# Patient Record
Sex: Female | Born: 1940
Health system: Southern US, Community
[De-identification: ages and names within clinical notes are randomized; demographics above are authoritative.]

## PROBLEM LIST (undated history)

## (undated) DIAGNOSIS — E785 Hyperlipidemia, unspecified: Secondary | ICD-10-CM

## (undated) DIAGNOSIS — F32A Depression, unspecified: Secondary | ICD-10-CM

## (undated) DIAGNOSIS — Z87442 Personal history of urinary calculi: Secondary | ICD-10-CM

## (undated) DIAGNOSIS — N8110 Cystocele, unspecified: Secondary | ICD-10-CM

## (undated) DIAGNOSIS — M8580 Other specified disorders of bone density and structure, unspecified site: Secondary | ICD-10-CM

## (undated) DIAGNOSIS — F329 Major depressive disorder, single episode, unspecified: Secondary | ICD-10-CM

## (undated) DIAGNOSIS — I10 Essential (primary) hypertension: Secondary | ICD-10-CM

## (undated) DIAGNOSIS — F419 Anxiety disorder, unspecified: Secondary | ICD-10-CM

## (undated) DIAGNOSIS — M791 Myalgia, unspecified site: Secondary | ICD-10-CM

## (undated) DIAGNOSIS — K5792 Diverticulitis of intestine, part unspecified, without perforation or abscess without bleeding: Secondary | ICD-10-CM

## (undated) DIAGNOSIS — K449 Diaphragmatic hernia without obstruction or gangrene: Secondary | ICD-10-CM

## (undated) DIAGNOSIS — B070 Plantar wart: Secondary | ICD-10-CM

## (undated) DIAGNOSIS — K7689 Other specified diseases of liver: Secondary | ICD-10-CM

## (undated) DIAGNOSIS — N281 Cyst of kidney, acquired: Secondary | ICD-10-CM

## (undated) DIAGNOSIS — M25511 Pain in right shoulder: Secondary | ICD-10-CM

## (undated) DIAGNOSIS — D049 Carcinoma in situ of skin, unspecified: Secondary | ICD-10-CM

## (undated) DIAGNOSIS — Z87448 Personal history of other diseases of urinary system: Secondary | ICD-10-CM

## (undated) DIAGNOSIS — M810 Age-related osteoporosis without current pathological fracture: Secondary | ICD-10-CM

## (undated) DIAGNOSIS — I482 Chronic atrial fibrillation, unspecified: Secondary | ICD-10-CM

## (undated) DIAGNOSIS — K5790 Diverticulosis of intestine, part unspecified, without perforation or abscess without bleeding: Secondary | ICD-10-CM

## (undated) DIAGNOSIS — K219 Gastro-esophageal reflux disease without esophagitis: Secondary | ICD-10-CM

## (undated) DIAGNOSIS — K589 Irritable bowel syndrome without diarrhea: Secondary | ICD-10-CM

## (undated) DIAGNOSIS — G4762 Sleep related leg cramps: Secondary | ICD-10-CM

## (undated) DIAGNOSIS — C4492 Squamous cell carcinoma of skin, unspecified: Secondary | ICD-10-CM

## (undated) DIAGNOSIS — M1990 Unspecified osteoarthritis, unspecified site: Secondary | ICD-10-CM

## (undated) DIAGNOSIS — Z8619 Personal history of other infectious and parasitic diseases: Secondary | ICD-10-CM

## (undated) HISTORY — DX: Essential (primary) hypertension: I10

## (undated) HISTORY — DX: Hyperlipidemia, unspecified: E78.5

## (undated) HISTORY — DX: Diverticulitis of intestine, part unspecified, without perforation or abscess without bleeding: K57.92

## (undated) HISTORY — DX: Carcinoma in situ of skin, unspecified: D04.9

## (undated) HISTORY — PX: SKIN CANCER EXCISION: SHX779

## (undated) HISTORY — DX: Diverticulosis of intestine, part unspecified, without perforation or abscess without bleeding: K57.90

## (undated) HISTORY — DX: Irritable bowel syndrome, unspecified: K58.9

## (undated) HISTORY — DX: Unspecified osteoarthritis, unspecified site: M19.90

## (undated) HISTORY — DX: Personal history of other infectious and parasitic diseases: Z86.19

## (undated) HISTORY — DX: Myalgia, unspecified site: M79.10

## (undated) HISTORY — DX: Cystocele, unspecified: N81.10

## (undated) HISTORY — DX: Cyst of kidney, acquired: N28.1

## (undated) HISTORY — DX: Personal history of other diseases of urinary system: Z87.448

## (undated) HISTORY — DX: Depression, unspecified: F32.A

## (undated) HISTORY — DX: Pain in right shoulder: M25.511

## (undated) HISTORY — DX: Squamous cell carcinoma of skin, unspecified: C44.92

## (undated) HISTORY — DX: Gastro-esophageal reflux disease without esophagitis: K21.9

## (undated) HISTORY — DX: Personal history of urinary calculi: Z87.442

## (undated) HISTORY — DX: Diaphragmatic hernia without obstruction or gangrene: K44.9

## (undated) HISTORY — DX: Chronic atrial fibrillation, unspecified: I48.20

## (undated) HISTORY — DX: Anxiety disorder, unspecified: F41.9

## (undated) HISTORY — DX: Other specified disorders of bone density and structure, unspecified site: M85.80

## (undated) HISTORY — DX: Plantar wart: B07.0

## (undated) HISTORY — DX: Other specified diseases of liver: K76.89

## (undated) HISTORY — DX: Major depressive disorder, single episode, unspecified: F32.9

## (undated) HISTORY — PX: SKIN BIOPSY: SHX1

## (undated) HISTORY — DX: Sleep related leg cramps: G47.62

## (undated) HISTORY — DX: Age-related osteoporosis without current pathological fracture: M81.0

## (undated) HISTORY — PX: TONSILLECTOMY: SUR1361

---

## 1998-02-13 ENCOUNTER — Ambulatory Visit (HOSPITAL_COMMUNITY): Admission: RE | Admit: 1998-02-13 | Discharge: 1998-02-13 | Payer: Self-pay | Admitting: Family Medicine

## 1998-03-07 ENCOUNTER — Ambulatory Visit (HOSPITAL_COMMUNITY): Admission: RE | Admit: 1998-03-07 | Discharge: 1998-03-07 | Payer: Self-pay | Admitting: Family Medicine

## 1999-04-11 ENCOUNTER — Ambulatory Visit (HOSPITAL_COMMUNITY): Admission: RE | Admit: 1999-04-11 | Discharge: 1999-04-11 | Payer: Self-pay

## 2000-11-18 ENCOUNTER — Ambulatory Visit (HOSPITAL_COMMUNITY): Admission: RE | Admit: 2000-11-18 | Discharge: 2000-11-18 | Payer: Self-pay | Admitting: Family Medicine

## 2001-06-03 ENCOUNTER — Ambulatory Visit (HOSPITAL_COMMUNITY): Admission: RE | Admit: 2001-06-03 | Discharge: 2001-06-03 | Payer: Self-pay | Admitting: Family Medicine

## 2001-06-03 ENCOUNTER — Encounter: Payer: Self-pay | Admitting: Family Medicine

## 2001-06-10 ENCOUNTER — Ambulatory Visit (HOSPITAL_COMMUNITY): Admission: RE | Admit: 2001-06-10 | Discharge: 2001-06-10 | Payer: Self-pay | Admitting: Family Medicine

## 2001-06-10 ENCOUNTER — Encounter: Payer: Self-pay | Admitting: Family Medicine

## 2001-07-06 ENCOUNTER — Encounter: Payer: Self-pay | Admitting: General Surgery

## 2001-07-06 ENCOUNTER — Ambulatory Visit (HOSPITAL_COMMUNITY): Admission: RE | Admit: 2001-07-06 | Discharge: 2001-07-06 | Payer: Self-pay | Admitting: General Surgery

## 2002-11-14 ENCOUNTER — Ambulatory Visit (HOSPITAL_COMMUNITY): Admission: RE | Admit: 2002-11-14 | Discharge: 2002-11-14 | Payer: Self-pay | Admitting: Family Medicine

## 2002-11-14 ENCOUNTER — Encounter: Payer: Self-pay | Admitting: Family Medicine

## 2002-12-13 ENCOUNTER — Encounter: Payer: Self-pay | Admitting: Family Medicine

## 2002-12-13 ENCOUNTER — Ambulatory Visit (HOSPITAL_COMMUNITY): Admission: RE | Admit: 2002-12-13 | Discharge: 2002-12-13 | Payer: Self-pay | Admitting: Family Medicine

## 2003-02-20 ENCOUNTER — Encounter: Payer: Self-pay | Admitting: General Surgery

## 2003-02-22 ENCOUNTER — Ambulatory Visit (HOSPITAL_COMMUNITY): Admission: RE | Admit: 2003-02-22 | Discharge: 2003-02-23 | Payer: Self-pay | Admitting: General Surgery

## 2003-02-22 ENCOUNTER — Encounter (INDEPENDENT_AMBULATORY_CARE_PROVIDER_SITE_OTHER): Payer: Self-pay | Admitting: Specialist

## 2003-02-22 ENCOUNTER — Encounter: Payer: Self-pay | Admitting: General Surgery

## 2003-06-30 HISTORY — PX: CHOLECYSTECTOMY: SHX55

## 2003-08-31 ENCOUNTER — Encounter: Admission: RE | Admit: 2003-08-31 | Discharge: 2003-08-31 | Payer: Self-pay | Admitting: Family Medicine

## 2004-08-03 ENCOUNTER — Emergency Department (HOSPITAL_COMMUNITY): Admission: EM | Admit: 2004-08-03 | Discharge: 2004-08-03 | Payer: Self-pay | Admitting: Emergency Medicine

## 2007-09-06 ENCOUNTER — Encounter: Admission: RE | Admit: 2007-09-06 | Discharge: 2007-09-06 | Payer: Self-pay | Admitting: Family Medicine

## 2007-09-09 ENCOUNTER — Ambulatory Visit (HOSPITAL_COMMUNITY): Admission: RE | Admit: 2007-09-09 | Discharge: 2007-09-09 | Payer: Self-pay | Admitting: Family Medicine

## 2008-09-18 ENCOUNTER — Ambulatory Visit (HOSPITAL_COMMUNITY): Admission: RE | Admit: 2008-09-18 | Discharge: 2008-09-18 | Payer: Self-pay | Admitting: Family Medicine

## 2008-10-27 LAB — HM COLONOSCOPY: HM Colonoscopy: NORMAL

## 2010-11-14 NOTE — Op Note (Signed)
Sophia [name], Sophia [name]                        ACCOUNT NO.:  [id number]   MEDICAL RECORD NO.:  [id number]                   PATIENT TYPE:  OIB   LOCATION:  2894                                 FACILITY:  [facility]   PHYSICIAN:  Adolph Pollack, M.D.            DATE OF BIRTH:  [birth year]   DATE OF PROCEDURE:  02/22/2003  DATE OF DISCHARGE:                                 OPERATIVE REPORT   PREOPERATIVE DIAGNOSIS:  Biliary dyskinesia.   POSTOPERATIVE DIAGNOSIS:  Chronic cholecystitis and biliary dyskinesia.   PROCEDURE:  Laparoscopic cholecystectomy with intraoperative cholangiogram.   SURGEON:  Adolph Pollack, M.D.   ASSISTANT:  Ollen Gross. Carolynne Edouard, M.D.   ANESTHESIA:  General.   FINDINGS:  On the intraoperative cholangiogram there appeared to be an  irregular defect in the lumen of the common hepatic duct, and this was  confirmed by Dr. Kearney Hard.  It appeared to be suspicious for sludge.  It was  not obstructive.   INDICATIONS:  This is a 70 year old female who is having some epigastric  type pains following foods, was given some Zantac about a year and a half  ago, and this had improved the symptoms somewhat; however, they have  returned.  Zantac no longer helps with these pains, and they are biliary  colic in nature by history.  She has had an ultrasound in the past, which  demonstrated some gallbladder sludge, and a HIDA scan consistent with some  biliary dyskinesia.  Liver function tests are normal.  She now presents for  elective cholecystectomy.  The procedure, rationale, and risks were  explained to her.   TECHNIQUE:  She is seen in the holding area, then brought to the operating  room, placed supine on the operating table, and a general anesthetic was  administered.  Her abdomen was sterilely prepped and draped.  Dilute  Marcaine solution was infiltrated into the subumbilical tissues and then a  small incision was made through the skin and subcutaneous tissue until the  fascia was identified.  A small incision was made in the midline fascia.  Using blunt dissection the peritoneal cavity was entered.  A pursestring  suture of 0 Vicryl was placed around the fascial edges.  A Hasson trocar is  introduced into the peritoneal cavity and a pneumoperitoneum was created by  insufflation of CO2 gas.  Next the laparoscope was introduced.  No  underlying visceral injury was noted.   She is placed in reverse Trendelenburg position in slight right lateral  decubitus.  An 11 mm trocar is placed through an epigastric incision and two  5 mm trocars were placed in the right midlateral abdomen.  There were filmy  adhesions between the omentum and the fundus and body of the gallbladder,  which were taken down bluntly.  The fundus was then able to be retracted  toward the right shoulder.  I continued to take down these filmy adhesions  between the omentum and the gallbladder until we could grasp the  infundibulum.  Using blunt dissection and Select cautery, I then mobilized  the infundibulum.  I identified the cystic duct and what appeared to be an  anterior branch of the cystic artery going directly to the gallbladder.  Using blunt dissection, a window was recreated around both of these.  A clip  was then placed above the cystic duct-gallbladder junction and I made a  small incision at the cystic duct-gallbladder junction.  A cholangiocatheter  was passed into the cystic duct and a cholangiogram performed.   Under real-time fluoroscopy dilute contrast material was injected into the  cystic duct.  It appeared to be a fairly long cystic duct.  The common bile  duct filled promptly and drained promptly into the duodenum.  The common  hepatic duct filled as well but had an irregular defect that was somewhat  mobile in it.  I flushed the biliary system with saline and repeated the  cholangiogram, and again the defect was noted.  I had Dr. Charlett Nose look  at it, and he saw the  defect as well and thought it was consistent with  sludge.  She did not have any evidence of obstruction of the biliary system.   I subsequently removed the Cholangiocath.  I clipped the cystic duct three  times on the staying-in side and dividing it.  The anterior branch of the  cystic artery was clipped and divided, and the posterior branch was  identified and clipped and divided.  I then dissected the gallbladder free  from the liver bed using electrocautery.  A small puncture wound was placed  in the gallbladder, and we had some bile leakage but no evidence of sludge  or stones.  Once the gallbladder was dissected free from the liver bed, it  was placed in an Endopouch bag.   Then I copiously irrigated out the gallbladder fossa.  Bleeding points were  controlled with cautery.  I inspected this multiple other times and  irrigated until the fluid was clear.  There was no evidence of bleeding or  bile leakage.   I then removed the gallbladder in the Endopouch bag through the subumbilical  port.  I irrigated the perihepatic area once again and evacuated the fluid,  and the fluid was clear.  Under direct vision I closed the subumbilical  fascial defect by tightening up and tying down the pursestring suture.  I  then removed the remaining trocars and released the pneumoperitoneum.  The  skin incisions were closed with 4-0 Monocryl subcuticular stitches, followed  by Steri-Strips and sterile dressings.  I then went to the back table and  cut open the gallbladder and saw no evidence of sludge or stones grossly.   She tolerated the procedure well without any apparent complications and was  taken to the recovery room in satisfactory condition.  The plan now will be  just to observe her and if she has any other symptoms suggestive of biliary  colic, we may need MRCP or ERCP in the postoperative period.                                              Adolph Pollack, M.D.    Kari Baars  D:   02/22/2003  T:  02/22/2003  Job:  [id number]   cc:  Talmadge Coventry, M.D.  [address]  [place]  [place] [address]  Fax: [phone]

## 2014-06-26 ENCOUNTER — Ambulatory Visit (INDEPENDENT_AMBULATORY_CARE_PROVIDER_SITE_OTHER): Payer: Commercial Managed Care - HMO | Admitting: Family Medicine

## 2014-06-26 ENCOUNTER — Encounter: Payer: Self-pay | Admitting: Family Medicine

## 2014-06-26 VITALS — BP 136/80 | HR 66 | Temp 97.7°F | Ht 60.75 in | Wt 133.8 lb

## 2014-06-26 DIAGNOSIS — C4492 Squamous cell carcinoma of skin, unspecified: Secondary | ICD-10-CM | POA: Insufficient documentation

## 2014-06-26 DIAGNOSIS — Z8619 Personal history of other infectious and parasitic diseases: Secondary | ICD-10-CM | POA: Insufficient documentation

## 2014-06-26 DIAGNOSIS — E782 Mixed hyperlipidemia: Secondary | ICD-10-CM

## 2014-06-26 DIAGNOSIS — M8580 Other specified disorders of bone density and structure, unspecified site: Secondary | ICD-10-CM

## 2014-06-26 DIAGNOSIS — F329 Major depressive disorder, single episode, unspecified: Secondary | ICD-10-CM | POA: Insufficient documentation

## 2014-06-26 DIAGNOSIS — F32A Depression, unspecified: Secondary | ICD-10-CM | POA: Insufficient documentation

## 2014-06-26 DIAGNOSIS — K219 Gastro-esophageal reflux disease without esophagitis: Secondary | ICD-10-CM | POA: Insufficient documentation

## 2014-06-26 DIAGNOSIS — M810 Age-related osteoporosis without current pathological fracture: Secondary | ICD-10-CM

## 2014-06-26 DIAGNOSIS — Z87442 Personal history of urinary calculi: Secondary | ICD-10-CM | POA: Insufficient documentation

## 2014-06-26 DIAGNOSIS — F419 Anxiety disorder, unspecified: Secondary | ICD-10-CM

## 2014-06-26 DIAGNOSIS — K5790 Diverticulosis of intestine, part unspecified, without perforation or abscess without bleeding: Secondary | ICD-10-CM | POA: Insufficient documentation

## 2014-06-26 DIAGNOSIS — D049 Carcinoma in situ of skin, unspecified: Secondary | ICD-10-CM | POA: Insufficient documentation

## 2014-06-26 DIAGNOSIS — E785 Hyperlipidemia, unspecified: Secondary | ICD-10-CM

## 2014-06-26 DIAGNOSIS — Z87448 Personal history of other diseases of urinary system: Secondary | ICD-10-CM | POA: Insufficient documentation

## 2014-06-26 DIAGNOSIS — I10 Essential (primary) hypertension: Secondary | ICD-10-CM | POA: Insufficient documentation

## 2014-06-26 DIAGNOSIS — M1990 Unspecified osteoarthritis, unspecified site: Secondary | ICD-10-CM | POA: Insufficient documentation

## 2014-06-26 HISTORY — DX: Age-related osteoporosis without current pathological fracture: M81.0

## 2014-06-26 HISTORY — DX: Other specified disorders of bone density and structure, unspecified site: M85.80

## 2014-06-26 MED ORDER — ALPRAZOLAM 0.25 MG PO TABS: 0.2500 mg | ORAL_TABLET | Freq: Every day | ORAL | Status: DC | PRN

## 2014-06-26 NOTE — Patient Instructions (Signed)

## 2014-06-26 NOTE — Progress Notes (Signed)
Pre visit review using our clinic review tool, if applicable. No additional management support is needed unless otherwise documented below in the visit note. 

## 2014-07-04 NOTE — Assessment & Plan Note (Signed)
Well controlled, no changes to meds. Encouraged heart healthy diet such as the DASH diet and exercise as tolerated.  °

## 2014-07-04 NOTE — Progress Notes (Signed)
Sophia [name]  [id number] 1941/06/03 07/04/2014      Progress Note-Follow Up  Subjective  Chief Complaint  Chief Complaint  Patient presents with  . Establish Care    bladder issues-pressure and freq urination-sxs last 6 mos    HPI  Patient is a 74 y.o. female in today for routine medical care. She is today to establish care. She is in her usual state of health and offers no acute concerns at this time. She is in need of a primary care physician. Recent illness. Denies CP/palp/SOB/HA/congestion/fevers/GI or GU c/o. Taking meds as prescribed  Past Medical History  Diagnosis Date  . Arthritis     hands, knees, etc  . GERD (gastroesophageal reflux disease)   . Hypertension   . Hyperlipidemia   . History of kidney stones   . H/O hematuria     for several years, work only revealed kidney stone on right side  . Diverticulitis   . History of chicken pox   . Anxiety and depression   . SCC (squamous cell carcinoma)     arms  . Bowen's disease   . Osteopenia 06/26/2014    Past Surgical History  Procedure Laterality Date  . Cholecystectomy  2005  . Skin biopsy      multiple    Family History  Problem Relation Age of Onset  . Heart disease Mother     aortic stenosis  . Hyperlipidemia Mother   . Hypertension Mother   . Heart attack Mother   . Cancer Mother     BCC, SCC, skin  . Hypertension Father   . Cancer Father     SCC, BCC, skin  . Early death Daughter   . Birth defects Maternal Uncle   . Heart disease Maternal Grandfather     MI  . Tuberculosis Paternal Grandmother   . Cancer Paternal Grandfather     head and neck cancer, chewed tobacco  . Hypertension Daughter   . Hypertension Brother   . Hyperlipidemia Brother     History   Social History  . Marital Status: Single    Spouse Name: N/A    Number of Children: N/A  . Years of Education: N/A   Occupational History  . Not on file.   Social History Main Topics  . Smoking status: Never Smoker     . Smokeless tobacco: Not on file  . Alcohol Use: No  . Drug Use: No  . Sexual Activity: No     Comment: divorced, widowed, lives with elderly parents, is the caregiver, no dietary restrictions.    Other Topics Concern  . Not on file   Social History Narrative  . No narrative on file    No current outpatient prescriptions on file prior to visit.   No current facility-administered medications on file prior to visit.    Allergies  Allergen Reactions  . Statins Other (See Comments)    Myalgias, weakness    Review of Systems  Review of Systems  Constitutional: Negative for fever, chills and malaise/fatigue.  HENT: Negative for congestion, hearing loss and nosebleeds.   Eyes: Negative for discharge.  Respiratory: Negative for cough, sputum production, shortness of breath and wheezing.   Cardiovascular: Negative for chest pain, palpitations and leg swelling.  Gastrointestinal: Negative for heartburn, nausea, vomiting, abdominal pain, diarrhea, constipation and blood in stool.  Genitourinary: Negative for dysuria, urgency, frequency and hematuria.  Musculoskeletal: Negative for myalgias, back pain and falls.  Skin: Negative for rash.  Neurological: Negative for dizziness, tremors, sensory change, focal weakness, loss of consciousness, weakness and headaches.  Endo/Heme/Allergies: Negative for polydipsia. Does not bruise/bleed easily.  Psychiatric/Behavioral: Positive for depression. Negative for suicidal ideas. The patient is nervous/anxious. The patient does not have insomnia.     Objective  BP 136/80 mmHg  Pulse 66  Temp(Src) 97.7 F (36.5 C) (Oral)  Ht 5' 0.75" (1.543 m)  Wt 133 lb 12.8 oz (60.691 kg)  BMI 25.49 kg/m2  SpO2 99%  Physical Exam  Physical Exam  Constitutional: She is oriented to person, place, and time and well-developed, well-nourished, and in no distress. No distress.  HENT:  Head: Normocephalic and atraumatic.  Right Ear: External ear normal.   Left Ear: External ear normal.  Nose: Nose normal.  Mouth/Throat: Oropharynx is clear and moist. No oropharyngeal exudate.  Eyes: Conjunctivae are normal. Pupils are equal, round, and reactive to light. Right eye exhibits no discharge. Left eye exhibits no discharge. No scleral icterus.  Neck: Normal range of motion. Neck supple. No thyromegaly present.  Cardiovascular: Normal rate, regular rhythm, normal heart sounds and intact distal pulses.   No murmur heard. Pulmonary/Chest: Effort normal and breath sounds normal. No respiratory distress. She has no wheezes. She has no rales.  Abdominal: Soft. Bowel sounds are normal. She exhibits no distension and no mass. There is no tenderness.  Musculoskeletal: Normal range of motion. She exhibits no edema or tenderness.  Lymphadenopathy:    She has no cervical adenopathy.  Neurological: She is alert and oriented to person, place, and time. She has normal reflexes. No cranial nerve deficit. Coordination normal.  Skin: Skin is warm and dry. No rash noted. She is not diaphoretic.  Psychiatric: Mood, memory and affect normal.    No results found for: TSH No results found for: WBC, HGB, HCT, MCV, PLT No results found for: CREATININE, BUN, NA, K, CL, CO2 No results found for: ALT, AST, GGT, ALKPHOS, BILITOT No results found for: CHOL No results found for: HDL No results found for: LDLCALC No results found for: TRIG No results found for: CHOLHDL   Assessment & Plan  Hypertension Well controlled, no changes to meds. Encouraged heart healthy diet such as the DASH diet and exercise as tolerated.   Hyperlipidemia Encouraged heart healthy diet, increase exercise, avoid trans fats, consider a krill oil cap daily  Osteopenia Continue Citracal and Vitamin D supplements.

## 2014-07-04 NOTE — Assessment & Plan Note (Signed)
Encouraged heart healthy diet, increase exercise, avoid trans fats, consider a krill oil cap daily 

## 2014-07-04 NOTE — Assessment & Plan Note (Signed)
Continue Citracal and Vitamin D supplements.

## 2014-08-10 ENCOUNTER — Telehealth: Payer: Self-pay | Admitting: Family Medicine

## 2014-08-10 MED ORDER — CITALOPRAM HYDROBROMIDE 10 MG PO TABS: 10.0000 mg | ORAL_TABLET | Freq: Every day | ORAL | Status: DC

## 2014-08-10 NOTE — Telephone Encounter (Signed)
Prescription sent in as requested and called the patient to inform prescription sent in as requested.

## 2014-08-10 NOTE — Telephone Encounter (Signed)
Caller name: Penina Relation to pt: Call back number: [phone] Pharmacy: [facility] on [address]  Reason for call:   Requesting refill of citalopram

## 2014-08-30 ENCOUNTER — Other Ambulatory Visit (INDEPENDENT_AMBULATORY_CARE_PROVIDER_SITE_OTHER): Payer: Commercial Managed Care - HMO

## 2014-08-30 DIAGNOSIS — I10 Essential (primary) hypertension: Secondary | ICD-10-CM | POA: Diagnosis not present

## 2014-08-30 DIAGNOSIS — M8580 Other specified disorders of bone density and structure, unspecified site: Secondary | ICD-10-CM

## 2014-08-30 DIAGNOSIS — E782 Mixed hyperlipidemia: Secondary | ICD-10-CM | POA: Diagnosis not present

## 2014-08-30 DIAGNOSIS — C4492 Squamous cell carcinoma of skin, unspecified: Secondary | ICD-10-CM

## 2014-08-30 LAB — COMPLETE METABOLIC PANEL WITH GFR
ALT: 16 U/L (ref 0–35)
AST: 22 U/L (ref 0–37)
Albumin: 4.3 g/dL (ref 3.5–5.2)
Alkaline Phosphatase: 58 U/L (ref 39–117)
BUN: 9 mg/dL (ref 6–23)
CALCIUM: 9.6 mg/dL (ref 8.4–10.5)
CHLORIDE: 103 meq/L (ref 96–112)
CO2: 27 meq/L (ref 19–32)
CREATININE: 0.74 mg/dL (ref 0.50–1.10)
GFR, Est Non African American: 81 mL/min
Glucose, Bld: 87 mg/dL (ref 70–99)
Potassium: 4.5 mEq/L (ref 3.5–5.3)
Sodium: 139 mEq/L (ref 135–145)
TOTAL PROTEIN: 6.4 g/dL (ref 6.0–8.3)
Total Bilirubin: 0.7 mg/dL (ref 0.2–1.2)

## 2014-08-30 LAB — LIPID PANEL
CHOLESTEROL: 242 mg/dL — AB (ref 0–200)
HDL: 60.2 mg/dL (ref >39.00)
LDL Cholesterol: 149 mg/dL — ABNORMAL HIGH (ref 0–99)
NonHDL: 181.8
Total CHOL/HDL Ratio: 4
Triglycerides: 163 mg/dL — ABNORMAL HIGH (ref 0.0–149.0)
VLDL: 32.6 mg/dL (ref 0.0–40.0)

## 2014-08-30 LAB — CBC
HEMATOCRIT: 38.6 % (ref 36.0–46.0)
Hemoglobin: 13.1 g/dL (ref 12.0–15.0)
MCHC: 33.9 g/dL (ref 30.0–36.0)
MCV: 91.7 fl (ref 78.0–100.0)
Platelets: 169 10*3/uL (ref 150.0–400.0)
RBC: 4.21 Mil/uL (ref 3.87–5.11)
RDW: 14.3 % (ref 11.5–15.5)
WBC: 6.8 10*3/uL (ref 4.0–10.5)

## 2014-08-30 LAB — TSH: TSH: 2.68 u[IU]/mL (ref 0.35–4.50)

## 2014-08-30 LAB — VITAMIN D 25 HYDROXY (VIT D DEFICIENCY, FRACTURES): VITD: 34.74 ng/mL (ref 30.00–100.00)

## 2014-09-06 ENCOUNTER — Ambulatory Visit: Payer: Commercial Managed Care - HMO | Admitting: Family Medicine

## 2014-09-10 ENCOUNTER — Encounter: Payer: Self-pay | Admitting: Family Medicine

## 2014-09-10 ENCOUNTER — Ambulatory Visit (INDEPENDENT_AMBULATORY_CARE_PROVIDER_SITE_OTHER): Payer: Commercial Managed Care - HMO | Admitting: Family Medicine

## 2014-09-10 VITALS — BP 118/80 | HR 76 | Temp 98.0°F | Ht 61.0 in | Wt 138.0 lb

## 2014-09-10 DIAGNOSIS — Z87442 Personal history of urinary calculi: Secondary | ICD-10-CM

## 2014-09-10 DIAGNOSIS — K219 Gastro-esophageal reflux disease without esophagitis: Secondary | ICD-10-CM | POA: Diagnosis not present

## 2014-09-10 DIAGNOSIS — Z87448 Personal history of other diseases of urinary system: Secondary | ICD-10-CM | POA: Diagnosis not present

## 2014-09-10 DIAGNOSIS — N8110 Cystocele, unspecified: Secondary | ICD-10-CM | POA: Diagnosis not present

## 2014-09-10 DIAGNOSIS — F329 Major depressive disorder, single episode, unspecified: Secondary | ICD-10-CM

## 2014-09-10 DIAGNOSIS — F418 Other specified anxiety disorders: Secondary | ICD-10-CM

## 2014-09-10 DIAGNOSIS — E785 Hyperlipidemia, unspecified: Secondary | ICD-10-CM

## 2014-09-10 DIAGNOSIS — I10 Essential (primary) hypertension: Secondary | ICD-10-CM

## 2014-09-10 DIAGNOSIS — F419 Anxiety disorder, unspecified: Secondary | ICD-10-CM

## 2014-09-10 MED ORDER — CITALOPRAM HYDROBROMIDE 10 MG PO TABS: 10.0000 mg | ORAL_TABLET | Freq: Every day | ORAL | Status: DC

## 2014-09-10 MED ORDER — CARVEDILOL 3.125 MG PO TABS: 3.1250 mg | ORAL_TABLET | Freq: Two times a day (BID) | ORAL | Status: DC

## 2014-09-10 NOTE — Progress Notes (Signed)
Pre visit review using our clinic review tool, if applicable. No additional management support is needed unless otherwise documented below in the visit note. 

## 2014-09-10 NOTE — Patient Instructions (Addendum)
Add a fiber supplement such as Metamucil or Benefiber  Add a probiotic, switch to Digestive Advantage or [employer]'s Colon Health   Hypertension Hypertension, commonly called high blood pressure, is when the force of blood pumping through your arteries is too strong. Your arteries are the blood vessels that carry blood from your heart throughout your body. A blood pressure reading consists of a higher number over a lower number, such as 110/72. The higher number (systolic) is the pressure inside your arteries when your heart pumps. The lower number (diastolic) is the pressure inside your arteries when your heart relaxes. Ideally you want your blood pressure below 120/80. Hypertension forces your heart to work harder to pump blood. Your arteries may become narrow or stiff. Having hypertension puts you at risk for heart disease, stroke, and other problems.  RISK FACTORS Some risk factors for high blood pressure are controllable. Others are not.  Risk factors you cannot control include:   Race. You may be at higher risk if you are African American.  Age. Risk increases with age.  Gender. Men are at higher risk than women before age 82 years. After age 64, women are at higher risk than men. Risk factors you can control include:  Not getting enough exercise or physical activity.  Being overweight.  Getting too much fat, sugar, calories, or salt in your diet.  Drinking too much alcohol. SIGNS AND SYMPTOMS Hypertension does not usually cause signs or symptoms. Extremely high blood pressure (hypertensive crisis) may cause headache, anxiety, shortness of breath, and nosebleed. DIAGNOSIS  To check if you have hypertension, your health care provider will measure your blood pressure while you are seated, with your arm held at the level of your heart. It should be measured at least twice using the same arm. Certain conditions can cause a difference in blood pressure between your right and left arms. A  blood pressure reading that is higher than normal on one occasion does not mean that you need treatment. If one blood pressure reading is high, ask your health care provider about having it checked again. TREATMENT  Treating high blood pressure includes making lifestyle changes and possibly taking medicine. Living a healthy lifestyle can help lower high blood pressure. You may need to change some of your habits. Lifestyle changes may include:  Following the DASH diet. This diet is high in fruits, vegetables, and whole grains. It is low in salt, red meat, and added sugars.  Getting at least 2 hours of brisk physical activity every week.  Losing weight if necessary.  Not smoking.  Limiting alcoholic beverages.  Learning ways to reduce stress. If lifestyle changes are not enough to get your blood pressure under control, your health care provider may prescribe medicine. You may need to take more than one. Work closely with your health care provider to understand the risks and benefits. HOME CARE INSTRUCTIONS  Have your blood pressure rechecked as directed by your health care provider.   Take medicines only as directed by your health care provider. Follow the directions carefully. Blood pressure medicines must be taken as prescribed. The medicine does not work as well when you skip doses. Skipping doses also puts you at risk for problems.   Do not smoke.   Monitor your blood pressure at home as directed by your health care provider. SEEK MEDICAL CARE IF:   You think you are having a reaction to medicines taken.  You have recurrent headaches or feel dizzy.  You have swelling  in your ankles.  You have trouble with your vision. SEEK IMMEDIATE MEDICAL CARE IF:  You develop a severe headache or confusion.  You have unusual weakness, numbness, or feel faint.  You have severe chest or abdominal pain.  You vomit repeatedly.  You have trouble breathing. MAKE SURE YOU:    Understand these instructions.  Will watch your condition.  Will get help right away if you are not doing well or get worse. Document Released: 06/15/2005 Document Revised: 10/30/2013 Document Reviewed: 04/07/2013 [facility] Patient Information 2015 [place], [place]. This information is not intended to replace advice given to you by your health care provider. Make sure you discuss any questions you have with your health care provider.

## 2014-09-11 ENCOUNTER — Other Ambulatory Visit: Payer: Self-pay | Admitting: Family Medicine

## 2014-09-16 ENCOUNTER — Encounter: Payer: Self-pay | Admitting: Family Medicine

## 2014-09-16 DIAGNOSIS — N8110 Cystocele, unspecified: Secondary | ICD-10-CM

## 2014-09-16 HISTORY — DX: Cystocele, unspecified: N81.10

## 2014-09-16 NOTE — Assessment & Plan Note (Signed)
Patient reports 20 year plus history of hematuria.

## 2014-09-16 NOTE — Assessment & Plan Note (Signed)
Symptomatic with lifting and bending. Discussed possibility of referral to urology or gyn for pessary but declines for now.

## 2014-09-16 NOTE — Assessment & Plan Note (Signed)
Avoid offending foods, start probiotics. Do not eat large meals in late evening and consider raising head of bed.  

## 2014-09-16 NOTE — Assessment & Plan Note (Signed)
Well controlled, no changes to meds. Encouraged heart healthy diet such as the DASH diet and exercise as tolerated.  °

## 2014-09-16 NOTE — Progress Notes (Signed)
Sophia [name]  [id number] 08/04/1940 09/16/2014      Progress Note-Follow Up  Subjective  Chief Complaint  Chief Complaint  Patient presents with  . Follow-up    HPI  Patient is a 75 y.o. female in today for routine medical care. Patient is in today for follow-up. Feels well. No recent illness. Feels the Celexa has been helpful. She has not needed any of the alprazolam. She denies any suicidal ideation. Her major complaint is of recurrent trouble with bladder prolapse. She notes feeling something protruding when she lifts and bends. She denies any dysuria with certain position she has trouble initiating urine. This is not ongoing and corrects with position changes. No recent illness or fevers. Denies CP/palp/SOB/HA/congestion/fevers/GI or GU c/o. Taking meds as prescribed  Past Medical History  Diagnosis Date  . Arthritis     hands, knees, etc  . GERD (gastroesophageal reflux disease)   . Hypertension   . Hyperlipidemia   . History of kidney stones   . H/O hematuria     for several years, work only revealed kidney stone on right side  . Diverticulitis   . History of chicken pox   . Anxiety and depression   . SCC (squamous cell carcinoma)     arms  . Bowen's disease   . Osteopenia 06/26/2014  . Bladder prolapse, female, acquired 09/16/2014    Past Surgical History  Procedure Laterality Date  . Cholecystectomy  2005  . Skin biopsy      multiple    Family History  Problem Relation Age of Onset  . Heart disease Mother     aortic stenosis  . Hyperlipidemia Mother   . Hypertension Mother   . Heart attack Mother   . Cancer Mother     BCC, SCC, skin  . Hypertension Father   . Cancer Father     SCC, BCC, skin  . Early death Daughter   . Birth defects Maternal Uncle   . Heart disease Maternal Grandfather     MI  . Tuberculosis Paternal Grandmother   . Cancer Paternal Grandfather     head and neck cancer, chewed tobacco  . Hypertension Daughter   .  Hypertension Brother   . Hyperlipidemia Brother     History   Social History  . Marital Status: Single    Spouse Name: N/A  . Number of Children: N/A  . Years of Education: N/A   Occupational History  . Not on file.   Social History Main Topics  . Smoking status: Never Smoker   . Smokeless tobacco: Not on file  . Alcohol Use: No  . Drug Use: No  . Sexual Activity: No     Comment: divorced, widowed, lives with elderly parents, is the caregiver, no dietary restrictions.    Other Topics Concern  . Not on file   Social History Narrative    Current Outpatient Prescriptions on File Prior to Visit  Medication Sig Dispense Refill  . aspirin 81 MG tablet Take 81 mg by mouth 3 (three) times a week.    . B Complex-C (SUPER B COMPLEX) TABS Take 1 tablet by mouth daily.    . Coenzyme Q10 (COQ-10) 200 MG CAPS Take 1 capsule by mouth daily.    . Omega-3 Fatty Acids (FISH OIL) 1200 MG CAPS Take 1 capsule by mouth 2 (two) times daily.    [name] OVER THE COUNTER MEDICATION Citrical with Vit D3 630mg /500IU-Take 1 capsule by mouth daily.    [name]  Red Yeast Rice 600 MG CAPS Take 1 capsule by mouth daily.    [name] ALPRAZolam (XANAX) 0.25 MG tablet Take 1 tablet (0.25 mg total) by mouth daily as needed for anxiety or sleep. (Patient not taking: Reported on 09/10/2014) 30 tablet 1   No current facility-administered medications on file prior to visit.    Allergies  Allergen Reactions  . Morphine And Related Swelling  . Statins Other (See Comments)    Myalgias, weakness  . Sulfa Antibiotics Swelling and Rash    Review of Systems  Review of Systems  Constitutional: Negative for fever and malaise/fatigue.  HENT: Negative for congestion.   Eyes: Negative for discharge.  Respiratory: Negative for shortness of breath.   Cardiovascular: Negative for chest pain, palpitations and leg swelling.  Gastrointestinal: Negative for nausea, abdominal pain and diarrhea.  Genitourinary: Positive for urgency and  frequency. Negative for dysuria, hematuria and flank pain.  Musculoskeletal: Negative for falls.  Skin: Negative for rash.  Neurological: Negative for loss of consciousness and headaches.  Endo/Heme/Allergies: Negative for polydipsia.  Psychiatric/Behavioral: Negative for depression and suicidal ideas. The patient is not nervous/anxious and does not have insomnia.     Objective  BP 118/80 mmHg  Pulse 76  Temp(Src) 98 F (36.7 C) (Oral)  Ht 5\' 1"  (1.549 m)  Wt 138 lb (62.596 kg)  BMI 26.09 kg/m2  SpO2 95%  Physical Exam  Physical Exam  Constitutional: She is oriented to person, place, and time and well-developed, well-nourished, and in no distress. No distress.  HENT:  Head: Normocephalic and atraumatic.  Eyes: Conjunctivae are normal.  Neck: Neck supple. No thyromegaly present.  Cardiovascular: Normal rate, regular rhythm and normal heart sounds.   No murmur heard. Pulmonary/Chest: Effort normal and breath sounds normal. She has no wheezes.  Abdominal: She exhibits no distension and no mass.  Musculoskeletal: She exhibits no edema.  Lymphadenopathy:    She has no cervical adenopathy.  Neurological: She is alert and oriented to person, place, and time.  Skin: Skin is warm and dry. No rash noted. She is not diaphoretic.  Psychiatric: Memory, affect and judgment normal.    Lab Results  Component Value Date   TSH 2.68 08/30/2014   Lab Results  Component Value Date   WBC 6.8 08/30/2014   HGB 13.1 08/30/2014   HCT 38.6 08/30/2014   MCV 91.7 08/30/2014   PLT 169.0 08/30/2014   Lab Results  Component Value Date   CREATININE 0.74 08/30/2014   BUN 9 08/30/2014   NA 139 08/30/2014   K 4.5 08/30/2014   CL 103 08/30/2014   CO2 27 08/30/2014   Lab Results  Component Value Date   ALT 16 08/30/2014   AST 22 08/30/2014   ALKPHOS 58 08/30/2014   BILITOT 0.7 08/30/2014   Lab Results  Component Value Date   CHOL 242* 08/30/2014   Lab Results  Component Value Date     HDL 60.20 08/30/2014   Lab Results  Component Value Date   LDLCALC 149* 08/30/2014   Lab Results  Component Value Date   TRIG 163.0* 08/30/2014   Lab Results  Component Value Date   CHOLHDL 4 08/30/2014     Assessment & Plan  Hypertension Well controlled, no changes to meds. Encouraged heart healthy diet such as the DASH diet and exercise as tolerated.    Anxiety and depression Celexa 10 mg daily, doing well   Hyperlipidemia Encouraged heart healthy diet, increase exercise, avoid trans fats, consider a krill  oil cap daily. May continue Red Yeast Rice and may need to consider statins in the future   GERD (gastroesophageal reflux disease) Avoid offending foods, start probiotics. Do not eat large meals in late evening and consider raising head of bed.    History of kidney stones Staying well hydrated, no recent symptoms   H/O hematuria Patient reports 20 year plus history of hematuria.    Bladder prolapse, female, acquired Symptomatic with lifting and bending. Discussed possibility of referral to urology or gyn for pessary but declines for now.

## 2014-09-16 NOTE — Assessment & Plan Note (Signed)
Staying well hydrated, no recent symptoms

## 2014-09-16 NOTE — Assessment & Plan Note (Signed)
Celexa 10 mg daily, doing well

## 2014-09-16 NOTE — Assessment & Plan Note (Signed)
Encouraged heart healthy diet, increase exercise, avoid trans fats, consider a krill oil cap daily. May continue Red Yeast Rice and may need to consider statins in the future

## 2014-09-27 DIAGNOSIS — N814 Uterovaginal prolapse, unspecified: Secondary | ICD-10-CM | POA: Diagnosis not present

## 2014-09-27 DIAGNOSIS — N8111 Cystocele, midline: Secondary | ICD-10-CM | POA: Diagnosis not present

## 2014-10-12 DIAGNOSIS — N814 Uterovaginal prolapse, unspecified: Secondary | ICD-10-CM | POA: Diagnosis not present

## 2014-10-12 DIAGNOSIS — N8111 Cystocele, midline: Secondary | ICD-10-CM | POA: Diagnosis not present

## 2014-11-05 ENCOUNTER — Other Ambulatory Visit: Payer: Self-pay | Admitting: Family Medicine

## 2014-11-05 MED ORDER — CARVEDILOL 3.125 MG PO TABS: 3.1250 mg | ORAL_TABLET | Freq: Two times a day (BID) | ORAL | Status: DC

## 2014-11-12 DIAGNOSIS — L851 Acquired keratosis [keratoderma] palmaris et plantaris: Secondary | ICD-10-CM | POA: Diagnosis not present

## 2014-11-12 DIAGNOSIS — D234 Other benign neoplasm of skin of scalp and neck: Secondary | ICD-10-CM | POA: Diagnosis not present

## 2014-12-13 ENCOUNTER — Ambulatory Visit (INDEPENDENT_AMBULATORY_CARE_PROVIDER_SITE_OTHER): Payer: Commercial Managed Care - HMO | Admitting: Family Medicine

## 2014-12-13 ENCOUNTER — Encounter: Payer: Self-pay | Admitting: Family Medicine

## 2014-12-13 VITALS — BP 110/78 | HR 76 | Temp 98.1°F | Ht 61.0 in | Wt 139.5 lb

## 2014-12-13 DIAGNOSIS — I10 Essential (primary) hypertension: Secondary | ICD-10-CM | POA: Diagnosis not present

## 2014-12-13 DIAGNOSIS — E782 Mixed hyperlipidemia: Secondary | ICD-10-CM

## 2014-12-13 DIAGNOSIS — M8580 Other specified disorders of bone density and structure, unspecified site: Secondary | ICD-10-CM | POA: Diagnosis not present

## 2014-12-13 DIAGNOSIS — D049 Carcinoma in situ of skin, unspecified: Secondary | ICD-10-CM

## 2014-12-13 DIAGNOSIS — E785 Hyperlipidemia, unspecified: Secondary | ICD-10-CM

## 2014-12-13 DIAGNOSIS — K219 Gastro-esophageal reflux disease without esophagitis: Secondary | ICD-10-CM

## 2014-12-13 MED ORDER — CITALOPRAM HYDROBROMIDE 10 MG PO TABS: 10.0000 mg | ORAL_TABLET | Freq: Every day | ORAL | Status: DC

## 2014-12-13 MED ORDER — CARVEDILOL 6.25 MG PO TABS: 6.2500 mg | ORAL_TABLET | Freq: Two times a day (BID) | ORAL | Status: DC

## 2014-12-13 NOTE — Patient Instructions (Signed)

## 2014-12-13 NOTE — Progress Notes (Signed)
Pre visit review using our clinic review tool, if applicable. No additional management support is needed unless otherwise documented below in the visit note. 

## 2014-12-13 NOTE — Progress Notes (Signed)
Sophia [name]  [id number] 11/10/1940 12/13/2014      Progress Note-Follow Up  Subjective  Chief Complaint  Chief Complaint  Patient presents with  . Follow-up    3 month    HPI  Patient is a 74 y.o. female in today for routine medical care. Patient is in today for follow-up generally doing well. No recent illness. Has not had frequent episodes of palpitations but has had a few since her last visit. No associated symptoms such as chest pain or shortness of breath. No recent illness or acute concerns. Denies CP/SOB/HA/congestion/fevers/GI or GU c/o. Taking meds as prescribed  Past Medical History  Diagnosis Date  . Arthritis     hands, knees, etc  . GERD (gastroesophageal reflux disease)   . Hypertension   . Hyperlipidemia   . History of kidney stones   . H/O hematuria     for several years, work only revealed kidney stone on right side  . Diverticulitis   . History of chicken pox   . Anxiety and depression   . SCC (squamous cell carcinoma)     arms  . Bowen's disease   . Osteopenia 06/26/2014  . Bladder prolapse, female, acquired 09/16/2014    Past Surgical History  Procedure Laterality Date  . Cholecystectomy  2005  . Skin biopsy      multiple    Family History  Problem Relation Age of Onset  . Heart disease Mother     aortic stenosis  . Hyperlipidemia Mother   . Hypertension Mother   . Heart attack Mother   . Cancer Mother     BCC, SCC, skin  . Hypertension Father   . Cancer Father     SCC, BCC, skin  . Early death Daughter   . Birth defects Maternal Uncle   . Heart disease Maternal Grandfather     MI  . Tuberculosis Paternal Grandmother   . Cancer Paternal Grandfather     head and neck cancer, chewed tobacco  . Hypertension Daughter   . Hypertension Brother   . Hyperlipidemia Brother     History   Social History  . Marital Status: Single    Spouse Name: N/A  . Number of Children: N/A  . Years of Education: N/A   Occupational History   . Not on file.   Social History Main Topics  . Smoking status: Never Smoker   . Smokeless tobacco: Not on file  . Alcohol Use: No  . Drug Use: No  . Sexual Activity: No     Comment: divorced, widowed, lives with elderly parents, is the caregiver, no dietary restrictions.    Other Topics Concern  . Not on file   Social History Narrative    Current Outpatient Prescriptions on File Prior to Visit  Medication Sig Dispense Refill  . ALPRAZolam (XANAX) 0.25 MG tablet Take 1 tablet (0.25 mg total) by mouth daily as needed for anxiety or sleep. 30 tablet 1  . aspirin 81 MG tablet Take 81 mg by mouth 3 (three) times a week.    . B Complex-C (SUPER B COMPLEX) TABS Take 1 tablet by mouth daily.    . carvedilol (COREG) 3.125 MG tablet Take 1 tablet (3.125 mg total) by mouth 2 (two) times daily with a meal. 180 tablet 1  . Coenzyme Q10 (COQ-10) 200 MG CAPS Take 1 capsule by mouth daily.    . Omega-3 Fatty Acids (FISH OIL) 1200 MG CAPS Take 1 capsule by mouth 2 (  two) times daily.    [name] OVER THE COUNTER MEDICATION Citrical with Vit D3 630mg /500IU-Take 1 capsule by mouth daily.    . Red Yeast Rice 600 MG CAPS Take 1 capsule by mouth daily.     No current facility-administered medications on file prior to visit.    Allergies  Allergen Reactions  . Morphine And Related Swelling  . Statins Other (See Comments)    Myalgias, weakness  . Sulfa Antibiotics Swelling and Rash    Review of Systems  Review of Systems  Constitutional: Negative for fever and malaise/fatigue.  HENT: Negative for congestion.   Eyes: Negative for discharge.  Respiratory: Negative for shortness of breath.   Cardiovascular: Negative for chest pain, palpitations and leg swelling.  Gastrointestinal: Negative for nausea, abdominal pain and diarrhea.  Genitourinary: Negative for dysuria.  Musculoskeletal: Negative for falls.  Skin: Negative for rash.  Neurological: Negative for loss of consciousness and headaches.    Endo/Heme/Allergies: Negative for polydipsia.  Psychiatric/Behavioral: Negative for depression and suicidal ideas. The patient is not nervous/anxious and does not have insomnia.     Objective  BP 110/78 mmHg  Pulse 76  Temp(Src) 98.1 F (36.7 C) (Oral)  Ht 5\' 1"  (1.549 m)  Wt 139 lb 8 oz (63.277 kg)  BMI 26.37 kg/m2  SpO2 97%  Physical Exam  Physical Exam  Constitutional: She is oriented to person, place, and time and well-developed, well-nourished, and in no distress. No distress.  HENT:  Head: Normocephalic and atraumatic.  Eyes: Conjunctivae are normal.  Neck: Neck supple. No thyromegaly present.  Cardiovascular: Normal rate, regular rhythm and normal heart sounds.   No murmur heard. Pulmonary/Chest: Effort normal and breath sounds normal. She has no wheezes.  Abdominal: She exhibits no distension and no mass.  Musculoskeletal: She exhibits no edema.  Lymphadenopathy:    She has no cervical adenopathy.  Neurological: She is alert and oriented to person, place, and time.  Skin: Skin is warm and dry. No rash noted. She is not diaphoretic.  Psychiatric: Memory, affect and judgment normal.    Lab Results  Component Value Date   TSH 2.68 08/30/2014   Lab Results  Component Value Date   WBC 6.8 08/30/2014   HGB 13.1 08/30/2014   HCT 38.6 08/30/2014   MCV 91.7 08/30/2014   PLT 169.0 08/30/2014   Lab Results  Component Value Date   CREATININE 0.74 08/30/2014   BUN 9 08/30/2014   NA 139 08/30/2014   K 4.5 08/30/2014   CL 103 08/30/2014   CO2 27 08/30/2014   Lab Results  Component Value Date   ALT 16 08/30/2014   AST 22 08/30/2014   ALKPHOS 58 08/30/2014   BILITOT 0.7 08/30/2014   Lab Results  Component Value Date   CHOL 242* 08/30/2014   Lab Results  Component Value Date   HDL 60.20 08/30/2014   Lab Results  Component Value Date   LDLCALC 149* 08/30/2014   Lab Results  Component Value Date   TRIG 163.0* 08/30/2014   Lab Results  Component  Value Date   CHOLHDL 4 08/30/2014     Assessment & Plan  Hypertension Well controlled, no changes to meds. Encouraged heart healthy diet such as the DASH diet and exercise as tolerated.   GERD (gastroesophageal reflux disease) Avoid offending foods, start probiotics. Do not eat large meals in late evening and consider raising head of bed.   Osteopenia Encouraged vitamin d and calcium supplements and regular exercise  Hyperlipidemia Encouraged  heart healthy diet, increase exercise, avoid trans fats, consider a krill oil cap daily. Declines statin, uses red yeast rice  Bowen's disease Follows with dermatology, no new lesions

## 2014-12-16 NOTE — Assessment & Plan Note (Signed)
Avoid offending foods, start probiotics. Do not eat large meals in late evening and consider raising head of bed.  

## 2014-12-16 NOTE — Assessment & Plan Note (Signed)
Follows with dermatology, no new lesions

## 2014-12-16 NOTE — Assessment & Plan Note (Signed)
Encouraged heart healthy diet, increase exercise, avoid trans fats, consider a krill oil cap daily. Declines statin, uses red yeast rice

## 2014-12-16 NOTE — Assessment & Plan Note (Signed)
Well controlled, no changes to meds. Encouraged heart healthy diet such as the DASH diet and exercise as tolerated.  °

## 2014-12-16 NOTE — Assessment & Plan Note (Signed)
Encouraged vitamin d and calcium supplements and regular exercise

## 2014-12-26 DIAGNOSIS — N814 Uterovaginal prolapse, unspecified: Secondary | ICD-10-CM | POA: Diagnosis not present

## 2014-12-26 DIAGNOSIS — N8111 Cystocele, midline: Secondary | ICD-10-CM | POA: Diagnosis not present

## 2014-12-28 DIAGNOSIS — L821 Other seborrheic keratosis: Secondary | ICD-10-CM | POA: Diagnosis not present

## 2015-01-28 ENCOUNTER — Telehealth: Payer: Self-pay | Admitting: Family Medicine

## 2015-01-28 NOTE — Telephone Encounter (Signed)
Requesting: Alprazolam Contract None UDS  None Last OV  12/13/14 Last Refill  #30 with 0 refills on 06/26/14  Please Advise

## 2015-01-28 NOTE — Telephone Encounter (Signed)
OK to refill Alprazolam with same sig, same number, no rf

## 2015-01-29 MED ORDER — ALPRAZOLAM 0.25 MG PO TABS: 0.2500 mg | ORAL_TABLET | Freq: Every day | ORAL | Status: DC | PRN

## 2015-01-29 NOTE — Telephone Encounter (Signed)
Printed and on counter for signature from PCP

## 2015-01-29 NOTE — Telephone Encounter (Signed)
Once hardcopy is signed will fax hardcopy to [facility] [place].

## 2015-02-14 DIAGNOSIS — N8111 Cystocele, midline: Secondary | ICD-10-CM | POA: Diagnosis not present

## 2015-02-20 DIAGNOSIS — N814 Uterovaginal prolapse, unspecified: Secondary | ICD-10-CM | POA: Diagnosis not present

## 2015-03-12 ENCOUNTER — Other Ambulatory Visit (INDEPENDENT_AMBULATORY_CARE_PROVIDER_SITE_OTHER): Payer: Commercial Managed Care - HMO

## 2015-03-12 ENCOUNTER — Other Ambulatory Visit: Payer: Commercial Managed Care - HMO

## 2015-03-12 DIAGNOSIS — I10 Essential (primary) hypertension: Secondary | ICD-10-CM

## 2015-03-12 DIAGNOSIS — E782 Mixed hyperlipidemia: Secondary | ICD-10-CM | POA: Diagnosis not present

## 2015-03-12 LAB — CBC
HEMATOCRIT: 37 % (ref 36.0–46.0)
HEMOGLOBIN: 12.3 g/dL (ref 12.0–15.0)
MCHC: 33.3 g/dL (ref 30.0–36.0)
MCV: 94.1 fl (ref 78.0–100.0)
PLATELETS: 183 10*3/uL (ref 150.0–400.0)
RBC: 3.93 Mil/uL (ref 3.87–5.11)
RDW: 14.3 % (ref 11.5–15.5)
WBC: 7.5 10*3/uL (ref 4.0–10.5)

## 2015-03-12 LAB — LIPID PANEL
CHOL/HDL RATIO: 4
Cholesterol: 213 mg/dL — ABNORMAL HIGH (ref 0–200)
HDL: 51.7 mg/dL (ref >39.00)
LDL Cholesterol: 130 mg/dL — ABNORMAL HIGH (ref 0–99)
NONHDL: 161.55
Triglycerides: 158 mg/dL — ABNORMAL HIGH (ref 0.0–149.0)
VLDL: 31.6 mg/dL (ref 0.0–40.0)

## 2015-03-12 LAB — COMPREHENSIVE METABOLIC PANEL
ALT: 50 U/L — ABNORMAL HIGH (ref 0–35)
AST: 25 U/L (ref 0–37)
Albumin: 4.2 g/dL (ref 3.5–5.2)
Alkaline Phosphatase: 61 U/L (ref 39–117)
BUN: 10 mg/dL (ref 6–23)
CHLORIDE: 104 meq/L (ref 96–112)
CO2: 25 meq/L (ref 19–32)
Calcium: 9.3 mg/dL (ref 8.4–10.5)
Creatinine, Ser: 0.72 mg/dL (ref 0.40–1.20)
GFR: 84.44 mL/min (ref >60.00)
GLUCOSE: 86 mg/dL (ref 70–99)
POTASSIUM: 4.2 meq/L (ref 3.5–5.1)
SODIUM: 140 meq/L (ref 135–145)
Total Bilirubin: 0.6 mg/dL (ref 0.2–1.2)
Total Protein: 6.6 g/dL (ref 6.0–8.3)

## 2015-03-12 LAB — TSH: TSH: 1.22 u[IU]/mL (ref 0.35–4.50)

## 2015-03-19 ENCOUNTER — Encounter: Payer: Self-pay | Admitting: Family Medicine

## 2015-03-19 ENCOUNTER — Ambulatory Visit (INDEPENDENT_AMBULATORY_CARE_PROVIDER_SITE_OTHER): Payer: Commercial Managed Care - HMO | Admitting: Family Medicine

## 2015-03-19 VITALS — BP 140/75 | HR 73 | Temp 98.2°F | Ht 61.0 in | Wt 139.4 lb

## 2015-03-19 DIAGNOSIS — I10 Essential (primary) hypertension: Secondary | ICD-10-CM | POA: Diagnosis not present

## 2015-03-19 DIAGNOSIS — Z23 Encounter for immunization: Secondary | ICD-10-CM

## 2015-03-19 DIAGNOSIS — K219 Gastro-esophageal reflux disease without esophagitis: Secondary | ICD-10-CM | POA: Diagnosis not present

## 2015-03-19 DIAGNOSIS — E785 Hyperlipidemia, unspecified: Secondary | ICD-10-CM | POA: Diagnosis not present

## 2015-03-19 NOTE — Progress Notes (Signed)
Pre visit review using our clinic review tool, if applicable. No additional management support is needed unless otherwise documented below in the visit note. 

## 2015-03-19 NOTE — Patient Instructions (Addendum)
Call your insurance and ask if they are willing to pay for Tdap or Td Will they pay for Prevnar or Zostavax (shingles)  Cholesterol Cholesterol is a white, waxy, fat-like substance needed by your body in small amounts. The liver makes all the cholesterol you need. Cholesterol is carried from the liver by the blood through the blood vessels. Deposits of cholesterol (plaque) may build up on blood vessel walls. These make the arteries narrower and stiffer. Cholesterol plaques increase the risk for heart attack and stroke.  You cannot feel your cholesterol level even if it is very high. The only way to know it is high is with a blood test. Once you know your cholesterol levels, you should keep a record of the test results. Work with your health care provider to keep your levels in the desired range.  WHAT DO THE RESULTS MEAN?  Total cholesterol is a rough measure of all the cholesterol in your blood.   LDL is the so-called bad cholesterol. This is the type that deposits cholesterol in the walls of the arteries. You want this level to be low.   HDL is the good cholesterol because it cleans the arteries and carries the LDL away. You want this level to be high.  Triglycerides are fat that the body can either burn for energy or store. High levels are closely linked to heart disease.  WHAT ARE THE DESIRED LEVELS OF CHOLESTEROL?  Total cholesterol below 200.   LDL below 100 for people at risk, below 70 for those at very high risk.   HDL above 50 is good, above 60 is best.   Triglycerides below 150.  HOW CAN I LOWER MY CHOLESTEROL?  Diet. Follow your diet programs as directed by your health care provider.   Choose fish or white meat chicken and [place], roasted or baked. Limit fatty cuts of red meat, fried foods, and processed meats, such as sausage and lunch meats.   Eat lots of fresh fruits and vegetables.  Choose whole grains, beans, pasta, potatoes, and cereals.   Use only small  amounts of olive, corn, or canola oils.   Avoid butter, mayonnaise, shortening, or palm kernel oils.  Avoid foods with trans fats.   Drink skim or nonfat milk and eat low-fat or nonfat yogurt and cheeses. Avoid whole milk, cream, ice cream, egg yolks, and full-fat cheeses.   Healthy desserts include angel food cake, ginger snaps, animal crackers, hard candy, popsicles, and low-fat or nonfat frozen yogurt. Avoid pastries, cakes, pies, and cookies.   Exercise. Follow your exercise programs as directed by your health care provider.   A regular program helps decrease LDL and raise HDL.   A regular program helps with weight control.   Do things that increase your activity level like gardening, walking, or taking the stairs. Ask your health care provider about how you can be more active in your daily life.   Medicine. Take medicine only as directed by your health care provider.   Medicine may be prescribed by your health care provider to help lower cholesterol and decrease the risk for heart disease.   If you have several risk factors, you may need medicine even if your levels are normal. Document Released: 03/10/2001 Document Revised: 10/30/2013 Document Reviewed: 03/29/2013 [facility] Patient Information 2015 [place], [place]. This information is not intended to replace advice given to you by your health care provider. Make sure you discuss any questions you have with your health care provider.

## 2015-03-24 ENCOUNTER — Encounter: Payer: Self-pay | Admitting: Family Medicine

## 2015-03-24 DIAGNOSIS — Z0000 Encounter for general adult medical examination without abnormal findings: Secondary | ICD-10-CM | POA: Insufficient documentation

## 2015-03-24 NOTE — Assessment & Plan Note (Signed)
Given flu shot  today 

## 2015-03-24 NOTE — Assessment & Plan Note (Signed)
Encouraged heart healthy diet, increase exercise, avoid trans fats, consider a krill oil cap daily 

## 2015-03-24 NOTE — Progress Notes (Signed)
Subjective:    Patient ID: Sophia [name], female    DOB: 08-17-1940, 74 y.o.   MRN: [id number]  Chief Complaint  Patient presents with  . Follow-up    HPI Patient is in today for follow-up. Generally doing well. No recent illness. Denies any acute complaints. Reports last colonoscopy was back in 2010 and last tetanus shot was 2 years or more. Denies CP/palp/SOB/HA/congestion/fevers/GI or GU c/o. Taking meds as prescribed  Past Medical History  Diagnosis Date  . Arthritis     hands, knees, etc  . GERD (gastroesophageal reflux disease)   . Hypertension   . Hyperlipidemia   . History of kidney stones   . H/O hematuria     for several years, work only revealed kidney stone on right side  . Diverticulitis   . History of chicken pox   . Anxiety and depression   . SCC (squamous cell carcinoma)     arms  . Bowen's disease   . Osteopenia 06/26/2014  . Bladder prolapse, female, acquired 09/16/2014    Past Surgical History  Procedure Laterality Date  . Cholecystectomy  2005  . Skin biopsy      multiple    Family History  Problem Relation Age of Onset  . Heart disease Mother     aortic stenosis  . Hyperlipidemia Mother   . Hypertension Mother   . Heart attack Mother   . Cancer Mother     BCC, SCC, skin  . Hypertension Father   . Cancer Father     SCC, BCC, skin  . Early death Daughter   . Birth defects Maternal Uncle   . Heart disease Maternal Grandfather     MI  . Tuberculosis Paternal Grandmother   . Cancer Paternal Grandfather     head and neck cancer, chewed tobacco  . Hypertension Daughter   . Hypertension Brother   . Hyperlipidemia Brother     Social History   Social History  . Marital Status: Single    Spouse Name: N/A  . Number of Children: N/A  . Years of Education: N/A   Occupational History  . Not on file.   Social History Main Topics  . Smoking status: Never Smoker   . Smokeless tobacco: Not on file  . Alcohol Use: No  . Drug Use: No    . Sexual Activity: No     Comment: divorced, widowed, lives with elderly parents, is the caregiver, no dietary restrictions.    Other Topics Concern  . Not on file   Social History Narrative    Outpatient Prescriptions Prior to Visit  Medication Sig Dispense Refill  . ALPRAZolam (XANAX) 0.25 MG tablet Take 1 tablet (0.25 mg total) by mouth daily as needed for anxiety or sleep. 30 tablet 0  . aspirin 81 MG tablet Take 81 mg by mouth 3 (three) times a week.    . B Complex-C (SUPER B COMPLEX) TABS Take 1 tablet by mouth daily.    . carvedilol (COREG) 6.25 MG tablet Take 1 tablet (6.25 mg total) by mouth 2 (two) times daily with a meal. 60 tablet 3  . Coenzyme Q10 (COQ-10) 200 MG CAPS Take 1 capsule by mouth daily.    . Omega-3 Fatty Acids (FISH OIL) 1200 MG CAPS Take 1 capsule by mouth 2 (two) times daily.    [name] OVER THE COUNTER MEDICATION Citrical with Vit D3 630mg /500IU-Take 1 capsule by mouth daily.    . Probiotic Product (PROBIOTIC DAILY PO) Take  by mouth daily.    . Red Yeast Rice 600 MG CAPS Take 1 capsule by mouth daily.    . citalopram (CELEXA) 10 MG tablet Take 1 tablet (10 mg total) by mouth at bedtime. 90 tablet 2   No facility-administered medications prior to visit.    Allergies  Allergen Reactions  . Morphine And Related Swelling  . Statins Other (See Comments)    Myalgias, weakness  . Sulfa Antibiotics Swelling and Rash    Review of Systems  Constitutional: Negative for fever and malaise/fatigue.  HENT: Negative for congestion.   Eyes: Negative for discharge.  Respiratory: Negative for shortness of breath.   Cardiovascular: Negative for chest pain, palpitations and leg swelling.  Gastrointestinal: Negative for nausea and abdominal pain.  Genitourinary: Negative for dysuria.  Musculoskeletal: Negative for falls.  Skin: Negative for rash.  Neurological: Negative for loss of consciousness and headaches.  Endo/Heme/Allergies: Negative for environmental allergies.   Psychiatric/Behavioral: Negative for depression. The patient is not nervous/anxious.        Objective:    Physical Exam  Constitutional: She is oriented to person, place, and time. She appears well-developed and well-nourished. No distress.  HENT:  Head: Normocephalic and atraumatic.  Nose: Nose normal.  Eyes: Right eye exhibits no discharge. [name] eye exhibits no discharge.  Neck: Normal range of motion. Neck supple.  Cardiovascular: Normal rate and regular rhythm.   No murmur heard. Pulmonary/Chest: Effort normal and breath sounds normal.  Abdominal: Soft. Bowel sounds are normal. There is no tenderness.  Musculoskeletal: She exhibits no edema.  Neurological: She is alert and oriented to person, place, and time.  Skin: Skin is warm and dry.  Psychiatric: She has a normal mood and affect.  Nursing note and vitals reviewed.   BP 140/75 mmHg  Pulse 73  Temp(Src) 98.2 F (36.8 C) (Oral)  Ht 5\' 1"  (1.549 m)  Wt 139 lb 6 oz (63.22 kg)  BMI 26.35 kg/m2  SpO2 99% Wt Readings from Last 3 Encounters:  03/19/15 139 lb 6 oz (63.22 kg)  12/13/14 139 lb 8 oz (63.277 kg)  09/10/14 138 lb (62.596 kg)     Lab Results  Component Value Date   WBC 7.5 03/12/2015   HGB 12.3 03/12/2015   HCT 37.0 03/12/2015   PLT 183.0 03/12/2015   GLUCOSE 86 03/12/2015   CHOL 213* 03/12/2015   TRIG 158.0* 03/12/2015   HDL 51.70 03/12/2015   LDLCALC 130* 03/12/2015   ALT 50* 03/12/2015   AST 25 03/12/2015   NA 140 03/12/2015   K 4.2 03/12/2015   CL 104 03/12/2015   CREATININE 0.72 03/12/2015   BUN 10 03/12/2015   CO2 25 03/12/2015   TSH 1.22 03/12/2015    Lab Results  Component Value Date   TSH 1.22 03/12/2015   Lab Results  Component Value Date   WBC 7.5 03/12/2015   HGB 12.3 03/12/2015   HCT 37.0 03/12/2015   MCV 94.1 03/12/2015   PLT 183.0 03/12/2015   Lab Results  Component Value Date   NA 140 03/12/2015   K 4.2 03/12/2015   CO2 25 03/12/2015   GLUCOSE 86 03/12/2015    BUN 10 03/12/2015   CREATININE 0.72 03/12/2015   BILITOT 0.6 03/12/2015   ALKPHOS 61 03/12/2015   AST 25 03/12/2015   ALT 50* 03/12/2015   PROT 6.6 03/12/2015   ALBUMIN 4.2 03/12/2015   CALCIUM 9.3 03/12/2015   GFR 84.44 03/12/2015   Lab Results  Component Value Date  CHOL 213* 03/12/2015   Lab Results  Component Value Date   HDL 51.70 03/12/2015   Lab Results  Component Value Date   LDLCALC 130* 03/12/2015   Lab Results  Component Value Date   TRIG 158.0* 03/12/2015   Lab Results  Component Value Date   CHOLHDL 4 03/12/2015   No results found for: HGBA1C     Assessment & Plan:   Problem List Items Addressed This Visit    Hypertension    Well controlled, no changes to meds. Encouraged heart healthy diet such as the DASH diet and exercise as tolerated.       Hyperlipidemia    Encouraged heart healthy diet, increase exercise, avoid trans fats, consider a krill oil cap daily      GERD (gastroesophageal reflux disease)    Avoid offending foods, start probiotics. Do not eat large meals in late evening and consider raising head of bed.       Encounter for immunization - Primary    Given flu shot today         I have discontinued Ms. Butters's citalopram. I am also having her maintain her aspirin, Fish Oil, OVER THE COUNTER MEDICATION, CoQ-10, Red Yeast Rice, SUPER B COMPLEX, Probiotic Product (PROBIOTIC DAILY PO), carvedilol, and ALPRAZolam.  No orders of the defined types were placed in this encounter.     Penni Homans, MD

## 2015-03-24 NOTE — Assessment & Plan Note (Signed)
Well controlled, no changes to meds. Encouraged heart healthy diet such as the DASH diet and exercise as tolerated.  °

## 2015-03-24 NOTE — Assessment & Plan Note (Signed)
Avoid offending foods, start probiotics. Do not eat large meals in late evening and consider raising head of bed.  

## 2015-04-18 DIAGNOSIS — Z1231 Encounter for screening mammogram for malignant neoplasm of breast: Secondary | ICD-10-CM | POA: Diagnosis not present

## 2015-04-18 DIAGNOSIS — N8111 Cystocele, midline: Secondary | ICD-10-CM | POA: Diagnosis not present

## 2015-04-22 ENCOUNTER — Other Ambulatory Visit: Payer: Self-pay | Admitting: Family Medicine

## 2015-05-27 ENCOUNTER — Ambulatory Visit (HOSPITAL_BASED_OUTPATIENT_CLINIC_OR_DEPARTMENT_OTHER)
Admission: RE | Admit: 2015-05-27 | Discharge: 2015-05-27 | Disposition: A | Discharge status: Home / Self Care | Payer: Commercial Managed Care - HMO | Source: Ambulatory Visit | Attending: Family Medicine | Admitting: Family Medicine

## 2015-05-27 ENCOUNTER — Other Ambulatory Visit: Payer: Self-pay | Admitting: Family Medicine

## 2015-05-27 ENCOUNTER — Encounter: Payer: Self-pay | Admitting: Family Medicine

## 2015-05-27 ENCOUNTER — Ambulatory Visit (INDEPENDENT_AMBULATORY_CARE_PROVIDER_SITE_OTHER): Payer: Commercial Managed Care - HMO | Admitting: Family Medicine

## 2015-05-27 VITALS — BP 152/69 | HR 73 | Temp 97.9°F | Ht 61.0 in | Wt 141.0 lb

## 2015-05-27 DIAGNOSIS — M1612 Unilateral primary osteoarthritis, left hip: Secondary | ICD-10-CM | POA: Diagnosis not present

## 2015-05-27 DIAGNOSIS — M25552 Pain in left hip: Secondary | ICD-10-CM

## 2015-05-27 DIAGNOSIS — K219 Gastro-esophageal reflux disease without esophagitis: Secondary | ICD-10-CM

## 2015-05-27 DIAGNOSIS — Z09 Encounter for follow-up examination after completed treatment for conditions other than malignant neoplasm: Secondary | ICD-10-CM

## 2015-05-27 DIAGNOSIS — Z23 Encounter for immunization: Secondary | ICD-10-CM

## 2015-05-27 DIAGNOSIS — I10 Essential (primary) hypertension: Secondary | ICD-10-CM

## 2015-05-27 MED ORDER — ZOSTER VACCINE LIVE 19400 UNT/0.65ML ~~LOC~~ SOLR: 0.6500 mL | Freq: Once | SUBCUTANEOUS | Status: DC

## 2015-05-27 MED ORDER — TIZANIDINE HCL 2 MG PO TABS: 2.0000 mg | ORAL_TABLET | Freq: Every evening | ORAL | Status: DC | PRN

## 2015-05-27 MED ORDER — PNEUMOCOCCAL 13-VAL CONJ VACC IM SUSP: 0.5000 mL | INTRAMUSCULAR | Status: DC

## 2015-05-27 NOTE — Progress Notes (Signed)
Pre visit review using our clinic review tool, if applicable. No additional management support is needed unless otherwise documented below in the visit note. 

## 2015-05-27 NOTE — Patient Instructions (Signed)

## 2015-05-27 NOTE — Progress Notes (Signed)
Sophia [name] [id number] [birth year] 05/27/2015      Patient Progress Note   Subjective  Chief Complaint  Chief Complaint  Patient presents with  . Hip Pain    HPI  74 year old female presents with left hip pain for 6 days. Pain started when she got up from playing a 3 hour game of scrabble. She had difficulty standing on it and it has kept her from sleeping at night. The pain comes and goes while she walks or standing up from sitting still. Her daughter is a massage therapist and has done some manipulation this past weekend which has helped. The pain is burning in nature and sometimes radiates down to her calf. She has had pain in her left hip for more than 20 years (she worked as a Electrical engineer) but the pain was previously much less severe and never radiated. She began having varicose veins only in the left leg for the past 4 months. She has a slight limp (appears to be trendelenburg but hard to tell due to patient's short stature). Taking tylenol but has not helped. Ice and heat have helped a small amount.  Patient denies shortness of breath, chest pain,changes in urination, GI issues, recent fevers or illnesses    Past Medical History  Diagnosis Date  . Arthritis     hands, knees, etc  . GERD (gastroesophageal reflux disease)   . Hypertension   . Hyperlipidemia   . History of kidney stones   . H/O hematuria     for several years, work only revealed kidney stone on right side  . Diverticulitis   . History of chicken pox   . Anxiety and depression   . SCC (squamous cell carcinoma)     arms  . Bowen's disease   . Osteopenia 06/26/2014  . Bladder prolapse, female, acquired 09/16/2014    Past Surgical History  Procedure Laterality Date  . Cholecystectomy  2005  . Skin biopsy      multiple    Family History  Problem Relation Age of Onset  . Heart disease Mother     aortic stenosis  . Hyperlipidemia Mother   . Hypertension Mother   . Heart attack Mother   .  Cancer Mother     BCC, SCC, skin  . Hypertension Father   . Cancer Father     SCC, BCC, skin  . Early death Daughter   . Birth defects Maternal Uncle   . Heart disease Maternal Grandfather     MI  . Tuberculosis Paternal Grandmother   . Cancer Paternal Grandfather     head and neck cancer, chewed tobacco  . Hypertension Daughter   . Hypertension Brother   . Hyperlipidemia Brother     Social History   Social History  . Marital Status: Single    Spouse Name: N/A  . Number of Children: N/A  . Years of Education: N/A   Occupational History  . Not on file.   Social History Main Topics  . Smoking status: Never Smoker   . Smokeless tobacco: Not on file  . Alcohol Use: No  . Drug Use: No  . Sexual Activity: No     Comment: divorced, widowed, lives with elderly parents, is the caregiver, no dietary restrictions.    Other Topics Concern  . Not on file   Social History Narrative    Current Outpatient Prescriptions on File Prior to Visit  Medication Sig Dispense Refill  . ALPRAZolam (XANAX) 0.25 MG  tablet Take 1 tablet (0.25 mg total) by mouth daily as needed for anxiety or sleep. 30 tablet 0  . aspirin 81 MG tablet Take 81 mg by mouth 3 (three) times a week.    . B Complex-C (SUPER B COMPLEX) TABS Take 1 tablet by mouth daily.    . carvedilol (COREG) 6.25 MG tablet TAKE 1 TABLET(6.25 MG) BY MOUTH TWICE DAILY WITH A MEAL 60 tablet 6  . Coenzyme Q10 (COQ-10) 200 MG CAPS Take 1 capsule by mouth daily.    . Omega-3 Fatty Acids (FISH OIL) 1200 MG CAPS Take 1 capsule by mouth 2 (two) times daily.    [name] OVER THE COUNTER MEDICATION Citrical with Vit D3 630mg /500IU-Take 1 capsule by mouth daily.    . Probiotic Product (PROBIOTIC DAILY PO) Take by mouth daily.    . Red Yeast Rice 600 MG CAPS Take 1 capsule by mouth daily.     No current facility-administered medications on file prior to visit.    Allergies  Allergen Reactions  . Morphine And Related Swelling  . Statins Other  (See Comments)    Myalgias, weakness  . Sulfa Antibiotics Swelling and Rash    Review of Systems   Constitutional: Negative for fever and malaise/fatigue.  HENT: Negative for congestion.  Eyes: Negative for discharge.  Respiratory: Negative for shortness of breath.  Cardiovascular: Negative for chest pain, palpitations and leg swelling.  Gastrointestinal: Negative for nausea, abdominal pain and diarrhea.  Genitourinary: Negative for dysuria and urgency, hematuria and flank pain.  Musculoskeletal: Positive for left hip pain Skin: Positive for varicose vein in left anterior thigh and behind knee  Neurological: Negative for loss of consciousness and headaches.  Endo/Heme/Allergies: Negative for polydipsia.  Psychiatric/Behavioral: Negative for depression and suicidal ideas. The patient is not nervous/anxious and does not have insomnia.   Objective  BP 152/69 mmHg  Pulse 73  Temp(Src) 97.9 F (36.6 C) (Oral)  Ht 5\' 1"  (1.549 m)  Wt 141 lb (63.957 kg)  BMI 26.66 kg/m2  SpO2 100%  Physical Exam   Constitutional: Oriented to person, place, and time. Appears well-nourished. No distress.  Eyes: EOM are normal. Pupils are equal, round, and reactive to light.  Cardiovascular: Normal rate and regular rhythm.  Pulmonary/Chest: Breath sounds normal.  Abdominal: Soft. Bowel sounds are normal.  Lymphadenopathy:   No cervical adenopathy.  MSK: pain with hip flexion on left side. Positive modified trendelenburg test Neurological: Alert and oriented to person, place, and time. Normal reflexes. No cranial nerve deficit. Equal strength in peroneal, femoral, tibial nerves bilaterally    Assessment & Plan  Hip Pain -likely bursitis/tendonitis in nature due to symptoms and improvement with massage therapy     1.unlikely septic joint since afebrile and no swelling     2.unlikely hip fracture due to ability to walk     3.unlikely neurologic as strength and reflexes were normal  bilaterally -refer to sports medicine for further evaluation and possibly PT after -DEXA scan as screening for osteoporosis -X ray to rule out fracture  Patient seen and examined with student. Documentation reviewed and agree with note.  Mosie Lukes, MD

## 2015-05-29 ENCOUNTER — Encounter: Payer: Self-pay | Admitting: Family Medicine

## 2015-05-29 ENCOUNTER — Ambulatory Visit (INDEPENDENT_AMBULATORY_CARE_PROVIDER_SITE_OTHER): Payer: Commercial Managed Care - HMO | Admitting: Family Medicine

## 2015-05-29 VITALS — BP 145/88 | HR 73 | Ht 61.0 in | Wt 140.0 lb

## 2015-05-29 DIAGNOSIS — M25552 Pain in left hip: Secondary | ICD-10-CM | POA: Diagnosis not present

## 2015-05-29 DIAGNOSIS — M25559 Pain in unspecified hip: Secondary | ICD-10-CM | POA: Insufficient documentation

## 2015-05-29 NOTE — Assessment & Plan Note (Addendum)
Encouraged moist heat. Stay active. Referred to sports med, given Tizanidine to use rhs prn.

## 2015-05-29 NOTE — Assessment & Plan Note (Signed)
Immunizations given today.

## 2015-05-29 NOTE — Progress Notes (Signed)
Subjective:    Patient ID: Sophia [name], female    DOB: March 05, 1941, 74 y.o.   MRN: [id number]  Chief Complaint  Patient presents with  . Hip Pain    HPI Patient is in today for evaluation of [name] hip pain. It has been worsening over several days. She denies any falls or trauma. She has been having trouble with sleeping due to the pain. No incontinence. Denies CP/palp/SOB/HA/congestion/fevers/GI or GU c/o. Taking meds as prescribed  Past Medical History  Diagnosis Date  . Arthritis     hands, knees, etc  . GERD (gastroesophageal reflux disease)   . Hypertension   . Hyperlipidemia   . History of kidney stones   . H/O hematuria     for several years, work only revealed kidney stone on right side  . Diverticulitis   . History of chicken pox   . Anxiety and depression   . SCC (squamous cell carcinoma)     arms  . Bowen's disease   . Osteopenia 06/26/2014  . Bladder prolapse, female, acquired 09/16/2014    Past Surgical History  Procedure Laterality Date  . Cholecystectomy  2005  . Skin biopsy      multiple    Family History  Problem Relation Age of Onset  . Heart disease Mother     aortic stenosis  . Hyperlipidemia Mother   . Hypertension Mother   . Heart attack Mother   . Cancer Mother     BCC, SCC, skin  . Hypertension Father   . Cancer Father     SCC, BCC, skin  . Early death Daughter   . Birth defects Maternal Uncle   . Heart disease Maternal Grandfather     MI  . Tuberculosis Paternal Grandmother   . Cancer Paternal Grandfather     head and neck cancer, chewed tobacco  . Hypertension Daughter   . Hypertension Brother   . Hyperlipidemia Brother     Social History   Social History  . Marital Status: Single    Spouse Name: N/A  . Number of Children: N/A  . Years of Education: N/A   Occupational History  . Not on file.   Social History Main Topics  . Smoking status: Never Smoker   . Smokeless tobacco: Not on file  . Alcohol Use: No  .  Drug Use: No  . Sexual Activity: No     Comment: divorced, widowed, lives with elderly parents, is the caregiver, no dietary restrictions.    Other Topics Concern  . Not on file   Social History Narrative    Outpatient Prescriptions Prior to Visit  Medication Sig Dispense Refill  . ALPRAZolam (XANAX) 0.25 MG tablet Take 1 tablet (0.25 mg total) by mouth daily as needed for anxiety or sleep. 30 tablet 0  . aspirin 81 MG tablet Take 81 mg by mouth 3 (three) times a week.    . B Complex-C (SUPER B COMPLEX) TABS Take 1 tablet by mouth daily.    . carvedilol (COREG) 6.25 MG tablet TAKE 1 TABLET(6.25 MG) BY MOUTH TWICE DAILY WITH A MEAL 60 tablet 6  . Coenzyme Q10 (COQ-10) 200 MG CAPS Take 1 capsule by mouth daily.    . Omega-3 Fatty Acids (FISH OIL) 1200 MG CAPS Take 1 capsule by mouth 2 (two) times daily.    [name] OVER THE COUNTER MEDICATION Citrical with Vit D3 630mg /500IU-Take 1 capsule by mouth daily.    . Probiotic Product (PROBIOTIC DAILY PO) Take  by mouth daily.    . Red Yeast Rice 600 MG CAPS Take 1 capsule by mouth daily.     No facility-administered medications prior to visit.    Allergies  Allergen Reactions  . Morphine And Related Swelling  . Statins Other (See Comments)    Myalgias, weakness  . Sulfa Antibiotics Swelling and Rash    Review of Systems  Constitutional: Negative for fever and malaise/fatigue.  HENT: Negative for congestion.   Eyes: Negative for discharge.  Respiratory: Negative for shortness of breath.   Cardiovascular: Negative for chest pain, palpitations and leg swelling.  Gastrointestinal: Negative for nausea and abdominal pain.  Genitourinary: Negative for dysuria.  Musculoskeletal: Positive for joint pain. Negative for falls.  Skin: Negative for rash.  Neurological: Negative for loss of consciousness and headaches.  Endo/Heme/Allergies: Negative for environmental allergies.  Psychiatric/Behavioral: Negative for depression. The patient is not  nervous/anxious.        Objective:    Physical Exam  Constitutional: She is oriented to person, place, and time. She appears well-developed and well-nourished. No distress.  HENT:  Head: Normocephalic and atraumatic.  Nose: Nose normal.  Eyes: Right eye exhibits no discharge. [name] eye exhibits no discharge.  Neck: Normal range of motion. Neck supple.  Cardiovascular: Normal rate and regular rhythm.   No murmur heard. Pulmonary/Chest: Effort normal and breath sounds normal.  Abdominal: Soft. Bowel sounds are normal. There is no tenderness.  Musculoskeletal: She exhibits no edema.  Neurological: She is alert and oriented to person, place, and time.  Skin: Skin is warm and dry.  Psychiatric: She has a normal mood and affect.  Nursing note and vitals reviewed.   BP 152/69 mmHg  Pulse 73  Temp(Src) 97.9 F (36.6 C) (Oral)  Ht 5\' 1"  (1.549 m)  Wt 141 lb (63.957 kg)  BMI 26.66 kg/m2  SpO2 100% Wt Readings from Last 3 Encounters:  05/29/15 140 lb (63.504 kg)  05/27/15 141 lb (63.957 kg)  03/19/15 139 lb 6 oz (63.22 kg)     Lab Results  Component Value Date   WBC 7.5 03/12/2015   HGB 12.3 03/12/2015   HCT 37.0 03/12/2015   PLT 183.0 03/12/2015   GLUCOSE 86 03/12/2015   CHOL 213* 03/12/2015   TRIG 158.0* 03/12/2015   HDL 51.70 03/12/2015   LDLCALC 130* 03/12/2015   ALT 50* 03/12/2015   AST 25 03/12/2015   NA 140 03/12/2015   K 4.2 03/12/2015   CL 104 03/12/2015   CREATININE 0.72 03/12/2015   BUN 10 03/12/2015   CO2 25 03/12/2015   TSH 1.22 03/12/2015    Lab Results  Component Value Date   TSH 1.22 03/12/2015   Lab Results  Component Value Date   WBC 7.5 03/12/2015   HGB 12.3 03/12/2015   HCT 37.0 03/12/2015   MCV 94.1 03/12/2015   PLT 183.0 03/12/2015   Lab Results  Component Value Date   NA 140 03/12/2015   K 4.2 03/12/2015   CO2 25 03/12/2015   GLUCOSE 86 03/12/2015   BUN 10 03/12/2015   CREATININE 0.72 03/12/2015   BILITOT 0.6 03/12/2015    ALKPHOS 61 03/12/2015   AST 25 03/12/2015   ALT 50* 03/12/2015   PROT 6.6 03/12/2015   ALBUMIN 4.2 03/12/2015   CALCIUM 9.3 03/12/2015   GFR 84.44 03/12/2015   Lab Results  Component Value Date   CHOL 213* 03/12/2015   Lab Results  Component Value Date   HDL 51.70 03/12/2015  Lab Results  Component Value Date   LDLCALC 130* 03/12/2015   Lab Results  Component Value Date   TRIG 158.0* 03/12/2015   Lab Results  Component Value Date   CHOLHDL 4 03/12/2015   No results found for: HGBA1C     Assessment & Plan:   Problem List Items Addressed This Visit    GERD (gastroesophageal reflux disease)    Avoid offending foods, start probiotics. Do not eat large meals in late evening and consider raising head of bed.       Hip pain - Primary    Encouraged moist heat. Stay active. Referred to sports med, given Tizanidine to use rhs prn.       Relevant Orders   Ambulatory referral to Sports Medicine   DG HIP UNILAT WITH PELVIS 2-3 VIEWS [name] (Completed)   Hypertension    Improved some on recheck. Minimize sodium and caffeine       Other Visit Diagnoses    Need for prophylactic vaccination and inoculation against viral disease        Relevant Medications    pneumococcal 13-valent conjugate vaccine (PREVNAR 13) SUSP injection    Need for immunization follow-up        Relevant Medications    zoster vaccine live, PF, (ZOSTAVAX) 96295 UNT/0.65ML injection       I am having Ms. Sopko start on pneumococcal 13-valent conjugate vaccine and zoster vaccine live (PF). I am also having her maintain her aspirin, Fish Oil, OVER THE COUNTER MEDICATION, CoQ-10, Red Yeast Rice, SUPER B COMPLEX, Probiotic Product (PROBIOTIC DAILY PO), ALPRAZolam, and carvedilol.  Meds ordered this encounter  Medications  . DISCONTD: tiZANidine (ZANAFLEX) 2 MG tablet    Sig: Take 1 tablet (2 mg total) by mouth at bedtime as needed for muscle spasms.    Dispense:  15 tablet    Refill:  0  .  pneumococcal 13-valent conjugate vaccine (PREVNAR 13) SUSP injection    Sig: Inject 0.5 mLs into the muscle tomorrow at 10 am.    Dispense:  0.5 mL    Refill:  0  . zoster vaccine live, PF, (ZOSTAVAX) 28413 UNT/0.65ML injection    Sig: Inject 19,400 Units into the skin once.    Dispense:  1 each    Refill:  0     Penni Homans, MD

## 2015-05-29 NOTE — Assessment & Plan Note (Signed)
Avoid offending foods, start probiotics. Do not eat large meals in late evening and consider raising head of bed.  

## 2015-05-29 NOTE — Patient Instructions (Signed)
You have IT band syndrome and trochanteric bursitis. Avoid painful activities as much as possible. Ice over area of pain 3-4 times a day for 15 minutes at a time Hip side raise exercise and standing hip rotations 3 sets of 10-15 once a day - add weights if this becomes too easy. Stretches - pick 2 and hold for 20-30 seconds x 3 - do once or twice a day. Tylenol and/or aleve as needed for pain. If not improving, can consider physical therapy and/or steroid injection. Follow up with me as needed.

## 2015-05-29 NOTE — Assessment & Plan Note (Signed)
Improved some on recheck. Minimize sodium and caffeine

## 2015-05-30 NOTE — Progress Notes (Signed)
PCP and consultation requested by: Penni Homans, MD  Subjective:   HPI: Patient is a 74 y.o. female here for left hip pain.  Patient reports having about 1 week of lateral left hip pain. Started when she sat for about 3 hours and went to get up. Pain became very severe at 8/10 level, sharp but has improved and now only 2/10 level. Pain radiates down the leg. Worse lying on either side. Had massage on Saturday and Sunday to the area which helped. No skin changes, fever, other complaints.  Past Medical History  Diagnosis Date  . Arthritis     hands, knees, etc  . GERD (gastroesophageal reflux disease)   . Hypertension   . Hyperlipidemia   . History of kidney stones   . H/O hematuria     for several years, work only revealed kidney stone on right side  . Diverticulitis   . History of chicken pox   . Anxiety and depression   . SCC (squamous cell carcinoma)     arms  . Bowen's disease   . Osteopenia 06/26/2014  . Bladder prolapse, female, acquired 09/16/2014    Current Outpatient Prescriptions on File Prior to Visit  Medication Sig Dispense Refill  . ALPRAZolam (XANAX) 0.25 MG tablet Take 1 tablet (0.25 mg total) by mouth daily as needed for anxiety or sleep. 30 tablet 0  . aspirin 81 MG tablet Take 81 mg by mouth 3 (three) times a week.    . B Complex-C (SUPER B COMPLEX) TABS Take 1 tablet by mouth daily.    . carvedilol (COREG) 6.25 MG tablet TAKE 1 TABLET(6.25 MG) BY MOUTH TWICE DAILY WITH A MEAL 60 tablet 6  . Coenzyme Q10 (COQ-10) 200 MG CAPS Take 1 capsule by mouth daily.    . Omega-3 Fatty Acids (FISH OIL) 1200 MG CAPS Take 1 capsule by mouth 2 (two) times daily.    [name] OVER THE COUNTER MEDICATION Citrical with Vit D3 630mg /500IU-Take 1 capsule by mouth daily.    . pneumococcal 13-valent conjugate vaccine (PREVNAR 13) SUSP injection Inject 0.5 mLs into the muscle tomorrow at 10 am. 0.5 mL 0  . Probiotic Product (PROBIOTIC DAILY PO) Take by mouth daily.    . Red Yeast  Rice 600 MG CAPS Take 1 capsule by mouth daily.    [name] tiZANidine (ZANAFLEX) 2 MG tablet TAKE 1 TABLET(2 MG) BY MOUTH AT BEDTIME AS NEEDED FOR MUSCLE SPASMS 90 tablet 0  . zoster vaccine live, PF, (ZOSTAVAX) 16109 UNT/0.65ML injection Inject 19,400 Units into the skin once. 1 each 0   No current facility-administered medications on file prior to visit.    Past Surgical History  Procedure Laterality Date  . Cholecystectomy  2005  . Skin biopsy      multiple    Allergies  Allergen Reactions  . Morphine And Related Swelling  . Statins Other (See Comments)    Myalgias, weakness  . Sulfa Antibiotics Swelling and Rash    Social History   Social History  . Marital Status: Single    Spouse Name: N/A  . Number of Children: N/A  . Years of Education: N/A   Occupational History  . Not on file.   Social History Main Topics  . Smoking status: Never Smoker   . Smokeless tobacco: Not on file  . Alcohol Use: No  . Drug Use: No  . Sexual Activity: No     Comment: divorced, widowed, lives with elderly parents, is the caregiver, no dietary  restrictions.    Other Topics Concern  . Not on file   Social History Narrative    Family History  Problem Relation Age of Onset  . Heart disease Mother     aortic stenosis  . Hyperlipidemia Mother   . Hypertension Mother   . Heart attack Mother   . Cancer Mother     BCC, SCC, skin  . Hypertension Father   . Cancer Father     SCC, BCC, skin  . Early death Daughter   . Birth defects Maternal Uncle   . Heart disease Maternal Grandfather     MI  . Tuberculosis Paternal Grandmother   . Cancer Paternal Grandfather     head and neck cancer, chewed tobacco  . Hypertension Daughter   . Hypertension Brother   . Hyperlipidemia Brother     BP 145/88 mmHg  Pulse 73  Ht 5\' 1"  (1.549 m)  Wt 140 lb (63.504 kg)  BMI 26.47 kg/m2  Review of Systems: See HPI above.    Objective:  Physical Exam:  Gen: NAD  Back: No gross deformity,  scoliosis. No TTP.  No midline or bony TTP. FROM without pain. 2+ MSRs in patellar and achilles tendons, equal bilaterally. Negative SLRs. Sensation intact to light touch bilaterally.  Left hip: Negative logroll bilateral hips Negative fabers and piriformis stretches. Strength 4/5 with hip abduction, 5/5 other motions.    Assessment & Plan:  1. Left hip pain - 2/2 IT band syndrome and trochanteric bursitis.  Discussed options - she would like to start with home exercises and stretches which we reviewed.  Icing, tylenol/nsaids if needed.  Consider PT, injection if not improving.  F/u in 6 weeks or prn.

## 2015-05-30 NOTE — Assessment & Plan Note (Signed)
2/2 IT band syndrome and trochanteric bursitis.  Discussed options - she would like to start with home exercises and stretches which we reviewed.  Icing, tylenol/nsaids if needed.  Consider PT, injection if not improving.  F/u in 6 weeks or prn.

## 2015-06-12 ENCOUNTER — Telehealth: Payer: Self-pay | Admitting: Behavioral Health

## 2015-06-12 NOTE — Telephone Encounter (Signed)
Confirmed appointment for Medicare Wellness Visit, 06/13/15 at 11:00 AM.

## 2015-06-13 ENCOUNTER — Ambulatory Visit (INDEPENDENT_AMBULATORY_CARE_PROVIDER_SITE_OTHER): Payer: Commercial Managed Care - HMO

## 2015-06-13 ENCOUNTER — Telehealth: Payer: Self-pay

## 2015-06-13 VITALS — BP 138/78 | HR 66 | Ht 60.5 in | Wt 139.2 lb

## 2015-06-13 DIAGNOSIS — Z0000 Encounter for general adult medical examination without abnormal findings: Secondary | ICD-10-CM

## 2015-06-13 NOTE — Progress Notes (Signed)
Pre visit review using our clinic review tool, if applicable. No additional management support is needed unless otherwise documented below in the visit note. 

## 2015-06-13 NOTE — Progress Notes (Addendum)
Subjective:   Sophia [name] is a 74 y.o. female who presents for an Initial Medicare Annual Wellness Visit.  Review of Systems    Cardiac Risk Factors include: dyslipidemia, HTN, Advanced Age Sleep patterns:  Sleeps 7 hours per night/gets at least 1-2 times per night to void.   Home Safety/Smoke Alarms:  Feels safe at home.  Lives with mother.  Mother currently on Hospice.  Smoke alarms present.   Firearm Safety: No firearms.   Seat Belt Safety/Bike Helmet:  Always wears seat belt.    Counseling:   Eye Exam- Nov, 2015  Dental- Haven't been in a couple of years due to caring for parents, plans to schedule an appt Female:  Mammo-04/18/15-normal-  Dexa scan-09/09/07     CCS-2010-normal; repeat 10 years--Dr. Denman George Objective:    Today's Vitals   06/13/15 1109  BP: 138/78  Pulse: 66  Height: 5' 0.5" (1.537 m)  Weight: 139 lb 3.2 oz (63.141 kg)  SpO2: 99%  PainSc: 0-No pain    Current Medications (verified) Outpatient Encounter Prescriptions as of 06/13/2015  Medication Sig  . ALPRAZolam (XANAX) 0.25 MG tablet Take 1 tablet (0.25 mg total) by mouth daily as needed for anxiety or sleep.  [name] aspirin 81 MG tablet Take 81 mg by mouth 3 (three) times a week.  . B Complex-C (SUPER B COMPLEX) TABS Take 1 tablet by mouth daily.  . carvedilol (COREG) 6.25 MG tablet TAKE 1 TABLET(6.25 MG) BY MOUTH TWICE DAILY WITH A MEAL  . Coenzyme Q10 (COQ-10) 200 MG CAPS Take 1 capsule by mouth daily.  . Omega-3 Fatty Acids (FISH OIL) 1200 MG CAPS Take 1 capsule by mouth 2 (two) times daily.  [name] OVER THE COUNTER MEDICATION Citrical with Vit D3 630mg /500IU-Take 1 capsule by mouth daily.  . Probiotic Product (PROBIOTIC DAILY PO) Take by mouth daily.  . Red Yeast Rice 600 MG CAPS Take 1 capsule by mouth daily.  . pneumococcal 13-valent conjugate vaccine (PREVNAR 13) SUSP injection Inject 0.5 mLs into the muscle tomorrow at 10 am. (Patient not taking: Reported on 06/13/2015)  . tiZANidine (ZANAFLEX) 2 MG  tablet TAKE 1 TABLET(2 MG) BY MOUTH AT BEDTIME AS NEEDED FOR MUSCLE SPASMS (Patient not taking: Reported on 06/13/2015)  . zoster vaccine live, PF, (ZOSTAVAX) 13086 UNT/0.65ML injection Inject 19,400 Units into the skin once. (Patient not taking: Reported on 06/13/2015)   No facility-administered encounter medications on file as of 06/13/2015.    Allergies (verified) Morphine and related; Statins; and Sulfa antibiotics   History: Past Medical History  Diagnosis Date  . Arthritis     hands, knees, etc  . GERD (gastroesophageal reflux disease)   . Hypertension   . Hyperlipidemia   . History of kidney stones   . H/O hematuria     for several years, work only revealed kidney stone on right side  . Diverticulitis   . History of chicken pox   . Anxiety and depression   . SCC (squamous cell carcinoma)     arms  . Bowen's disease   . Osteopenia 06/26/2014  . Bladder prolapse, female, acquired 09/16/2014   Past Surgical History  Procedure Laterality Date  . Cholecystectomy  2005  . Skin biopsy      multiple   Family History  Problem Relation Age of Onset  . Heart disease Mother     aortic stenosis  . Hyperlipidemia Mother   . Hypertension Mother   . Heart attack Mother   . Cancer Mother  BCC, SCC, skin  . Hypertension Father   . Cancer Father     SCC, BCC, skin  . Early death Daughter   . Birth defects Maternal Uncle   . Heart disease Maternal Grandfather     MI  . Tuberculosis Paternal Grandmother   . Cancer Paternal Grandfather     head and neck cancer, chewed tobacco  . Hypertension Daughter   . Hypertension Brother   . Hyperlipidemia Brother    Social History   Occupational History  . Not on file.   Social History Main Topics  . Smoking status: Never Smoker   . Smokeless tobacco: Not on file  . Alcohol Use: No  . Drug Use: No  . Sexual Activity: No     Comment: divorced, widowed, lives with elderly parents, is the caregiver, no dietary restrictions.       Tobacco Counseling Counseling given: No   Activities of Daily Living In your present state of health, do you have any difficulty performing the following activities: 06/13/2015 06/26/2014  Hearing? N N  Vision? N N  Difficulty concentrating or making decisions? N N  Walking or climbing stairs? Y N  Dressing or bathing? N N  Doing errands, shopping? N N  Preparing Food and eating ? N -  Using the Toilet? N -  In the past six months, have you accidently leaked urine? N -  Do you have problems with loss of bowel control? N -  Managing your Medications? N -  Managing your Finances? N -  Housekeeping or managing your Housekeeping? N -    Immunizations and Health Maintenance Immunization History  Administered Date(s) Administered  . Influenza,inj,Quad PF,36+ Mos 03/19/2015  . Influenza-Unspecified 04/29/2014   Health Maintenance Due  Topic Date Due  . TETANUS/TDAP  05/25/1961  . COLONOSCOPY  05/25/1992  . ZOSTAVAX  05/25/2002  . PNA vac Low Risk Adult (1 of 2 - PCV13) 05/26/2007    Patient Care Team: Mosie Lukes, MD as PCP - General (Family Medicine) Azucena Fallen, MD as Consulting Physician (Obstetrics and Gynecology) Rolm Bookbinder, MD as Consulting Physician (Dermatology) Katy Apo, MD as Consulting Physician (Ophthalmology)  Indicate any recent Medical Services you may have received from other than [facility] providers in the past year (date may be approximate).     Assessment:   This is a routine wellness examination for Sophia [name].   Hearing/Vision screen  Hearing Screening   125Hz  250Hz  500Hz  1000Hz  2000Hz  4000Hz  8000Hz   Right ear:   Fail Pass Pass Fail   Left ear:   Fail Pass Pass Fail   Comments: Pt states may have slight hearing loss due to age.   Universal Hearing Screener completed.    Vision Screening Comments: No changes in vision. Last eye exam: 04/2014; Due; plans to schedule appt.  Dietary issues and exercise activities discussed: Current Exercise  Habits:: The patient does not participate in regular exercise at present (Very active though )   Diet: Eats relatively, but recently has increased carb intake due to stress.    Goals    . Schedule eye and dental exams.        Depression Screen PHQ 2/9 Scores 06/13/2015 06/26/2014  PHQ - 2 Score 1 0    Fall Risk Fall Risk  06/13/2015 06/26/2014  Falls in the past year? No No    Cognitive Function: MMSE - Mini Mental State Exam 06/13/2015  Orientation to time 5  Orientation to Place 5  Registration 3  Attention/  Calculation 5  Recall 3  Language- name 2 objects 2  Language- repeat 1  Language- follow 3 step command 3  Language- read & follow direction 1  Write a sentence 1  Copy design 1  Total score 30    Screening Tests Health Maintenance  Topic Date Due  . TETANUS/TDAP  05/25/1961  . COLONOSCOPY  05/25/1992  . ZOSTAVAX  05/25/2002  . PNA vac Low Risk Adult (1 of 2 - PCV13) 05/26/2007  . INFLUENZA VACCINE  01/28/2016  . DEXA SCAN  Completed      Plan:  Schedule dental and eye appointment.  Receive pneumonia and shingles vaccine.  Follow up with Dr. Charlett Blake in March, 2017 as scheduled.    Continue to eat heart healthy diet (full of fruits, vegetables, whole grains, lean protein, water--limit salt, fat, and sugar intake) and increase physical activity as tolerated.  Continue doing brain stimulating activities (puzzles, reading, adult coloring books, staying active) to keep memory sharp.   Try to get out more to do things just for you to manage stress.      During the course of the visit, Sophia [name] was educated and counseled about the following appropriate screening and preventive services:   Vaccines to include Pneumoccal, Influenza, Hepatitis B, Td, Zostavax, HCV  Electrocardiogram  Cardiovascular disease screening  Colorectal cancer screening  Bone density screening  Diabetes screening  Glaucoma screening  Mammography/PAP  Nutrition  counseling  Smoking cessation counseling  Patient Instructions (the written plan) were given to the patient.    Rudene Anda, RN   06/13/2015

## 2015-06-13 NOTE — Telephone Encounter (Signed)
During AWV, requested on refill on Xanax.  Last filled:  01/29/15 Amt: 30, 0 Last OV: 05/27/15 No contract or UDS on file.  Please advise.

## 2015-06-13 NOTE — Patient Instructions (Addendum)
Schedule dental and eye appointment.  Receive pneumonia and shingles vaccine.  Follow up with Dr. Charlett Blake in March, 2017 as scheduled.    Continue to eat heart healthy diet (full of fruits, vegetables, whole grains, lean protein, water--limit salt, fat, and sugar intake) and increase physical activity as tolerated.  Continue doing brain stimulating activities (puzzles, reading, adult coloring books, staying active) to keep memory sharp.   Try to get out more to do things just for you to manage stress.      Fat and Cholesterol Restricted Diet High levels of fat and cholesterol in your blood may lead to various health problems, such as diseases of the heart, blood vessels, gallbladder, liver, and pancreas. Fats are concentrated sources of energy that come in various forms. Certain types of fat, including saturated fat, may be harmful in excess. Cholesterol is a substance needed by your body in small amounts. Your body makes all the cholesterol it needs. Excess cholesterol comes from the food you eat. When you have high levels of cholesterol and saturated fat in your blood, health problems can develop because the excess fat and cholesterol will gather along the walls of your blood vessels, causing them to narrow. Choosing the right foods will help you control your intake of fat and cholesterol. This will help keep the levels of these substances in your blood within normal limits and reduce your risk of disease. WHAT IS MY PLAN? Your health care provider recommends that you:  Get no more than __________ % of the total calories in your daily diet from fat.  Limit your intake of saturated fat to less than ______% of your total calories each day.  Limit the amount of cholesterol in your diet to less than _________mg per day. WHAT TYPES OF FAT SHOULD I CHOOSE?  Choose healthy fats more often. Choose monounsaturated and polyunsaturated fats, such as olive and canola oil, flaxseeds, walnuts, almonds,  and seeds.  Eat more omega-3 fats. Good choices include salmon, mackerel, sardines, tuna, flaxseed oil, and ground flaxseeds. Aim to eat fish at least two times a week.  Limit saturated fats. Saturated fats are primarily found in animal products, such as meats, butter, and cream. Plant sources of saturated fats include palm oil, palm kernel oil, and coconut oil.  Avoid foods with partially hydrogenated oils in them. These contain trans fats. Examples of foods that contain trans fats are stick margarine, some tub margarines, cookies, crackers, and other baked goods. WHAT GENERAL GUIDELINES DO I NEED TO FOLLOW? These guidelines for healthy eating will help you control your intake of fat and cholesterol:  Check food labels carefully to identify foods with trans fats or high amounts of saturated fat.  Fill one half of your plate with vegetables and green salads.  Fill one fourth of your plate with whole grains. Look for the word "whole" as the first word in the ingredient list.  Fill one fourth of your plate with lean protein foods.  Limit fruit to two servings a day. Choose fruit instead of juice.  Eat more foods that contain soluble fiber. Examples of foods that contain this type of fiber are apples, broccoli, carrots, beans, peas, and barley. Aim to get 20-30 g of fiber per day.  Eat more home-cooked food and less restaurant, buffet, and fast food.  Limit or avoid alcohol.  Limit foods high in starch and sugar.  Limit fried foods.  Cook foods using methods other than frying. Baking, boiling, grilling, and broiling are all  great options.  Lose weight if you are overweight. Losing just 5-10% of your initial body weight can help your overall health and prevent diseases such as diabetes and heart disease. WHAT FOODS CAN I EAT? Grains Whole grains, such as whole wheat or whole grain breads, crackers, cereals, and pasta. Unsweetened oatmeal, bulgur, barley, quinoa, or brown rice. Corn or  whole wheat flour tortillas. Vegetables Fresh or frozen vegetables (raw, steamed, roasted, or grilled). Green salads. Fruits All fresh, canned (in natural juice), or frozen fruits. Meat and Other Protein Products Ground beef (85% or leaner), grass-fed beef, or beef trimmed of fat. Skinless chicken or [place]. Ground chicken or [place]. Pork trimmed of fat. All fish and seafood. Eggs. Dried beans, peas, or lentils. Unsalted nuts or seeds. Unsalted canned or dry beans. Dairy Low-fat dairy products, such as skim or 1% milk, 2% or reduced-fat cheeses, low-fat ricotta or cottage cheese, or plain low-fat yogurt. Fats and Oils Tub margarines without trans fats. Light or reduced-fat mayonnaise and salad dressings. Avocado. Olive, canola, sesame, or safflower oils. Natural peanut or almond butter (choose ones without added sugar and oil). The items listed above may not be a complete list of recommended foods or beverages. Contact your dietitian for more options. WHAT FOODS ARE NOT RECOMMENDED? Grains White bread. White pasta. White rice. Cornbread. Bagels, pastries, and croissants. Crackers that contain trans fat. Vegetables White potatoes. Corn. Creamed or fried vegetables. Vegetables in a cheese sauce. Fruits Dried fruits. Canned fruit in light or heavy syrup. Fruit juice. Meat and Other Protein Products Fatty cuts of meat. Ribs, chicken wings, bacon, sausage, bologna, salami, chitterlings, fatback, hot dogs, bratwurst, and packaged luncheon meats. Liver and organ meats. Dairy Whole or 2% milk, cream, half-and-half, and cream cheese. Whole milk cheeses. Whole-fat or sweetened yogurt. Full-fat cheeses. Nondairy creamers and whipped toppings. Processed cheese, cheese spreads, or cheese curds. Sweets and Desserts Corn syrup, sugars, honey, and molasses. Candy. Jam and jelly. Syrup. Sweetened cereals. Cookies, pies, cakes, donuts, muffins, and ice cream. Fats and Oils Butter, stick margarine, lard,  shortening, ghee, or bacon fat. Coconut, palm kernel, or palm oils. Beverages Alcohol. Sweetened drinks (such as sodas, lemonade, and fruit drinks or punches). The items listed above may not be a complete list of foods and beverages to avoid. Contact your dietitian for more information.   This information is not intended to replace advice given to you by your health care provider. Make sure you discuss any questions you have with your health care provider.   Document Released: 06/15/2005 Document Revised: 07/06/2014 Document Reviewed: 09/13/2013 Elsevier Interactive Patient Education 2016 [employer]  Fat and Cholesterol Restricted Diet Getting too much fat and cholesterol in your diet may cause health problems. Following this diet helps keep your fat and cholesterol at normal levels. This can keep you from getting sick. WHAT TYPES OF FAT SHOULD I CHOOSE?  Choose monosaturated and polyunsaturated fats. These are found in foods such as olive oil, canola oil, flaxseeds, walnuts, almonds, and seeds.  Eat more omega-3 fats. Good choices include salmon, mackerel, sardines, tuna, flaxseed oil, and ground flaxseeds.  Limit saturated fats. These are in animal products such as meats, butter, and cream. They can also be in plant products such as palm oil, palm kernel oil, and coconut oil.   Avoid foods with partially hydrogenated oils in them. These contain trans fats. Examples of foods that have trans fats are stick margarine, some tub margarines, cookies, crackers, and other baked goods. WHAT GENERAL GUIDELINES  DO I NEED TO FOLLOW?   Check food labels. Look for the words "trans fat" and "saturated fat."  When preparing a meal:  Fill half of your plate with vegetables and green salads.  Fill one fourth of your plate with whole grains. Look for the word "whole" as the first word in the ingredient list.  Fill one fourth of your plate with lean protein foods.  Limit fruit to two servings a  day. Choose fruit instead of juice.  Eat more foods with soluble fiber. Examples of foods with this type of fiber are apples, broccoli, carrots, beans, peas, and barley. Try to get 20-30 g (grams) of fiber per day.  Eat more home-cooked foods. Eat less at restaurants and buffets.  Limit or avoid alcohol.  Limit foods high in starch and sugar.  Limit fried foods.  Cook foods without frying them. Baking, boiling, grilling, and broiling are all great options.  Lose weight if you are overweight. Losing even a small amount of weight can help your overall health. It can also help prevent diseases such as diabetes and heart disease. WHAT FOODS CAN I EAT? Grains Whole grains, such as whole wheat or whole grain breads, crackers, cereals, and pasta. Unsweetened oatmeal, bulgur, barley, quinoa, or brown rice. Corn or whole wheat flour tortillas. Vegetables Fresh or frozen vegetables (raw, steamed, roasted, or grilled). Green salads. Fruits All fresh, canned (in natural juice), or frozen fruits. Meat and Other Protein Products Ground beef (85% or leaner), grass-fed beef, or beef trimmed of fat. Skinless chicken or [place]. Ground chicken or [place]. Pork trimmed of fat. All fish and seafood. Eggs. Dried beans, peas, or lentils. Unsalted nuts or seeds. Unsalted canned or dry beans. Dairy Low-fat dairy products, such as skim or 1% milk, 2% or reduced-fat cheeses, low-fat ricotta or cottage cheese, or plain low-fat yogurt. Fats and Oils Tub margarines without trans fats. Light or reduced-fat mayonnaise and salad dressings. Avocado. Olive, canola, sesame, or safflower oils. Natural peanut or almond butter (choose ones without added sugar and oil). The items listed above may not be a complete list of recommended foods or beverages. Contact your dietitian for more options. WHAT FOODS ARE NOT RECOMMENDED? Grains White bread. White pasta. White rice. Cornbread. Bagels, pastries, and croissants. Crackers  that contain trans fat. Vegetables White potatoes. Corn. Creamed or fried vegetables. Vegetables in a cheese sauce. Fruits Dried fruits. Canned fruit in light or heavy syrup. Fruit juice. Meat and Other Protein Products Fatty cuts of meat. Ribs, chicken wings, bacon, sausage, bologna, salami, chitterlings, fatback, hot dogs, bratwurst, and packaged luncheon meats. Liver and organ meats. Dairy Whole or 2% milk, cream, half-and-half, and cream cheese. Whole milk cheeses. Whole-fat or sweetened yogurt. Full-fat cheeses. Nondairy creamers and whipped toppings. Processed cheese, cheese spreads, or cheese curds. Sweets and Desserts Corn syrup, sugars, honey, and molasses. Candy. Jam and jelly. Syrup. Sweetened cereals. Cookies, pies, cakes, donuts, muffins, and ice cream. Fats and Oils Butter, stick margarine, lard, shortening, ghee, or bacon fat. Coconut, palm kernel, or palm oils. Beverages Alcohol. Sweetened drinks (such as sodas, lemonade, and fruit drinks or punches). The items listed above may not be a complete list of foods and beverages to avoid. Contact your dietitian for more information.   This information is not intended to replace advice given to you by your health care provider. Make sure you discuss any questions you have with your health care provider.   Document Released: 12/15/2011 Document Revised: 07/06/2014 Document Reviewed: 09/14/2013 Elsevier  Interactive Patient Education 2016 [employer] Inc.  

## 2015-06-14 ENCOUNTER — Encounter: Payer: Self-pay | Admitting: Family Medicine

## 2015-06-14 MED ORDER — ALPRAZOLAM 0.25 MG PO TABS: 0.2500 mg | ORAL_TABLET | Freq: Every day | ORAL | Status: DC | PRN

## 2015-06-14 NOTE — Telephone Encounter (Signed)
Chartered loss adjuster.  Called the patient informed refill done, but must pickup hardcopy of Alprazolam at the front desk after completing controlled medication contract.  The patient did agree to instructions.

## 2015-06-14 NOTE — Telephone Encounter (Signed)
Printed and on counter for [facility].

## 2015-06-14 NOTE — Telephone Encounter (Signed)
OK to fill have her sign contract

## 2015-07-15 DIAGNOSIS — H04123 Dry eye syndrome of bilateral lacrimal glands: Secondary | ICD-10-CM | POA: Diagnosis not present

## 2015-07-15 DIAGNOSIS — H25013 Cortical age-related cataract, bilateral: Secondary | ICD-10-CM | POA: Diagnosis not present

## 2015-07-15 DIAGNOSIS — H2513 Age-related nuclear cataract, bilateral: Secondary | ICD-10-CM | POA: Diagnosis not present

## 2015-07-19 DIAGNOSIS — N8111 Cystocele, midline: Secondary | ICD-10-CM | POA: Diagnosis not present

## 2015-08-22 ENCOUNTER — Ambulatory Visit: Payer: Commercial Managed Care - HMO

## 2015-09-24 ENCOUNTER — Encounter: Payer: Self-pay | Admitting: Family Medicine

## 2015-09-24 ENCOUNTER — Ambulatory Visit (HOSPITAL_BASED_OUTPATIENT_CLINIC_OR_DEPARTMENT_OTHER)
Admission: RE | Admit: 2015-09-24 | Discharge: 2015-09-24 | Disposition: A | Discharge status: Home / Self Care | Payer: Commercial Managed Care - HMO | Source: Ambulatory Visit | Attending: Family Medicine | Admitting: Family Medicine

## 2015-09-24 ENCOUNTER — Ambulatory Visit (INDEPENDENT_AMBULATORY_CARE_PROVIDER_SITE_OTHER): Payer: Commercial Managed Care - HMO | Admitting: Family Medicine

## 2015-09-24 VITALS — BP 120/80 | HR 80 | Temp 98.3°F | Ht 61.0 in | Wt 141.5 lb

## 2015-09-24 DIAGNOSIS — F418 Other specified anxiety disorders: Secondary | ICD-10-CM

## 2015-09-24 DIAGNOSIS — E785 Hyperlipidemia, unspecified: Secondary | ICD-10-CM

## 2015-09-24 DIAGNOSIS — M8580 Other specified disorders of bone density and structure, unspecified site: Secondary | ICD-10-CM

## 2015-09-24 DIAGNOSIS — K219 Gastro-esophageal reflux disease without esophagitis: Secondary | ICD-10-CM

## 2015-09-24 DIAGNOSIS — Z780 Asymptomatic menopausal state: Secondary | ICD-10-CM

## 2015-09-24 DIAGNOSIS — M810 Age-related osteoporosis without current pathological fracture: Secondary | ICD-10-CM | POA: Insufficient documentation

## 2015-09-24 DIAGNOSIS — M85851 Other specified disorders of bone density and structure, right thigh: Secondary | ICD-10-CM | POA: Diagnosis not present

## 2015-09-24 DIAGNOSIS — N8110 Cystocele, unspecified: Secondary | ICD-10-CM

## 2015-09-24 DIAGNOSIS — I10 Essential (primary) hypertension: Secondary | ICD-10-CM | POA: Insufficient documentation

## 2015-09-24 DIAGNOSIS — M25552 Pain in left hip: Secondary | ICD-10-CM

## 2015-09-24 DIAGNOSIS — F419 Anxiety disorder, unspecified: Secondary | ICD-10-CM

## 2015-09-24 DIAGNOSIS — F329 Major depressive disorder, single episode, unspecified: Secondary | ICD-10-CM

## 2015-09-24 DIAGNOSIS — F32A Depression, unspecified: Secondary | ICD-10-CM

## 2015-09-24 MED ORDER — ALPRAZOLAM 0.25 MG PO TABS: 0.2500 mg | ORAL_TABLET | Freq: Every day | ORAL | Status: DC | PRN

## 2015-09-24 NOTE — Assessment & Plan Note (Signed)
Well controlled, no changes to meds. Encouraged heart healthy diet such as the DASH diet and exercise as tolerated.  °

## 2015-09-24 NOTE — Patient Instructions (Addendum)
Try Salonpas with lidocaine and aspercreme. Hypertension Hypertension, commonly called high blood pressure, is when the force of blood pumping through your arteries is too strong. Your arteries are the blood vessels that carry blood from your heart throughout your body. A blood pressure reading consists of a higher number over a lower number, such as 110/72. The higher number (systolic) is the pressure inside your arteries when your heart pumps. The lower number (diastolic) is the pressure inside your arteries when your heart relaxes. Ideally you want your blood pressure below 120/80. Hypertension forces your heart to work harder to pump blood. Your arteries may become narrow or stiff. Having untreated or uncontrolled hypertension can cause heart attack, stroke, kidney disease, and other problems. RISK FACTORS Some risk factors for high blood pressure are controllable. Others are not.  Risk factors you cannot control include:   Race. You may be at higher risk if you are African American.  Age. Risk increases with age.  Gender. Men are at higher risk than women before age 73 years. After age 58, women are at higher risk than men. Risk factors you can control include:  Not getting enough exercise or physical activity.  Being overweight.  Getting too much fat, sugar, calories, or salt in your diet.  Drinking too much alcohol. SIGNS AND SYMPTOMS Hypertension does not usually cause signs or symptoms. Extremely high blood pressure (hypertensive crisis) may cause headache, anxiety, shortness of breath, and nosebleed. DIAGNOSIS To check if you have hypertension, your health care provider will measure your blood pressure while you are seated, with your arm held at the level of your heart. It should be measured at least twice using the same arm. Certain conditions can cause a difference in blood pressure between your right and left arms. A blood pressure reading that is higher than normal on one  occasion does not mean that you need treatment. If it is not clear whether you have high blood pressure, you may be asked to return on a different day to have your blood pressure checked again. Or, you may be asked to monitor your blood pressure at home for 1 or more weeks. TREATMENT Treating high blood pressure includes making lifestyle changes and possibly taking medicine. Living a healthy lifestyle can help lower high blood pressure. You may need to change some of your habits. Lifestyle changes may include:  Following the DASH diet. This diet is high in fruits, vegetables, and whole grains. It is low in salt, red meat, and added sugars.  Keep your sodium intake below 2,300 mg per day.  Getting at least 30-45 minutes of aerobic exercise at least 4 times per week.  Losing weight if necessary.  Not smoking.  Limiting alcoholic beverages.  Learning ways to reduce stress. Your health care provider may prescribe medicine if lifestyle changes are not enough to get your blood pressure under control, and if one of the following is true:  You are 36-16 years of age and your systolic blood pressure is above 140.  You are 12 years of age or older, and your systolic blood pressure is above 150.  Your diastolic blood pressure is above 90.  You have diabetes, and your systolic blood pressure is over [id number] or your diastolic blood pressure is over 90.  You have kidney disease and your blood pressure is above 140/90.  You have heart disease and your blood pressure is above 140/90. Your personal target blood pressure may vary depending on your medical conditions, your age,  and other factors. HOME CARE INSTRUCTIONS  Have your blood pressure rechecked as directed by your health care provider.   Take medicines only as directed by your health care provider. Follow the directions carefully. Blood pressure medicines must be taken as prescribed. The medicine does not work as well when you skip doses.  Skipping doses also puts you at risk for problems.  Do not smoke.   Monitor your blood pressure at home as directed by your health care provider. SEEK MEDICAL CARE IF:   You think you are having a reaction to medicines taken.  You have recurrent headaches or feel dizzy.  You have swelling in your ankles.  You have trouble with your vision. SEEK IMMEDIATE MEDICAL CARE IF:  You develop a severe headache or confusion.  You have unusual weakness, numbness, or feel faint.  You have severe chest or abdominal pain.  You vomit repeatedly.  You have trouble breathing. MAKE SURE YOU:   Understand these instructions.  Will watch your condition.  Will get help right away if you are not doing well or get worse.   This information is not intended to replace advice given to you by your health care provider. Make sure you discuss any questions you have with your health care provider.   Document Released: 06/15/2005 Document Revised: 10/30/2014 Document Reviewed: 04/07/2013 Elsevier Interactive Patient Education [employer].

## 2015-09-24 NOTE — Progress Notes (Signed)
Subjective:    Patient ID: Sophia [name], female    DOB: 1941/06/01, 75 y.o.   MRN: [id number]  Chief Complaint  Patient presents with  . Follow-up    HPI Patient is in today for follow up. Patient has some concerns with right thumb, very painful at the joint area more painful with bending.  Patient has been taking care of mom and has some anxiety or stress from taking care of loved one.  Patient still has been having issues with hip pain but has learned to manage hip pain well. Denies CP/palp/SOB/HA/congestion/fevers/GI or GU c/o. Taking meds as prescribed   Past Medical History  Diagnosis Date  . Arthritis     hands, knees, etc  . GERD (gastroesophageal reflux disease)   . Hypertension   . Hyperlipidemia   . History of kidney stones   . H/O hematuria     for several years, work only revealed kidney stone on right side  . Diverticulitis   . History of chicken pox   . Anxiety and depression   . SCC (squamous cell carcinoma)     arms  . Bowen's disease   . Osteopenia 06/26/2014  . Bladder prolapse, female, acquired 09/16/2014    Past Surgical History  Procedure Laterality Date  . Cholecystectomy  2005  . Skin biopsy      multiple    Family History  Problem Relation Age of Onset  . Heart disease Mother     aortic stenosis  . Hyperlipidemia Mother   . Hypertension Mother   . Heart attack Mother   . Cancer Mother     BCC, SCC, skin  . Hypertension Father   . Cancer Father     SCC, BCC, skin  . Early death Daughter   . Birth defects Maternal Uncle   . Heart disease Maternal Grandfather     MI  . Tuberculosis Paternal Grandmother   . Cancer Paternal Grandfather     head and neck cancer, chewed tobacco  . Hypertension Daughter   . Hypertension Brother   . Hyperlipidemia Brother     Social History   Social History  . Marital Status: Single    Spouse Name: N/A  . Number of Children: N/A  . Years of Education: N/A   Occupational History  . Not on  file.   Social History Main Topics  . Smoking status: Never Smoker   . Smokeless tobacco: Not on file  . Alcohol Use: No  . Drug Use: No  . Sexual Activity: No     Comment: divorced, widowed, lives with elderly parents, is the caregiver, no dietary restrictions.    Other Topics Concern  . Not on file   Social History Narrative    Outpatient Prescriptions Prior to Visit  Medication Sig Dispense Refill  . aspirin 81 MG tablet Take 81 mg by mouth 3 (three) times a week.    . B Complex-C (SUPER B COMPLEX) TABS Take 1 tablet by mouth daily.    . carvedilol (COREG) 6.25 MG tablet TAKE 1 TABLET(6.25 MG) BY MOUTH TWICE DAILY WITH A MEAL 60 tablet 6  . Coenzyme Q10 (COQ-10) 200 MG CAPS Take 1 capsule by mouth daily.    . Omega-3 Fatty Acids (FISH OIL) 1200 MG CAPS Take 1 capsule by mouth 2 (two) times daily.    [name] OVER THE COUNTER MEDICATION Citrical with Vit D3 682m/500IU-Take 1 capsule by mouth daily.    . pneumococcal 13-valent conjugate vaccine (PREVNAR  13) SUSP injection Inject 0.5 mLs into the muscle tomorrow at 10 am. 0.5 mL 0  . Probiotic Product (PROBIOTIC DAILY PO) Take by mouth daily.    . Red Yeast Rice 600 MG CAPS Take 1 capsule by mouth daily.    [name] tiZANidine (ZANAFLEX) 2 MG tablet TAKE 1 TABLET(2 MG) BY MOUTH AT BEDTIME AS NEEDED FOR MUSCLE SPASMS 90 tablet 0  . zoster vaccine live, PF, (ZOSTAVAX) 76734 UNT/0.65ML injection Inject 19,400 Units into the skin once. 1 each 0  . ALPRAZolam (XANAX) 0.25 MG tablet Take 1 tablet (0.25 mg total) by mouth daily as needed for anxiety or sleep. 30 tablet 0   No facility-administered medications prior to visit.    Allergies  Allergen Reactions  . Morphine And Related Swelling  . Statins Other (See Comments)    Myalgias, weakness  . Sulfa Antibiotics Swelling and Rash    Review of Systems  Constitutional: Negative for fever and malaise/fatigue.  HENT: Negative for congestion.   Eyes: Negative for blurred vision.  Respiratory:  Negative for shortness of breath.   Cardiovascular: Negative for chest pain, palpitations and leg swelling.  Gastrointestinal: Negative for nausea, abdominal pain and blood in stool.  Genitourinary: Negative for dysuria and frequency.  Musculoskeletal: Negative for falls.  Skin: Negative for rash.  Neurological: Negative for dizziness, loss of consciousness and headaches.  Endo/Heme/Allergies: Negative for environmental allergies.  Psychiatric/Behavioral: Negative for depression. The patient is nervous/anxious.        Objective:    Physical Exam  Constitutional: She is oriented to person, place, and time. She appears well-developed and well-nourished. No distress.  HENT:  Head: Normocephalic and atraumatic.  Eyes: Conjunctivae are normal.  Neck: Neck supple. No thyromegaly present.  Cardiovascular: Normal rate, regular rhythm and normal heart sounds.   No murmur heard. Pulmonary/Chest: Effort normal and breath sounds normal. No respiratory distress.  Abdominal: Soft. Bowel sounds are normal. She exhibits no distension and no mass. There is no tenderness.  Musculoskeletal: She exhibits no edema.  Lymphadenopathy:    She has no cervical adenopathy.  Neurological: She is alert and oriented to person, place, and time.  Skin: Skin is warm and dry.  Psychiatric: She has a normal mood and affect. Her behavior is normal.    BP 120/80 mmHg  Pulse 80  Temp(Src) 98.3 F (36.8 C) (Oral)  Ht _0  (1.549 m)  Wt 141 lb 8 oz (64.184 kg)  BMI 26.75 kg/m2  SpO2 97% Wt Readings from Last 3 Encounters:  09/24/15 141 lb 8 oz (64.184 kg)  06/13/15 139 lb 3.2 oz (63.141 kg)  05/29/15 140 lb (63.504 kg)     Lab Results  Component Value Date   WBC 7.5 03/12/2015   HGB 12.3 03/12/2015   HCT 37.0 03/12/2015   PLT 183.0 03/12/2015   GLUCOSE 86 03/12/2015   CHOL 213* 03/12/2015   TRIG 158.0* 03/12/2015   HDL 51.70 03/12/2015   LDLCALC 130* 03/12/2015   ALT 50* 03/12/2015   AST 25  03/12/2015   NA 140 03/12/2015   K 4.2 03/12/2015   CL 104 03/12/2015   CREATININE 0.72 03/12/2015   BUN 10 03/12/2015   CO2 25 03/12/2015   TSH 1.22 03/12/2015    Lab Results  Component Value Date   TSH 1.22 03/12/2015   Lab Results  Component Value Date   WBC 7.5 03/12/2015   HGB 12.3 03/12/2015   HCT 37.0 03/12/2015   MCV 94.1 03/12/2015   PLT  183.0 03/12/2015   Lab Results  Component Value Date   NA 140 03/12/2015   K 4.2 03/12/2015   CO2 25 03/12/2015   GLUCOSE 86 03/12/2015   BUN 10 03/12/2015   CREATININE 0.72 03/12/2015   BILITOT 0.6 03/12/2015   ALKPHOS 61 03/12/2015   AST 25 03/12/2015   ALT 50* 03/12/2015   PROT 6.6 03/12/2015   ALBUMIN 4.2 03/12/2015   CALCIUM 9.3 03/12/2015   GFR 84.44 03/12/2015   Lab Results  Component Value Date   CHOL 213* 03/12/2015   Lab Results  Component Value Date   HDL 51.70 03/12/2015   Lab Results  Component Value Date   LDLCALC 130* 03/12/2015   Lab Results  Component Value Date   TRIG 158.0* 03/12/2015   Lab Results  Component Value Date   CHOLHDL 4 03/12/2015   No results found for: HGBA1C     Assessment & Plan:   Problem List Items Addressed This Visit    Anxiety and depression    Doing well, given refill on Alprazolam to use sparingly      Bladder prolapse, female, acquired    Dr Benjie Karvonen sees her every 3 months for management doing well      GERD (gastroesophageal reflux disease)    Avoid offending foods, start probiotics. Do not eat large meals in late evening and consider raising head of bed.       Relevant Orders   Lipid panel   VITAMIN D 25 Hydroxy (Vit-D Deficiency, Fractures)   DG Bone Density   Hip pain    Manages to care for her 74 year old mother with minimal pain, encouraged to stay active      Hyperlipidemia   Relevant Medications   ALPRAZolam (XANAX) 0.25 MG tablet   Other Relevant Orders   CBC   TSH   Comp Met (CMET)   Lipid panel   VITAMIN D 25 Hydroxy (Vit-D  Deficiency, Fractures)   DG Bone Density   Hypertension - Primary    Well controlled, no changes to meds. Encouraged heart healthy diet such as the DASH diet and exercise as tolerated.       Relevant Medications   ALPRAZolam (XANAX) 0.25 MG tablet   Other Relevant Orders   CBC   TSH   Comp Met (CMET)   Lipid panel   VITAMIN D 25 Hydroxy (Vit-D Deficiency, Fractures)   DG Bone Density   Osteopenia    Encouraged to get adequate exercise, calcium and vitamin d intake      Relevant Medications   ALPRAZolam (XANAX) 0.25 MG tablet   Other Relevant Orders   CBC   TSH   Comp Met (CMET)   Lipid panel   VITAMIN D 25 Hydroxy (Vit-D Deficiency, Fractures)   DG Bone Density    Other Visit Diagnoses    Postmenopausal estrogen deficiency        Relevant Orders    DG Bone Density       I am having Ms. Slavick maintain her aspirin, Fish Oil, OVER THE COUNTER MEDICATION, CoQ-10, Red Yeast Rice, SUPER B COMPLEX, Probiotic Product (PROBIOTIC DAILY PO), carvedilol, pneumococcal 13-valent conjugate vaccine, zoster vaccine live (PF), tiZANidine, and ALPRAZolam.  Meds ordered this encounter  Medications  . ALPRAZolam (XANAX) 0.25 MG tablet    Sig: Take 1 tablet (0.25 mg total) by mouth daily as needed for anxiety or sleep.    Dispense:  30 tablet    Refill:  0  Penni Homans, MD

## 2015-09-24 NOTE — Assessment & Plan Note (Signed)
Avoid offending foods, start probiotics. Do not eat large meals in late evening and consider raising head of bed.  

## 2015-09-24 NOTE — Progress Notes (Signed)
Pre visit review using our clinic review tool, if applicable. No additional management support is needed unless otherwise documented below in the visit note. 

## 2015-09-24 NOTE — Assessment & Plan Note (Signed)
Doing well, given refill on Alprazolam to use sparingly

## 2015-09-24 NOTE — Assessment & Plan Note (Signed)
Dr Benjie Karvonen sees her every 3 months for management doing well

## 2015-09-24 NOTE — Assessment & Plan Note (Signed)
Right thumb flared, consider tendonitis, try topical Salon Pas bid and if no improvement consider referral to orthopaedics.

## 2015-09-24 NOTE — Assessment & Plan Note (Signed)
Manages to care for her 74 year old mother with minimal pain, encouraged to stay active

## 2015-09-24 NOTE — Assessment & Plan Note (Signed)
Encouraged to get adequate exercise, calcium and vitamin d intake 

## 2015-09-25 LAB — COMPREHENSIVE METABOLIC PANEL WITH GFR
ALT: 15 U/L (ref 0–35)
AST: 20 U/L (ref 0–37)
Albumin: 4.2 g/dL (ref 3.5–5.2)
Alkaline Phosphatase: 59 U/L (ref 39–117)
BUN: 14 mg/dL (ref 6–23)
CO2: 30 meq/L (ref 19–32)
Calcium: 9.6 mg/dL (ref 8.4–10.5)
Chloride: 105 meq/L (ref 96–112)
Creatinine, Ser: 0.75 mg/dL (ref 0.40–1.20)
GFR: 80.43 mL/min
Glucose, Bld: 92 mg/dL (ref 70–99)
Potassium: 4 meq/L (ref 3.5–5.1)
Sodium: 143 meq/L (ref 135–145)
Total Bilirubin: 0.4 mg/dL (ref 0.2–1.2)
Total Protein: 6.6 g/dL (ref 6.0–8.3)

## 2015-09-25 LAB — LIPID PANEL
Cholesterol: 234 mg/dL — ABNORMAL HIGH (ref 0–200)
HDL: 50.7 mg/dL
NonHDL: 183.6
Total CHOL/HDL Ratio: 5
Triglycerides: 299 mg/dL — ABNORMAL HIGH (ref 0.0–149.0)
VLDL: 59.8 mg/dL — ABNORMAL HIGH (ref 0.0–40.0)

## 2015-09-25 LAB — CBC
HCT: 36.6 % (ref 36.0–46.0)
Hemoglobin: 12.2 g/dL (ref 12.0–15.0)
MCHC: 33.4 g/dL (ref 30.0–36.0)
MCV: 92.5 fl (ref 78.0–100.0)
Platelets: 188 10*3/uL (ref 150.0–400.0)
RBC: 3.95 Mil/uL (ref 3.87–5.11)
RDW: 14 % (ref 11.5–15.5)
WBC: 7.1 10*3/uL (ref 4.0–10.5)

## 2015-09-25 LAB — VITAMIN D 25 HYDROXY (VIT D DEFICIENCY, FRACTURES): VITD: 32.07 ng/mL (ref 30.00–100.00)

## 2015-09-25 LAB — TSH: TSH: 1.13 u[IU]/mL (ref 0.35–4.50)

## 2015-09-25 LAB — LDL CHOLESTEROL, DIRECT: LDL DIRECT: 130 mg/dL

## 2015-09-26 ENCOUNTER — Telehealth: Payer: Self-pay | Admitting: Family Medicine

## 2015-09-26 NOTE — Telephone Encounter (Signed)
This patients granddaughter just turned 38 and pediatrician no longer sees her.  This patients daughter Elisha Ponder the granddaughters mom/is your patient.  Would you be willing to take on the 74 year old granddaughter??

## 2015-09-26 NOTE — Telephone Encounter (Signed)
Yes I would see her. Please let me know if she needs an urgent visit and we will squeeze her in if not set up a new patient appt for future

## 2015-09-26 NOTE — Telephone Encounter (Signed)
Called informed the grandmother ok per PCP.  She will let her daughter know and they will schedule her a new patient appt. Granddaughter's name is Sandi Raveling.

## 2015-10-17 DIAGNOSIS — N8111 Cystocele, midline: Secondary | ICD-10-CM | POA: Diagnosis not present

## 2015-12-13 ENCOUNTER — Telehealth: Payer: Self-pay | Admitting: Family Medicine

## 2015-12-13 NOTE — Telephone Encounter (Signed)
Pt never picked up rx written on 06/14/2015, rx was shredded.controlled substance contract shredded as well

## 2016-01-13 ENCOUNTER — Other Ambulatory Visit: Payer: Self-pay | Admitting: Physician Assistant

## 2016-01-13 ENCOUNTER — Other Ambulatory Visit: Payer: Self-pay | Admitting: Family Medicine

## 2016-01-13 NOTE — Telephone Encounter (Signed)
Rx denied.  Already filled.//AB/[facility]

## 2016-01-23 DIAGNOSIS — N8111 Cystocele, midline: Secondary | ICD-10-CM | POA: Diagnosis not present

## 2016-03-17 ENCOUNTER — Other Ambulatory Visit: Payer: Self-pay | Admitting: Physician Assistant

## 2016-03-17 NOTE — Telephone Encounter (Signed)
Rx request to pharmacy/[facility]  

## 2016-04-02 ENCOUNTER — Encounter: Payer: Self-pay | Admitting: Family Medicine

## 2016-04-02 ENCOUNTER — Ambulatory Visit (INDEPENDENT_AMBULATORY_CARE_PROVIDER_SITE_OTHER): Payer: Commercial Managed Care - HMO | Admitting: Family Medicine

## 2016-04-02 VITALS — BP 126/80 | HR 67 | Temp 97.8°F | Wt 140.6 lb

## 2016-04-02 DIAGNOSIS — I10 Essential (primary) hypertension: Secondary | ICD-10-CM | POA: Diagnosis not present

## 2016-04-02 DIAGNOSIS — Z87448 Personal history of other diseases of urinary system: Secondary | ICD-10-CM | POA: Diagnosis not present

## 2016-04-02 DIAGNOSIS — N8110 Cystocele, unspecified: Secondary | ICD-10-CM

## 2016-04-02 DIAGNOSIS — Z0000 Encounter for general adult medical examination without abnormal findings: Secondary | ICD-10-CM | POA: Diagnosis not present

## 2016-04-02 DIAGNOSIS — G4762 Sleep related leg cramps: Secondary | ICD-10-CM | POA: Diagnosis not present

## 2016-04-02 DIAGNOSIS — E782 Mixed hyperlipidemia: Secondary | ICD-10-CM | POA: Diagnosis not present

## 2016-04-02 DIAGNOSIS — M810 Age-related osteoporosis without current pathological fracture: Secondary | ICD-10-CM | POA: Diagnosis not present

## 2016-04-02 DIAGNOSIS — K219 Gastro-esophageal reflux disease without esophagitis: Secondary | ICD-10-CM

## 2016-04-02 HISTORY — DX: Sleep related leg cramps: G47.62

## 2016-04-02 LAB — CBC
HCT: 38.3 % (ref 36.0–46.0)
HEMOGLOBIN: 12.9 g/dL (ref 12.0–15.0)
MCHC: 33.5 g/dL (ref 30.0–36.0)
MCV: 91.6 fl (ref 78.0–100.0)
Platelets: 181 10*3/uL (ref 150.0–400.0)
RBC: 4.19 Mil/uL (ref 3.87–5.11)
RDW: 14.3 % (ref 11.5–15.5)
WBC: 8.4 10*3/uL (ref 4.0–10.5)

## 2016-04-02 LAB — LDL CHOLESTEROL, DIRECT: Direct LDL: 173 mg/dL

## 2016-04-02 LAB — COMPREHENSIVE METABOLIC PANEL
ALBUMIN: 4.3 g/dL (ref 3.5–5.2)
ALT: 15 U/L (ref 0–35)
AST: 21 U/L (ref 0–37)
Alkaline Phosphatase: 61 U/L (ref 39–117)
BILIRUBIN TOTAL: 0.6 mg/dL (ref 0.2–1.2)
BUN: 12 mg/dL (ref 6–23)
CALCIUM: 9.4 mg/dL (ref 8.4–10.5)
CHLORIDE: 104 meq/L (ref 96–112)
CO2: 28 meq/L (ref 19–32)
CREATININE: 0.78 mg/dL (ref 0.40–1.20)
GFR: 76.76 mL/min (ref >60.00)
Glucose, Bld: 92 mg/dL (ref 70–99)
Potassium: 4.2 mEq/L (ref 3.5–5.1)
SODIUM: 140 meq/L (ref 135–145)
Total Protein: 7 g/dL (ref 6.0–8.3)

## 2016-04-02 LAB — LIPID PANEL
CHOLESTEROL: 267 mg/dL — AB (ref 0–200)
HDL: 60.1 mg/dL (ref >39.00)
NonHDL: 206.74
Total CHOL/HDL Ratio: 4
Triglycerides: 206 mg/dL — ABNORMAL HIGH (ref 0.0–149.0)
VLDL: 41.2 mg/dL — ABNORMAL HIGH (ref 0.0–40.0)

## 2016-04-02 LAB — VITAMIN D 25 HYDROXY (VIT D DEFICIENCY, FRACTURES): VITD: 47.01 ng/mL (ref 30.00–100.00)

## 2016-04-02 LAB — TSH: TSH: 1.12 u[IU]/mL (ref 0.35–4.50)

## 2016-04-02 LAB — MAGNESIUM: Magnesium: 1.8 mg/dL (ref 1.5–2.5)

## 2016-04-02 NOTE — Assessment & Plan Note (Signed)
Well controlled, no changes to meds. Encouraged heart healthy diet such as the DASH diet and exercise as tolerated.  °

## 2016-04-02 NOTE — Assessment & Plan Note (Addendum)
Encouraged to get adequate exercise, calcium and vitamin d intake, patient declines Fosamax for now. Offered referral offered for endocrinology for today

## 2016-04-02 NOTE — Assessment & Plan Note (Signed)
Avoid offending foods, take probiotics. Do not eat large meals in late evening and consider raising head of bed.  

## 2016-04-02 NOTE — Assessment & Plan Note (Signed)
Encouraged heart healthy diet, increase exercise, avoid trans fats, consider a krill oil cap daily 

## 2016-04-02 NOTE — Progress Notes (Signed)
Pre visit review using our clinic review tool, if applicable. No additional management support is needed unless otherwise documented below in the visit note. 

## 2016-04-02 NOTE — Patient Instructions (Signed)
Watch sodium, elevate feet and compression hose, light weight, knee hi 10- 20 mmhg, Jobst stockings   Preventive Care for Adults, Female A healthy lifestyle and preventive care can promote health and wellness. Preventive health guidelines for women include the following key practices.  A routine yearly physical is a good way to check with your health care provider about your health and preventive screening. It is a chance to share any concerns and updates on your health and to receive a thorough exam.  Visit your dentist for a routine exam and preventive care every 6 months. Brush your teeth twice a day and floss once a day. Good oral hygiene prevents tooth decay and gum disease.  The frequency of eye exams is based on your age, health, family medical history, use of contact lenses, and other factors. Follow your health care provider's recommendations for frequency of eye exams.  Eat a healthy diet. Foods like vegetables, fruits, whole grains, low-fat dairy products, and lean protein foods contain the nutrients you need without too many calories. Decrease your intake of foods high in solid fats, added sugars, and salt. Eat the right amount of calories for you.Get information about a proper diet from your health care provider, if necessary.  Regular physical exercise is one of the most important things you can do for your health. Most adults should get at least 150 minutes of moderate-intensity exercise (any activity that increases your heart rate and causes you to sweat) each week. In addition, most adults need muscle-strengthening exercises on 2 or more days a week.  Maintain a healthy weight. The body mass index (BMI) is a screening tool to identify possible weight problems. It provides an estimate of body fat based on height and weight. Your health care provider can find your BMI and can help you achieve or maintain a healthy weight.For adults 20 years and older:  A BMI below 18.5 is considered  underweight.  A BMI of 18.5 to 24.9 is normal.  A BMI of 25 to 29.9 is considered overweight.  A BMI of 30 and above is considered obese.  Maintain normal blood lipids and cholesterol levels by exercising and minimizing your intake of saturated fat. Eat a balanced diet with plenty of fruit and vegetables. Blood tests for lipids and cholesterol should begin at age 60 and be repeated every 5 years. If your lipid or cholesterol levels are high, you are over 50, or you are at high risk for heart disease, you may need your cholesterol levels checked more frequently.Ongoing high lipid and cholesterol levels should be treated with medicines if diet and exercise are not working.  If you smoke, find out from your health care provider how to quit. If you do not use tobacco, do not start.  Lung cancer screening is recommended for adults aged 61-80 years who are at high risk for developing lung cancer because of a history of smoking. A yearly low-dose CT scan of the lungs is recommended for people who have at least a 30-pack-year history of smoking and are a current smoker or have quit within the past 15 years. A pack year of smoking is smoking an average of 1 pack of cigarettes a day for 1 year (for example: 1 pack a day for 30 years or 2 packs a day for 15 years). Yearly screening should continue until the smoker has stopped smoking for at least 15 years. Yearly screening should be stopped for people who develop a health problem that would prevent  them from having lung cancer treatment.  If you are pregnant, do not drink alcohol. If you are breastfeeding, be very cautious about drinking alcohol. If you are not pregnant and choose to drink alcohol, do not have more than 1 drink per day. One drink is considered to be 12 ounces (355 mL) of beer, 5 ounces (148 mL) of wine, or 1.5 ounces (44 mL) of liquor.  Avoid use of street drugs. Do not share needles with anyone. Ask for help if you need support or  instructions about stopping the use of drugs.  High blood pressure causes heart disease and increases the risk of stroke. Your blood pressure should be checked at least every 1 to 2 years. Ongoing high blood pressure should be treated with medicines if weight loss and exercise do not work.  If you are 21-58 years old, ask your health care provider if you should take aspirin to prevent strokes.  Diabetes screening is done by taking a blood sample to check your blood glucose level after you have not eaten for a certain period of time (fasting). If you are not overweight and you do not have risk factors for diabetes, you should be screened once every 3 years starting at age 84. If you are overweight or obese and you are 93-20 years of age, you should be screened for diabetes every year as part of your cardiovascular risk assessment.  Breast cancer screening is essential preventive care for women. You should practice "breast self-awareness." This means understanding the normal appearance and feel of your breasts and may include breast self-examination. Any changes detected, no matter how small, should be reported to a health care provider. Women in their 40s and 30s should have a clinical breast exam (CBE) by a health care provider as part of a regular health exam every 1 to 3 years. After age 62, women should have a CBE every year. Starting at age 23, women should consider having a mammogram (breast X-ray test) every year. Women who have a family history of breast cancer should talk to their health care provider about genetic screening. Women at a high risk of breast cancer should talk to their health care providers about having an MRI and a mammogram every year.  Breast cancer gene (BRCA)-related cancer risk assessment is recommended for women who have family members with BRCA-related cancers. BRCA-related cancers include breast, ovarian, tubal, and peritoneal cancers. Having family members with these  cancers may be associated with an increased risk for harmful changes (mutations) in the breast cancer genes BRCA1 and BRCA2. Results of the assessment will determine the need for genetic counseling and BRCA1 and BRCA2 testing.  Your health care provider may recommend that you be screened regularly for cancer of the pelvic organs (ovaries, uterus, and vagina). This screening involves a pelvic examination, including checking for microscopic changes to the surface of your cervix (Pap test). You may be encouraged to have this screening done every 3 years, beginning at age 69.  For women ages 23-65, health care providers may recommend pelvic exams and Pap testing every 3 years, or they may recommend the Pap and pelvic exam, combined with testing for human papilloma virus (HPV), every 5 years. Some types of HPV increase your risk of cervical cancer. Testing for HPV may also be done on women of any age with unclear Pap test results.  Other health care providers may not recommend any screening for nonpregnant women who are considered low risk for pelvic cancer and  who do not have symptoms. Ask your health care provider if a screening pelvic exam is right for you.  If you have had past treatment for cervical cancer or a condition that could lead to cancer, you need Pap tests and screening for cancer for at least 20 years after your treatment. If Pap tests have been discontinued, your risk factors (such as having a new sexual partner) need to be reassessed to determine if screening should resume. Some women have medical problems that increase the chance of getting cervical cancer. In these cases, your health care provider may recommend more frequent screening and Pap tests.  Colorectal cancer can be detected and often prevented. Most routine colorectal cancer screening begins at the age of 69 years and continues through age 49 years. However, your health care provider may recommend screening at an earlier age if you  have risk factors for colon cancer. On a yearly basis, your health care provider may provide home test kits to check for hidden blood in the stool. Use of a small camera at the end of a tube, to directly examine the colon (sigmoidoscopy or colonoscopy), can detect the earliest forms of colorectal cancer. Talk to your health care provider about this at age 6, when routine screening begins. Direct exam of the colon should be repeated every 5-10 years through age 41 years, unless early forms of precancerous polyps or small growths are found.  People who are at an increased risk for hepatitis B should be screened for this virus. You are considered at high risk for hepatitis B if:  You were born in a country where hepatitis B occurs often. Talk with your health care provider about which countries are considered high risk.  Your parents were born in a high-risk country and you have not received a shot to protect against hepatitis B (hepatitis B vaccine).  You have HIV or AIDS.  You use needles to inject street drugs.  You live with, or have sex with, someone who has hepatitis B.  You get hemodialysis treatment.  You take certain medicines for conditions like cancer, organ transplantation, and autoimmune conditions.  Hepatitis C blood testing is recommended for all people born from [birth year] through 1965 and any individual with known risks for hepatitis C.  Practice safe sex. Use condoms and avoid high-risk sexual practices to reduce the spread of sexually transmitted infections (STIs). STIs include gonorrhea, chlamydia, syphilis, trichomonas, herpes, HPV, and human immunodeficiency virus (HIV). Herpes, HIV, and HPV are viral illnesses that have no cure. They can result in disability, cancer, and death.  You should be screened for sexually transmitted illnesses (STIs) including gonorrhea and chlamydia if:  You are sexually active and are younger than 24 years.  You are older than 24 years and your  health care provider tells you that you are at risk for this type of infection.  Your sexual activity has changed since you were last screened and you are at an increased risk for chlamydia or gonorrhea. Ask your health care provider if you are at risk.  If you are at risk of being infected with HIV, it is recommended that you take a prescription medicine daily to prevent HIV infection. This is called preexposure prophylaxis (PrEP). You are considered at risk if:  You are sexually active and do not regularly use condoms or know the HIV status of your partner(s).  You take drugs by injection.  You are sexually active with a partner who has HIV.  Talk  with your health care provider about whether you are at high risk of being infected with HIV. If you choose to begin PrEP, you should first be tested for HIV. You should then be tested every 3 months for as long as you are taking PrEP.  Osteoporosis is a disease in which the bones lose minerals and strength with aging. This can result in serious bone fractures or breaks. The risk of osteoporosis can be identified using a bone density scan. Women ages 37 years and over and women at risk for fractures or osteoporosis should discuss screening with their health care providers. Ask your health care provider whether you should take a calcium supplement or vitamin D to reduce the rate of osteoporosis.  Menopause can be associated with physical symptoms and risks. Hormone replacement therapy is available to decrease symptoms and risks. You should talk to your health care provider about whether hormone replacement therapy is right for you.  Use sunscreen. Apply sunscreen liberally and repeatedly throughout the day. You should seek shade when your shadow is shorter than you. Protect yourself by wearing long sleeves, pants, a wide-brimmed hat, and sunglasses year round, whenever you are outdoors.  Once a month, do a whole body skin exam, using a mirror to look  at the skin on your back. Tell your health care provider of new moles, moles that have irregular borders, moles that are larger than a pencil eraser, or moles that have changed in shape or color.  Stay current with required vaccines (immunizations).  Influenza vaccine. All adults should be immunized every year.  Tetanus, diphtheria, and acellular pertussis (Td, Tdap) vaccine. Pregnant women should receive 1 dose of Tdap vaccine during each pregnancy. The dose should be obtained regardless of the length of time since the last dose. Immunization is preferred during the 27th-36th week of gestation. An adult who has not previously received Tdap or who does not know her vaccine status should receive 1 dose of Tdap. This initial dose should be followed by tetanus and diphtheria toxoids (Td) booster doses every 10 years. Adults with an unknown or incomplete history of completing a 3-dose immunization series with Td-containing vaccines should begin or complete a primary immunization series including a Tdap dose. Adults should receive a Td booster every 10 years.  Varicella vaccine. An adult without evidence of immunity to varicella should receive 2 doses or a second dose if she has previously received 1 dose. Pregnant females who do not have evidence of immunity should receive the first dose after pregnancy. This first dose should be obtained before leaving the health care facility. The second dose should be obtained 4-8 weeks after the first dose.  Human papillomavirus (HPV) vaccine. Females aged 13-26 years who have not received the vaccine previously should obtain the 3-dose series. The vaccine is not recommended for use in pregnant females. However, pregnancy testing is not needed before receiving a dose. If a female is found to be pregnant after receiving a dose, no treatment is needed. In that case, the remaining doses should be delayed until after the pregnancy. Immunization is recommended for any person  with an immunocompromised condition through the age of 49 years if she did not get any or all doses earlier. During the 3-dose series, the second dose should be obtained 4-8 weeks after the first dose. The third dose should be obtained 24 weeks after the first dose and 16 weeks after the second dose.  Zoster vaccine. One dose is recommended for adults aged  60 years or older unless certain conditions are present.  Measles, mumps, and rubella (MMR) vaccine. Adults born before [birth year] generally are considered immune to measles and mumps. Adults born in [birth year] or later should have 1 or more doses of MMR vaccine unless there is a contraindication to the vaccine or there is laboratory evidence of immunity to each of the three diseases. A routine second dose of MMR vaccine should be obtained at least 28 days after the first dose for students attending postsecondary schools, health care workers, or international travelers. People who received inactivated measles vaccine or an unknown type of measles vaccine during 1963-1967 should receive 2 doses of MMR vaccine. People who received inactivated mumps vaccine or an unknown type of mumps vaccine before 1979 and are at high risk for mumps infection should consider immunization with 2 doses of MMR vaccine. For females of childbearing age, rubella immunity should be determined. If there is no evidence of immunity, females who are not pregnant should be vaccinated. If there is no evidence of immunity, females who are pregnant should delay immunization until after pregnancy. Unvaccinated health care workers born before [birth year] who lack laboratory evidence of measles, mumps, or rubella immunity or laboratory confirmation of disease should consider measles and mumps immunization with 2 doses of MMR vaccine or rubella immunization with 1 dose of MMR vaccine.  Pneumococcal 13-valent conjugate (PCV13) vaccine. When indicated, a person who is uncertain of his immunization history and has  no record of immunization should receive the PCV13 vaccine. All adults 36 years of age and older should receive this vaccine. An adult aged 63 years or older who has certain medical conditions and has not been previously immunized should receive 1 dose of PCV13 vaccine. This PCV13 should be followed with a dose of pneumococcal polysaccharide (PPSV23) vaccine. Adults who are at high risk for pneumococcal disease should obtain the PPSV23 vaccine at least 8 weeks after the dose of PCV13 vaccine. Adults older than 74 years of age who have normal immune system function should obtain the PPSV23 vaccine dose at least 1 year after the dose of PCV13 vaccine.  Pneumococcal polysaccharide (PPSV23) vaccine. When PCV13 is also indicated, PCV13 should be obtained first. All adults aged 67 years and older should be immunized. An adult younger than age 28 years who has certain medical conditions should be immunized. Any person who resides in a nursing home or long-term care facility should be immunized. An adult smoker should be immunized. People with an immunocompromised condition and certain other conditions should receive both PCV13 and PPSV23 vaccines. People with human immunodeficiency virus (HIV) infection should be immunized as soon as possible after diagnosis. Immunization during chemotherapy or radiation therapy should be avoided. Routine use of PPSV23 vaccine is not recommended for American Indians, [place] Natives, or people younger than 65 years unless there are medical conditions that require PPSV23 vaccine. When indicated, people who have unknown immunization and have no record of immunization should receive PPSV23 vaccine. One-time revaccination 5 years after the first dose of PPSV23 is recommended for people aged 19-64 years who have chronic kidney failure, nephrotic syndrome, asplenia, or immunocompromised conditions. People who received 1-2 doses of PPSV23 before age 70 years should receive another dose of PPSV23  vaccine at age 19 years or later if at least 5 years have passed since the previous dose. Doses of PPSV23 are not needed for people immunized with PPSV23 at or after age 57 years.  Meningococcal vaccine. Adults with asplenia or persistent  complement component deficiencies should receive 2 doses of quadrivalent meningococcal conjugate (MenACWY-D) vaccine. The doses should be obtained at least 2 months apart. Microbiologists working with certain meningococcal bacteria, [employer] recruits, people at risk during an outbreak, and people who travel to or live in countries with a high rate of meningitis should be immunized. A first-year college student up through age 1 years who is living in a residence hall should receive a dose if she did not receive a dose on or after her 16th birthday. Adults who have certain high-risk conditions should receive one or more doses of vaccine.  Hepatitis A vaccine. Adults who wish to be protected from this disease, have certain high-risk conditions, work with hepatitis A-infected animals, work in hepatitis A research labs, or travel to or work in countries with a high rate of hepatitis A should be immunized. Adults who were previously unvaccinated and who anticipate close contact with an international adoptee during the first 60 days after arrival in the [place] from a country with a high rate of hepatitis A should be immunized.  Hepatitis B vaccine. Adults who wish to be protected from this disease, have certain high-risk conditions, may be exposed to blood or other infectious body fluids, are household contacts or sex partners of hepatitis B positive people, are clients or workers in certain care facilities, or travel to or work in countries with a high rate of hepatitis B should be immunized.  Haemophilus influenzae type b (Hib) vaccine. A previously unvaccinated person with asplenia or sickle cell disease or having a scheduled splenectomy should receive 1 dose of Hib  vaccine. Regardless of previous immunization, a recipient of a hematopoietic stem cell transplant should receive a 3-dose series 6-12 months after her successful transplant. Hib vaccine is not recommended for adults with HIV infection. Preventive Services / Frequency Ages 19 to 68 years  Blood pressure check.** / Every 3-5 years.  Lipid and cholesterol check.** / Every 5 years beginning at age 16.  Clinical breast exam.** / Every 3 years for women in their 32s and 18s.  BRCA-related cancer risk assessment.** / For women who have family members with a BRCA-related cancer (breast, ovarian, tubal, or peritoneal cancers).  Pap test.** / Every 2 years from ages 59 through 41. Every 3 years starting at age 27 through age 62 or 67 with a history of 3 consecutive normal Pap tests.  HPV screening.** / Every 3 years from ages 39 through ages 42 to 47 with a history of 3 consecutive normal Pap tests.  Hepatitis C blood test.** / For any individual with known risks for hepatitis C.  Skin self-exam. / Monthly.  Influenza vaccine. / Every year.  Tetanus, diphtheria, and acellular pertussis (Tdap, Td) vaccine.** / Consult your health care provider. Pregnant women should receive 1 dose of Tdap vaccine during each pregnancy. 1 dose of Td every 10 years.  Varicella vaccine.** / Consult your health care provider. Pregnant females who do not have evidence of immunity should receive the first dose after pregnancy.  HPV vaccine. / 3 doses over 6 months, if 25 and younger. The vaccine is not recommended for use in pregnant females. However, pregnancy testing is not needed before receiving a dose.  Measles, mumps, rubella (MMR) vaccine.** / You need at least 1 dose of MMR if you were born in 1957 or later. You may also need a 2nd dose. For females of childbearing age, rubella immunity should be determined. If there is no evidence of immunity,  females who are not pregnant should be vaccinated. If there is no  evidence of immunity, females who are pregnant should delay immunization until after pregnancy.  Pneumococcal 13-valent conjugate (PCV13) vaccine.** / Consult your health care provider.  Pneumococcal polysaccharide (PPSV23) vaccine.** / 1 to 2 doses if you smoke cigarettes or if you have certain conditions.  Meningococcal vaccine.** / 1 dose if you are age 35 to 11 years and a Market researcher living in a residence hall, or have one of several medical conditions, you need to get vaccinated against meningococcal disease. You may also need additional booster doses.  Hepatitis A vaccine.** / Consult your health care provider.  Hepatitis B vaccine.** / Consult your health care provider.  Haemophilus influenzae type b (Hib) vaccine.** / Consult your health care provider. Ages 86 to 46 years  Blood pressure check.** / Every year.  Lipid and cholesterol check.** / Every 5 years beginning at age 40 years.  Lung cancer screening. / Every year if you are aged 83-80 years and have a 30-pack-year history of smoking and currently smoke or have quit within the past 15 years. Yearly screening is stopped once you have quit smoking for at least 15 years or develop a health problem that would prevent you from having lung cancer treatment.  Clinical breast exam.** / Every year after age 2 years.  BRCA-related cancer risk assessment.** / For women who have family members with a BRCA-related cancer (breast, ovarian, tubal, or peritoneal cancers).  Mammogram.** / Every year beginning at age 29 years and continuing for as long as you are in good health. Consult with your health care provider.  Pap test.** / Every 3 years starting at age 56 years through age 18 or 43 years with a history of 3 consecutive normal Pap tests.  HPV screening.** / Every 3 years from ages 53 years through ages 51 to 71 years with a history of 3 consecutive normal Pap tests.  Fecal occult blood test (FOBT) of stool. /  Every year beginning at age 36 years and continuing until age 51 years. You may not need to do this test if you get a colonoscopy every 10 years.  Flexible sigmoidoscopy or colonoscopy.** / Every 5 years for a flexible sigmoidoscopy or every 10 years for a colonoscopy beginning at age 58 years and continuing until age 34 years.  Hepatitis C blood test.** / For all people born from [birth year] through 1964 and any individual with known risks for hepatitis C.  Skin self-exam. / Monthly.  Influenza vaccine. / Every year.  Tetanus, diphtheria, and acellular pertussis (Tdap/Td) vaccine.** / Consult your health care provider. Pregnant women should receive 1 dose of Tdap vaccine during each pregnancy. 1 dose of Td every 10 years.  Varicella vaccine.** / Consult your health care provider. Pregnant females who do not have evidence of immunity should receive the first dose after pregnancy.  Zoster vaccine.** / 1 dose for adults aged 23 years or older.  Measles, mumps, rubella (MMR) vaccine.** / You need at least 1 dose of MMR if you were born in 1957 or later. You may also need a second dose. For females of childbearing age, rubella immunity should be determined. If there is no evidence of immunity, females who are not pregnant should be vaccinated. If there is no evidence of immunity, females who are pregnant should delay immunization until after pregnancy.  Pneumococcal 13-valent conjugate (PCV13) vaccine.** / Consult your health care provider.  Pneumococcal polysaccharide (PPSV23) vaccine.** /  1 to 2 doses if you smoke cigarettes or if you have certain conditions.  Meningococcal vaccine.** / Consult your health care provider.  Hepatitis A vaccine.** / Consult your health care provider.  Hepatitis B vaccine.** / Consult your health care provider.  Haemophilus influenzae type b (Hib) vaccine.** / Consult your health care provider. Ages 41 years and over  Blood pressure check.** / Every year.  Lipid  and cholesterol check.** / Every 5 years beginning at age 16 years.  Lung cancer screening. / Every year if you are aged 53-80 years and have a 30-pack-year history of smoking and currently smoke or have quit within the past 15 years. Yearly screening is stopped once you have quit smoking for at least 15 years or develop a health problem that would prevent you from having lung cancer treatment.  Clinical breast exam.** / Every year after age 100 years.  BRCA-related cancer risk assessment.** / For women who have family members with a BRCA-related cancer (breast, ovarian, tubal, or peritoneal cancers).  Mammogram.** / Every year beginning at age 65 years and continuing for as long as you are in good health. Consult with your health care provider.  Pap test.** / Every 3 years starting at age 75 years through age 81 or 65 years with 3 consecutive normal Pap tests. Testing can be stopped between 65 and 70 years with 3 consecutive normal Pap tests and no abnormal Pap or HPV tests in the past 10 years.  HPV screening.** / Every 3 years from ages 53 years through ages 41 or 56 years with a history of 3 consecutive normal Pap tests. Testing can be stopped between 65 and 70 years with 3 consecutive normal Pap tests and no abnormal Pap or HPV tests in the past 10 years.  Fecal occult blood test (FOBT) of stool. / Every year beginning at age 66 years and continuing until age 93 years. You may not need to do this test if you get a colonoscopy every 10 years.  Flexible sigmoidoscopy or colonoscopy.** / Every 5 years for a flexible sigmoidoscopy or every 10 years for a colonoscopy beginning at age 79 years and continuing until age 56 years.  Hepatitis C blood test.** / For all people born from [birth year] through 1964 and any individual with known risks for hepatitis C.  Osteoporosis screening.** / A one-time screening for women ages 46 years and over and women at risk for fractures or osteoporosis.  Skin  self-exam. / Monthly.  Influenza vaccine. / Every year.  Tetanus, diphtheria, and acellular pertussis (Tdap/Td) vaccine.** / 1 dose of Td every 10 years.  Varicella vaccine.** / Consult your health care provider.  Zoster vaccine.** / 1 dose for adults aged 52 years or older.  Pneumococcal 13-valent conjugate (PCV13) vaccine.** / Consult your health care provider.  Pneumococcal polysaccharide (PPSV23) vaccine.** / 1 dose for all adults aged 4 years and older.  Meningococcal vaccine.** / Consult your health care provider.  Hepatitis A vaccine.** / Consult your health care provider.  Hepatitis B vaccine.** / Consult your health care provider.  Haemophilus influenzae type b (Hib) vaccine.** / Consult your health care provider. ** Family history and personal history of risk and conditions may change your health care provider's recommendations.   This information is not intended to replace advice given to you by your health care provider. Make sure you discuss any questions you have with your health care provider.   Document Released: 08/11/2001 Document Revised: 07/06/2014 Document Reviewed: 11/10/2010 Elsevier  Interactive Patient Education [employer].

## 2016-04-02 NOTE — Assessment & Plan Note (Signed)
Has had a pessary placed by Dr Benjie Karvonen and that is working well, gets checked every 4 months

## 2016-04-02 NOTE — Assessment & Plan Note (Signed)
Patient encouraged to maintain heart healthy diet, regular exercise, adequate sleep. Consider daily probiotics. Take medications as prescribed. Given and reviewed copy of ACP documents from [place] Secretary of State and encouraged to complete and return 

## 2016-04-02 NOTE — Assessment & Plan Note (Signed)
Work up in past was unremarkable no new concerns

## 2016-04-02 NOTE — Assessment & Plan Note (Signed)
Worse in evening. Walking 3 x a week. Tried taking magnesium powder in past, check magnesium level

## 2016-04-04 NOTE — Progress Notes (Signed)
Patient ID: Sophia [name], female   DOB: 30-Dec-1940, 75 y.o.   MRN: [id number]   Subjective:    Patient ID: Sophia [name], female    DOB: March 27, 1941, 75 y.o.   MRN: [id number]  Chief Complaint  Patient presents with  . Annual Exam    HPI Patient is in today for annual preventative exam and follow up on numerous medical conditions. No recent illness or hospitalizations. Is struggling with fatigue and swelling in her feet on long days, improves with lying down overnight. 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 Denies CP/palp/SOB/HA/congestion/fevers/GI or GU c/o. Taking meds as prescribed  Past Medical History:  Diagnosis Date  . Anxiety and depression   . Arthritis    hands, knees, etc  . Bladder prolapse, female, acquired 09/16/2014  . Bowen's disease   . Diverticulitis   . GERD (gastroesophageal reflux disease)   . H/O hematuria    for several years, work only revealed kidney stone on right side  . History of chicken pox   . History of kidney stones   . Hyperlipidemia   . Hypertension   . Leg cramps, sleep related 04/02/2016  . Osteopenia 06/26/2014  . Osteoporosis 06/26/2014  . SCC (squamous cell carcinoma)    arms    Past Surgical History:  Procedure Laterality Date  . CHOLECYSTECTOMY  2005  . SKIN BIOPSY     multiple    Family History  Problem Relation Age of Onset  . Heart disease Mother     aortic stenosis  . Hyperlipidemia Mother   . Hypertension Mother   . Heart attack Mother   . Cancer Mother     BCC, SCC, skin  . Hypertension Father   . Cancer Father     SCC, BCC, skin  . Early death Daughter   . Birth defects Maternal Uncle   . Heart disease Maternal Grandfather     MI  . Tuberculosis Paternal Grandmother   . Cancer Paternal Grandfather     head and neck cancer, chewed tobacco  . Hypertension Daughter   . Hypertension Brother   . Hyperlipidemia Brother     Social History    Social History  . Marital status: Single    Spouse name: N/A  . Number of children: N/A  . Years of education: N/A   Occupational History  . Not on file.   Social History Main Topics  . Smoking status: Never Smoker  . Smokeless tobacco: Not on file  . Alcohol use No  . Drug use: No  . Sexual activity: No     Comment: divorced, widowed, lives with elderly parents, is the caregiver, no dietary restrictions.    Other Topics Concern  . Not on file   Social History Narrative  . No narrative on file    Outpatient Medications Prior to Visit  Medication Sig Dispense Refill  . ALPRAZolam (XANAX) 0.25 MG tablet Take 1 tablet (0.25 mg total) by mouth daily as needed for anxiety or sleep. (Patient not taking: Reported on 04/02/2016) 30 tablet 0  . aspirin 81 MG tablet Take 81 mg by mouth 3 (three) times a week.    . B Complex-C (SUPER B COMPLEX) TABS Take 1 tablet by mouth daily.    . carvedilol (COREG) 6.25 MG tablet TAKE 1 TABLET(6.25 MG) BY MOUTH TWICE DAILY WITH A MEAL 60 tablet 0  . Coenzyme Q10 (COQ-10) 200 MG CAPS Take  1 capsule by mouth daily.    . Omega-3 Fatty Acids (FISH OIL) 1200 MG CAPS Take 1 capsule by mouth 2 (two) times daily.    [name] OVER THE COUNTER MEDICATION Citrical with Vit D3 630mg /500IU-Take 1 capsule by mouth daily.    . Probiotic Product (PROBIOTIC DAILY PO) Take by mouth daily.    . Red Yeast Rice 600 MG CAPS Take 1 capsule by mouth daily.    [name] tiZANidine (ZANAFLEX) 2 MG tablet TAKE 1 TABLET(2 MG) BY MOUTH AT BEDTIME AS NEEDED FOR MUSCLE SPASMS (Patient not taking: Reported on 04/02/2016) 90 tablet 0  . pneumococcal 13-valent conjugate vaccine (PREVNAR 13) SUSP injection Inject 0.5 mLs into the muscle tomorrow at 10 am. 0.5 mL 0  . zoster vaccine live, PF, (ZOSTAVAX) 60454 UNT/0.65ML injection Inject 19,400 Units into the skin once. 1 each 0   No facility-administered medications prior to visit.     Allergies  Allergen Reactions  . Morphine And Related  Swelling  . Statins Other (See Comments)    Myalgias, weakness  . Sulfa Antibiotics Swelling and Rash    Review of Systems  Constitutional: Positive for malaise/fatigue. Negative for chills and fever.  HENT: Negative for congestion and hearing loss.   Eyes: Negative for discharge.  Respiratory: Negative for cough, sputum production and shortness of breath.   Cardiovascular: Positive for leg swelling. Negative for chest pain and palpitations.  Gastrointestinal: Negative for abdominal pain, blood in stool, constipation, diarrhea, heartburn, nausea and vomiting.  Genitourinary: Negative for dysuria, frequency, hematuria and urgency.  Musculoskeletal: Negative for back pain, falls and myalgias.  Skin: Negative for rash.  Neurological: Positive for weakness. Negative for dizziness, sensory change, loss of consciousness and headaches.  Endo/Heme/Allergies: Negative for environmental allergies. Does not bruise/bleed easily.  Psychiatric/Behavioral: Negative for depression and suicidal ideas. The patient is not nervous/anxious and does not have insomnia.        Objective:    Physical Exam  Constitutional: She is oriented to person, place, and time. She appears well-developed and well-nourished. No distress.  HENT:  Head: Normocephalic and atraumatic.  Eyes: Conjunctivae are normal.  Neck: Neck supple. No thyromegaly present.  Cardiovascular: Normal rate, regular rhythm and normal heart sounds.   No murmur heard. Pulmonary/Chest: Effort normal and breath sounds normal. No respiratory distress.  Abdominal: Soft. Bowel sounds are normal. She exhibits no distension and no mass. There is no tenderness.  Musculoskeletal: She exhibits no edema.  Lymphadenopathy:    She has no cervical adenopathy.  Neurological: She is alert and oriented to person, place, and time.  Skin: Skin is warm and dry.  Psychiatric: She has a normal mood and affect. Her behavior is normal.    BP 126/80 (BP Location:  [name] Arm, Patient Position: Sitting, Cuff Size: Normal)   Pulse 67   Temp 97.8 F (36.6 C) (Oral)   Wt 140 lb 9.6 oz (63.8 kg)   SpO2 98%   BMI 26.57 kg/m  Wt Readings from Last 3 Encounters:  04/02/16 140 lb 9.6 oz (63.8 kg)  09/24/15 141 lb 8 oz (64.2 kg)  06/13/15 139 lb 3.2 oz (63.1 kg)     Lab Results  Component Value Date   WBC 8.4 04/02/2016   HGB 12.9 04/02/2016   HCT 38.3 04/02/2016   PLT 181.0 04/02/2016   GLUCOSE 92 04/02/2016   CHOL 267 (H) 04/02/2016   TRIG 206.0 (H) 04/02/2016   HDL 60.10 04/02/2016   LDLDIRECT 173.0 04/02/2016   LDLCALC  130 (H) 03/12/2015   ALT 15 04/02/2016   AST 21 04/02/2016   NA 140 04/02/2016   K 4.2 04/02/2016   CL 104 04/02/2016   CREATININE 0.78 04/02/2016   BUN 12 04/02/2016   CO2 28 04/02/2016   TSH 1.12 04/02/2016    Lab Results  Component Value Date   TSH 1.12 04/02/2016   Lab Results  Component Value Date   WBC 8.4 04/02/2016   HGB 12.9 04/02/2016   HCT 38.3 04/02/2016   MCV 91.6 04/02/2016   PLT 181.0 04/02/2016   Lab Results  Component Value Date   NA 140 04/02/2016   K 4.2 04/02/2016   CO2 28 04/02/2016   GLUCOSE 92 04/02/2016   BUN 12 04/02/2016   CREATININE 0.78 04/02/2016   BILITOT 0.6 04/02/2016   ALKPHOS 61 04/02/2016   AST 21 04/02/2016   ALT 15 04/02/2016   PROT 7.0 04/02/2016   ALBUMIN 4.3 04/02/2016   CALCIUM 9.4 04/02/2016   GFR 76.76 04/02/2016   Lab Results  Component Value Date   CHOL 267 (H) 04/02/2016   Lab Results  Component Value Date   HDL 60.10 04/02/2016   Lab Results  Component Value Date   LDLCALC 130 (H) 03/12/2015   Lab Results  Component Value Date   TRIG 206.0 (H) 04/02/2016   Lab Results  Component Value Date   CHOLHDL 4 04/02/2016   No results found for: HGBA1C     Assessment & Plan:   Problem List Items Addressed This Visit    GERD (gastroesophageal reflux disease)    Avoid offending foods, take probiotics. Do not eat large meals in late evening  and consider raising head of bed.       Hypertension    Well controlled, no changes to meds. Encouraged heart healthy diet such as the DASH diet and exercise as tolerated.       Relevant Orders   TSH (Completed)   CBC (Completed)   Comprehensive metabolic panel (Completed)   Hyperlipidemia    Encouraged heart healthy diet, increase exercise, avoid trans fats, consider a krill oil cap daily      Relevant Orders   Lipid panel (Completed)   H/O hematuria    Work up in past was unremarkable no new concerns      Osteoporosis    Encouraged to get adequate exercise, calcium and vitamin d intake, patient declines Fosamax for now. Offered referral offered for endocrinology for today      Bladder prolapse, female, acquired    Has had a pessary placed by Dr Benjie Karvonen and that is working well, gets checked every 4 months      Relevant Orders   Vitamin D (25 hydroxy) (Completed)   Preventative health care    Patient encouraged to maintain heart healthy diet, regular exercise, adequate sleep. Consider daily probiotics. Take medications as prescribed. Given and reviewed copy of ACP documents from [place] and encouraged to complete and return      Leg cramps, sleep related    Worse in evening. Walking 3 x a week. Tried taking magnesium powder in past, check magnesium level      Relevant Orders   Magnesium (Completed)    Other Visit Diagnoses   None.     I have discontinued Ms. Dekker's pneumococcal 13-valent conjugate vaccine and zoster vaccine live (PF). I am also having her maintain her aspirin, Fish Oil, OVER THE COUNTER MEDICATION, CoQ-10, Red Yeast Rice, SUPER B COMPLEX,  Probiotic Product (PROBIOTIC DAILY PO), tiZANidine, ALPRAZolam, and carvedilol.  No orders of the defined types were placed in this encounter.    Penni Homans, MD

## 2016-04-17 ENCOUNTER — Other Ambulatory Visit: Payer: Self-pay | Admitting: Family Medicine

## 2016-05-19 DIAGNOSIS — Z1231 Encounter for screening mammogram for malignant neoplasm of breast: Secondary | ICD-10-CM | POA: Diagnosis not present

## 2016-05-19 DIAGNOSIS — N8111 Cystocele, midline: Secondary | ICD-10-CM | POA: Diagnosis not present

## 2016-05-19 LAB — HM MAMMOGRAPHY

## 2016-05-28 ENCOUNTER — Encounter: Payer: Self-pay | Admitting: Family Medicine

## 2016-10-01 ENCOUNTER — Ambulatory Visit: Payer: Commercial Managed Care - HMO | Admitting: Family Medicine

## 2016-10-06 ENCOUNTER — Ambulatory Visit: Payer: Commercial Managed Care - HMO | Admitting: Family Medicine

## 2016-10-15 ENCOUNTER — Encounter: Payer: Self-pay | Admitting: Family Medicine

## 2016-10-15 ENCOUNTER — Ambulatory Visit (INDEPENDENT_AMBULATORY_CARE_PROVIDER_SITE_OTHER): Payer: Medicare HMO | Admitting: Family Medicine

## 2016-10-15 VITALS — BP 128/70 | HR 76 | Temp 98.6°F | Wt 138.2 lb

## 2016-10-15 DIAGNOSIS — B070 Plantar wart: Secondary | ICD-10-CM

## 2016-10-15 DIAGNOSIS — E782 Mixed hyperlipidemia: Secondary | ICD-10-CM

## 2016-10-15 DIAGNOSIS — E559 Vitamin D deficiency, unspecified: Secondary | ICD-10-CM

## 2016-10-15 DIAGNOSIS — D049 Carcinoma in situ of skin, unspecified: Secondary | ICD-10-CM | POA: Diagnosis not present

## 2016-10-15 DIAGNOSIS — Z23 Encounter for immunization: Secondary | ICD-10-CM | POA: Diagnosis not present

## 2016-10-15 DIAGNOSIS — I10 Essential (primary) hypertension: Secondary | ICD-10-CM

## 2016-10-15 HISTORY — DX: Plantar wart: B07.0

## 2016-10-15 LAB — COMPREHENSIVE METABOLIC PANEL
ALT: 15 U/L (ref 0–35)
AST: 20 U/L (ref 0–37)
Albumin: 4.4 g/dL (ref 3.5–5.2)
Alkaline Phosphatase: 70 U/L (ref 39–117)
BUN: 12 mg/dL (ref 6–23)
CHLORIDE: 107 meq/L (ref 96–112)
CO2: 23 meq/L (ref 19–32)
Calcium: 9.6 mg/dL (ref 8.4–10.5)
Creatinine, Ser: 0.79 mg/dL (ref 0.40–1.20)
GFR: 75.53 mL/min (ref >60.00)
Glucose, Bld: 111 mg/dL — ABNORMAL HIGH (ref 70–99)
Potassium: 3.6 mEq/L (ref 3.5–5.1)
Sodium: 141 mEq/L (ref 135–145)
Total Bilirubin: 0.4 mg/dL (ref 0.2–1.2)
Total Protein: 7 g/dL (ref 6.0–8.3)

## 2016-10-15 LAB — CBC
HEMATOCRIT: 40.6 % (ref 36.0–46.0)
Hemoglobin: 13.2 g/dL (ref 12.0–15.0)
MCHC: 32.6 g/dL (ref 30.0–36.0)
MCV: 95.1 fl (ref 78.0–100.0)
Platelets: 177 10*3/uL (ref 150.0–400.0)
RBC: 4.27 Mil/uL (ref 3.87–5.11)
RDW: 14 % (ref 11.5–15.5)
WBC: 8.6 10*3/uL (ref 4.0–10.5)

## 2016-10-15 LAB — VITAMIN D 25 HYDROXY (VIT D DEFICIENCY, FRACTURES): VITD: 40.66 ng/mL (ref 30.00–100.00)

## 2016-10-15 LAB — LIPID PANEL
CHOL/HDL RATIO: 5
Cholesterol: 254 mg/dL — ABNORMAL HIGH (ref 0–200)
HDL: 55 mg/dL (ref >39.00)
NonHDL: 198.73
TRIGLYCERIDES: 346 mg/dL — AB (ref 0.0–149.0)
VLDL: 69.2 mg/dL — AB (ref 0.0–40.0)

## 2016-10-15 LAB — TSH: TSH: 1 u[IU]/mL (ref 0.35–4.50)

## 2016-10-15 LAB — LDL CHOLESTEROL, DIRECT: Direct LDL: 143 mg/dL

## 2016-10-15 MED ORDER — TETANUS-DIPHTHERIA TOXOIDS TD 5-2 LFU IM INJ: 0.5000 mL | INJECTION | Freq: Once | INTRAMUSCULAR | 0 refills | Status: AC

## 2016-10-15 MED ORDER — ZOSTER VAC RECOMB ADJUVANTED 50 MCG/0.5ML IM SUSR: 0.5000 mL | Freq: Once | INTRAMUSCULAR | 1 refills | Status: AC

## 2016-10-15 MED ORDER — CARVEDILOL 6.25 MG PO TABS: ORAL_TABLET | ORAL | 5 refills | Status: DC

## 2016-10-15 NOTE — Progress Notes (Signed)
Pre visit review using our clinic review tool, if applicable. No additional management support is needed unless otherwise documented below in the visit note. 

## 2016-10-15 NOTE — Patient Instructions (Signed)
Hypertension °Hypertension is another name for high blood pressure. High blood pressure forces your heart to work harder to pump blood. This can cause problems over time. °There are two numbers in a blood pressure reading. There is a top number (systolic) over a bottom number (diastolic). It is best to have a blood pressure below 120/80. Healthy choices can help lower your blood pressure. You may need medicine to help lower your blood pressure if: °· Your blood pressure cannot be lowered with healthy choices. °· Your blood pressure is higher than 130/80. °Follow these instructions at home: °Eating and drinking  °· If directed, follow the DASH eating plan. This diet includes: °¨ Filling half of your plate at each meal with fruits and vegetables. °¨ Filling one quarter of your plate at each meal with whole grains. Whole grains include whole wheat pasta, brown rice, and whole grain bread. °¨ Eating or drinking low-fat dairy products, such as skim milk or low-fat yogurt. °¨ Filling one quarter of your plate at each meal with low-fat (lean) proteins. Low-fat proteins include fish, skinless chicken, eggs, beans, and tofu. °¨ Avoiding fatty meat, cured and processed meat, or chicken with skin. °¨ Avoiding premade or processed food. °· Eat less than 1,500 mg of salt (sodium) a day. °· Limit alcohol use to no more than 1 drink a day for nonpregnant women and 2 drinks a day for men. One drink equals 12 oz of beer, 5 oz of wine, or 1½ oz of hard liquor. °Lifestyle  °· Work with your doctor to stay at a healthy weight or to lose weight. Ask your doctor what the best weight is for you. °· Get at least 30 minutes of exercise that causes your heart to beat faster (aerobic exercise) most days of the week. This may include walking, swimming, or biking. °· Get at least 30 minutes of exercise that strengthens your muscles (resistance exercise) at least 3 days a week. This may include lifting weights or pilates. °· Do not use any  products that contain nicotine or tobacco. This includes cigarettes and e-cigarettes. If you need help quitting, ask your doctor. °· Check your blood pressure at home as told by your doctor. °· Keep all follow-up visits as told by your doctor. This is important. °Medicines  °· Take over-the-counter and prescription medicines only as told by your doctor. Follow directions carefully. °· Do not skip doses of blood pressure medicine. The medicine does not work as well if you skip doses. Skipping doses also puts you at risk for problems. °· Ask your doctor about side effects or reactions to medicines that you should watch for. °Contact a doctor if: °· You think you are having a reaction to the medicine you are taking. °· You have headaches that keep coming back (recurring). °· You feel dizzy. °· You have swelling in your ankles. °· You have trouble with your vision. °Get help right away if: °· You get a very bad headache. °· You start to feel confused. °· You feel weak or numb. °· You feel faint. °· You get very bad pain in your: °¨ Chest. °¨ Belly (abdomen). °· You throw up (vomit) more than once. °· You have trouble breathing. °Summary °· Hypertension is another name for high blood pressure. °· Making healthy choices can help lower blood pressure. If your blood pressure cannot be controlled with healthy choices, you may need to take medicine. °This information is not intended to replace advice given to you by your   health care provider. Make sure you discuss any questions you have with your health care provider. °Document Released: 12/02/2007 Document Revised: 05/13/2016 Document Reviewed: 05/13/2016 °Elsevier Interactive Patient Education © 2017 [employer] ° °

## 2016-10-15 NOTE — Assessment & Plan Note (Signed)
Base of 4th toe. Referred to podiatry for care.

## 2016-10-15 NOTE — Progress Notes (Signed)
Patient ID: Sophia [name], female   DOB: 07-Dec-1940, 77 y.o.   MRN: [id number]   Subjective:  I acted as a Education administrator for Penni Homans, [place], [place]   Patient ID: Sophia [name], female    DOB: May 16, 1941, 76 y.o.   MRN: [id number]  Chief Complaint  Patient presents with  . Follow-up    Hyperlipidemia  Pertinent negatives include no chest pain or shortness of breath.  Hypertension  Associated symptoms include malaise/fatigue. Pertinent negatives include no blurred vision, chest pain, headaches, palpitations or shortness of breath.    Patient is in today for a 70-month follow up visit from her annual annual examination. Patient has a Hx of hyperlipidemia, HTN, GERD. Patient has no acute concerns noted at this time. She has some new skin changes and has not seen her dermatologist lately. Denies CP/palp/SOB/HA/congestion/fevers/GI or GU c/o. Taking meds as prescribed  Patient Care Team: Mosie Lukes, MD as PCP - General (Family Medicine) Azucena Fallen, MD as Consulting Physician (Obstetrics and Gynecology) Rolm Bookbinder, MD as Consulting Physician (Dermatology) Katy Apo, MD as Consulting Physician (Ophthalmology)   Past Medical History:  Diagnosis Date  . Anxiety and depression   . Arthritis    hands, knees, etc  . Bladder prolapse, female, acquired 09/16/2014  . Bowen's disease   . Diverticulitis   . GERD (gastroesophageal reflux disease)   . H/O hematuria    for several years, work only revealed kidney stone on right side  . History of chicken pox   . History of kidney stones   . Hyperlipidemia   . Hypertension   . Leg cramps, sleep related 04/02/2016  . Osteopenia 06/26/2014  . Osteoporosis 06/26/2014  . Plantar wart of [name] foot 10/15/2016  . SCC (squamous cell carcinoma)    arms    Past Surgical History:  Procedure Laterality Date  . CHOLECYSTECTOMY  2005  . SKIN BIOPSY     multiple    Family History  Problem Relation Age of Onset  . Heart disease  Mother     aortic stenosis  . Hyperlipidemia Mother   . Hypertension Mother   . Heart attack Mother   . Cancer Mother     BCC, SCC, skin  . Hypertension Father   . Cancer Father     SCC, BCC, skin  . Early death Daughter   . Heart disease Maternal Grandfather     MI  . Tuberculosis Paternal Grandmother   . Cancer Paternal Grandfather     head and neck cancer, chewed tobacco  . Hypertension Daughter   . Hypertension Brother   . Hyperlipidemia Brother   . Birth defects Maternal Uncle     Social History   Social History  . Marital status: Single    Spouse name: N/A  . Number of children: N/A  . Years of education: N/A   Occupational History  . Not on file.   Social History Main Topics  . Smoking status: Never Smoker  . Smokeless tobacco: Never Used  . Alcohol use No  . Drug use: No  . Sexual activity: No     Comment: divorced, widowed, lives with elderly parents, is the caregiver, no dietary restrictions.    Other Topics Concern  . Not on file   Social History Narrative  . No narrative on file    Outpatient Medications Prior to Visit  Medication Sig Dispense Refill  . ALPRAZolam (XANAX) 0.25 MG tablet Take 1 tablet (0.25 mg total) by mouth  daily as needed for anxiety or sleep. 30 tablet 0  . aspirin 81 MG tablet Take 81 mg by mouth 3 (three) times a week.    . B Complex-C (SUPER B COMPLEX) TABS Take 1 tablet by mouth daily.    . Coenzyme Q10 (COQ-10) 200 MG CAPS Take 1 capsule by mouth daily.    . Omega-3 Fatty Acids (FISH OIL) 1200 MG CAPS Take 1 capsule by mouth 2 (two) times daily.    [name] OVER THE COUNTER MEDICATION Citrical with Vit D3 630mg /500IU-Take 1 capsule by mouth daily.    . Probiotic Product (PROBIOTIC DAILY PO) Take by mouth daily.    . Red Yeast Rice 600 MG CAPS Take 1 capsule by mouth daily.    . carvedilol (COREG) 6.25 MG tablet TAKE 1 TABLET(6.25 MG) BY MOUTH TWICE DAILY WITH A MEAL 60 tablet 5  . tiZANidine (ZANAFLEX) 2 MG tablet TAKE 1  TABLET(2 MG) BY MOUTH AT BEDTIME AS NEEDED FOR MUSCLE SPASMS (Patient not taking: Reported on 10/15/2016) 90 tablet 0   No facility-administered medications prior to visit.     Allergies  Allergen Reactions  . Morphine And Related Swelling  . Statins Other (See Comments)    Myalgias, weakness  . Sulfa Antibiotics Swelling and Rash    Review of Systems  Constitutional: Positive for malaise/fatigue. Negative for fever.  HENT: Negative for congestion.   Eyes: Negative for blurred vision.  Respiratory: Negative for cough and shortness of breath.   Cardiovascular: Negative for chest pain, palpitations and leg swelling.  Gastrointestinal: Negative for vomiting.  Musculoskeletal: Negative for back pain.  Skin: Positive for rash.  Neurological: Negative for loss of consciousness and headaches.  Psychiatric/Behavioral: Negative for hallucinations, substance abuse and suicidal ideas. The patient is not nervous/anxious and does not have insomnia.        Objective:    Physical Exam  Constitutional: She is oriented to person, place, and time. She appears well-developed and well-nourished. No distress.  HENT:  Head: Normocephalic and atraumatic.  Eyes: Conjunctivae are normal.  Neck: Normal range of motion. No thyromegaly present.  Cardiovascular: Normal rate and regular rhythm.   Murmur heard. Pulmonary/Chest: Effort normal and breath sounds normal. She has no wheezes.  Abdominal: Soft. Bowel sounds are normal. There is no tenderness.  Musculoskeletal: She exhibits no edema or deformity.  Neurological: She is alert and oriented to person, place, and time.  Skin: Skin is warm and dry. She is not diaphoretic.  Psychiatric: She has a normal mood and affect.    BP 128/70 (BP Location: [name] Arm, Patient Position: Sitting, Cuff Size: Normal)   Pulse 76   Temp 98.6 F (37 C) (Oral)   Wt 138 lb 3.2 oz (62.7 kg)   SpO2 100% Comment: RA  BMI 26.11 kg/m  Wt Readings from Last 3  Encounters:  10/15/16 138 lb 3.2 oz (62.7 kg)  04/02/16 140 lb 9.6 oz (63.8 kg)  09/24/15 141 lb 8 oz (64.2 kg)   BP Readings from Last 3 Encounters:  10/15/16 128/70  04/02/16 126/80  09/24/15 120/80     Immunization History  Administered Date(s) Administered  . Influenza,inj,Quad PF,36+ Mos 03/19/2015  . Influenza-Unspecified 04/29/2014    Health Maintenance  Topic Date Due  . TETANUS/TDAP  04/16/2017 (Originally 05/25/1961)  . PNA vac Low Risk Adult (1 of 2 - PCV13) 04/16/2017 (Originally 05/26/2007)  . INFLUENZA VACCINE  01/27/2017  . COLONOSCOPY  10/28/2018  . DEXA SCAN  Completed  Lab Results  Component Value Date   WBC 8.4 04/02/2016   HGB 12.9 04/02/2016   HCT 38.3 04/02/2016   PLT 181.0 04/02/2016   GLUCOSE 92 04/02/2016   CHOL 267 (H) 04/02/2016   TRIG 206.0 (H) 04/02/2016   HDL 60.10 04/02/2016   LDLDIRECT 173.0 04/02/2016   LDLCALC 130 (H) 03/12/2015   ALT 15 04/02/2016   AST 21 04/02/2016   NA 140 04/02/2016   K 4.2 04/02/2016   CL 104 04/02/2016   CREATININE 0.78 04/02/2016   BUN 12 04/02/2016   CO2 28 04/02/2016   TSH 1.12 04/02/2016    Lab Results  Component Value Date   TSH 1.12 04/02/2016   Lab Results  Component Value Date   WBC 8.4 04/02/2016   HGB 12.9 04/02/2016   HCT 38.3 04/02/2016   MCV 91.6 04/02/2016   PLT 181.0 04/02/2016   Lab Results  Component Value Date   NA 140 04/02/2016   K 4.2 04/02/2016   CO2 28 04/02/2016   GLUCOSE 92 04/02/2016   BUN 12 04/02/2016   CREATININE 0.78 04/02/2016   BILITOT 0.6 04/02/2016   ALKPHOS 61 04/02/2016   AST 21 04/02/2016   ALT 15 04/02/2016   PROT 7.0 04/02/2016   ALBUMIN 4.3 04/02/2016   CALCIUM 9.4 04/02/2016   GFR 76.76 04/02/2016   Lab Results  Component Value Date   CHOL 267 (H) 04/02/2016   Lab Results  Component Value Date   HDL 60.10 04/02/2016   Lab Results  Component Value Date   LDLCALC 130 (H) 03/12/2015   Lab Results  Component Value Date   TRIG 206.0  (H) 04/02/2016   Lab Results  Component Value Date   CHOLHDL 4 04/02/2016   No results found for: HGBA1C       Assessment & Plan:   Problem List Items Addressed This Visit    Hypertension    Well controlled, no changes to meds. Encouraged heart healthy diet such as the DASH diet and exercise as tolerated.       Relevant Medications   carvedilol (COREG) 6.25 MG tablet   Other Relevant Orders   CBC   Comprehensive metabolic panel   Hyperlipidemia    Encouraged heart healthy diet, increase exercise, avoid trans fats, consider a krill oil cap daily      Relevant Medications   carvedilol (COREG) 6.25 MG tablet   Other Relevant Orders   Lipid panel   TSH   Bowen's disease    Patient with new lesions on [name] anterior chest wall and right arm, has not seen her dermatologist, Dr Ubaldo Glassing in 2-3 years.      Relevant Orders   Ambulatory referral to Dermatology   Plantar wart of [name] foot    Base of 4th toe. Referred to podiatry for care.      Relevant Medications   Zoster Vac Recomb Adjuvanted [facility]) injection   Other Relevant Orders   Ambulatory referral to Podiatry    Other Visit Diagnoses    Need for diphtheria-tetanus-pertussis (Tdap) vaccine    -  Primary   Vitamin D deficiency       Relevant Orders   VITAMIN D 25 Hydroxy (Vit-D Deficiency, Fractures)   Need for zoster vaccine       Relevant Medications   Zoster Vac Recomb Adjuvanted [facility]) injection   Need for vaccine for Td (tetanus-diphtheria)       Relevant Medications   tetanus & diphtheria toxoids, adult, (TENIVAC) 5-2 LFU  injection      I have discontinued Ms. Bouwens's tiZANidine. I am also having her start on Zoster Vac Recomb Adjuvanted and tetanus & diphtheria toxoids (adult). Additionally, I am having her maintain her aspirin, Fish Oil, OVER THE COUNTER MEDICATION, CoQ-10, Red Yeast Rice, SUPER B COMPLEX, Probiotic Product (PROBIOTIC DAILY PO), ALPRAZolam, and carvedilol.  Meds ordered this  encounter  Medications  . carvedilol (COREG) 6.25 MG tablet    Sig: TAKE 1 TABLET(6.25 MG) BY MOUTH TWICE DAILY WITH A MEAL    Dispense:  60 tablet    Refill:  5  . Zoster Vac Recomb Adjuvanted [facility]) injection    Sig: Inject 0.5 mLs into the muscle once.    Dispense:  0.5 mL    Refill:  1  . tetanus & diphtheria toxoids, adult, (TENIVAC) 5-2 LFU injection    Sig: Inject 0.5 mLs into the muscle once.    Dispense:  0.5 mL    Refill:  0    CMA served as scribe during this visit. History, Physical and Plan performed by medical provider. Documentation and orders reviewed and attested to.  Penni Homans, MD

## 2016-10-15 NOTE — Assessment & Plan Note (Signed)
Well controlled, no changes to meds. Encouraged heart healthy diet such as the DASH diet and exercise as tolerated.  °

## 2016-10-15 NOTE — Assessment & Plan Note (Signed)
Patient with new lesions on left anterior chest wall and right arm, has not seen her dermatologist, Dr Ubaldo Glassing in 2-3 years.

## 2016-10-15 NOTE — Assessment & Plan Note (Signed)
Encouraged heart healthy diet, increase exercise, avoid trans fats, consider a krill oil cap daily 

## 2016-10-15 NOTE — Assessment & Plan Note (Addendum)
Encouraged to get adequate exercise, calcium and vitamin d intake  Declines medications at this time

## 2016-10-28 DIAGNOSIS — N8111 Cystocele, midline: Secondary | ICD-10-CM | POA: Diagnosis not present

## 2016-12-14 NOTE — Progress Notes (Signed)
Subjective:   Sophia [name] is a 76 y.o. female who presents for Medicare Annual (Subsequent) preventive examination.  Review of Systems:  No ROS.  Medicare Wellness Visit. Additional risk factors are reflected in the social history. Cardiac Risk Factors include: advanced age (>23men, >56 women);dyslipidemia;hypertension Sleep patterns: Goes to bed at 10. Wakes at 7. Often has restless sleep though. Wakes 2-4 times to urinate. Feels rested usually. No naps.  Home Safety/Smoke Alarms: Feels safe in home. Smoke alarms in place.  Living environment; residence and Adult nurse: lives with daughter and her family. Does okay with stairs. Guns not discussed. Seat Belt Safety/Bike Helmet: Wears seat belt.   Counseling:   Eye Exam- Wears glasses. Yearly. Dental- yearly.   Female:   Pap- Follows with Dr.Mody.      Mammo- last 05/19/16:  BI-RADS Category 2: Benign   Dexa scan-  Last 09/24/15: osteoporosis CCS- Last 10/27/08: normal (ABSTRACTED)    Objective:     Vitals: BP 130/68 (BP Location: Left Arm, Patient Position: Sitting, Cuff Size: Normal)   Pulse 70   Ht 5\' 1"  (1.549 m)   Wt 140 lb (63.5 kg)   SpO2 99%   BMI 26.45 kg/m   Body mass index is 26.45 kg/m.   Tobacco History  Smoking Status  . Never Smoker  Smokeless Tobacco  . Never Used     Counseling given: No   Past Medical History:  Diagnosis Date  . Anxiety and depression   . Arthritis    hands, knees, etc  . Bladder prolapse, female, acquired 09/16/2014  . Bowen's disease   . Diverticulitis   . GERD (gastroesophageal reflux disease)   . H/O hematuria    for several years, work only revealed kidney stone on right side  . History of chicken pox   . History of kidney stones   . Hyperlipidemia   . Hypertension   . Leg cramps, sleep related 04/02/2016  . Osteopenia 06/26/2014  . Osteoporosis 06/26/2014  . Plantar wart of left foot 10/15/2016  . SCC (squamous cell carcinoma)    arms   Past Surgical  History:  Procedure Laterality Date  . CHOLECYSTECTOMY  2005  . SKIN BIOPSY     multiple   Family History  Problem Relation Age of Onset  . Heart disease Mother        aortic stenosis  . Hyperlipidemia Mother   . Hypertension Mother   . Heart attack Mother   . Cancer Mother        BCC, SCC, skin  . Hypertension Father   . Cancer Father        SCC, BCC, skin  . Early death Daughter   . Heart disease Maternal Grandfather        MI  . Tuberculosis Paternal Grandmother   . Cancer Paternal Grandfather        head and neck cancer, chewed tobacco  . Hypertension Daughter   . Hypertension Brother   . Hyperlipidemia Brother   . Birth defects Maternal Uncle    History  Sexual Activity  . Sexual activity: No    Comment: divorced, widowed, lives with elderly parents, is the caregiver, no dietary restrictions.     Outpatient Encounter Prescriptions as of 12/15/2016  Medication Sig  . aspirin 81 MG tablet Take 81 mg by mouth 3 (three) times a week.  . B Complex-C (SUPER B COMPLEX) TABS Take 1 tablet by mouth daily.  . carvedilol (COREG) 6.25 MG tablet  TAKE 1 TABLET(6.25 MG) BY MOUTH TWICE DAILY WITH A MEAL  . Coenzyme Q10 (COQ-10) 200 MG CAPS Take 1 capsule by mouth daily.  . Omega-3 Fatty Acids (FISH OIL) 1200 MG CAPS Take 1 capsule by mouth 2 (two) times daily.  [name] OVER THE COUNTER MEDICATION Citrical with Vit D3 630mg /500IU-Take 1 capsule by mouth daily.  . Probiotic Product (PROBIOTIC DAILY PO) Take by mouth daily.  . Red Yeast Rice 600 MG CAPS Take 1 capsule by mouth daily.  [name] ALPRAZolam (XANAX) 0.25 MG tablet Take 1 tablet (0.25 mg total) by mouth daily as needed for anxiety or sleep. (Patient not taking: Reported on 12/15/2016)   No facility-administered encounter medications on file as of 12/15/2016.     Activities of Daily Living In your present state of health, do you have any difficulty performing the following activities: 12/15/2016  Hearing? N  Vision? N  Difficulty  concentrating or making decisions? N  Walking or climbing stairs? N  Dressing or bathing? N  Doing errands, shopping? N  Preparing Food and eating ? N  Using the Toilet? N  In the past six months, have you accidently leaked urine? N  Do you have problems with loss of bowel control? N  Managing your Medications? N  Managing your Finances? N  Housekeeping or managing your Housekeeping? N  Some recent data might be hidden    Patient Care Team: Mosie Lukes, MD as PCP - General (Family Medicine) Azucena Fallen, MD as Consulting Physician (Obstetrics and Gynecology) Rolm Bookbinder, MD as Consulting Physician (Dermatology) Katy Apo, MD as Consulting Physician (Ophthalmology)    Assessment:    Physical assessment deferred to PCP.  Exercise Activities and Dietary recommendations Current Exercise Habits: Home exercise routine, Type of exercise: walking, Time (Minutes): 20, Frequency (Times/Week): 4, Weekly Exercise (Minutes/Week): 80, Intensity: Mild   Diet (meal preparation, eat out, water intake, caffeinated beverages, dairy products, fruits and vegetables): in general, a "healthy" diet       Goals      Patient Stated   . Schedule eye and dental exams.   (pt-stated)      Fall Risk Fall Risk  12/15/2016 04/02/2016 06/13/2015 06/26/2014  Falls in the past year? No No No No   Depression Screen PHQ 2/9 Scores 12/15/2016 04/02/2016 06/13/2015 06/26/2014  PHQ - 2 Score 0 0 1 0     Cognitive Function MMSE - Mini Mental State Exam 06/13/2015  Orientation to time 5  Orientation to Place 5  Registration 3  Attention/ Calculation 5  Recall 3  Language- name 2 objects 2  Language- repeat 1  Language- follow 3 step command 3  Language- read & follow direction 1  Write a sentence 1  Copy design 1  Total score 30        Immunization History  Administered Date(s) Administered  . Influenza,inj,Quad PF,36+ Mos 03/19/2015  . Influenza-Unspecified 04/29/2014   Screening  Tests Health Maintenance  Topic Date Due  . TETANUS/TDAP  04/16/2017 (Originally 05/25/1961)  . PNA vac Low Risk Adult (1 of 2 - PCV13) 04/16/2017 (Originally 05/26/2007)  . INFLUENZA VACCINE  01/27/2017  . COLONOSCOPY  10/28/2018  . DEXA SCAN  Completed      Plan:   Return 04/23/17  For appointment with Dr.Blyth.  Continue to eat heart healthy diet (full of fruits, vegetables, whole grains, lean protein, water--limit salt, fat, and sugar intake) and increase physical activity as tolerated.  Continue doing brain stimulating activities (puzzles, reading, adult  coloring books, staying active) to keep memory sharp.   Bring a copy of your advance directives to your next office visit.   I have personally reviewed and noted the following in the patient's chart:   . Medical and social history . Use of alcohol, tobacco or illicit drugs  . Current medications and supplements . Functional ability and status . Nutritional status . Physical activity . Advanced directives . List of other physicians . Hospitalizations, surgeries, and ER visits in previous 12 months . Vitals . Screenings to include cognitive, depression, and falls . Referrals and appointments  In addition, I have reviewed and discussed with patient certain preventive protocols, quality metrics, and best practice recommendations. A written personalized care plan for preventive services as well as general preventive health recommendations were provided to patient.     Shela Nevin, [place]  12/15/2016

## 2016-12-15 ENCOUNTER — Encounter: Payer: Self-pay | Admitting: *Deleted

## 2016-12-15 ENCOUNTER — Ambulatory Visit (INDEPENDENT_AMBULATORY_CARE_PROVIDER_SITE_OTHER): Payer: Medicare HMO | Admitting: *Deleted

## 2016-12-15 VITALS — BP 130/68 | HR 70 | Ht 61.0 in | Wt 140.0 lb

## 2016-12-15 DIAGNOSIS — Z0000 Encounter for general adult medical examination without abnormal findings: Secondary | ICD-10-CM | POA: Diagnosis not present

## 2016-12-15 NOTE — Patient Instructions (Signed)
Sophia [name] , Thank you for taking time to come for your Medicare Wellness Visit. I appreciate your ongoing commitment to your health goals. Please review the following plan we discussed and let me know if I can assist you in the future.   These are the goals we discussed: Goals      Patient Stated   . Lose 5lbs--current wt is 140lb (pt-stated)    . Schedule dermatologist and foot doctor. (pt-stated)    . Schedule eye and dental exams.   (pt-stated)       This is a list of the screening recommended for you and due dates:  Health Maintenance  Topic Date Due  . Tetanus Vaccine  04/16/2017*  . Pneumonia vaccines (1 of 2 - PCV13) 04/16/2017*  . Flu Shot  01/27/2017  . Colon Cancer Screening  10/28/2018  . DEXA scan (bone density measurement)  Completed  *Topic was postponed. The date shown is not the original due date.   Return 04/23/17  For appointment with Dr.Blyth.  Continue to eat heart healthy diet (full of fruits, vegetables, whole grains, lean protein, water--limit salt, fat, and sugar intake) and increase physical activity as tolerated.  Continue doing brain stimulating activities (puzzles, reading, adult coloring books, staying active) to keep memory sharp.   Bring a copy of your advance directives to your next office visit.   Health Maintenance for Postmenopausal Women Menopause is a normal process in which your reproductive ability comes to an end. This process happens gradually over a span of months to years, usually between the ages of 69 and 43. Menopause is complete when you have missed 12 consecutive menstrual periods. It is important to talk with your health care provider about some of the most common conditions that affect postmenopausal women, such as heart disease, cancer, and bone loss (osteoporosis). Adopting a healthy lifestyle and getting preventive care can help to promote your health and wellness. Those actions can also lower your chances of developing some of  these common conditions. What should I know about menopause? During menopause, you may experience a number of symptoms, such as:  Moderate-to-severe hot flashes.  Night sweats.  Decrease in sex drive.  Mood swings.  Headaches.  Tiredness.  Irritability.  Memory problems.  Insomnia.  Choosing to treat or not to treat menopausal changes is an individual decision that you make with your health care provider. What should I know about hormone replacement therapy and supplements? Hormone therapy products are effective for treating symptoms that are associated with menopause, such as hot flashes and night sweats. Hormone replacement carries certain risks, especially as you become older. If you are thinking about using estrogen or estrogen with progestin treatments, discuss the benefits and risks with your health care provider. What should I know about heart disease and stroke? Heart disease, heart attack, and stroke become more likely as you age. This may be due, in part, to the hormonal changes that your body experiences during menopause. These can affect how your body processes dietary fats, triglycerides, and cholesterol. Heart attack and stroke are both medical emergencies. There are many things that you can do to help prevent heart disease and stroke:  Have your blood pressure checked at least every 1-2 years. High blood pressure causes heart disease and increases the risk of stroke.  If you are 22-12 years old, ask your health care provider if you should take aspirin to prevent a heart attack or a stroke.  Do not use any tobacco products,  including cigarettes, chewing tobacco, or electronic cigarettes. If you need help quitting, ask your health care provider.  It is important to eat a healthy diet and maintain a healthy weight. ? Be sure to include plenty of vegetables, fruits, low-fat dairy products, and lean protein. ? Avoid eating foods that are high in solid fats, added  sugars, or salt (sodium).  Get regular exercise. This is one of the most important things that you can do for your health. ? Try to exercise for at least 150 minutes each week. The type of exercise that you do should increase your heart rate and make you sweat. This is known as moderate-intensity exercise. ? Try to do strengthening exercises at least twice each week. Do these in addition to the moderate-intensity exercise.  Know your numbers.Ask your health care provider to check your cholesterol and your blood glucose. Continue to have your blood tested as directed by your health care provider.  What should I know about cancer screening? There are several types of cancer. Take the following steps to reduce your risk and to catch any cancer development as early as possible. Breast Cancer  Practice breast self-awareness. ? This means understanding how your breasts normally appear and feel. ? It also means doing regular breast self-exams. Let your health care provider know about any changes, no matter how small.  If you are 30 or older, have a clinician do a breast exam (clinical breast exam or CBE) every year. Depending on your age, family history, and medical history, it may be recommended that you also have a yearly breast X-ray (mammogram).  If you have a family history of breast cancer, talk with your health care provider about genetic screening.  If you are at high risk for breast cancer, talk with your health care provider about having an MRI and a mammogram every year.  Breast cancer (BRCA) gene test is recommended for women who have family members with BRCA-related cancers. Results of the assessment will determine the need for genetic counseling and BRCA1 and for BRCA2 testing. BRCA-related cancers include these types: ? Breast. This occurs in males or females. ? Ovarian. ? Tubal. This may also be called fallopian tube cancer. ? Cancer of the abdominal or pelvic lining (peritoneal  cancer). ? Prostate. ? Pancreatic.  Cervical, Uterine, and Ovarian Cancer Your health care provider may recommend that you be screened regularly for cancer of the pelvic organs. These include your ovaries, uterus, and vagina. This screening involves a pelvic exam, which includes checking for microscopic changes to the surface of your cervix (Pap test).  For women ages 21-65, health care providers may recommend a pelvic exam and a Pap test every three years. For women ages 24-65, they may recommend the Pap test and pelvic exam, combined with testing for human papilloma virus (HPV), every five years. Some types of HPV increase your risk of cervical cancer. Testing for HPV may also be done on women of any age who have unclear Pap test results.  Other health care providers may not recommend any screening for nonpregnant women who are considered low risk for pelvic cancer and have no symptoms. Ask your health care provider if a screening pelvic exam is right for you.  If you have had past treatment for cervical cancer or a condition that could lead to cancer, you need Pap tests and screening for cancer for at least 20 years after your treatment. If Pap tests have been discontinued for you, your risk factors (such  as having a new sexual partner) need to be reassessed to determine if you should start having screenings again. Some women have medical problems that increase the chance of getting cervical cancer. In these cases, your health care provider may recommend that you have screening and Pap tests more often.  If you have a family history of uterine cancer or ovarian cancer, talk with your health care provider about genetic screening.  If you have vaginal bleeding after reaching menopause, tell your health care provider.  There are currently no reliable tests available to screen for ovarian cancer.  Lung Cancer Lung cancer screening is recommended for adults 76-68 years old who are at high risk for  lung cancer because of a history of smoking. A yearly low-dose CT scan of the lungs is recommended if you:  Currently smoke.  Have a history of at least 30 pack-years of smoking and you currently smoke or have quit within the past 15 years. A pack-year is smoking an average of one pack of cigarettes per day for one year.  Yearly screening should:  Continue until it has been 15 years since you quit.  Stop if you develop a health problem that would prevent you from having lung cancer treatment.  Colorectal Cancer  This type of cancer can be detected and can often be prevented.  Routine colorectal cancer screening usually begins at age 56 and continues through age 8.  If you have risk factors for colon cancer, your health care provider may recommend that you be screened at an earlier age.  If you have a family history of colorectal cancer, talk with your health care provider about genetic screening.  Your health care provider may also recommend using home test kits to check for hidden blood in your stool.  A small camera at the end of a tube can be used to examine your colon directly (sigmoidoscopy or colonoscopy). This is done to check for the earliest forms of colorectal cancer.  Direct examination of the colon should be repeated every 5-10 years until age 34. However, if early forms of precancerous polyps or small growths are found or if you have a family history or genetic risk for colorectal cancer, you may need to be screened more often.  Skin Cancer  Check your skin from head to toe regularly.  Monitor any moles. Be sure to tell your health care provider: ? About any new moles or changes in moles, especially if there is a change in a mole's shape or color. ? If you have a mole that is larger than the size of a pencil eraser.  If any of your family members has a history of skin cancer, especially at a young age, talk with your health care provider about genetic  screening.  Always use sunscreen. Apply sunscreen liberally and repeatedly throughout the day.  Whenever you are outside, protect yourself by wearing long sleeves, pants, a wide-brimmed hat, and sunglasses.  What should I know about osteoporosis? Osteoporosis is a condition in which bone destruction happens more quickly than new bone creation. After menopause, you may be at an increased risk for osteoporosis. To help prevent osteoporosis or the bone fractures that can happen because of osteoporosis, the following is recommended:  If you are 75-33 years old, get at least 1,000 mg of calcium and at least 600 mg of vitamin D per day.  If you are older than age 39 but younger than age 46, get at least 1,200 mg of calcium  and at least 600 mg of vitamin D per day.  If you are older than age 88, get at least 1,200 mg of calcium and at least 800 mg of vitamin D per day.  Smoking and excessive alcohol intake increase the risk of osteoporosis. Eat foods that are rich in calcium and vitamin D, and do weight-bearing exercises several times each week as directed by your health care provider. What should I know about how menopause affects my mental health? Depression may occur at any age, but it is more common as you become older. Common symptoms of depression include:  Low or sad mood.  Changes in sleep patterns.  Changes in appetite or eating patterns.  Feeling an overall lack of motivation or enjoyment of activities that you previously enjoyed.  Frequent crying spells.  Talk with your health care provider if you think that you are experiencing depression. What should I know about immunizations? It is important that you get and maintain your immunizations. These include:  Tetanus, diphtheria, and pertussis (Tdap) booster vaccine.  Influenza every year before the flu season begins.  Pneumonia vaccine.  Shingles vaccine.  Your health care provider may also recommend other  immunizations. This information is not intended to replace advice given to you by your health care provider. Make sure you discuss any questions you have with your health care provider. Document Released: 08/07/2005 Document Revised: 01/03/2016 Document Reviewed: 03/19/2015 Elsevier Interactive Patient Education  2018 [employer].

## 2017-01-06 ENCOUNTER — Ambulatory Visit (HOSPITAL_BASED_OUTPATIENT_CLINIC_OR_DEPARTMENT_OTHER)
Admission: RE | Admit: 2017-01-06 | Discharge: 2017-01-06 | Disposition: A | Discharge status: Home / Self Care | Payer: Medicare HMO | Source: Ambulatory Visit | Attending: Family | Admitting: Family

## 2017-01-06 ENCOUNTER — Telehealth: Payer: Self-pay | Admitting: Family

## 2017-01-06 ENCOUNTER — Encounter: Payer: Self-pay | Admitting: Family

## 2017-01-06 ENCOUNTER — Ambulatory Visit (INDEPENDENT_AMBULATORY_CARE_PROVIDER_SITE_OTHER): Payer: Medicare HMO | Admitting: Family

## 2017-01-06 VITALS — BP 140/78 | HR 73 | Temp 98.3°F | Resp 16 | Ht 61.0 in | Wt 137.8 lb

## 2017-01-06 DIAGNOSIS — R1011 Right upper quadrant pain: Secondary | ICD-10-CM | POA: Diagnosis not present

## 2017-01-06 DIAGNOSIS — N281 Cyst of kidney, acquired: Secondary | ICD-10-CM | POA: Diagnosis not present

## 2017-01-06 DIAGNOSIS — D649 Anemia, unspecified: Secondary | ICD-10-CM

## 2017-01-06 DIAGNOSIS — K5730 Diverticulosis of large intestine without perforation or abscess without bleeding: Secondary | ICD-10-CM | POA: Diagnosis not present

## 2017-01-06 LAB — CBC WITH DIFFERENTIAL/PLATELET
BASOS PCT: 0.2 % (ref 0.0–3.0)
Basophils Absolute: 0 10*3/uL (ref 0.0–0.1)
EOS PCT: 0.2 % (ref 0.0–5.0)
Eosinophils Absolute: 0 10*3/uL (ref 0.0–0.7)
HEMATOCRIT: 35.3 % — AB (ref 36.0–46.0)
HEMOGLOBIN: 11.7 g/dL — AB (ref 12.0–15.0)
LYMPHS PCT: 21.6 % (ref 12.0–46.0)
Lymphs Abs: 2.8 10*3/uL (ref 0.7–4.0)
MCHC: 33.1 g/dL (ref 30.0–36.0)
MCV: 93.9 fl (ref 78.0–100.0)
Monocytes Absolute: 1 10*3/uL (ref 0.1–1.0)
Monocytes Relative: 7.3 % (ref 3.0–12.0)
Neutro Abs: 9.3 10*3/uL — ABNORMAL HIGH (ref 1.4–7.7)
Neutrophils Relative %: 70.7 % (ref 43.0–77.0)
Platelets: 173 10*3/uL (ref 150.0–400.0)
RBC: 3.76 Mil/uL — AB (ref 3.87–5.11)
RDW: 13.9 % (ref 11.5–15.5)
WBC: 13.1 10*3/uL — ABNORMAL HIGH (ref 4.0–10.5)

## 2017-01-06 LAB — COMPREHENSIVE METABOLIC PANEL
ALK PHOS: 87 U/L (ref 39–117)
ALT: 80 U/L — ABNORMAL HIGH (ref 0–35)
AST: 75 U/L — ABNORMAL HIGH (ref 0–37)
Albumin: 4 g/dL (ref 3.5–5.2)
BUN: 7 mg/dL (ref 6–23)
CALCIUM: 9.5 mg/dL (ref 8.4–10.5)
CHLORIDE: 102 meq/L (ref 96–112)
CO2: 26 mEq/L (ref 19–32)
Creatinine, Ser: 0.79 mg/dL (ref 0.40–1.20)
GFR: 75.49 mL/min (ref >60.00)
Glucose, Bld: 101 mg/dL — ABNORMAL HIGH (ref 70–99)
POTASSIUM: 4 meq/L (ref 3.5–5.1)
SODIUM: 136 meq/L (ref 135–145)
TOTAL PROTEIN: 7 g/dL (ref 6.0–8.3)
Total Bilirubin: 0.7 mg/dL (ref 0.2–1.2)

## 2017-01-06 LAB — LIPASE: Lipase: 19 U/L (ref 11.0–59.0)

## 2017-01-06 MED ORDER — CIPROFLOXACIN HCL 500 MG PO TABS
500.0000 mg | ORAL_TABLET | Freq: Two times a day (BID) | ORAL | 0 refills | Status: DC
Start: 2017-01-06 — End: 2017-10-12

## 2017-01-06 MED ORDER — IOPAMIDOL (ISOVUE-300) INJECTION 61%
100.0000 mL | Freq: Once | INTRAVENOUS | Status: AC | PRN
  Administered 2017-01-06: 100 mL via INTRAVENOUS

## 2017-01-06 MED ORDER — METRONIDAZOLE 500 MG PO TABS: 500.0000 mg | ORAL_TABLET | Freq: Three times a day (TID) | ORAL | 0 refills | Status: DC

## 2017-01-06 NOTE — Progress Notes (Signed)
Subjective:    Patient ID: Sophia [name], female    DOB: [birth year], 76 y.o.   MRN: [id number]  HPI   Sophia [name] is a 76 yr old female Who presents today with chief complaint of abdominal pain. She reports that abdominal pain began approximately 3 days ago. She has had associated chills and myalgias. She has previous history of diverticulitis. She is tolerating by mouth's without difficulty.  Review of Systems    see HPI  Past Medical History:  Diagnosis Date  . Anxiety and depression   . Arthritis    hands, knees, etc  . Bladder prolapse, female, acquired 09/16/2014  . Bowen's disease   . Diverticulitis   . GERD (gastroesophageal reflux disease)   . H/O hematuria    for several years, work only revealed kidney stone on right side  . History of chicken pox   . History of kidney stones   . Hyperlipidemia   . Hypertension   . Leg cramps, sleep related 04/02/2016  . Osteopenia 06/26/2014  . Osteoporosis 06/26/2014  . Plantar wart of [name] foot 10/15/2016  . SCC (squamous cell carcinoma)    arms     Social History   Social History  . Marital status: Single    Spouse name: N/A  . Number of children: N/A  . Years of education: N/A   Occupational History  . Not on file.   Social History Main Topics  . Smoking status: Never Smoker  . Smokeless tobacco: Never Used  . Alcohol use No  . Drug use: No  . Sexual activity: No     Comment: divorced, widowed, lives with elderly parents, is the caregiver, no dietary restrictions.    Other Topics Concern  . Not on file   Social History Narrative  . No narrative on file    Past Surgical History:  Procedure Laterality Date  . CHOLECYSTECTOMY  2005  . SKIN BIOPSY     multiple    Family History  Problem Relation Age of Onset  . Heart disease Mother        aortic stenosis  . Hyperlipidemia Mother   . Hypertension Mother   . Heart attack Mother   . Cancer Mother        BCC, SCC, skin  . Hypertension Father   .  Cancer Father        SCC, BCC, skin  . Early death Daughter   . Heart disease Maternal Grandfather        MI  . Tuberculosis Paternal Grandmother   . Cancer Paternal Grandfather        head and neck cancer, chewed tobacco  . Hypertension Daughter   . Hypertension Brother   . Hyperlipidemia Brother   . Birth defects Maternal Uncle     Allergies  Allergen Reactions  . Morphine And Related Swelling  . Statins Other (See Comments)    Myalgias, weakness  . Sulfa Antibiotics Swelling and Rash    Current Outpatient Prescriptions on File Prior to Visit  Medication Sig Dispense Refill  . ALPRAZolam (XANAX) 0.25 MG tablet Take 1 tablet (0.25 mg total) by mouth daily as needed for anxiety or sleep. 30 tablet 0  . aspirin 81 MG tablet Take 81 mg by mouth 3 (three) times a week.    . B Complex-C (SUPER B COMPLEX) TABS Take 1 tablet by mouth daily.    . carvedilol (COREG) 6.25 MG tablet TAKE 1 TABLET(6.25 MG) BY MOUTH TWICE DAILY  WITH A MEAL 60 tablet 5  . Coenzyme Q10 (COQ-10) 200 MG CAPS Take 1 capsule by mouth daily.    . Omega-3 Fatty Acids (FISH OIL) 1200 MG CAPS Take 1 capsule by mouth 2 (two) times daily.    [name] OVER THE COUNTER MEDICATION Citrical with Vit D3 630mg /500IU-Take 1 capsule by mouth daily.    . Probiotic Product (PROBIOTIC DAILY PO) Take by mouth daily.    . Red Yeast Rice 600 MG CAPS Take 1 capsule by mouth daily.     No current facility-administered medications on file prior to visit.     BP 140/78 (BP Location: Right Arm, Cuff Size: Normal)   Pulse 73   Temp 98.3 F (36.8 C) (Oral)   Resp 16   Ht 5\' 1"  (1.549 m)   Wt 137 lb 12.8 oz (62.5 kg)   SpO2 100%   BMI 26.04 kg/m    Objective:   Physical Exam  Constitutional: She appears well-developed and well-nourished.  Cardiovascular: Normal rate, regular rhythm and normal heart sounds.   No murmur heard. Pulmonary/Chest: Effort normal and breath sounds normal. No respiratory distress. She has no wheezes.    Abdominal: Soft. Bowel sounds are normal. She exhibits no distension.    Patient has mild guarding just right of midline in the epigastric area.   Psychiatric: She has a normal mood and affect. Her behavior is normal. Judgment and thought content normal.          Assessment & Plan:  Abdominal pain- Will obtain stat CT abd/pelvis.  Obtain CMET/CBC and lipase. Will obtain CT abdomen and pelvis.  I am concerned about the possibility of diverticulitis or pancreatitis.She understands that should her symptoms worsen she should proceed to the emergency department.

## 2017-01-06 NOTE — Telephone Encounter (Signed)
Reviewed results (diverticultis) with pt via phone.  Rx sent for cipro/flagyl for pt to begin tonight.  Advised pt also re: increased LFT and mild anemia.  She is advised to go to the ER if she develops fever >101, increased abdominal pain or rectal bleeding. Advised pt to maintain clear liquid diet for the next few days, then advance diet as tolerated.  Advised her that we should see her back in 1 week.    Please contact pt in AM and arrange a 1 week follow up with Dr. Charlett Blake. Also, I would like for her to complete an IFOB, dx anemia.  I forgot to tell her that they did note some small cysts in her liver and a cyst on her kidney. They appear benign.

## 2017-01-06 NOTE — Patient Instructions (Signed)
Please complete lab work prior to leaving. Return this afternoon for CT scan. Go to the ER if new/worsening abdominal pain.  We will contact you with results of your CT scan.

## 2017-01-07 NOTE — Telephone Encounter (Signed)
Notified pt and she voices understanding. Cannot access mychart at this time and requests that we mail CT / lab results to her. I will also send IFOB kit in the mail. PCP is out of the office in 1 week so f/u was scheduled with NP, Sophia [name] on 01/13/17 at 12:15pm.

## 2017-01-13 ENCOUNTER — Encounter: Payer: Self-pay | Admitting: Family

## 2017-01-13 ENCOUNTER — Ambulatory Visit (INDEPENDENT_AMBULATORY_CARE_PROVIDER_SITE_OTHER): Payer: Medicare HMO | Admitting: Family

## 2017-01-13 VITALS — BP 141/72 | HR 66 | Temp 98.1°F | Resp 18 | Ht 61.0 in | Wt 137.0 lb

## 2017-01-13 DIAGNOSIS — R945 Abnormal results of liver function studies: Secondary | ICD-10-CM

## 2017-01-13 DIAGNOSIS — K5792 Diverticulitis of intestine, part unspecified, without perforation or abscess without bleeding: Secondary | ICD-10-CM | POA: Diagnosis not present

## 2017-01-13 DIAGNOSIS — D649 Anemia, unspecified: Secondary | ICD-10-CM

## 2017-01-13 DIAGNOSIS — R7989 Other specified abnormal findings of blood chemistry: Secondary | ICD-10-CM

## 2017-01-13 LAB — CBC WITH DIFFERENTIAL/PLATELET
BASOS PCT: 0.2 % (ref 0.0–3.0)
Basophils Absolute: 0 10*3/uL (ref 0.0–0.1)
EOS ABS: 0 10*3/uL (ref 0.0–0.7)
Eosinophils Relative: 0.6 % (ref 0.0–5.0)
HCT: 38.9 % (ref 36.0–46.0)
HEMOGLOBIN: 13 g/dL (ref 12.0–15.0)
LYMPHS ABS: 2.1 10*3/uL (ref 0.7–4.0)
Lymphocytes Relative: 28.7 % (ref 12.0–46.0)
MCHC: 33.4 g/dL (ref 30.0–36.0)
MCV: 94.9 fl (ref 78.0–100.0)
MONO ABS: 0.5 10*3/uL (ref 0.1–1.0)
Monocytes Relative: 7.4 % (ref 3.0–12.0)
NEUTROS PCT: 63.1 % (ref 43.0–77.0)
Neutro Abs: 4.6 10*3/uL (ref 1.4–7.7)
PLATELETS: 241 10*3/uL (ref 150.0–400.0)
RBC: 4.1 Mil/uL (ref 3.87–5.11)
RDW: 13.8 % (ref 11.5–15.5)
WBC: 7.3 10*3/uL (ref 4.0–10.5)

## 2017-01-13 LAB — VITAMIN B12: VITAMIN B 12: 365 pg/mL (ref 211–911)

## 2017-01-13 LAB — IRON: Iron: 69 ug/dL (ref 42–145)

## 2017-01-13 LAB — COMPREHENSIVE METABOLIC PANEL
ALT: 25 U/L (ref 0–35)
AST: 35 U/L (ref 0–37)
Albumin: 4.2 g/dL (ref 3.5–5.2)
Alkaline Phosphatase: 73 U/L (ref 39–117)
BUN: 8 mg/dL (ref 6–23)
CALCIUM: 9.7 mg/dL (ref 8.4–10.5)
CHLORIDE: 104 meq/L (ref 96–112)
CO2: 23 meq/L (ref 19–32)
Creatinine, Ser: 0.77 mg/dL (ref 0.40–1.20)
GFR: 77.75 mL/min (ref >60.00)
GLUCOSE: 90 mg/dL (ref 70–99)
POTASSIUM: 4 meq/L (ref 3.5–5.1)
Sodium: 139 mEq/L (ref 135–145)
Total Bilirubin: 0.3 mg/dL (ref 0.2–1.2)
Total Protein: 6.9 g/dL (ref 6.0–8.3)

## 2017-01-13 LAB — FOLATE

## 2017-01-13 NOTE — Progress Notes (Signed)
Subjective:    Patient ID: Sophia [name], female    DOB: [birth year], 76 y.o.   MRN: [id number]  HPI  Sophia [name] is a 76 yr old female who presents today for follow up of her diverticulitis. She was seen on 01/06/17 with abdominal pain and CT revealed diverticulitis. She was started on cipro/flagyl.  Denies fevers.  Pain is improved greatly. Tolerating PO's, eating crackers.  Having formed stools. No blood.   Elevated lft- noted incidentally on last blood draw  Anemia- mild anemia noted last visit. She is working on [employer] and will return via mail.   Renal cyst (noted on CT).  R kidney, felt benign per radiologist. This was present back as far as 2002 on [place] per chart review.   Review of Systems     Past Medical History:  Diagnosis Date  . Anxiety and depression   . Arthritis    hands, knees, etc  . Bladder prolapse, female, acquired 09/16/2014  . Bowen's disease   . Diverticulitis   . GERD (gastroesophageal reflux disease)   . H/O hematuria    for several years, work only revealed kidney stone on right side  . History of chicken pox   . History of kidney stones   . Hyperlipidemia   . Hypertension   . Leg cramps, sleep related 04/02/2016  . Osteopenia 06/26/2014  . Osteoporosis 06/26/2014  . Plantar wart of [name] foot 10/15/2016  . SCC (squamous cell carcinoma)    arms     Social History   Social History  . Marital status: Single    Spouse name: N/A  . Number of children: N/A  . Years of education: N/A   Occupational History  . Not on file.   Social History Main Topics  . Smoking status: Never Smoker  . Smokeless tobacco: Never Used  . Alcohol use No  . Drug use: No  . Sexual activity: No     Comment: divorced, widowed, lives with elderly parents, is the caregiver, no dietary restrictions.    Other Topics Concern  . Not on file   Social History Narrative  . No narrative on file    Past Surgical History:  Procedure Laterality Date  .  CHOLECYSTECTOMY  2005  . SKIN BIOPSY     multiple    Family History  Problem Relation Age of Onset  . Heart disease Mother        aortic stenosis  . Hyperlipidemia Mother   . Hypertension Mother   . Heart attack Mother   . Cancer Mother        BCC, SCC, skin  . Hypertension Father   . Cancer Father        SCC, BCC, skin  . Early death Daughter   . Heart disease Maternal Grandfather        MI  . Tuberculosis Paternal Grandmother   . Cancer Paternal Grandfather        head and neck cancer, chewed tobacco  . Hypertension Daughter   . Hypertension Brother   . Hyperlipidemia Brother   . Birth defects Maternal Uncle     Allergies  Allergen Reactions  . Morphine And Related Swelling  . Statins Other (See Comments)    Myalgias, weakness  . Sulfa Antibiotics Swelling and Rash    Current Outpatient Prescriptions on File Prior to Visit  Medication Sig Dispense Refill  . ALPRAZolam (XANAX) 0.25 MG tablet Take 1 tablet (0.25 mg total) by mouth daily  as needed for anxiety or sleep. 30 tablet 0  . aspirin 81 MG tablet Take 81 mg by mouth 3 (three) times a week.    . B Complex-C (SUPER B COMPLEX) TABS Take 1 tablet by mouth daily.    . carvedilol (COREG) 6.25 MG tablet TAKE 1 TABLET(6.25 MG) BY MOUTH TWICE DAILY WITH A MEAL 60 tablet 5  . ciprofloxacin (CIPRO) 500 MG tablet Take 1 tablet (500 mg total) by mouth 2 (two) times daily. 20 tablet 0  . metroNIDAZOLE (FLAGYL) 500 MG tablet Take 1 tablet (500 mg total) by mouth 3 (three) times daily. 30 tablet 0  . Coenzyme Q10 (COQ-10) 200 MG CAPS Take 1 capsule by mouth daily.    . Omega-3 Fatty Acids (FISH OIL) 1200 MG CAPS Take 1 capsule by mouth 2 (two) times daily.    [name] OVER THE COUNTER MEDICATION Citrical with Vit D3 630mg /500IU-Take 1 capsule by mouth daily.    . Probiotic Product (PROBIOTIC DAILY PO) Take by mouth daily.    . Red Yeast Rice 600 MG CAPS Take 1 capsule by mouth daily.     No current facility-administered medications  on file prior to visit.     BP (!) 141/72 (BP Location: Right Arm, Cuff Size: Normal)   Pulse 66   Temp 98.1 F (36.7 C) (Oral)   Resp 18   Ht 5\' 1"  (1.549 m)   Wt 137 lb (62.1 kg)   SpO2 100%   BMI 25.89 kg/m    Objective:   Physical Exam  Constitutional: She appears well-developed and well-nourished.  Cardiovascular: Normal rate, regular rhythm and normal heart sounds.   No murmur heard. Pulmonary/Chest: Effort normal and breath sounds normal. No respiratory distress. She has no wheezes.  Abdominal: Soft. She exhibits no distension and no mass. There is no rebound and no guarding.  Mild RUQ tenderness to palpation without guarding.   Psychiatric: She has a normal mood and affect. Her behavior is normal. Judgment and thought content normal.          Assessment & Plan:  Diverticulitis- improving. Advised pt to advance diet as tolerated. She wants to know how to prevent future flares and we discussed importance of a high fiber diet and avoiding constipation.  Advised pt to complete antibiotics and to let us know if her symptoms fail to continue to improve.  Anemia- (normocytic)- await ifob. If + refer back to GI. Will also check follow up cbc today, iron (reports she has been intolerant to PO iron in the past due to GI side effects) b12 and folate.  Abnormal LFT- I suspect that this was reactive to her diverticulitis.  Check follow up lft today. If not improved will need further evaluation.

## 2017-01-13 NOTE — Patient Instructions (Addendum)
Please complete lab work prior to leaving. Advance your diet as tolerated. Complete antibiotics.  Please call if new/worsening symptoms or if your abdominal pain does not continue to improve. Work on maintaining a high fiber diet and adequate fluid intake. We will check your liver function tests again today. I suspect they were elevated due to your diverticulitis. If they are not improved we will need to do some additional evaluation. In regards to your anemia- I am going to check your iron level today, a follow up blood count and also a b12 and folate level. We will also await the results of your stool test for hidden blood. If + for blood you will need to to follow back up with Dr. Collene Mares- your GI doctor.  Liver and kidney cysts appear benign (non-cancerous).

## 2017-01-15 ENCOUNTER — Encounter: Payer: Self-pay | Admitting: Family

## 2017-01-19 ENCOUNTER — Other Ambulatory Visit (INDEPENDENT_AMBULATORY_CARE_PROVIDER_SITE_OTHER): Payer: Medicare HMO

## 2017-01-19 DIAGNOSIS — D649 Anemia, unspecified: Secondary | ICD-10-CM

## 2017-01-19 LAB — FECAL OCCULT BLOOD, IMMUNOCHEMICAL: Fecal Occult Bld: NEGATIVE

## 2017-01-21 DIAGNOSIS — L821 Other seborrheic keratosis: Secondary | ICD-10-CM | POA: Diagnosis not present

## 2017-01-21 DIAGNOSIS — D485 Neoplasm of uncertain behavior of skin: Secondary | ICD-10-CM | POA: Diagnosis not present

## 2017-01-21 DIAGNOSIS — L989 Disorder of the skin and subcutaneous tissue, unspecified: Secondary | ICD-10-CM | POA: Diagnosis not present

## 2017-01-21 DIAGNOSIS — L738 Other specified follicular disorders: Secondary | ICD-10-CM | POA: Diagnosis not present

## 2017-01-21 DIAGNOSIS — L570 Actinic keratosis: Secondary | ICD-10-CM | POA: Diagnosis not present

## 2017-01-21 DIAGNOSIS — D1801 Hemangioma of skin and subcutaneous tissue: Secondary | ICD-10-CM | POA: Diagnosis not present

## 2017-01-21 DIAGNOSIS — L723 Sebaceous cyst: Secondary | ICD-10-CM | POA: Diagnosis not present

## 2017-02-02 ENCOUNTER — Telehealth: Payer: Self-pay | Admitting: Family Medicine

## 2017-02-02 NOTE — Telephone Encounter (Signed)
Relation to IT:VIFX Call back [phone]   Reason for call:  [facility]: Rolm Bookbinder MD  will be faxing over biopsy report of right biceps to [phone] and would like PCP to review and patient would like to discuss,please advise

## 2017-02-03 NOTE — Telephone Encounter (Signed)
Received requested fax from Dermatology; forwarded to provider, placed on desk, as Dr. Charlett Blake is out of office today/SLS 08/08

## 2017-02-04 NOTE — Telephone Encounter (Signed)
Patient contacted and informed of PCP response to her concern.

## 2017-02-04 NOTE — Telephone Encounter (Signed)
Did review the pathology sent over by dermatology and I do agree the whole lesion should be excised when we are not sure about the pathology we lean towards removing the whole thing and doing more pathology to assure no malignancy. Please let patient know

## 2017-02-18 ENCOUNTER — Ambulatory Visit: Payer: Medicare HMO | Admitting: *Deleted

## 2017-02-24 DIAGNOSIS — L988 Other specified disorders of the skin and subcutaneous tissue: Secondary | ICD-10-CM | POA: Diagnosis not present

## 2017-02-24 DIAGNOSIS — D485 Neoplasm of uncertain behavior of skin: Secondary | ICD-10-CM | POA: Diagnosis not present

## 2017-02-25 DIAGNOSIS — H2513 Age-related nuclear cataract, bilateral: Secondary | ICD-10-CM | POA: Diagnosis not present

## 2017-02-25 DIAGNOSIS — H52203 Unspecified astigmatism, bilateral: Secondary | ICD-10-CM | POA: Diagnosis not present

## 2017-02-25 DIAGNOSIS — H04123 Dry eye syndrome of bilateral lacrimal glands: Secondary | ICD-10-CM | POA: Diagnosis not present

## 2017-03-04 DIAGNOSIS — Z4802 Encounter for removal of sutures: Secondary | ICD-10-CM | POA: Diagnosis not present

## 2017-03-04 DIAGNOSIS — L570 Actinic keratosis: Secondary | ICD-10-CM | POA: Diagnosis not present

## 2017-03-26 DIAGNOSIS — N8111 Cystocele, midline: Secondary | ICD-10-CM | POA: Diagnosis not present

## 2017-04-23 ENCOUNTER — Encounter: Payer: Self-pay | Admitting: Family Medicine

## 2017-04-23 ENCOUNTER — Ambulatory Visit (INDEPENDENT_AMBULATORY_CARE_PROVIDER_SITE_OTHER): Payer: Medicare HMO | Admitting: Family Medicine

## 2017-04-23 VITALS — BP 132/84 | HR 64 | Temp 97.4°F | Resp 18 | Ht 61.0 in | Wt 135.2 lb

## 2017-04-23 DIAGNOSIS — I10 Essential (primary) hypertension: Secondary | ICD-10-CM | POA: Diagnosis not present

## 2017-04-23 DIAGNOSIS — M25511 Pain in right shoulder: Secondary | ICD-10-CM | POA: Diagnosis not present

## 2017-04-23 DIAGNOSIS — E785 Hyperlipidemia, unspecified: Secondary | ICD-10-CM | POA: Diagnosis not present

## 2017-04-23 DIAGNOSIS — Z0000 Encounter for general adult medical examination without abnormal findings: Secondary | ICD-10-CM

## 2017-04-23 DIAGNOSIS — Z79899 Other long term (current) drug therapy: Secondary | ICD-10-CM

## 2017-04-23 DIAGNOSIS — M25512 Pain in left shoulder: Secondary | ICD-10-CM | POA: Insufficient documentation

## 2017-04-23 DIAGNOSIS — K5790 Diverticulosis of intestine, part unspecified, without perforation or abscess without bleeding: Secondary | ICD-10-CM

## 2017-04-23 DIAGNOSIS — M85829 Other specified disorders of bone density and structure, unspecified upper arm: Secondary | ICD-10-CM

## 2017-04-23 DIAGNOSIS — R109 Unspecified abdominal pain: Secondary | ICD-10-CM | POA: Diagnosis not present

## 2017-04-23 DIAGNOSIS — M791 Myalgia, unspecified site: Secondary | ICD-10-CM | POA: Diagnosis not present

## 2017-04-23 DIAGNOSIS — M810 Age-related osteoporosis without current pathological fracture: Secondary | ICD-10-CM

## 2017-04-23 HISTORY — DX: Pain in right shoulder: M25.511

## 2017-04-23 LAB — CBC WITH DIFFERENTIAL/PLATELET
BASOS ABS: 0 10*3/uL (ref 0.0–0.1)
Basophils Relative: 0.1 % (ref 0.0–3.0)
Eosinophils Absolute: 0.1 10*3/uL (ref 0.0–0.7)
Eosinophils Relative: 0.8 % (ref 0.0–5.0)
HEMATOCRIT: 41.2 % (ref 36.0–46.0)
Hemoglobin: 13.5 g/dL (ref 12.0–15.0)
Lymphocytes Relative: 36.7 % (ref 12.0–46.0)
Lymphs Abs: 2.5 10*3/uL (ref 0.7–4.0)
MCHC: 32.8 g/dL (ref 30.0–36.0)
MCV: 95.4 fl (ref 78.0–100.0)
MONOS PCT: 5 % (ref 3.0–12.0)
Monocytes Absolute: 0.3 10*3/uL (ref 0.1–1.0)
Neutro Abs: 3.9 10*3/uL (ref 1.4–7.7)
Neutrophils Relative %: 57.4 % (ref 43.0–77.0)
Platelets: 155 10*3/uL (ref 150.0–400.0)
RBC: 4.32 Mil/uL (ref 3.87–5.11)
RDW: 14.4 % (ref 11.5–15.5)
WBC: 6.8 10*3/uL (ref 4.0–10.5)

## 2017-04-23 LAB — COMPREHENSIVE METABOLIC PANEL
ALBUMIN: 4.5 g/dL (ref 3.5–5.2)
ALT: 14 U/L (ref 0–35)
AST: 21 U/L (ref 0–37)
Alkaline Phosphatase: 62 U/L (ref 39–117)
BILIRUBIN TOTAL: 0.7 mg/dL (ref 0.2–1.2)
BUN: 10 mg/dL (ref 6–23)
CALCIUM: 9.7 mg/dL (ref 8.4–10.5)
CO2: 25 mEq/L (ref 19–32)
Chloride: 103 mEq/L (ref 96–112)
Creatinine, Ser: 0.72 mg/dL (ref 0.40–1.20)
GFR: 83.95 mL/min (ref >60.00)
GLUCOSE: 85 mg/dL (ref 70–99)
Potassium: 4.1 mEq/L (ref 3.5–5.1)
Sodium: 139 mEq/L (ref 135–145)
TOTAL PROTEIN: 7.1 g/dL (ref 6.0–8.3)

## 2017-04-23 LAB — LIPID PANEL
Cholesterol: 241 mg/dL — ABNORMAL HIGH (ref 0–200)
HDL: 53.8 mg/dL (ref >39.00)
NonHDL: 187.42
TRIGLYCERIDES: 215 mg/dL — AB (ref 0.0–149.0)
Total CHOL/HDL Ratio: 4
VLDL: 43 mg/dL — ABNORMAL HIGH (ref 0.0–40.0)

## 2017-04-23 LAB — SEDIMENTATION RATE: Sed Rate: 19 mm/hr (ref 0–30)

## 2017-04-23 LAB — LDL CHOLESTEROL, DIRECT: Direct LDL: 150 mg/dL

## 2017-04-23 LAB — C-REACTIVE PROTEIN: CRP: 0.1 mg/dL — AB (ref 0.5–20.0)

## 2017-04-23 LAB — TSH: TSH: 1.22 u[IU]/mL (ref 0.35–4.50)

## 2017-04-23 MED ORDER — ALPRAZOLAM 0.25 MG PO TABS: 0.2500 mg | ORAL_TABLET | Freq: Every day | ORAL | 1 refills | Status: DC | PRN

## 2017-04-23 MED ORDER — CARVEDILOL 6.25 MG PO TABS: ORAL_TABLET | ORAL | 5 refills | Status: DC

## 2017-04-23 NOTE — Assessment & Plan Note (Signed)
Well controlled, no changes to meds. Encouraged heart healthy diet such as the DASH diet and exercise as tolerated.  °

## 2017-04-23 NOTE — Patient Instructions (Addendum)
Start Miralax and Benefiber mixed together mix in fluids and drink daily  Tylenol/Acetaminophen ES 500 mg tabs, 1-2 tabs twice daily, max of 3000 mg in 24 hours  Can try Melatonin for sleep, 2-10 mg daily 64 oz of clear fluids   Preventive Care 75 Years and Older, Female Preventive care refers to lifestyle choices and visits with your health care provider that can promote health and wellness. What does preventive care include?  A yearly physical exam. This is also called an annual well check.  Dental exams once or twice a year.  Routine eye exams. Ask your health care provider how often you should have your eyes checked.  Personal lifestyle choices, including: ? Daily care of your teeth and gums. ? Regular physical activity. ? Eating a healthy diet. ? Avoiding tobacco and drug use. ? Limiting alcohol use. ? Practicing safe sex. ? Taking low-dose aspirin every day. ? Taking vitamin and mineral supplements as recommended by your health care provider. What happens during an annual well check? The services and screenings done by your health care provider during your annual well check will depend on your age, overall health, lifestyle risk factors, and family history of disease. Counseling Your health care provider may ask you questions about your:  Alcohol use.  Tobacco use.  Drug use.  Emotional well-being.  Home and relationship well-being.  Sexual activity.  Eating habits.  History of falls.  Memory and ability to understand (cognition).  Work and work Statistician.  Reproductive health.  Screening You may have the following tests or measurements:  Height, weight, and BMI.  Blood pressure.  Lipid and cholesterol levels. These may be checked every 5 years, or more frequently if you are over 67 years old.  Skin check.  Lung cancer screening. You may have this screening every year starting at age 75 if you have a 30-pack-year history of smoking and  currently smoke or have quit within the past 15 years.  Fecal occult blood test (FOBT) of the stool. You may have this test every year starting at age 75.  Flexible sigmoidoscopy or colonoscopy. You may have a sigmoidoscopy every 5 years or a colonoscopy every 10 years starting at age 75.  Hepatitis C blood test.  Hepatitis B blood test.  Sexually transmitted disease (STD) testing.  Diabetes screening. This is done by checking your blood sugar (glucose) after you have not eaten for a while (fasting). You may have this done every 1-3 years.  Bone density scan. This is done to screen for osteoporosis. You may have this done starting at age 75.  Mammogram. This may be done every 1-2 years. Talk to your health care provider about how often you should have regular mammograms.  Talk with your health care provider about your test results, treatment options, and if necessary, the need for more tests. Vaccines Your health care provider may recommend certain vaccines, such as:  Influenza vaccine. This is recommended every year.  Tetanus, diphtheria, and acellular pertussis (Tdap, Td) vaccine. You may need a Td booster every 10 years.  Varicella vaccine. You may need this if you have not been vaccinated.  Zoster vaccine. You may need this after age 75.  Measles, mumps, and rubella (MMR) vaccine. You may need at least one dose of MMR if you were born in 1957 or later. You may also need a second dose.  Pneumococcal 13-valent conjugate (PCV13) vaccine. One dose is recommended after age 75.  Pneumococcal polysaccharide (PPSV23) vaccine. One dose  is recommended after age 75.  Meningococcal vaccine. You may need this if you have certain conditions.  Hepatitis A vaccine. You may need this if you have certain conditions or if you travel or work in places where you may be exposed to hepatitis A.  Hepatitis B vaccine. You may need this if you have certain conditions or if you travel or work in  places where you may be exposed to hepatitis B.  Haemophilus influenzae type b (Hib) vaccine. You may need this if you have certain conditions.  Talk to your health care provider about which screenings and vaccines you need and how often you need them. This information is not intended to replace advice given to you by your health care provider. Make sure you discuss any questions you have with your health care provider. Document Released: 07/12/2015 Document Revised: 03/04/2016 Document Reviewed: 04/16/2015 Elsevier Interactive Patient Education  2017 [employer].

## 2017-04-23 NOTE — Assessment & Plan Note (Signed)
Encouraged to get adequate exercise, calcium and vitamin d intake 

## 2017-04-23 NOTE — Progress Notes (Signed)
Subjective:  I acted as a Education administrator for Dr. Charlett Blake. [place], [place]  Patient ID: AAREN KROG, female    DOB: 1940-10-21, 76 y.o.   MRN: [id number]  No chief complaint on file.   HPI  Patient is in today for an annual exam and follow up on chronic medical concerns including hypertension, hyperlipidemia, reflux and arthritis. She feels well today but has been noting worsening trouble with myalgias at night for the past month. She is stiff in am for roughly 4 hours. Tylenol helps some. No recent febrile illness or hospitalization. Is trying to maintain a heart healthy diet and stay as active as able. Denies CP/palp/SOB/HA/congestion/fevers/GU c/o. Taking meds as prescribed  Patient Care Team: Mosie Lukes, MD as PCP - General (Family Medicine) Azucena Fallen, MD as Consulting Physician (Obstetrics and Gynecology) Rolm Bookbinder, MD as Consulting Physician (Dermatology) Katy Apo, MD as Consulting Physician (Ophthalmology)   Past Medical History:  Diagnosis Date  . Anxiety and depression   . Arthritis    hands, knees, etc  . Bladder prolapse, female, acquired 09/16/2014  . Bowen's disease   . Diverticulitis   . Diverticulosis   . GERD (gastroesophageal reflux disease)   . H/O hematuria    for several years, work only revealed kidney stone on right side  . History of chicken pox   . History of kidney stones   . Hyperlipidemia   . Hypertension   . Leg cramps, sleep related 04/02/2016  . Myalgia 04/25/2017  . Osteopenia 06/26/2014  . Osteoporosis 06/26/2014  . Plantar wart of left foot 10/15/2016  . Right shoulder pain 04/23/2017  . SCC (squamous cell carcinoma)    arms    Past Surgical History:  Procedure Laterality Date  . CHOLECYSTECTOMY  2005  . SKIN BIOPSY     multiple    Family History  Problem Relation Age of Onset  . Heart disease Mother        aortic stenosis  . Hyperlipidemia Mother   . Hypertension Mother   . Heart attack Mother   . Cancer Mother      BCC, SCC, skin  . Hypertension Father   . Cancer Father        SCC, BCC, skin  . Early death Daughter   . Heart disease Maternal Grandfather        MI  . Tuberculosis Paternal Grandmother   . Cancer Paternal Grandfather        head and neck cancer, chewed tobacco  . Hypertension Daughter   . Hypertension Brother   . Hyperlipidemia Brother   . Birth defects Maternal Uncle     Social History   Social History  . Marital status: Single    Spouse name: N/A  . Number of children: N/A  . Years of education: N/A   Occupational History  . Not on file.   Social History Main Topics  . Smoking status: Never Smoker  . Smokeless tobacco: Never Used  . Alcohol use No  . Drug use: No  . Sexual activity: No     Comment: divorced, widowed, lives with elderly parents, is the caregiver, no dietary restrictions.    Other Topics Concern  . Not on file   Social History Narrative  . No narrative on file    Outpatient Medications Prior to Visit  Medication Sig Dispense Refill  . aspirin 81 MG tablet Take 81 mg by mouth 3 (three) times a week.    . B Complex-C (SUPER  B COMPLEX) TABS Take 1 tablet by mouth daily.    . ciprofloxacin (CIPRO) 500 MG tablet Take 1 tablet (500 mg total) by mouth 2 (two) times daily. 20 tablet 0  . Coenzyme Q10 (COQ-10) 200 MG CAPS Take 1 capsule by mouth daily.    . metroNIDAZOLE (FLAGYL) 500 MG tablet Take 1 tablet (500 mg total) by mouth 3 (three) times daily. 30 tablet 0  . Omega-3 Fatty Acids (FISH OIL) 1200 MG CAPS Take 1 capsule by mouth 2 (two) times daily.    [name] OVER THE COUNTER MEDICATION Citrical with Vit D3 630mg /500IU-Take 1 capsule by mouth daily.    . Probiotic Product (PROBIOTIC DAILY PO) Take by mouth daily.    . Red Yeast Rice 600 MG CAPS Take 1 capsule by mouth daily.    [name] ALPRAZolam (XANAX) 0.25 MG tablet Take 1 tablet (0.25 mg total) by mouth daily as needed for anxiety or sleep. 30 tablet 0  . carvedilol (COREG) 6.25 MG tablet TAKE 1  TABLET(6.25 MG) BY MOUTH TWICE DAILY WITH A MEAL 60 tablet 5   No facility-administered medications prior to visit.     Allergies  Allergen Reactions  . Morphine And Related Swelling  . Statins Other (See Comments)    Myalgias, weakness  . Sulfa Antibiotics Swelling and Rash    Review of Systems  Constitutional: Negative for fever and malaise/fatigue.  HENT: Negative for congestion.   Eyes: Negative for blurred vision.  Respiratory: Negative for shortness of breath.   Cardiovascular: Negative for chest pain, palpitations and leg swelling.  Gastrointestinal: Positive for heartburn. Negative for abdominal pain, blood in stool and nausea.  Genitourinary: Negative for dysuria and frequency.  Musculoskeletal: Positive for joint pain and myalgias. Negative for falls.  Skin: Negative for rash.  Neurological: Negative for dizziness, loss of consciousness and headaches.  Endo/Heme/Allergies: Negative for environmental allergies.  Psychiatric/Behavioral: Negative for depression. The patient is not nervous/anxious.        Objective:    Physical Exam  Constitutional: She is oriented to person, place, and time. She appears well-developed and well-nourished. No distress.  HENT:  Head: Normocephalic and atraumatic.  Eyes: Conjunctivae are normal.  Neck: Neck supple. No thyromegaly present.  Cardiovascular: Normal rate, regular rhythm and normal heart sounds.   No murmur heard. Pulmonary/Chest: Effort normal and breath sounds normal. No respiratory distress.  Abdominal: Soft. Bowel sounds are normal. She exhibits no distension and no mass. There is no tenderness.  Musculoskeletal: She exhibits no edema.  Lymphadenopathy:    She has no cervical adenopathy.  Neurological: She is alert and oriented to person, place, and time.  Skin: Skin is warm and dry.  Psychiatric: She has a normal mood and affect. Her behavior is normal.    BP 132/84   Pulse 64   Temp (!) 97.4 F (36.3 C) (Oral)    Resp 18   Ht 5\' 1"  (1.549 m)   Wt 135 lb 3.2 oz (61.3 kg)   SpO2 96%   BMI 25.55 kg/m  Wt Readings from Last 3 Encounters:  04/23/17 135 lb 3.2 oz (61.3 kg)  01/13/17 137 lb (62.1 kg)  01/06/17 137 lb 12.8 oz (62.5 kg)   BP Readings from Last 3 Encounters:  04/25/17 132/84  01/13/17 (!) 141/72  01/06/17 140/78     Immunization History  Administered Date(s) Administered  . Influenza, High Dose Seasonal PF 03/24/2017  . Influenza,inj,Quad PF,6+ Mos 03/19/2015  . Influenza-Unspecified 04/29/2014  . Tdap 12/10/2016  Health Maintenance  Topic Date Due  . PNA vac Low Risk Adult (1 of 2 - PCV13) 05/26/2007  . COLONOSCOPY  10/28/2018  . TETANUS/TDAP  12/11/2026  . INFLUENZA VACCINE  Addressed  . DEXA SCAN  Completed    Lab Results  Component Value Date   WBC 6.8 04/23/2017   HGB 13.5 04/23/2017   HCT 41.2 04/23/2017   PLT 155.0 04/23/2017   GLUCOSE 85 04/23/2017   CHOL 241 (H) 04/23/2017   TRIG 215.0 (H) 04/23/2017   HDL 53.80 04/23/2017   LDLDIRECT 150.0 04/23/2017   LDLCALC 130 (H) 03/12/2015   ALT 14 04/23/2017   AST 21 04/23/2017   NA 139 04/23/2017   K 4.1 04/23/2017   CL 103 04/23/2017   CREATININE 0.72 04/23/2017   BUN 10 04/23/2017   CO2 25 04/23/2017   TSH 1.22 04/23/2017    Lab Results  Component Value Date   TSH 1.22 04/23/2017   Lab Results  Component Value Date   WBC 6.8 04/23/2017   HGB 13.5 04/23/2017   HCT 41.2 04/23/2017   MCV 95.4 04/23/2017   PLT 155.0 04/23/2017   Lab Results  Component Value Date   NA 139 04/23/2017   K 4.1 04/23/2017   CO2 25 04/23/2017   GLUCOSE 85 04/23/2017   BUN 10 04/23/2017   CREATININE 0.72 04/23/2017   BILITOT 0.7 04/23/2017   ALKPHOS 62 04/23/2017   AST 21 04/23/2017   ALT 14 04/23/2017   PROT 7.1 04/23/2017   ALBUMIN 4.5 04/23/2017   CALCIUM 9.7 04/23/2017   GFR 83.95 04/23/2017   Lab Results  Component Value Date   CHOL 241 (H) 04/23/2017   Lab Results  Component Value Date   HDL  53.80 04/23/2017   Lab Results  Component Value Date   LDLCALC 130 (H) 03/12/2015   Lab Results  Component Value Date   TRIG 215.0 (H) 04/23/2017   Lab Results  Component Value Date   CHOLHDL 4 04/23/2017   No results found for: HGBA1C       Assessment & Plan:   Problem List Items Addressed This Visit    Hypertension    Well controlled, no changes to meds. Encouraged heart healthy diet such as the DASH diet and exercise as tolerated.       Relevant Medications   carvedilol (COREG) 6.25 MG tablet   Other Relevant Orders   CBC with Differential/Platelet (Completed)   Comprehensive metabolic panel (Completed)   TSH (Completed)   Hyperlipidemia    Encouraged heart healthy diet, increase exercise, avoid trans fats, consider a krill oil cap daily. Tried stopping Red Yeast Rice to see if her myalgias improved but they did not so she restarted.      Relevant Medications   carvedilol (COREG) 6.25 MG tablet   Other Relevant Orders   Lipid panel (Completed)   Diverticulosis    Greatly improved from her diverticulitis flare. Is moving bowels encouraged to start Miralax/Benefiber combo daily      Osteoporosis    Encouraged to get adequate exercise, calcium and vitamin d intake      Preventative health care    Patient encouraged to maintain heart healthy diet, regular exercise, adequate sleep. Consider daily probiotics. Take medications as prescribed      Right shoulder pain   RESOLVED: Abdominal pain    No recent       Relevant Orders   C-reactive protein (Completed)   Sedimentation rate (Completed)   Myalgia  Labs unremarkable for obvious cause. Encouraged adequate hydration adequate rest and clean diet. May continue pain meds as needed, uds and contract up to date       Other Visit Diagnoses    High risk medication use    -  Primary   Relevant Orders   Pain Mgmt, Profile 8 w/Conf, U (Completed)   Osteopenia of upper arm, unspecified laterality           I am having Ms. Yi maintain her aspirin, Fish Oil, OVER THE COUNTER MEDICATION, CoQ-10, Red Yeast Rice, SUPER B COMPLEX, Probiotic Product (PROBIOTIC DAILY PO), metroNIDAZOLE, ciprofloxacin, carvedilol, and ALPRAZolam.  Meds ordered this encounter  Medications  . carvedilol (COREG) 6.25 MG tablet    Sig: TAKE 1 TABLET(6.25 MG) BY MOUTH TWICE DAILY WITH A MEAL    Dispense:  60 tablet    Refill:  5  . ALPRAZolam (XANAX) 0.25 MG tablet    Sig: Take 1 tablet (0.25 mg total) by mouth daily as needed for anxiety or sleep.    Dispense:  30 tablet    Refill:  1    CMA served as scribe during this visit. History, Physical and Plan performed by medical provider. Documentation and orders reviewed and attested to.  Penni Homans, MD

## 2017-04-23 NOTE — Assessment & Plan Note (Signed)
Patient encouraged to maintain heart healthy diet, regular exercise, adequate sleep. Consider daily probiotics. Take medications as prescribed 

## 2017-04-23 NOTE — Assessment & Plan Note (Signed)
Encouraged heart healthy diet, increase exercise, avoid trans fats, consider a krill oil cap daily. Tried stopping Red Yeast Rice to see if her myalgias improved but they did not so she restarted.

## 2017-04-23 NOTE — Assessment & Plan Note (Signed)
Greatly improved from her diverticulitis flare. Is moving bowels encouraged to start Miralax/Benefiber combo daily

## 2017-04-24 LAB — PAIN MGMT, PROFILE 8 W/CONF, U
6 ACETYLMORPHINE: NEGATIVE ng/mL (ref <10)
ALCOHOL METABOLITES: NEGATIVE ng/mL (ref <500)
Amphetamines: NEGATIVE ng/mL (ref <500)
BUPRENORPHINE, URINE: NEGATIVE ng/mL (ref <5)
Benzodiazepines: NEGATIVE ng/mL (ref <100)
Cocaine Metabolite: NEGATIVE ng/mL (ref <150)
Creatinine: 17 mg/dL — ABNORMAL LOW
MDMA: NEGATIVE ng/mL (ref <500)
Marijuana Metabolite: NEGATIVE ng/mL (ref <20)
OPIATES: NEGATIVE ng/mL (ref <100)
OXYCODONE: NEGATIVE ng/mL (ref <100)
Oxidant: NEGATIVE ug/mL (ref <200)
PH: 6.8 (ref 4.5–9.0)
Specific Gravity: 1.003 (ref >=1.0)

## 2017-04-25 ENCOUNTER — Encounter: Payer: Self-pay | Admitting: Family Medicine

## 2017-04-25 DIAGNOSIS — G8929 Other chronic pain: Secondary | ICD-10-CM | POA: Insufficient documentation

## 2017-04-25 DIAGNOSIS — R109 Unspecified abdominal pain: Secondary | ICD-10-CM | POA: Insufficient documentation

## 2017-04-25 DIAGNOSIS — M791 Myalgia, unspecified site: Secondary | ICD-10-CM

## 2017-04-25 HISTORY — DX: Myalgia, unspecified site: M79.10

## 2017-04-25 NOTE — Assessment & Plan Note (Signed)
Labs unremarkable for obvious cause. Encouraged adequate hydration adequate rest and clean diet. May continue pain meds as needed, uds and contract up to date

## 2017-04-25 NOTE — Assessment & Plan Note (Signed)
No recent

## 2017-05-24 DIAGNOSIS — Z1231 Encounter for screening mammogram for malignant neoplasm of breast: Secondary | ICD-10-CM | POA: Diagnosis not present

## 2017-07-17 DIAGNOSIS — Z0100 Encounter for examination of eyes and vision without abnormal findings: Secondary | ICD-10-CM | POA: Diagnosis not present

## 2017-10-05 DIAGNOSIS — N8111 Cystocele, midline: Secondary | ICD-10-CM | POA: Diagnosis not present

## 2017-10-08 ENCOUNTER — Other Ambulatory Visit: Payer: Self-pay | Admitting: Family Medicine

## 2017-10-12 ENCOUNTER — Encounter: Payer: Self-pay | Admitting: Family Medicine

## 2017-10-12 ENCOUNTER — Ambulatory Visit (INDEPENDENT_AMBULATORY_CARE_PROVIDER_SITE_OTHER): Payer: Medicare HMO | Admitting: Family Medicine

## 2017-10-12 VITALS — BP 138/90 | HR 62 | Temp 97.4°F | Resp 18 | Wt 137.2 lb

## 2017-10-12 DIAGNOSIS — E782 Mixed hyperlipidemia: Secondary | ICD-10-CM | POA: Diagnosis not present

## 2017-10-12 DIAGNOSIS — F32A Depression, unspecified: Secondary | ICD-10-CM

## 2017-10-12 DIAGNOSIS — M810 Age-related osteoporosis without current pathological fracture: Secondary | ICD-10-CM

## 2017-10-12 DIAGNOSIS — K219 Gastro-esophageal reflux disease without esophagitis: Secondary | ICD-10-CM | POA: Diagnosis not present

## 2017-10-12 DIAGNOSIS — F419 Anxiety disorder, unspecified: Secondary | ICD-10-CM

## 2017-10-12 DIAGNOSIS — R0989 Other specified symptoms and signs involving the circulatory and respiratory systems: Secondary | ICD-10-CM

## 2017-10-12 DIAGNOSIS — I10 Essential (primary) hypertension: Secondary | ICD-10-CM

## 2017-10-12 DIAGNOSIS — F329 Major depressive disorder, single episode, unspecified: Secondary | ICD-10-CM | POA: Diagnosis not present

## 2017-10-12 DIAGNOSIS — Z79899 Other long term (current) drug therapy: Secondary | ICD-10-CM

## 2017-10-12 MED ORDER — CARVEDILOL 6.25 MG PO TABS: 6.2500 mg | ORAL_TABLET | Freq: Two times a day (BID) | ORAL | 1 refills | Status: DC

## 2017-10-12 NOTE — Progress Notes (Signed)
Subjective:  I acted as a Education administrator for Dr. Charlett Blake. Sophia [name], [place]  Patient ID: Sophia [name], female    DOB: [birth year], 77 y.o.   MRN: [id number]  No chief complaint on file.   HPI  Patient is in today for a follow up and she notes overall she is doing well. She has changed to a plant based diet and she reports feeling better as well. She is moving her bowels better and comfortably. No recent febrile illness or hospitalizations. Notes some whooshing in her ear at times. No pain or discharge. No tinnitus or headache. Denies CP/palp/SOB/HA/congestion/fevers/GI or GU c/o. Taking meds as prescribed  Patient Care Team: Mosie Lukes, MD as PCP - General (Family Medicine) Azucena Fallen, MD as Consulting Physician (Obstetrics and Gynecology) Rolm Bookbinder, MD as Consulting Physician (Dermatology) Katy Apo, MD as Consulting Physician (Ophthalmology)   Past Medical History:  Diagnosis Date  . Anxiety and depression   . Arthritis    hands, knees, etc  . Bladder prolapse, female, acquired 09/16/2014  . Bowen's disease   . Diverticulitis   . Diverticulosis   . GERD (gastroesophageal reflux disease)   . H/O hematuria    for several years, work only revealed kidney stone on right side  . History of chicken pox   . History of kidney stones   . Hyperlipidemia   . Hypertension   . Leg cramps, sleep related 04/02/2016  . Myalgia 04/25/2017  . Osteopenia 06/26/2014  . Osteoporosis 06/26/2014  . Plantar wart of left foot 10/15/2016  . Right shoulder pain 04/23/2017  . SCC (squamous cell carcinoma)    arms    Past Surgical History:  Procedure Laterality Date  . CHOLECYSTECTOMY  2005  . SKIN BIOPSY     multiple    Family History  Problem Relation Age of Onset  . Heart disease Mother        aortic stenosis  . Hyperlipidemia Mother   . Hypertension Mother   . Heart attack Mother   . Cancer Mother        BCC, SCC, skin  . Hypertension Father   . Cancer Father        SCC,  BCC, skin  . Early death Daughter   . Heart disease Maternal Grandfather        MI  . Tuberculosis Paternal Grandmother   . Cancer Paternal Grandfather        head and neck cancer, chewed tobacco  . Hypertension Daughter   . Hypertension Brother   . Hyperlipidemia Brother   . Birth defects Maternal Uncle     Social History   Socioeconomic History  . Marital status: Single    Spouse name: Not on file  . Number of children: Not on file  . Years of education: Not on file  . Highest education level: Not on file  Occupational History  . Not on file  Social Needs  . Financial resource strain: Not on file  . Food insecurity:    Worry: Not on file    Inability: Not on file  . Transportation needs:    Medical: Not on file    Non-medical: Not on file  Tobacco Use  . Smoking status: Never Smoker  . Smokeless tobacco: Never Used  Substance and Sexual Activity  . Alcohol use: No    Alcohol/week: 0.0 oz  . Drug use: No  . Sexual activity: Never    Comment: divorced, widowed, lives with elderly parents, is the  caregiver, no dietary restrictions.   Lifestyle  . Physical activity:    Days per week: Not on file    Minutes per session: Not on file  . Stress: Not on file  Relationships  . Social connections:    Talks on phone: Not on file    Gets together: Not on file    Attends religious service: Not on file    Active member of club or organization: Not on file    Attends meetings of clubs or organizations: Not on file    Relationship status: Not on file  . Intimate partner violence:    Fear of current or ex partner: Not on file    Emotionally abused: Not on file    Physically abused: Not on file    Forced sexual activity: Not on file  Other Topics Concern  . Not on file  Social History Narrative  . Not on file    Outpatient Medications Prior to Visit  Medication Sig Dispense Refill  . ALPRAZolam (XANAX) 0.25 MG tablet Take 1 tablet (0.25 mg total) by mouth daily as  needed for anxiety or sleep. 30 tablet 1  . aspirin 81 MG tablet Take 81 mg by mouth 3 (three) times a week.    . B Complex-C (SUPER B COMPLEX) TABS Take 1 tablet by mouth daily.    . Coenzyme Q10 (COQ-10) 200 MG CAPS Take 1 capsule by mouth daily.    . Omega-3 Fatty Acids (FISH OIL) 1200 MG CAPS Take 1 capsule by mouth 2 (two) times daily.    [name] OVER THE COUNTER MEDICATION Citrical with Vit D3 630mg /500IU-Take 1 capsule by mouth daily.    . Probiotic Product (PROBIOTIC DAILY PO) Take by mouth daily.    . carvedilol (COREG) 6.25 MG tablet TAKE 1 TABLET(6.25 MG) BY MOUTH TWICE DAILY WITH A MEAL 60 tablet 0  . ciprofloxacin (CIPRO) 500 MG tablet Take 1 tablet (500 mg total) by mouth 2 (two) times daily. 20 tablet 0  . metroNIDAZOLE (FLAGYL) 500 MG tablet Take 1 tablet (500 mg total) by mouth 3 (three) times daily. 30 tablet 0  . Red Yeast Rice 600 MG CAPS Take 1 capsule by mouth daily.     No facility-administered medications prior to visit.     Allergies  Allergen Reactions  . Morphine And Related Swelling  . Statins Other (See Comments)    Myalgias, weakness  . Sulfa Antibiotics Swelling and Rash    Review of Systems  Constitutional: Negative for fever and malaise/fatigue.  HENT: Negative for congestion.   Eyes: Negative for blurred vision.  Respiratory: Negative for shortness of breath.   Cardiovascular: Negative for chest pain, palpitations and leg swelling.  Gastrointestinal: Negative for abdominal pain, blood in stool and nausea.  Genitourinary: Negative for dysuria and frequency.  Musculoskeletal: Negative for falls.  Skin: Negative for rash.  Neurological: Negative for dizziness, loss of consciousness and headaches.  Endo/Heme/Allergies: Negative for environmental allergies.  Psychiatric/Behavioral: Negative for depression. The patient is not nervous/anxious.        Objective:    Physical Exam  Constitutional: She is oriented to person, place, and time. She appears  well-developed and well-nourished. No distress.  HENT:  Head: Normocephalic and atraumatic.  Nose: Nose normal.  Eyes: Right eye exhibits no discharge. Left eye exhibits no discharge.  Neck: Normal range of motion. Neck supple.  Cardiovascular: Normal rate and regular rhythm.  No murmur heard. Pulmonary/Chest: Effort normal and breath sounds normal.  Abdominal:  Soft. Bowel sounds are normal. There is no tenderness.  Musculoskeletal: She exhibits no edema.  Neurological: She is alert and oriented to person, place, and time.  Skin: Skin is warm and dry.  Psychiatric: She has a normal mood and affect.  Nursing note and vitals reviewed.   BP 138/90 (BP Location: Left Arm, Patient Position: Sitting, Cuff Size: Normal)   Pulse 62   Temp (!) 97.4 F (36.3 C) (Oral)   Resp 18   Wt 137 lb 3.2 oz (62.2 kg)   SpO2 99%   BMI 25.92 kg/m  Wt Readings from Last 3 Encounters:  10/12/17 137 lb 3.2 oz (62.2 kg)  04/23/17 135 lb 3.2 oz (61.3 kg)  01/13/17 137 lb (62.1 kg)   BP Readings from Last 3 Encounters:  10/12/17 138/90  04/25/17 132/84  01/13/17 (!) 141/72     Immunization History  Administered Date(s) Administered  . Influenza, High Dose Seasonal PF 03/24/2017  . Influenza,inj,Quad PF,6+ Mos 03/19/2015  . Influenza-Unspecified 04/29/2014  . Tdap 12/10/2016    Health Maintenance  Topic Date Due  . PNA vac Low Risk Adult (1 of 2 - PCV13) 05/26/2007  . INFLUENZA VACCINE  01/27/2018  . COLONOSCOPY  10/28/2018  . TETANUS/TDAP  12/11/2026  . DEXA SCAN  Completed    Lab Results  Component Value Date   WBC 6.8 04/23/2017   HGB 13.5 04/23/2017   HCT 41.2 04/23/2017   PLT 155.0 04/23/2017   GLUCOSE 85 04/23/2017   CHOL 241 (H) 04/23/2017   TRIG 215.0 (H) 04/23/2017   HDL 53.80 04/23/2017   LDLDIRECT 150.0 04/23/2017   LDLCALC 130 (H) 03/12/2015   ALT 14 04/23/2017   AST 21 04/23/2017   NA 139 04/23/2017   K 4.1 04/23/2017   CL 103 04/23/2017   CREATININE 0.72  04/23/2017   BUN 10 04/23/2017   CO2 25 04/23/2017   TSH 1.22 04/23/2017    Lab Results  Component Value Date   TSH 1.22 04/23/2017   Lab Results  Component Value Date   WBC 6.8 04/23/2017   HGB 13.5 04/23/2017   HCT 41.2 04/23/2017   MCV 95.4 04/23/2017   PLT 155.0 04/23/2017   Lab Results  Component Value Date   NA 139 04/23/2017   K 4.1 04/23/2017   CO2 25 04/23/2017   GLUCOSE 85 04/23/2017   BUN 10 04/23/2017   CREATININE 0.72 04/23/2017   BILITOT 0.7 04/23/2017   ALKPHOS 62 04/23/2017   AST 21 04/23/2017   ALT 14 04/23/2017   PROT 7.1 04/23/2017   ALBUMIN 4.5 04/23/2017   CALCIUM 9.7 04/23/2017   GFR 83.95 04/23/2017   Lab Results  Component Value Date   CHOL 241 (H) 04/23/2017   Lab Results  Component Value Date   HDL 53.80 04/23/2017   Lab Results  Component Value Date   LDLCALC 130 (H) 03/12/2015   Lab Results  Component Value Date   TRIG 215.0 (H) 04/23/2017   Lab Results  Component Value Date   CHOLHDL 4 04/23/2017   No results found for: HGBA1C       Assessment & Plan:   Problem List Items Addressed This Visit    GERD (gastroesophageal reflux disease)    Avoid offending foods, start probiotics. Do not eat large meals in late evening and consider raising head of bed.       Hypertension    Well controlled, no changes to meds. Encouraged heart healthy diet such as the DASH diet  and exercise as tolerated.       Relevant Medications   carvedilol (COREG) 6.25 MG tablet   Hyperlipidemia    Encouraged heart healthy diet, increase exercise, avoid trans fats, consider a krill oil cap daily      Relevant Medications   carvedilol (COREG) 6.25 MG tablet   Anxiety and depression    Doing well recently. May use Alprazolam prn infrequently. UDS and contract updated.       Osteoporosis    Encouraged to get adequate exercise, calcium and vitamin d intake       Other Visit Diagnoses    High risk medication use    -  Primary   Relevant  Orders   Pain Mgmt, Profile 8 w/Conf, U (Completed)   Bruit of left carotid artery       Relevant Orders   US Carotid Bilateral (Completed)      I have discontinued Sophia [name]'s Red Yeast Rice, metroNIDAZOLE, and ciprofloxacin. I have also changed her carvedilol. Additionally, I am having her maintain her aspirin, Fish Oil, OVER THE COUNTER MEDICATION, CoQ-10, SUPER B COMPLEX, Probiotic Product (PROBIOTIC DAILY PO), and ALPRAZolam.  Meds ordered this encounter  Medications  . carvedilol (COREG) 6.25 MG tablet    Sig: Take 1 tablet (6.25 mg total) by mouth 2 (two) times daily.    Dispense:  180 tablet    Refill:  1    CMA served as scribe during this visit. History, Physical and Plan performed by medical provider. Documentation and orders reviewed and attested to.  Penni Homans, MD

## 2017-10-12 NOTE — Patient Instructions (Signed)
Nasal spray twice daily  Lidocaine gel, aspercreme, Icy Hot or Salon Pas  Hypertension Hypertension, commonly called high blood pressure, is when the force of blood pumping through the arteries is too strong. The arteries are the blood vessels that carry blood from the heart throughout the body. Hypertension forces the heart to work harder to pump blood and may cause arteries to become narrow or stiff. Having untreated or uncontrolled hypertension can cause heart attacks, strokes, kidney disease, and other problems. A blood pressure reading consists of a higher number over a lower number. Ideally, your blood pressure should be below 120/80. The first ("top") number is called the systolic pressure. It is a measure of the pressure in your arteries as your heart beats. The second ("bottom") number is called the diastolic pressure. It is a measure of the pressure in your arteries as the heart relaxes. What are the causes? The cause of this condition is not known. What increases the risk? Some risk factors for high blood pressure are under your control. Others are not. Factors you can change  Smoking.  Having type 2 diabetes mellitus, high cholesterol, or both.  Not getting enough exercise or physical activity.  Being overweight.  Having too much fat, sugar, calories, or salt (sodium) in your diet.  Drinking too much alcohol. Factors that are difficult or impossible to change  Having chronic kidney disease.  Having a family history of high blood pressure.  Age. Risk increases with age.  Race. You may be at higher risk if you are African-American.  Gender. Men are at higher risk than women before age 59. After age 8, women are at higher risk than men.  Having obstructive sleep apnea.  Stress. What are the signs or symptoms? Extremely high blood pressure (hypertensive crisis) may cause:  Headache.  Anxiety.  Shortness of breath.  Nosebleed.  Nausea and vomiting.  Severe  chest pain.  Jerky movements you cannot control (seizures).  How is this diagnosed? This condition is diagnosed by measuring your blood pressure while you are seated, with your arm resting on a surface. The cuff of the blood pressure monitor will be placed directly against the skin of your upper arm at the level of your heart. It should be measured at least twice using the same arm. Certain conditions can cause a difference in blood pressure between your right and left arms. Certain factors can cause blood pressure readings to be lower or higher than normal (elevated) for a short period of time:  When your blood pressure is higher when you are in a health care provider's office than when you are at home, this is called white coat hypertension. Most people with this condition do not need medicines.  When your blood pressure is higher at home than when you are in a health care provider's office, this is called masked hypertension. Most people with this condition may need medicines to control blood pressure.  If you have a high blood pressure reading during one visit or you have normal blood pressure with other risk factors:  You may be asked to return on a different day to have your blood pressure checked again.  You may be asked to monitor your blood pressure at home for 1 week or longer.  If you are diagnosed with hypertension, you may have other blood or imaging tests to help your health care provider understand your overall risk for other conditions. How is this treated? This condition is treated by making healthy lifestyle  changes, such as eating healthy foods, exercising more, and reducing your alcohol intake. Your health care provider may prescribe medicine if lifestyle changes are not enough to get your blood pressure under control, and if:  Your systolic blood pressure is above 130.  Your diastolic blood pressure is above 80.  Your personal target blood pressure may vary depending on  your medical conditions, your age, and other factors. Follow these instructions at home: Eating and drinking  Eat a diet that is high in fiber and potassium, and low in sodium, added sugar, and fat. An example eating plan is called the DASH (Dietary Approaches to Stop Hypertension) diet. To eat this way: ? Eat plenty of fresh fruits and vegetables. Try to fill half of your plate at each meal with fruits and vegetables. ? Eat whole grains, such as whole wheat pasta, brown rice, or whole grain bread. Fill about one quarter of your plate with whole grains. ? Eat or drink low-fat dairy products, such as skim milk or low-fat yogurt. ? Avoid fatty cuts of meat, processed or cured meats, and poultry with skin. Fill about one quarter of your plate with lean proteins, such as fish, chicken without skin, beans, eggs, and tofu. ? Avoid premade and processed foods. These tend to be higher in sodium, added sugar, and fat.  Reduce your daily sodium intake. Most people with hypertension should eat less than 1,500 mg of sodium a day.  Limit alcohol intake to no more than 1 drink a day for nonpregnant women and 2 drinks a day for men. One drink equals 12 oz of beer, 5 oz of wine, or 1 oz of hard liquor. Lifestyle  Work with your health care provider to maintain a healthy body weight or to lose weight. Ask what an ideal weight is for you.  Get at least 30 minutes of exercise that causes your heart to beat faster (aerobic exercise) most days of the week. Activities may include walking, swimming, or biking.  Include exercise to strengthen your muscles (resistance exercise), such as pilates or lifting weights, as part of your weekly exercise routine. Try to do these types of exercises for 30 minutes at least 3 days a week.  Do not use any products that contain nicotine or tobacco, such as cigarettes and e-cigarettes. If you need help quitting, ask your health care provider.  Monitor your blood pressure at home  as told by your health care provider.  Keep all follow-up visits as told by your health care provider. This is important. Medicines  Take over-the-counter and prescription medicines only as told by your health care provider. Follow directions carefully. Blood pressure medicines must be taken as prescribed.  Do not skip doses of blood pressure medicine. Doing this puts you at risk for problems and can make the medicine less effective.  Ask your health care provider about side effects or reactions to medicines that you should watch for. Contact a health care provider if:  You think you are having a reaction to a medicine you are taking.  You have headaches that keep coming back (recurring).  You feel dizzy.  You have swelling in your ankles.  You have trouble with your vision. Get help right away if:  You develop a severe headache or confusion.  You have unusual weakness or numbness.  You feel faint.  You have severe pain in your chest or abdomen.  You vomit repeatedly.  You have trouble breathing. Summary  Hypertension is when the force  of blood pumping through your arteries is too strong. If this condition is not controlled, it may put you at risk for serious complications.  Your personal target blood pressure may vary depending on your medical conditions, your age, and other factors. For most people, a normal blood pressure is less than 120/80.  Hypertension is treated with lifestyle changes, medicines, or a combination of both. Lifestyle changes include weight loss, eating a healthy, low-sodium diet, exercising more, and limiting alcohol. This information is not intended to replace advice given to you by your health care provider. Make sure you discuss any questions you have with your health care provider. Document Released: 06/15/2005 Document Revised: 05/13/2016 Document Reviewed: 05/13/2016 Elsevier Interactive Patient Education  [employer].

## 2017-10-12 NOTE — Assessment & Plan Note (Signed)
Encouraged heart healthy diet, increase exercise, avoid trans fats, consider a krill oil cap daily 

## 2017-10-12 NOTE — Assessment & Plan Note (Signed)
Well controlled, no changes to meds. Encouraged heart healthy diet such as the DASH diet and exercise as tolerated.  °

## 2017-10-13 LAB — PAIN MGMT, PROFILE 8 W/CONF, U
6 ACETYLMORPHINE: NEGATIVE ng/mL (ref <10)
ALCOHOL METABOLITES: NEGATIVE ng/mL (ref <500)
Amphetamines: NEGATIVE ng/mL (ref <500)
Benzodiazepines: NEGATIVE ng/mL (ref <100)
Buprenorphine, Urine: NEGATIVE ng/mL (ref <5)
Cocaine Metabolite: NEGATIVE ng/mL (ref <150)
Creatinine: 17.9 mg/dL — ABNORMAL LOW
MDMA: NEGATIVE ng/mL (ref <500)
Marijuana Metabolite: NEGATIVE ng/mL (ref <20)
OPIATES: NEGATIVE ng/mL (ref <100)
OXIDANT: NEGATIVE ug/mL (ref <200)
OXYCODONE: NEGATIVE ng/mL (ref <100)
PH: 6.85 (ref 4.5–9.0)
Specific Gravity: 1.003 (ref >=1.0)

## 2017-10-14 ENCOUNTER — Ambulatory Visit (HOSPITAL_BASED_OUTPATIENT_CLINIC_OR_DEPARTMENT_OTHER)
Admission: RE | Admit: 2017-10-14 | Discharge: 2017-10-14 | Disposition: A | Discharge status: Home / Self Care | Payer: Medicare HMO | Source: Ambulatory Visit | Attending: Family Medicine | Admitting: Family Medicine

## 2017-10-14 ENCOUNTER — Ambulatory Visit: Payer: Medicare HMO | Admitting: Family Medicine

## 2017-10-14 DIAGNOSIS — I6523 Occlusion and stenosis of bilateral carotid arteries: Secondary | ICD-10-CM | POA: Diagnosis not present

## 2017-10-14 DIAGNOSIS — I771 Stricture of artery: Secondary | ICD-10-CM | POA: Insufficient documentation

## 2017-10-14 DIAGNOSIS — R0989 Other specified symptoms and signs involving the circulatory and respiratory systems: Secondary | ICD-10-CM | POA: Diagnosis not present

## 2017-10-18 NOTE — Assessment & Plan Note (Signed)
Avoid offending foods, start probiotics. Do not eat large meals in late evening and consider raising head of bed.  

## 2017-10-18 NOTE — Assessment & Plan Note (Signed)
Doing well recently. May use Alprazolam prn infrequently. UDS and contract updated.

## 2017-10-18 NOTE — Assessment & Plan Note (Signed)
Encouraged to get adequate exercise, calcium and vitamin d intake 

## 2017-11-24 ENCOUNTER — Other Ambulatory Visit: Payer: Self-pay | Admitting: Family Medicine

## 2017-12-16 ENCOUNTER — Ambulatory Visit: Payer: Medicare HMO | Admitting: *Deleted

## 2017-12-16 NOTE — Progress Notes (Addendum)
Subjective:   Sophia [name] is a 77 y.o. female who presents for Medicare Annual (Subsequent) preventive examination.  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 Jehovah's witness.  Review of Systems: No ROS.  Medicare Wellness Visit. Additional risk factors are reflected in the social history. Cardiac Risk Factors include: advanced age (>33men, >69 women);dyslipidemia;hypertension Sleep patterns:  Sleep varies. Takes Xanax as needed. Home Safety/Smoke Alarms: Feels safe in home. Smoke alarms in place.  Living environment; residence and Firearm Safety:Lives with daughter and her family.   Female:        Mammo- per pt last Nov 2018-normal      Dexa scan- ordered       CCS- Last reported 10/27/08. No longer doing routine screening due to age.  Dentist- yearly. Eye-Dr.Lyles yearly.    Objective:     Vitals: BP 134/82 (BP Location: Left Arm, Patient Position: Sitting, Cuff Size: Normal)   Pulse 71   Ht 5\' 1"  (1.549 m)   Wt 140 lb 6.4 oz (63.7 kg)   SpO2 96%   BMI 26.53 kg/m   Body mass index is 26.53 kg/m.  Advanced Directives 12/23/2017 12/15/2016 06/13/2015  Does Patient Have a Medical Advance Directive? Yes Yes Yes  Type of Paramedic of [place];Living will [place];Living will [place];Living will  Does patient want to make changes to medical advance directive? No - Patient declined - -  Copy of [place] in Chart? No - copy requested No - copy requested Yes    Tobacco Social History   Tobacco Use  Smoking Status Never Smoker  Smokeless Tobacco Never Used     Counseling given: Not Answered   Clinical Intake: Pain : No/denies pain     Past Medical History:  Diagnosis Date  . Anxiety and depression   . Arthritis    hands, knees, etc  . Bladder prolapse, female, acquired 09/16/2014  . Bowen's disease   . Diverticulitis   . Diverticulosis   . GERD (gastroesophageal reflux  disease)   . H/O hematuria    for several years, work only revealed kidney stone on right side  . History of chicken pox   . History of kidney stones   . Hyperlipidemia   . Hypertension   . Leg cramps, sleep related 04/02/2016  . Myalgia 04/25/2017  . Osteopenia 06/26/2014  . Osteoporosis 06/26/2014  . Plantar wart of left foot 10/15/2016  . Right shoulder pain 04/23/2017  . SCC (squamous cell carcinoma)    arms   Past Surgical History:  Procedure Laterality Date  . CHOLECYSTECTOMY  2005  . SKIN BIOPSY     multiple   Family History  Problem Relation Age of Onset  . Heart disease Mother        aortic stenosis  . Hyperlipidemia Mother   . Hypertension Mother   . Heart attack Mother   . Cancer Mother        BCC, SCC, skin  . Hypertension Father   . Cancer Father        SCC, BCC, skin  . Early death Daughter   . Heart disease Maternal Grandfather        MI  . Tuberculosis Paternal Grandmother   . Cancer Paternal Grandfather        head and neck cancer, chewed tobacco  . Hypertension Daughter   . Hypertension Brother   . Hyperlipidemia Brother   . Birth defects Maternal Uncle  Social History   Socioeconomic History  . Marital status: Single    Spouse name: Not on file  . Number of children: Not on file  . Years of education: Not on file  . Highest education level: Not on file  Occupational History  . Not on file  Social Needs  . Financial resource strain: Not on file  . Food insecurity:    Worry: Not on file    Inability: Not on file  . Transportation needs:    Medical: Not on file    Non-medical: Not on file  Tobacco Use  . Smoking status: Never Smoker  . Smokeless tobacco: Never Used  Substance and Sexual Activity  . Alcohol use: No    Alcohol/week: 0.0 oz  . Drug use: No  . Sexual activity: Never    Comment: divorced, widowed, lives with elderly parents, is the caregiver, no dietary restrictions.   Lifestyle  . Physical activity:    Days per  week: Not on file    Minutes per session: Not on file  . Stress: Not on file  Relationships  . Social connections:    Talks on phone: Not on file    Gets together: Not on file    Attends religious service: Not on file    Active member of club or organization: Not on file    Attends meetings of clubs or organizations: Not on file    Relationship status: Not on file  Other Topics Concern  . Not on file  Social History Narrative  . Not on file    Outpatient Encounter Medications as of 12/23/2017  Medication Sig  . ALPRAZolam (XANAX) 0.25 MG tablet Take 1 tablet (0.25 mg total) by mouth daily as needed for anxiety or sleep.  [name] aspirin 81 MG tablet Take 81 mg by mouth 3 (three) times a week.  . B Complex-C (SUPER B COMPLEX) TABS Take 1 tablet by mouth daily.  . carvedilol (COREG) 6.25 MG tablet Take 1 tablet (6.25 mg total) by mouth 2 (two) times daily.  . Coenzyme Q10 (COQ-10) 200 MG CAPS Take 1 capsule by mouth daily.  . Omega-3 Fatty Acids (FISH OIL) 1200 MG CAPS Take 1 capsule by mouth 2 (two) times daily.  [name] OVER THE COUNTER MEDICATION Citrical with Vit D3 630mg /500IU-Take 1 capsule by mouth daily.  . Probiotic Product (PROBIOTIC DAILY PO) Take by mouth daily.  . [DISCONTINUED] carvedilol (COREG) 6.25 MG tablet TAKE 1 TABLET(6.25 MG) BY MOUTH TWICE DAILY WITH A MEAL   No facility-administered encounter medications on file as of 12/23/2017.     Activities of Daily Living In your present state of health, do you have any difficulty performing the following activities: 12/23/2017  Hearing? N  Vision? N  Difficulty concentrating or making decisions? N  Walking or climbing stairs? N  Dressing or bathing? N  Doing errands, shopping? N  Preparing Food and eating ? N  Using the Toilet? N  In the past six months, have you accidently leaked urine? N  Do you have problems with loss of bowel control? N  Managing your Medications? N  Managing your Finances? N  Housekeeping or managing  your Housekeeping? N  Some recent data might be hidden    Patient Care Team: Mosie Lukes, MD as PCP - General (Family Medicine) Azucena Fallen, MD as Consulting Physician (Obstetrics and Gynecology) Rolm Bookbinder, MD as Consulting Physician (Dermatology) Katy Apo, MD as Consulting Physician (Ophthalmology)    Assessment:  This is a routine wellness examination for Yailin. Physical assessment deferred to PCP.  Exercise Activities and Dietary recommendations Current Exercise Habits: The patient does not participate in regular exercise at present, Exercise limited by: None identified   Diet (meal preparation, eat out, water intake, caffeinated beverages, dairy products, fruits and vegetables): in general, a "healthy" diet  , well balanced   Goals    . Schedule eye and dental exams.   (pt-stated)       Fall Risk Fall Risk  12/23/2017 12/15/2016 04/02/2016 06/13/2015 06/26/2014  Falls in the past year? No No No No No     Depression Screen PHQ 2/9 Scores 12/23/2017 12/15/2016 04/02/2016 06/13/2015  PHQ - 2 Score 0 0 0 1     Cognitive Function Ad8 score reviewed for issues:  Issues making decisions:no  Less interest in hobbies / activities:no  Repeats questions, stories (family complaining):no  Trouble using ordinary gadgets (microwave, computer, phone):no  Forgets the month or year: no  Mismanaging finances: no  Remembering appts:no  Daily problems with thinking and/or memory:no Ad8 score is=0     MMSE - Mini Mental State Exam 12/15/2016 06/13/2015  Orientation to time 5 5  Orientation to Place 5 5  Registration 3 3  Attention/ Calculation 5 5  Recall 3 3  Language- name 2 objects 2 2  Language- repeat 1 1  Language- follow 3 step command 3 3  Language- read & follow direction 1 1  Write a sentence 1 1  Copy design 1 1  Total score 30 30        Immunization History  Administered Date(s) Administered  . Influenza, High Dose Seasonal PF  03/24/2017  . Influenza,inj,Quad PF,6+ Mos 03/19/2015  . Influenza-Unspecified 04/29/2014  . Tdap 12/10/2016    Screening Tests Health Maintenance  Topic Date Due  . INFLUENZA VACCINE  01/27/2018  . COLONOSCOPY  10/28/2018  . TETANUS/TDAP  12/11/2026  . DEXA SCAN  Completed  . PNA vac Low Risk Adult  Completed       Plan:   Follow up with Dr.Blyth as scheduled  Please schedule your next medicare wellness visit with me in 1 yr.  Continue to eat heart healthy diet (full of fruits, vegetables, whole grains, lean protein, water--limit salt, fat, and sugar intake) and increase physical activity as tolerated.  Continue doing brain stimulating activities (puzzles, reading, adult coloring books, staying active) to keep memory sharp.    I have personally reviewed and noted the following in the patient's chart:   . Medical and social history . Use of alcohol, tobacco or illicit drugs  . Current medications and supplements . Functional ability and status . Nutritional status . Physical activity . Advanced directives . List of other physicians . Hospitalizations, surgeries, and ER visits in previous 12 months . Vitals . Screenings to include cognitive, depression, and falls . Referrals and appointments  In addition, I have reviewed and discussed with patient certain preventive protocols, quality metrics, and best practice recommendations. A written personalized care plan for preventive services as well as general preventive health recommendations were provided to patient.     Shela Nevin, [place]  12/23/2017  Medical screening examination/treatment was performed by qualified clinical staff member and as supervising physician I was immediately available for consultation/collaboration. I have reviewed documentation and agree with assessment and plan.  Penni Homans, MD

## 2017-12-23 ENCOUNTER — Ambulatory Visit (INDEPENDENT_AMBULATORY_CARE_PROVIDER_SITE_OTHER): Payer: Medicare HMO | Admitting: *Deleted

## 2017-12-23 ENCOUNTER — Ambulatory Visit (HOSPITAL_BASED_OUTPATIENT_CLINIC_OR_DEPARTMENT_OTHER)
Admission: RE | Admit: 2017-12-23 | Discharge: 2017-12-23 | Disposition: A | Discharge status: Home / Self Care | Payer: Medicare HMO | Source: Ambulatory Visit | Attending: Family Medicine | Admitting: Family Medicine

## 2017-12-23 ENCOUNTER — Encounter: Payer: Self-pay | Admitting: *Deleted

## 2017-12-23 VITALS — BP 134/82 | HR 71 | Ht 61.0 in | Wt 140.4 lb

## 2017-12-23 DIAGNOSIS — M810 Age-related osteoporosis without current pathological fracture: Secondary | ICD-10-CM | POA: Diagnosis not present

## 2017-12-23 DIAGNOSIS — Z780 Asymptomatic menopausal state: Secondary | ICD-10-CM

## 2017-12-23 DIAGNOSIS — M8588 Other specified disorders of bone density and structure, other site: Secondary | ICD-10-CM | POA: Diagnosis not present

## 2017-12-23 DIAGNOSIS — Z0000 Encounter for general adult medical examination without abnormal findings: Secondary | ICD-10-CM | POA: Diagnosis not present

## 2017-12-23 NOTE — Addendum Note (Signed)
Addended by: Naaman Plummer A on: 12/23/2017 01:33 PM   Modules accepted: Orders

## 2017-12-23 NOTE — Patient Instructions (Signed)
Follow up with Dr.Blyth as scheduled  Please schedule your next medicare wellness visit with me in 1 yr.  Continue to eat heart healthy diet (full of fruits, vegetables, whole grains, lean protein, water--limit salt, fat, and sugar intake) and increase physical activity as tolerated.  Continue doing brain stimulating activities (puzzles, reading, adult coloring books, staying active) to keep memory sharp.    Sophia [name] , Thank you for taking time to come for your Medicare Wellness Visit. I appreciate your ongoing commitment to your health goals. Please review the following plan we discussed and let me know if I can assist you in the future.   These are the goals we discussed: Goals    . Maintain current healthy active life.    . Schedule eye and dental exams.   (pt-stated)       This is a list of the screening recommended for you and due dates:  Health Maintenance  Topic Date Due  . Flu Shot  01/27/2018  . Colon Cancer Screening  10/28/2018  . Tetanus Vaccine  12/11/2026  . DEXA scan (bone density measurement)  Completed  . Pneumonia vaccines  Completed    Health Maintenance for Postmenopausal Women Menopause is a normal process in which your reproductive ability comes to an end. This process happens gradually over a span of months to years, usually between the ages of 57 and 66. Menopause is complete when you have missed 12 consecutive menstrual periods. It is important to talk with your health care provider about some of the most common conditions that affect postmenopausal women, such as heart disease, cancer, and bone loss (osteoporosis). Adopting a healthy lifestyle and getting preventive care can help to promote your health and wellness. Those actions can also lower your chances of developing some of these common conditions. What should I know about menopause? During menopause, you may experience a number of symptoms, such as:  Moderate-to-severe hot flashes.  Night  sweats.  Decrease in sex drive.  Mood swings.  Headaches.  Tiredness.  Irritability.  Memory problems.  Insomnia.  Choosing to treat or not to treat menopausal changes is an individual decision that you make with your health care provider. What should I know about hormone replacement therapy and supplements? Hormone therapy products are effective for treating symptoms that are associated with menopause, such as hot flashes and night sweats. Hormone replacement carries certain risks, especially as you become older. If you are thinking about using estrogen or estrogen with progestin treatments, discuss the benefits and risks with your health care provider. What should I know about heart disease and stroke? Heart disease, heart attack, and stroke become more likely as you age. This may be due, in part, to the hormonal changes that your body experiences during menopause. These can affect how your body processes dietary fats, triglycerides, and cholesterol. Heart attack and stroke are both medical emergencies. There are many things that you can do to help prevent heart disease and stroke:  Have your blood pressure checked at least every 1-2 years. High blood pressure causes heart disease and increases the risk of stroke.  If you are 56-40 years old, ask your health care provider if you should take aspirin to prevent a heart attack or a stroke.  Do not use any tobacco products, including cigarettes, chewing tobacco, or electronic cigarettes. If you need help quitting, ask your health care provider.  It is important to eat a healthy diet and maintain a healthy weight. ? Be sure  to include plenty of vegetables, fruits, low-fat dairy products, and lean protein. ? Avoid eating foods that are high in solid fats, added sugars, or salt (sodium).  Get regular exercise. This is one of the most important things that you can do for your health. ? Try to exercise for at least 150 minutes each week.  The type of exercise that you do should increase your heart rate and make you sweat. This is known as moderate-intensity exercise. ? Try to do strengthening exercises at least twice each week. Do these in addition to the moderate-intensity exercise.  Know your numbers.Ask your health care provider to check your cholesterol and your blood glucose. Continue to have your blood tested as directed by your health care provider.  What should I know about cancer screening? There are several types of cancer. Take the following steps to reduce your risk and to catch any cancer development as early as possible. Breast Cancer  Practice breast self-awareness. ? This means understanding how your breasts normally appear and feel. ? It also means doing regular breast self-exams. Let your health care provider know about any changes, no matter how small.  If you are 31 or older, have a clinician do a breast exam (clinical breast exam or CBE) every year. Depending on your age, family history, and medical history, it may be recommended that you also have a yearly breast X-ray (mammogram).  If you have a family history of breast cancer, talk with your health care provider about genetic screening.  If you are at high risk for breast cancer, talk with your health care provider about having an MRI and a mammogram every year.  Breast cancer (BRCA) gene test is recommended for women who have family members with BRCA-related cancers. Results of the assessment will determine the need for genetic counseling and BRCA1 and for BRCA2 testing. BRCA-related cancers include these types: ? Breast. This occurs in males or females. ? Ovarian. ? Tubal. This may also be called fallopian tube cancer. ? Cancer of the abdominal or pelvic lining (peritoneal cancer). ? Prostate. ? Pancreatic.  Cervical, Uterine, and Ovarian Cancer Your health care provider may recommend that you be screened regularly for cancer of the pelvic  organs. These include your ovaries, uterus, and vagina. This screening involves a pelvic exam, which includes checking for microscopic changes to the surface of your cervix (Pap test).  For women ages 21-65, health care providers may recommend a pelvic exam and a Pap test every three years. For women ages 67-65, they may recommend the Pap test and pelvic exam, combined with testing for human papilloma virus (HPV), every five years. Some types of HPV increase your risk of cervical cancer. Testing for HPV may also be done on women of any age who have unclear Pap test results.  Other health care providers may not recommend any screening for nonpregnant women who are considered low risk for pelvic cancer and have no symptoms. Ask your health care provider if a screening pelvic exam is right for you.  If you have had past treatment for cervical cancer or a condition that could lead to cancer, you need Pap tests and screening for cancer for at least 20 years after your treatment. If Pap tests have been discontinued for you, your risk factors (such as having a new sexual partner) need to be reassessed to determine if you should start having screenings again. Some women have medical problems that increase the chance of getting cervical cancer. In these  cases, your health care provider may recommend that you have screening and Pap tests more often.  If you have a family history of uterine cancer or ovarian cancer, talk with your health care provider about genetic screening.  If you have vaginal bleeding after reaching menopause, tell your health care provider.  There are currently no reliable tests available to screen for ovarian cancer.  Lung Cancer Lung cancer screening is recommended for adults 46-30 years old who are at high risk for lung cancer because of a history of smoking. A yearly low-dose CT scan of the lungs is recommended if you:  Currently smoke.  Have a history of at least 30 pack-years of  smoking and you currently smoke or have quit within the past 15 years. A pack-year is smoking an average of one pack of cigarettes per day for one year.  Yearly screening should:  Continue until it has been 15 years since you quit.  Stop if you develop a health problem that would prevent you from having lung cancer treatment.  Colorectal Cancer  This type of cancer can be detected and can often be prevented.  Routine colorectal cancer screening usually begins at age 46 and continues through age 24.  If you have risk factors for colon cancer, your health care provider may recommend that you be screened at an earlier age.  If you have a family history of colorectal cancer, talk with your health care provider about genetic screening.  Your health care provider may also recommend using home test kits to check for hidden blood in your stool.  A small camera at the end of a tube can be used to examine your colon directly (sigmoidoscopy or colonoscopy). This is done to check for the earliest forms of colorectal cancer.  Direct examination of the colon should be repeated every 5-10 years until age 68. However, if early forms of precancerous polyps or small growths are found or if you have a family history or genetic risk for colorectal cancer, you may need to be screened more often.  Skin Cancer  Check your skin from head to toe regularly.  Monitor any moles. Be sure to tell your health care provider: ? About any new moles or changes in moles, especially if there is a change in a mole's shape or color. ? If you have a mole that is larger than the size of a pencil eraser.  If any of your family members has a history of skin cancer, especially at a young age, talk with your health care provider about genetic screening.  Always use sunscreen. Apply sunscreen liberally and repeatedly throughout the day.  Whenever you are outside, protect yourself by wearing long sleeves, pants, a wide-brimmed  hat, and sunglasses.  What should I know about osteoporosis? Osteoporosis is a condition in which bone destruction happens more quickly than new bone creation. After menopause, you may be at an increased risk for osteoporosis. To help prevent osteoporosis or the bone fractures that can happen because of osteoporosis, the following is recommended:  If you are 58-12 years old, get at least 1,000 mg of calcium and at least 600 mg of vitamin D per day.  If you are older than age 18 but younger than age 57, get at least 1,200 mg of calcium and at least 600 mg of vitamin D per day.  If you are older than age 22, get at least 1,200 mg of calcium and at least 800 mg of vitamin D per  day.  Smoking and excessive alcohol intake increase the risk of osteoporosis. Eat foods that are rich in calcium and vitamin D, and do weight-bearing exercises several times each week as directed by your health care provider. What should I know about how menopause affects my mental health? Depression may occur at any age, but it is more common as you become older. Common symptoms of depression include:  Low or sad mood.  Changes in sleep patterns.  Changes in appetite or eating patterns.  Feeling an overall lack of motivation or enjoyment of activities that you previously enjoyed.  Frequent crying spells.  Talk with your health care provider if you think that you are experiencing depression. What should I know about immunizations? It is important that you get and maintain your immunizations. These include:  Tetanus, diphtheria, and pertussis (Tdap) booster vaccine.  Influenza every year before the flu season begins.  Pneumonia vaccine.  Shingles vaccine.  Your health care provider may also recommend other immunizations. This information is not intended to replace advice given to you by your health care provider. Make sure you discuss any questions you have with your health care provider. Document Released:  08/07/2005 Document Revised: 01/03/2016 Document Reviewed: 03/19/2015 Elsevier Interactive Patient Education  2018 [employer]  

## 2017-12-24 ENCOUNTER — Encounter: Payer: Self-pay | Admitting: Family Medicine

## 2018-02-22 ENCOUNTER — Emergency Department (HOSPITAL_BASED_OUTPATIENT_CLINIC_OR_DEPARTMENT_OTHER): Payer: Medicare HMO

## 2018-02-22 ENCOUNTER — Other Ambulatory Visit: Payer: Self-pay

## 2018-02-22 ENCOUNTER — Encounter (HOSPITAL_BASED_OUTPATIENT_CLINIC_OR_DEPARTMENT_OTHER): Payer: Self-pay | Admitting: *Deleted

## 2018-02-22 ENCOUNTER — Emergency Department (HOSPITAL_BASED_OUTPATIENT_CLINIC_OR_DEPARTMENT_OTHER)
Admission: EM | Admit: 2018-02-22 | Discharge: 2018-02-22 | Disposition: A | Discharge status: Home / Self Care | Payer: Medicare HMO | Attending: Emergency Medicine | Admitting: Emergency Medicine

## 2018-02-22 ENCOUNTER — Encounter: Payer: Self-pay | Admitting: Medical

## 2018-02-22 ENCOUNTER — Telehealth: Payer: Self-pay | Admitting: Family Medicine

## 2018-02-22 ENCOUNTER — Ambulatory Visit (INDEPENDENT_AMBULATORY_CARE_PROVIDER_SITE_OTHER): Payer: Medicare HMO | Admitting: Medical

## 2018-02-22 VITALS — BP 167/88 | HR 73 | Temp 97.8°F | Resp 16 | Ht 61.0 in | Wt 138.0 lb

## 2018-02-22 DIAGNOSIS — I6523 Occlusion and stenosis of bilateral carotid arteries: Secondary | ICD-10-CM

## 2018-02-22 DIAGNOSIS — R072 Precordial pain: Secondary | ICD-10-CM | POA: Diagnosis not present

## 2018-02-22 DIAGNOSIS — R0789 Other chest pain: Secondary | ICD-10-CM

## 2018-02-22 DIAGNOSIS — R079 Chest pain, unspecified: Secondary | ICD-10-CM

## 2018-02-22 DIAGNOSIS — Z79899 Other long term (current) drug therapy: Secondary | ICD-10-CM | POA: Diagnosis not present

## 2018-02-22 DIAGNOSIS — R002 Palpitations: Secondary | ICD-10-CM | POA: Diagnosis not present

## 2018-02-22 DIAGNOSIS — Z7982 Long term (current) use of aspirin: Secondary | ICD-10-CM | POA: Insufficient documentation

## 2018-02-22 DIAGNOSIS — I10 Essential (primary) hypertension: Secondary | ICD-10-CM

## 2018-02-22 LAB — CBC
HEMATOCRIT: 37.6 % (ref 36.0–46.0)
Hemoglobin: 12.9 g/dL (ref 12.0–15.0)
MCH: 31.6 pg (ref 26.0–34.0)
MCHC: 34.3 g/dL (ref 30.0–36.0)
MCV: 92.2 fL (ref 78.0–100.0)
Platelets: 167 10*3/uL (ref 150–400)
RBC: 4.08 MIL/uL (ref 3.87–5.11)
RDW: 13.4 % (ref 11.5–15.5)
WBC: 8.3 10*3/uL (ref 4.0–10.5)

## 2018-02-22 LAB — BASIC METABOLIC PANEL
Anion gap: 10 (ref 5–15)
BUN: 11 mg/dL (ref 8–23)
CO2: 25 mmol/L (ref 22–32)
Calcium: 9.1 mg/dL (ref 8.9–10.3)
Chloride: 102 mmol/L (ref 98–111)
Creatinine, Ser: 0.78 mg/dL (ref 0.44–1.00)
GFR calc Af Amer: >60 mL/min (ref >60)
Glucose, Bld: 109 mg/dL — ABNORMAL HIGH (ref 70–99)
POTASSIUM: 4.2 mmol/L (ref 3.5–5.1)
SODIUM: 137 mmol/L (ref 135–145)

## 2018-02-22 LAB — TROPONIN I: Troponin I: <0.03 ng/mL (ref <0.03)

## 2018-02-22 MED ORDER — NITROGLYCERIN 0.4 MG SL SUBL: 0.4000 mg | SUBLINGUAL_TABLET | SUBLINGUAL | Status: DC | PRN

## 2018-02-22 NOTE — Discharge Instructions (Addendum)
Please return to ED if chest pain worsens.

## 2018-02-22 NOTE — ED Triage Notes (Signed)
Irregular heartbeat for 2 months. Last night she had indigestion and this am her BP and heart rate were high. She has a heaviness in her chest.

## 2018-02-22 NOTE — Progress Notes (Signed)
Subjective:    Patient ID: Sophia Sophia [name], female    DOB: May 19, 1941, 77 y.o.   MRN: [id number]  HPI   Pt in with heavy chest sensation. Last night she had some palpitation. This morning her bp was elevated and her pulse was mild elevated at 105.  Pt currently still has pressure in chest and still have subjective palpitations.   Pt does have history of palpitations in the past.   She states felt some mild chest pain at night and mild pressure. Worse sensation was palpitation sensation.   Pt does have history of carotid stenosis.  Also pulsating sensation to Sophia [name] ear in past with palpitating sensation.  Pt had holter monitor in [place] about 7 years ago.   Pt mom did have MI in 60's. No history of smoking. But her husband did smoke. Hyperlipidemia.    Review of Systems  Constitutional: Positive for fatigue. Negative for chills and fever.  HENT: Negative for congestion, sinus pressure and sinus pain.        Faint ear region discomfort.  Respiratory: Negative for chest tightness and shortness of breath.   Cardiovascular: Positive for chest pain and palpitations.  Gastrointestinal: Negative for abdominal distention, abdominal pain and anal bleeding.  Musculoskeletal: Negative for back pain, myalgias and neck pain.  Neurological: Positive for dizziness. Negative for speech difficulty, weakness, numbness and headaches.  Hematological: Negative for adenopathy. Does not bruise/bleed easily.  Psychiatric/Behavioral: Negative for behavioral problems, confusion, hallucinations and sleep disturbance. The patient is not nervous/anxious and is not hyperactive.    Past Medical History:  Diagnosis Date  . Anxiety and depression   . Arthritis    hands, knees, etc  . Bladder prolapse, female, acquired 09/16/2014  . Bowen's disease   . Diverticulitis   . Diverticulosis   . GERD (gastroesophageal reflux disease)   . H/O hematuria    for several years, work only revealed kidney stone  on right side  . History of chicken pox   . History of kidney stones   . Hyperlipidemia   . Hypertension   . Leg cramps, sleep related 04/02/2016  . Myalgia 04/25/2017  . Osteopenia 06/26/2014  . Osteoporosis 06/26/2014  . Plantar wart of Sophia [name] foot 10/15/2016  . Right shoulder pain 04/23/2017  . SCC (squamous cell carcinoma)    arms     Social History   Socioeconomic History  . Marital status: Single    Spouse name: Not on file  . Number of children: Not on file  . Years of education: Not on file  . Highest education level: Not on file  Occupational History  . Not on file  Social Needs  . Financial resource strain: Not on file  . Food insecurity:    Worry: Not on file    Inability: Not on file  . Transportation needs:    Medical: Not on file    Non-medical: Not on file  Tobacco Use  . Smoking status: Never Smoker  . Smokeless tobacco: Never Used  Substance and Sexual Activity  . Alcohol use: No    Alcohol/week: 0.0 standard drinks  . Drug use: No  . Sexual activity: Never    Comment: divorced, widowed, lives with elderly parents, is the caregiver, no dietary restrictions.   Lifestyle  . Physical activity:    Days per week: Not on file    Minutes per session: Not on file  . Stress: Not on file  Relationships  . Social connections:  Talks on phone: Not on file    Gets together: Not on file    Attends religious service: Not on file    Active member of club or organization: Not on file    Attends meetings of clubs or organizations: Not on file    Relationship status: Not on file  . Intimate partner violence:    Fear of current or ex partner: Not on file    Emotionally abused: Not on file    Physically abused: Not on file    Forced sexual activity: Not on file  Other Topics Concern  . Not on file  Social History Narrative  . Not on file    Past Surgical History:  Procedure Laterality Date  . CHOLECYSTECTOMY  2005  . SKIN BIOPSY     multiple     Family History  Problem Relation Age of Onset  . Heart disease Mother        aortic stenosis  . Hyperlipidemia Mother   . Hypertension Mother   . Heart attack Mother   . Cancer Mother        BCC, SCC, skin  . Hypertension Father   . Cancer Father        SCC, BCC, skin  . Early death Daughter   . Heart disease Maternal Grandfather        MI  . Tuberculosis Paternal Grandmother   . Cancer Paternal Grandfather        head and neck cancer, chewed tobacco  . Hypertension Daughter   . Hypertension Brother   . Hyperlipidemia Brother   . Birth defects Maternal Uncle     Allergies  Allergen Reactions  . Morphine And Related Swelling  . Statins Other (See Comments)    Myalgias, weakness  . Sulfa Antibiotics Swelling and Rash    No current facility-administered medications on file prior to visit.    Current Outpatient Medications on File Prior to Visit  Medication Sig Dispense Refill  . ALPRAZolam (XANAX) 0.25 MG tablet Take 1 tablet (0.25 mg total) by mouth daily as needed for anxiety or sleep. 30 tablet 1  . aspirin 81 MG tablet Take 81 mg by mouth 3 (three) times a week.    . B Complex-C (SUPER B COMPLEX) TABS Take 1 tablet by mouth daily.    . carvedilol (COREG) 6.25 MG tablet Take 1 tablet (6.25 mg total) by mouth 2 (two) times daily. 180 tablet 1  . Coenzyme Q10 (COQ-10) 200 MG CAPS Take 1 capsule by mouth daily.    . Omega-3 Fatty Acids (FISH OIL) 1200 MG CAPS Take 1 capsule by mouth 2 (two) times daily.    [name] OVER THE COUNTER MEDICATION Citrical with Vit D3 630mg /500IU-Take 1 capsule by mouth daily.    . Probiotic Product (PROBIOTIC DAILY PO) Take by mouth daily.      BP (!) 167/88   Pulse 73   Temp 97.8 F (36.6 C) (Oral)   Resp 16   Ht 5\' 1"  (1.549 m)   Wt 138 lb (62.6 kg)   SpO2 100%   BMI 26.07 kg/m       Objective:   Physical Exam  General Mental Status- Alert. General Appearance- Not in acute distress.   Skin General: Color- Normal Color.  Moisture- Normal Moisture.  Neck Carotid Arteries- Normal color. Moisture- Normal Moisture. No carotid bruits. No JVD.  Chest and Lung Exam Auscultation: Breath Sounds:-Normal.  Cardiovascular Auscultation:Rythm- Regular. Murmurs & Other Heart Sounds:Auscultation of the heart  reveals- No Murmurs.  Abdomen Inspection:-Inspeection Normal. Palpation/Percussion:Note:No mass. Palpation and Percussion of the abdomen reveal- Non Tender, Non Distended + BS, no rebound or guarding.    Neurologic Cranial Nerve exam:- CN III-XII intact(No nystagmus), symmetric smile. Strength:- 5/5 equal and symmetric strength both upper and lower extremities.      Assessment & Plan:  For your recent chest pressure, palpitations high blood pressure and known carotid stenosis, I do think it is best to be evaluated in the emergency department now.  I did talk with the emergency department physician and notified him of your recent presentation.  Please go downstairs and notify receptionist/triage staff that ED and be aware of your recent chest pressure.  Follow-up with [place] after emergency department work-up.  Mackie Pai, PA-C

## 2018-02-22 NOTE — ED Notes (Signed)
Pt states that everyone has a hard time getting blood.  Family member at bedside questioning the need for IV access, questioning orders.  Offered to have MD return to the room but both conceded for IV attempt.  Upon looking, no obvious possibilities for IV.  Asked another RN to look for IV access.  She was successful for IV but unable to pull off blood.  Md notified that we would attempt blood draw again.

## 2018-02-22 NOTE — ED Notes (Signed)
Patient transported to X-ray 

## 2018-02-22 NOTE — Patient Instructions (Signed)
For your recent chest pressure, palpitations high blood pressure and known carotid stenosis, I do think it is best to be evaluated in the emergency department now.  I did talk with the emergency department physician and notified him of your recent presentation.  Please go downstairs and notify receptionist/triage staff that ED and be aware of your recent chest pressure.  Follow-up with [place] after emergency department work-up.

## 2018-02-22 NOTE — ED Notes (Signed)
Dr. Ronnald Nian stuck patient with ultrasound guided IV start.  Blood was obtained and sent to lab.

## 2018-02-22 NOTE — Telephone Encounter (Signed)
I did talk with patient today around 6 PM.  She acknowledged that she signed out [place] earlier today.  She did express understanding on why the emergency department physician wanted her to get a stress test today.  Currently she states that she feels stable and did admit minimal faint chest pressure.  She states that she is fully aware that if her symptoms worsen that she needs to return to the emergency department.  Her daughter did go ahead and schedule appointment with Dr. Alvester Chou cardiologist this Friday.  I informed her that I did go ahead and also make referral to vascular surgeon for her carotid stenosis.  Earlier did place cardiology referral as well but was unaware that she already had appointment set for this Friday.

## 2018-02-22 NOTE — Progress Notes (Signed)
ekg 

## 2018-02-22 NOTE — ED Provider Notes (Signed)
[facility] EMERGENCY DEPARTMENT Provider Note   CSN: [id number] Arrival date & time: 02/22/18  1202     History   Chief Complaint Chief Complaint  Patient presents with  . Chest Pain    HPI Sophia [name] is a 77 y.o. female.  The history is provided by the patient.  Chest Pain   This is a new problem. The current episode started 6 to 12 hours ago. The problem occurs constantly. The problem has not changed since onset.The pain is associated with exertion. The pain is present in the substernal region. The pain is at a severity of 5/10. The pain is moderate. The quality of the pain is described as pressure-like. The symptoms are aggravated by exertion. Associated symptoms include palpitations. Pertinent negatives include no abdominal pain, no back pain, no cough, no diaphoresis, no fever, no irregular heartbeat, no lower extremity edema, no nausea, no numbness, no orthopnea, no shortness of breath, no sputum production, no syncope and no vomiting. She has tried antacids for the symptoms. The treatment provided no relief. Risk factors include being elderly.  Her past medical history is significant for hyperlipidemia and hypertension.  Pertinent negatives for past medical history include no seizures.  Her family medical history is significant for CAD.    Past Medical History:  Diagnosis Date  . Anxiety and depression   . Arthritis    hands, knees, etc  . Bladder prolapse, female, acquired 09/16/2014  . Bowen's disease   . Diverticulitis   . Diverticulosis   . GERD (gastroesophageal reflux disease)   . H/O hematuria    for several years, work only revealed kidney stone on right side  . History of chicken pox   . History of kidney stones   . Hyperlipidemia   . Hypertension   . Leg cramps, sleep related 04/02/2016  . Myalgia 04/25/2017  . Osteopenia 06/26/2014  . Osteoporosis 06/26/2014  . Plantar wart of left foot 10/15/2016  . Right shoulder pain 04/23/2017  .  SCC (squamous cell carcinoma)    arms    Patient Active Problem List   Diagnosis Date Noted  . Myalgia 04/25/2017  . Right shoulder pain 04/23/2017  . Plantar wart of left foot 10/15/2016  . Leg cramps, sleep related 04/02/2016  . Hip pain 05/29/2015  . Preventative health care 03/24/2015  . Bladder prolapse, female, acquired 09/16/2014  . Osteoporosis 06/26/2014  . Arthritis   . GERD (gastroesophageal reflux disease)   . Hypertension   . Hyperlipidemia   . History of kidney stones   . H/O hematuria   . Diverticulosis   . History of chicken pox   . Anxiety and depression   . SCC (squamous cell carcinoma)   . Bowen's disease     Past Surgical History:  Procedure Laterality Date  . CHOLECYSTECTOMY  2005  . SKIN BIOPSY     multiple     OB History   None      Home Medications    Prior to Admission medications   Medication Sig Start Date End Date Taking? Authorizing Provider  ALPRAZolam (XANAX) 0.25 MG tablet Take 1 tablet (0.25 mg total) by mouth daily as needed for anxiety or sleep. 04/23/17   Mosie Lukes, MD  aspirin 81 MG tablet Take 81 mg by mouth 3 (three) times a week.    [provider]  B Complex-C (SUPER B COMPLEX) TABS Take 1 tablet by mouth daily.    [provider]  carvedilol (  COREG) 6.25 MG tablet Take 1 tablet (6.25 mg total) by mouth 2 (two) times daily. 10/12/17   Mosie Lukes, MD  Coenzyme Q10 (COQ-10) 200 MG CAPS Take 1 capsule by mouth daily.    [provider]  Omega-3 Fatty Acids (FISH OIL) 1200 MG CAPS Take 1 capsule by mouth 2 (two) times daily.    [provider]  OVER THE COUNTER MEDICATION Citrical with Vit D3 630mg /500IU-Take 1 capsule by mouth daily.    [provider]  Probiotic Product (PROBIOTIC DAILY PO) Take by mouth daily.    [provider]    Family History Family History  Problem Relation Age of Onset  . Heart disease Mother        aortic stenosis  . Hyperlipidemia  Mother   . Hypertension Mother   . Heart attack Mother   . Cancer Mother        BCC, SCC, skin  . Hypertension Father   . Cancer Father        SCC, BCC, skin  . Early death Daughter   . Heart disease Maternal Grandfather        MI  . Tuberculosis Paternal Grandmother   . Cancer Paternal Grandfather        head and neck cancer, chewed tobacco  . Hypertension Daughter   . Hypertension Brother   . Hyperlipidemia Brother   . Birth defects Maternal Uncle     Social History Social History   Tobacco Use  . Smoking status: Never Smoker  . Smokeless tobacco: Never Used  Substance Use Topics  . Alcohol use: No    Alcohol/week: 0.0 standard drinks  . Drug use: No     Allergies   Morphine and related; Statins; and Sulfa antibiotics   Review of Systems Review of Systems  Constitutional: Negative for chills, diaphoresis and fever.  HENT: Negative for ear pain and sore throat.   Eyes: Negative for pain and visual disturbance.  Respiratory: Negative for cough, sputum production and shortness of breath.   Cardiovascular: Positive for chest pain and palpitations. Negative for orthopnea and syncope.  Gastrointestinal: Negative for abdominal pain, nausea and vomiting.  Genitourinary: Negative for dysuria and hematuria.  Musculoskeletal: Negative for arthralgias and back pain.  Skin: Negative for color change and rash.  Neurological: Negative for seizures, syncope and numbness.  All other systems reviewed and are negative.    Physical Exam Updated Vital Signs  ED Triage Vitals  Enc Vitals Group     BP --      Pulse --      Resp --      Temp --      Temp src --      SpO2 --      Weight 02/22/18 1206 137 lb 12.6 oz (62.5 kg)     Height 02/22/18 1206 5\' 1"  (1.549 m)     Head Circumference --      Peak Flow --      Pain Score 02/22/18 1205 3     Pain Loc --      Pain Edu? --      Excl. in [place]? --     Physical Exam  Constitutional: She appears well-developed and  well-nourished. No distress.  HENT:  Head: Normocephalic and atraumatic.  Eyes: Pupils are equal, round, and reactive to light. Conjunctivae and EOM are normal.  Neck: Normal range of motion. Neck supple.  Cardiovascular: Normal rate, regular rhythm, intact distal pulses and normal  pulses.  No murmur heard. Pulmonary/Chest: Effort normal and breath sounds normal. No respiratory distress. She has no decreased breath sounds. She has no wheezes.  Abdominal: Soft. There is no tenderness.  Musculoskeletal: She exhibits no edema.       Right lower leg: She exhibits no edema.       Left lower leg: She exhibits no edema.  Neurological: She is alert.  Skin: Skin is warm and dry. Capillary refill takes less than 2 seconds.  Psychiatric: She has a normal mood and affect.  Nursing note and vitals reviewed.    ED Treatments / Results  Labs (all labs ordered are listed, but only abnormal results are displayed) Labs Reviewed  BASIC METABOLIC PANEL - Abnormal; Notable for the following components:      Result Value   Glucose, Bld 109 (*)    All other components within normal limits  CBC  TROPONIN I    EKG EKG Interpretation  Date/Time:  Tuesday February 22 2018 12:15:22 EDT Ventricular Rate:  73 PR Interval:    QRS Duration: 85 QT Interval:  388 QTC Calculation: 428 R Axis:   8 Text Interpretation:  Sinus rhythm Consider left atrial enlargement Borderline T abnormalities, inferior leads Confirmed by Lennice Sites [id number]) on 02/22/2018 12:29:49 PM   Radiology Dg Chest 2 View  Result Date: 02/22/2018 CLINICAL DATA:  Chest heaviness beginning today EXAM: CHEST - 2 VIEW COMPARISON:  None. FINDINGS: Artifact overlies the chest. Heart size is normal. Mediastinal shadows are normal. Lungs are clear. No effusions. The vascularity is normal. No significant bone finding. IMPRESSION: No active cardiopulmonary disease. Electronically Signed   By: Nelson Chimes M.D.   On: 02/22/2018 13:31     Procedures Procedures (including critical care time)  Medications Ordered in ED Medications  nitroGLYCERIN (NITROSTAT) SL tablet 0.4 mg (has no administration in time range)     Initial Impression / Assessment and Plan / ED Course  I have reviewed the triage vital signs and the nursing notes.  Pertinent labs & imaging results that were available during my care of the patient were reviewed by me and considered in my medical decision making (see chart for details).     Sophia [name] is a 77 year old female with history of high cholesterol, hypertension who presents to the ED with chest pain, palpitations.  Patient with hypertension upon arrival but otherwise normal vitals.  Patient had EKG upon arrival that showed sinus rhythm with no signs of ischemic changes.  Patient with chest pain, palpitations, indigestion that started in the middle the night about 12 hours ago.  Patient took antacid without much relief.  Patient went to her primary care doctor today and he sent her for further chest pain evaluation.  Patient states that she took 324 mg aspirin prior to her primary care doctor's office today.  Patient has no history of heart disease and had a negative stress test 7 years ago in [place].  She has history of arrhythmia and is on Coreg.  Sometimes that she sits around she can feel palpitations and was feeling them last night but she thought she was having some chest pressure along with them.  It appeared to get worse with some mild exertion.  However patient without any chest pain at rest currently.  Patient denies any fever, chills, cough, sputum production.  Patient has here score of 4 given her risk factors and story.  I  Initial troponin was within normal limits.  Chest x-ray showed no  signs of pneumonia, pneumothorax, pleural effusion.  Patient had no significant anemia, electrolyte abnormality, kidney injury.  Upon reevaluation the patient she is currently chest pain-free.  I had  long discussion with the patient and her daughter about admission for further chest pain rule out given her risk factors and her story but patient decided to leave [place].  Patient would not like to stay for delta troponin as well after further discussion.  Patient has capacity to make this decision.  She understands the risks of having a heart attack, death.  Shared decision was made and patient given strict return precautions.  Patient given information to establish care with cardiology.  Patient had no DVT or PE risk factors and his Wells criteria 0 and no concern for PE.  Believe patient would benefit from stress test and repeat troponins and told patient to return to the ED if change her mind.  Patient discharged in good condition after refusing further care.   This chart was dictated using voice recognition software.  Despite best efforts to proofread,  errors can occur which can change the documentation meaning.   Final Clinical Impressions(s) / ED Diagnoses   Final diagnoses:  Chest pain, unspecified type    ED Discharge Orders    None       Lennice Sites, DO 02/22/18 1530

## 2018-02-22 NOTE — ED Notes (Signed)
Unsuccessful stick x 1. Primary RN made aware. Heat packs applied to pt hands.

## 2018-02-22 NOTE — Telephone Encounter (Signed)
Patient was seen this morning with Mackie Pai PA. Advised to go downstairs to ED for palpitations. Patient's daughter Concha Se states that she would like further advise on if meds need to be adjusted. Patient is still taking the Cavedilol 1 tab 2x daily that helps with the elevated palp. Daughter states that pt is stable & calm at this moment but is not pleased with the service received at the ED. Patient is waiting on lab results to come back. & will go from there... Daughter states that ED is asking to transport her to [facility] & preform stress test. Patient is feeling fine at this time & would like to have a referral placed for cardiology. Patient requesting Dr. Jonathan Martinique [facility] - cardiovascular disease [address] [place], [place], [place] [address] [phone]. Advised patient to call if anything else happens & get triaged. [place] in [place] as well. Can someone please follow up with patient. Please let us know if we should schedule any follow up visits.

## 2018-02-23 ENCOUNTER — Other Ambulatory Visit: Payer: Self-pay

## 2018-02-23 DIAGNOSIS — I6523 Occlusion and stenosis of bilateral carotid arteries: Secondary | ICD-10-CM

## 2018-02-25 ENCOUNTER — Encounter: Payer: Self-pay | Admitting: Cardiovascular Disease

## 2018-02-25 ENCOUNTER — Telehealth: Payer: Self-pay

## 2018-02-25 ENCOUNTER — Ambulatory Visit (INDEPENDENT_AMBULATORY_CARE_PROVIDER_SITE_OTHER): Payer: Medicare HMO

## 2018-02-25 ENCOUNTER — Ambulatory Visit: Payer: Medicare HMO | Admitting: Cardiovascular Disease

## 2018-02-25 VITALS — BP 130/80 | HR 81 | Ht 61.0 in | Wt 139.0 lb

## 2018-02-25 DIAGNOSIS — R002 Palpitations: Secondary | ICD-10-CM

## 2018-02-25 DIAGNOSIS — R0989 Other specified symptoms and signs involving the circulatory and respiratory systems: Secondary | ICD-10-CM

## 2018-02-25 DIAGNOSIS — E7800 Pure hypercholesterolemia, unspecified: Secondary | ICD-10-CM

## 2018-02-25 DIAGNOSIS — R079 Chest pain, unspecified: Secondary | ICD-10-CM | POA: Insufficient documentation

## 2018-02-25 DIAGNOSIS — I200 Unstable angina: Secondary | ICD-10-CM

## 2018-02-25 NOTE — Assessment & Plan Note (Signed)
Bilateral carotid bruits on exam.  We will recheck carotid Doppler studies.

## 2018-02-25 NOTE — Progress Notes (Signed)
02/25/2018 Sophia [name]   03-04-1941  [id number]  Primary Physician Mosie Lukes, MD Primary Cardiologist: Lorretta Harp MD Garret Reddish, [place], [place]  HPI:  Sophia [name] is a 77 y.o. mild to moderately overweight divorced Caucasian female mother of 3 children, grandmother of 5 grandchildren who is accompanied by 2 of her daughters today, Angus Palms and [name] ([name] is a patient of mine).  She was referred by the ER for evaluation of chest pain.  Her primary care provider is Dr. [name] .  Risk factors include hyperlipidemia intolerant to statin therapy and family history with mother who had a myocardial infarction at age 6 as well as extensive vascular disease.  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  She is never had a heart attack or stroke.  She has had palpitations evaluated by cardiologist in [place] 7 years ago which time a work-up included stress testing and event monitoring.  She was placed on carvedilol at that time.  She was seen in the emergency room at [facility] on 02/22/2018 with chest pain and palpitations.  Pain began in the early morning hours and got worse.  She thought it was indigestion.  She did feel increasing palpitations as well as shortness of breath.  She says she can also hear her "heartbeat" in her left ear.  Her troponins were negative and her EKG showed no acute changes at that time.   Current Meds  Medication Sig  . ALPRAZolam (XANAX) 0.25 MG tablet Take 1 tablet (0.25 mg total) by mouth daily as needed for anxiety or sleep.  [name] aspirin 81 MG tablet Take 81 mg by mouth 3 (three) times a week.  . B Complex-C (SUPER B COMPLEX) TABS Take 1 tablet by mouth daily.  . carvedilol (COREG) 6.25 MG tablet Take 1 tablet (6.25 mg total) by mouth 2 (two) times daily.  . Coenzyme Q10 (COQ-10) 200 MG CAPS Take 1 capsule by mouth daily.  [name] estradiol (ESTRACE) 0.1 MG/GM vaginal cream Place 1 Applicatorful  vaginally 2 (two) times a week.  . Omega-3 Fatty Acids (FISH OIL) 1200 MG CAPS Take 1 capsule by mouth 2 (two) times daily.  [name] OVER THE COUNTER MEDICATION Citrical with Vit D3 630mg /500IU-Take 1 capsule by mouth daily.  . Probiotic Product (PROBIOTIC DAILY PO) Take by mouth daily.  . vitamin C (ASCORBIC ACID) 500 MG tablet Take 500 mg by mouth daily.     Allergies  Allergen Reactions  . Morphine And Related Swelling  . Statins Other (See Comments)    Myalgias, weakness  . Sulfa Antibiotics Swelling and Rash    Social History   Socioeconomic History  . Marital status: Single    Spouse name: Not on file  . Number of children: Not on file  . Years of education: Not on file  . Highest education level: Not on file  Occupational History  . Not on file  Social Needs  . Financial resource strain: Not on file  . Food insecurity:    Worry: Not on file    Inability: Not on file  . Transportation needs:    Medical: Not on file    Non-medical: Not on file  Tobacco Use  . Smoking status: Never Smoker  . Smokeless tobacco: Never Used  Substance and Sexual Activity  . Alcohol use: No    Alcohol/week: 0.0 standard drinks  . Drug use: No  . Sexual  activity: Never    Comment: divorced, widowed, lives with elderly parents, is the caregiver, no dietary restrictions.   Lifestyle  . Physical activity:    Days per week: Not on file    Minutes per session: Not on file  . Stress: Not on file  Relationships  . Social connections:    Talks on phone: Not on file    Gets together: Not on file    Attends religious service: Not on file    Active member of club or organization: Not on file    Attends meetings of clubs or organizations: Not on file    Relationship status: Not on file  . Intimate partner violence:    Fear of current or ex partner: Not on file    Emotionally abused: Not on file    Physically abused: Not on file    Forced sexual activity: Not on file  Other Topics Concern  .  Not on file  Social History Narrative  . Not on file     Review of Systems: General: negative for chills, fever, night sweats or weight changes.  Cardiovascular: negative for chest pain, dyspnea on exertion, edema, orthopnea, palpitations, paroxysmal nocturnal dyspnea or shortness of breath Dermatological: negative for rash Respiratory: negative for cough or wheezing Urologic: negative for hematuria Abdominal: negative for nausea, vomiting, diarrhea, bright red blood per rectum, melena, or hematemesis Neurologic: negative for visual changes, syncope, or dizziness All other systems reviewed and are otherwise negative except as noted above.    Blood pressure 130/80, pulse 81, height 5\' 1"  (1.549 m), weight 139 lb (63 kg).  General appearance: alert and no distress Neck: no adenopathy, no JVD, supple, symmetrical, trachea midline, thyroid not enlarged, symmetric, no tenderness/mass/nodules and Bilateral carotid bruits Lungs: clear to auscultation bilaterally Heart: regular rate and rhythm, S1, S2 normal, no murmur, click, rub or gallop Extremities: extremities normal, atraumatic, no cyanosis or edema Pulses: 2+ and symmetric Skin: Skin color, texture, turgor normal. No rashes or lesions Neurologic: Alert and oriented X 3, normal strength and tone. Normal symmetric reflexes. Normal coordination and gait  EKG normal sinus rhythm at 71 with low limb voltage nonspecific ST and T wave changes.  I personally reviewed this EKG.  ASSESSMENT AND PLAN:   Hyperlipidemia History of hyperlipidemia intolerant to statin drugs  Palpitations History of palpitations evaluated by cardiologist in [place] 7 years ago.  She had an event monitor back then as well as stress test.  She was placed on carvedilol some improvement.  Her symptoms have gotten worse recently.  Going to obtain a 2-week event monitor to further evaluate.  Chest pain Ms. Cregg was seen in the ER admitted at [facility] on 02/22/2018  for chest pain.  The pain began in the early morning hours.  She thought was indigestion.  She had increasing palpitations as well as fatigue.  Her troponins were negative and her EKG showed no acute changes.  She actually left prior to being fully evaluated.  Over the last several days she has had some mild chest pressure.  I am getting a 2D echo and exercise Myoview to further evaluate.  Bilateral carotid bruits Bilateral carotid bruits on exam.  We will recheck carotid Doppler studies.      Lorretta Harp MD FACP,FACC,FAHA, [facility] 02/25/2018 10:18 AM

## 2018-02-25 NOTE — Assessment & Plan Note (Signed)
Sophia [name] was seen in the ER admitted at [facility] on 02/22/2018 for chest pain.  The pain began in the early morning hours.  She thought was indigestion.  She had increasing palpitations as well as fatigue.  Her troponins were negative and her EKG showed no acute changes.  She actually left prior to being fully evaluated.  Over the last several days she has had some mild chest pressure.  I am getting a 2D echo and exercise Myoview to further evaluate.

## 2018-02-25 NOTE — Patient Instructions (Signed)
Medication Instructions:  Your physician recommends that you continue on your current medications as directed. Please refer to the Current Medication list given to you today.   Labwork: NONE  Testing/Procedures: Your physician has recommended that you wear an 2 week event monitor. Event monitors are medical devices that record the heart's electrical activity. Doctors most often [place] these monitors to diagnose arrhythmias. Arrhythmias are problems with the speed or rhythm of the heartbeat. The monitor is a small, portable device. You can wear one while you do your normal daily activities. This is usually used to diagnose what is causing palpitations/syncope (passing out).  Your physician has requested that you have an echocardiogram. Echocardiography is a painless test that uses sound waves to create images of your heart. It provides your doctor with information about the size and shape of your heart and how well your heart's chambers and valves are working. This procedure takes approximately one hour. There are no restrictions for this procedure.  Your physician has requested that you have en exercise stress myoview. For further information please visit [email or web address]. Please follow instruction sheet, as given.  Your physician has requested that you have a carotid duplex. This test is an ultrasound of the carotid arteries in your neck. It looks at blood flow through these arteries that supply the brain with blood. Allow one hour for this exam. There are no restrictions or special instructions.   Follow-Up: Your physician recommends that you schedule a follow-up appointment in: [place].   Any Other Special Instructions Will Be Listed Below (If Applicable).     If you need a refill on your cardiac medications before your next appointment, please call your pharmacy.

## 2018-02-25 NOTE — Telephone Encounter (Signed)
Patient to follow up with Cardiology.

## 2018-02-25 NOTE — Assessment & Plan Note (Signed)
History of hyperlipidemia intolerant to statin drugs 

## 2018-02-25 NOTE — Assessment & Plan Note (Signed)
History of palpitations evaluated by cardiologist in [place] 7 years ago.  She had an event monitor back then as well as stress test.  She was placed on carvedilol some improvement.  Her symptoms have gotten worse recently.  Going to obtain a 2-week event monitor to further evaluate.

## 2018-03-02 ENCOUNTER — Ambulatory Visit (HOSPITAL_COMMUNITY): Payer: Medicare HMO | Attending: Cardiovascular Disease

## 2018-03-02 ENCOUNTER — Other Ambulatory Visit: Payer: Self-pay

## 2018-03-02 DIAGNOSIS — I200 Unstable angina: Secondary | ICD-10-CM | POA: Diagnosis not present

## 2018-03-02 DIAGNOSIS — E7800 Pure hypercholesterolemia, unspecified: Secondary | ICD-10-CM | POA: Diagnosis not present

## 2018-03-02 DIAGNOSIS — R002 Palpitations: Secondary | ICD-10-CM | POA: Diagnosis not present

## 2018-03-02 DIAGNOSIS — I083 Combined rheumatic disorders of mitral, aortic and tricuspid valves: Secondary | ICD-10-CM | POA: Insufficient documentation

## 2018-03-02 DIAGNOSIS — R0989 Other specified symptoms and signs involving the circulatory and respiratory systems: Secondary | ICD-10-CM | POA: Insufficient documentation

## 2018-03-03 DIAGNOSIS — B070 Plantar wart: Secondary | ICD-10-CM | POA: Diagnosis not present

## 2018-03-03 DIAGNOSIS — L570 Actinic keratosis: Secondary | ICD-10-CM | POA: Diagnosis not present

## 2018-03-03 DIAGNOSIS — D485 Neoplasm of uncertain behavior of skin: Secondary | ICD-10-CM | POA: Diagnosis not present

## 2018-03-03 DIAGNOSIS — D0422 Carcinoma in situ of skin of left ear and external auricular canal: Secondary | ICD-10-CM | POA: Diagnosis not present

## 2018-03-03 DIAGNOSIS — D692 Other nonthrombocytopenic purpura: Secondary | ICD-10-CM | POA: Diagnosis not present

## 2018-03-03 DIAGNOSIS — L821 Other seborrheic keratosis: Secondary | ICD-10-CM | POA: Diagnosis not present

## 2018-03-03 DIAGNOSIS — L814 Other melanin hyperpigmentation: Secondary | ICD-10-CM | POA: Diagnosis not present

## 2018-03-03 DIAGNOSIS — D225 Melanocytic nevi of trunk: Secondary | ICD-10-CM | POA: Diagnosis not present

## 2018-03-03 DIAGNOSIS — D1801 Hemangioma of skin and subcutaneous tissue: Secondary | ICD-10-CM | POA: Diagnosis not present

## 2018-03-03 DIAGNOSIS — D2272 Melanocytic nevi of left lower limb, including hip: Secondary | ICD-10-CM | POA: Diagnosis not present

## 2018-03-04 ENCOUNTER — Telehealth (HOSPITAL_COMMUNITY): Payer: Self-pay

## 2018-03-04 NOTE — Telephone Encounter (Signed)
Encounter complete. 

## 2018-03-09 ENCOUNTER — Ambulatory Visit (HOSPITAL_BASED_OUTPATIENT_CLINIC_OR_DEPARTMENT_OTHER)
Admission: RE | Admit: 2018-03-09 | Discharge: 2018-03-09 | Disposition: A | Discharge status: Home / Self Care | Payer: Medicare HMO | Source: Ambulatory Visit | Attending: Cardiology | Admitting: Cardiology

## 2018-03-09 ENCOUNTER — Ambulatory Visit (HOSPITAL_COMMUNITY)
Admission: RE | Admit: 2018-03-09 | Discharge: 2018-03-09 | Disposition: A | Discharge status: Home / Self Care | Payer: Medicare HMO | Source: Ambulatory Visit | Attending: Cardiology | Admitting: Cardiology

## 2018-03-09 DIAGNOSIS — R002 Palpitations: Secondary | ICD-10-CM | POA: Insufficient documentation

## 2018-03-09 DIAGNOSIS — E7800 Pure hypercholesterolemia, unspecified: Secondary | ICD-10-CM | POA: Insufficient documentation

## 2018-03-09 DIAGNOSIS — I200 Unstable angina: Secondary | ICD-10-CM

## 2018-03-09 DIAGNOSIS — R0989 Other specified symptoms and signs involving the circulatory and respiratory systems: Secondary | ICD-10-CM | POA: Diagnosis not present

## 2018-03-09 LAB — MYOCARDIAL PERFUSION IMAGING
CHL CUP MPHR: 145 {beats}/min
CHL CUP RESTING HR STRESS: 76 {beats}/min
CHL RATE OF PERCEIVED EXERTION: 19
CSEPED: 4 min
CSEPEDS: 36 s
CSEPEW: 5.4 METS
CSEPHR: 103 %
LV dias vol: 63 mL (ref 46–106)
LV sys vol: 17 mL
Peak HR: 150 {beats}/min
SDS: 4
SRS: 0
SSS: 4
TID: 1.04

## 2018-03-09 MED ORDER — TECHNETIUM TC 99M TETROFOSMIN IV KIT
7.9000 | PACK | Freq: Once | INTRAVENOUS | Status: AC | PRN
  Administered 2018-03-09: 7.9 via INTRAVENOUS
  Filled 2018-03-09: qty 8

## 2018-03-09 MED ORDER — TECHNETIUM TC 99M TETROFOSMIN IV KIT
26.0000 | PACK | Freq: Once | INTRAVENOUS | Status: AC | PRN
  Administered 2018-03-09: 26 via INTRAVENOUS
  Filled 2018-03-09: qty 26

## 2018-03-09 MED ORDER — REGADENOSON 0.4 MG/5ML IV SOLN: 0.4000 mg | Freq: Once | INTRAVENOUS | Status: DC

## 2018-03-10 ENCOUNTER — Other Ambulatory Visit (HOSPITAL_COMMUNITY): Payer: Medicare HMO

## 2018-03-16 ENCOUNTER — Encounter: Payer: Self-pay | Admitting: Cardiovascular Disease

## 2018-03-16 ENCOUNTER — Ambulatory Visit: Payer: Medicare HMO | Admitting: Cardiovascular Disease

## 2018-03-16 DIAGNOSIS — R0989 Other specified symptoms and signs involving the circulatory and respiratory systems: Secondary | ICD-10-CM

## 2018-03-16 DIAGNOSIS — I208 Other forms of angina pectoris: Secondary | ICD-10-CM

## 2018-03-16 DIAGNOSIS — R002 Palpitations: Secondary | ICD-10-CM

## 2018-03-16 MED ORDER — CARVEDILOL 12.5 MG PO TABS: 12.5000 mg | ORAL_TABLET | Freq: Two times a day (BID) | ORAL | 3 refills | Status: DC

## 2018-03-16 NOTE — Assessment & Plan Note (Signed)
History of atypical chest pain when I saw her 3 weeks ago which has since resolved within the last week.  She had a negative Myoview stress test and a normal 2D echocardiogram.

## 2018-03-16 NOTE — Patient Instructions (Addendum)
Medication Instructions:  Your physician has recommended you make the following change in your medication:  1) INCREASE Coreg to 12.5mg  tablet by mouth TWICE daily   Labwork: none  Testing/Procedures: none  Follow-Up: You have been referred to Dr. Sallyanne Kuster - possible loop recorder   We request that you follow-up in: 3 months with an extender and in 6 months with Dr Andria Rhein will receive a reminder letter in the mail two months in advance. If you don't receive a letter, please call our office to schedule the follow-up appointment.    Any Other Special Instructions Will Be Listed Below (If Applicable).     If you need a refill on your cardiac medications before your next appointment, please call your pharmacy.

## 2018-03-16 NOTE — Assessment & Plan Note (Addendum)
History of palpitations with event monitor performed which showed PACs, PVCs, short atrial runs and a short run of nonsustained ventricular tachycardia.  I am concerned that her episodes of presyncope may be arrhythmogenic.  I am going to refer her to Dr. Sallyanne Kuster for loop recorder implantation.  I am also going to increase her carvedilol from 6.25 to 12.5 mg p.o. twice daily.

## 2018-03-16 NOTE — Progress Notes (Signed)
16

## 2018-03-16 NOTE — Progress Notes (Signed)
03/16/2018 Sophia [name]   12-29-1940  [id number]  Primary Physician Mosie Lukes, MD Primary Cardiologist: Lorretta Harp MD Garret Reddish, [place], [place]  HPI:  Sophia [name] is a 77 y.o.  mild to moderately overweight divorced Caucasian female mother of 3 children, grandmother of 5 grandchildren who is accompanied by 2 of her daughters today, Angus Palms and [name] ([name] is a patient of mine).  She was referred by the ER for evaluation of chest pain.  Her primary care provider is Dr. [name] .    I last saw her in the office 02/25/2018.  Risk factors include hyperlipidemia intolerant to statin therapy and family history with mother who had a myocardial infarction at age 63 as well as extensive vascular disease.  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  She is never had a heart attack or stroke.  She has had palpitations evaluated by cardiologist in [place] 7 years ago which time a work-up included stress testing and event monitoring.  She was placed on carvedilol at that time.  She was seen in the emergency room at [facility] on 02/22/2018 with chest pain and palpitations.  Pain began in the early morning hours and got worse.  She thought it was indigestion.  She did feel increasing palpitations as well as shortness of breath.  She says she can also hear her "heartbeat" in her left ear.  Her troponins were negative and her EKG showed no acute changes at that time. Since I saw her 3 weeks ago I did obtain a 2D echo which was essentially normal with aortic sclerosis and very mild pulmonary hypertension, and a Myoview stress test which was entirely normal as were carotid Doppler studies.  An event monitor was performed which showed PACs, PVCs, short atrial runs and a short run of nonsustained ventricular tachycardia.  She has had some presyncopal episodes in the past which could be arrhythmogenic.  Current Meds  Medication Sig  . ALPRAZolam  (XANAX) 0.25 MG tablet Take 1 tablet (0.25 mg total) by mouth daily as needed for anxiety or sleep.  [name] aspirin 81 MG tablet Take 81 mg by mouth 3 (three) times a week.  . B Complex-C (SUPER B COMPLEX) TABS Take 1 tablet by mouth daily.  . carvedilol (COREG) 12.5 MG tablet Take 1 tablet (12.5 mg total) by mouth 2 (two) times daily.  . Coenzyme Q10 (COQ-10) 200 MG CAPS Take 1 capsule by mouth daily.  [name] estradiol (ESTRACE) 0.1 MG/GM vaginal cream Place 1 Applicatorful vaginally 2 (two) times a week.  . Omega-3 Fatty Acids (FISH OIL) 1200 MG CAPS Take 1 capsule by mouth 2 (two) times daily.  [name] OVER THE COUNTER MEDICATION Citrical with Vit D3 630mg /500IU-Take 1 capsule by mouth daily.  . Probiotic Product (PROBIOTIC DAILY PO) Take by mouth daily.  . vitamin C (ASCORBIC ACID) 500 MG tablet Take 500 mg by mouth daily.  . [DISCONTINUED] carvedilol (COREG) 6.25 MG tablet Take 1 tablet (6.25 mg total) by mouth 2 (two) times daily.     Allergies  Allergen Reactions  . Morphine And Related Swelling  . Statins Other (See Comments)    Myalgias, weakness  . Sulfa Antibiotics Swelling and Rash    Social History   Socioeconomic History  . Marital status: Single    Spouse name: Not on file  . Number of children: Not on file  . Years of education: Not  on file  . Highest education level: Not on file  Occupational History  . Not on file  Social Needs  . Financial resource strain: Not on file  . Food insecurity:    Worry: Not on file    Inability: Not on file  . Transportation needs:    Medical: Not on file    Non-medical: Not on file  Tobacco Use  . Smoking status: Never Smoker  . Smokeless tobacco: Never Used  Substance and Sexual Activity  . Alcohol use: No    Alcohol/week: 0.0 standard drinks  . Drug use: No  . Sexual activity: Never    Comment: divorced, widowed, lives with elderly parents, is the caregiver, no dietary restrictions.   Lifestyle  . Physical activity:    Days per week:  Not on file    Minutes per session: Not on file  . Stress: Not on file  Relationships  . Social connections:    Talks on phone: Not on file    Gets together: Not on file    Attends religious service: Not on file    Active member of club or organization: Not on file    Attends meetings of clubs or organizations: Not on file    Relationship status: Not on file  . Intimate partner violence:    Fear of current or ex partner: Not on file    Emotionally abused: Not on file    Physically abused: Not on file    Forced sexual activity: Not on file  Other Topics Concern  . Not on file  Social History Narrative  . Not on file     Review of Systems: General: negative for chills, fever, night sweats or weight changes.  Cardiovascular: negative for chest pain, dyspnea on exertion, edema, orthopnea, palpitations, paroxysmal nocturnal dyspnea or shortness of breath Dermatological: negative for rash Respiratory: negative for cough or wheezing Urologic: negative for hematuria Abdominal: negative for nausea, vomiting, diarrhea, bright red blood per rectum, melena, or hematemesis Neurologic: negative for visual changes, syncope, or dizziness All other systems reviewed and are otherwise negative except as noted above.    Blood pressure (!) 164/88, pulse 74, height 4\' 11"  (1.499 m), weight 138 lb 12.8 oz (63 kg).  General appearance: alert and no distress Neck: no adenopathy, no carotid bruit, no JVD, supple, symmetrical, trachea midline and thyroid not enlarged, symmetric, no tenderness/mass/nodules Lungs: clear to auscultation bilaterally Heart: regular rate and rhythm, S1, S2 normal, no murmur, click, rub or gallop Extremities: extremities normal, atraumatic, no cyanosis or edema Pulses: 2+ and symmetric Skin: Skin color, texture, turgor normal. No rashes or lesions Neurologic: Alert and oriented X 3, normal strength and tone. Normal symmetric reflexes. Normal coordination and gait  EKG  performed today  ASSESSMENT AND PLAN:   Palpitations History of palpitations with event monitor performed which showed PACs, PVCs, short atrial runs and a short run of nonsustained ventricular tachycardia.  I am concerned that her episodes of presyncope may be arrhythmogenic.  I am going to refer her to Dr. Sallyanne Kuster for loop recorder implantation.  I am also going to increase her carvedilol from 6.25 to 12.5 mg p.o. twice daily.  Bilateral carotid bruits History of bilateral carotid bruits which I do not hear today and carotid Dopplers that did not show any internal or external carotid artery stenosis.  Chest pain History of atypical chest pain when I saw her 3 weeks ago which has since resolved within the last week.  She had  a negative Myoview stress test and a normal 2D echocardiogram.      Lorretta Harp MD [facility], [facility] 03/16/2018 3:06 PM

## 2018-03-16 NOTE — Assessment & Plan Note (Signed)
History of bilateral carotid bruits which I do not hear today and carotid Dopplers that did not show any internal or external carotid artery stenosis.

## 2018-03-31 ENCOUNTER — Other Ambulatory Visit: Payer: Self-pay

## 2018-04-05 DIAGNOSIS — C44229 Squamous cell carcinoma of skin of left ear and external auricular canal: Secondary | ICD-10-CM | POA: Diagnosis not present

## 2018-04-05 DIAGNOSIS — Z85828 Personal history of other malignant neoplasm of skin: Secondary | ICD-10-CM | POA: Diagnosis not present

## 2018-04-11 DIAGNOSIS — N8111 Cystocele, midline: Secondary | ICD-10-CM | POA: Diagnosis not present

## 2018-04-12 ENCOUNTER — Ambulatory Visit: Payer: Medicare HMO | Admitting: Cardiovascular Disease

## 2018-04-12 ENCOUNTER — Encounter: Payer: Self-pay | Admitting: Cardiovascular Disease

## 2018-04-12 VITALS — BP 162/82 | HR 75 | Ht 60.0 in | Wt 139.4 lb

## 2018-04-12 DIAGNOSIS — R0789 Other chest pain: Secondary | ICD-10-CM | POA: Diagnosis not present

## 2018-04-12 DIAGNOSIS — I472 Ventricular tachycardia: Secondary | ICD-10-CM | POA: Diagnosis not present

## 2018-04-12 DIAGNOSIS — R55 Syncope and collapse: Secondary | ICD-10-CM | POA: Diagnosis not present

## 2018-04-12 DIAGNOSIS — I4729 Other ventricular tachycardia: Secondary | ICD-10-CM

## 2018-04-12 NOTE — Patient Instructions (Signed)
  [facility], [place]  [place], [place] [address]  Phone: [phone] Fax: [phone]   You are scheduled for a Loop Recorder Implant on Friday, October 18th, 2019 with Dr. Sallyanne Kuster.   Please arrive at the [facility] ([place]) at 12:30 pm on the day of your procedure.    You do not need to be fasting.   You do not need pre-procedure lab work or testing.   Please bring your insurance cards and a list of your medications with you.   Wash your chest and neck with the surgical soap provided the evening before and the morning of your procedure. Please following the washing instructions provided.   There aren't any restrictions after the procedure. You will receive wound care instructions upon discharge.  If you have any questions after you get home, please call the office at [phone].   Thank you, Sanda Klein, MD   * Special note:  Every effort is made to have your procedure done on time.  Occasionally there are emergencies that present themselves at the hospital that may cause delays.  Please be patient if a delay does occur.  Preparing for Surgery  Before surgery, you can play an important role. Because skin is not sterile, your skin needs to be as free of germs as possible. You can reduce the number of germs on your skin by washing with CHG (chlorhexidine gluconate) Soap before surgery. CHG is an antiseptic cleaner which kills germs and bonds with the skin to continue killing germs even after washing.  Please do not use if you have an allergy to CHG or antibacterial soaps. If your skin becomes reddened/irritated, STOP using the CHG.  DO NOT SHAVE (including legs and underarms) for at least 48 hours prior to first CHG shower. It is OK to shave your face.  Please follow these instructions carefully: 1. Shower the night before surgery and the morning of surgery with  CHG Soap. 2. If you chose to wash your hair, wash your hair first as usual with your normal shampoo/conditioner. 3. After you shampoo/condition, rinse you hair and body thoroughly to remove shampoo/conditioner. 4. Use CHG as you would any other liquid soap. You can apply CHG directly to the skin and wash gently with a loofah or a clean washcloth. 5. Apply the CHG Soap to your body ONLY FROM THE NECK DOWN. Do not use on open wounds or open sores. Avoid contact with your eyes, ears, mouth, and genitals (private parts). Wash genitals (private part) with your normal soap. 6. Wash thoroughly, paying special attention to the area where your surgery will be performed. 7. Thoroughly rinse your body with warm water from the neck down. 8. DO NOT shower/wash with your normal soap after using and rinsing off the CHG Soap. 9. Pat yourself dry with a clean towel. 10. Wear clean pajamas to bed. 11. Place clean sheets on your bed the night of your first shower and do not sleep with pets..  Day of Surgery: Shower with the CHG Soap following the instructions listed above. DO NOT apply deodorants or lotions. Please wear clean clothes to the hospital/surgery center.

## 2018-04-12 NOTE — H&P (View-Only) (Signed)
Cardiology Office Note:    Date:  04/14/2018   ID:  Sophia [name], DOB 1940-11-30, MRN [id number]  PCP:  Mosie Lukes, MD  Cardiologist:  Quay Burow, MD  Electrophysiologist:  None   Referring MD: Mosie Lukes, MD   Chief Complaint  Patient presents with  . Loss of Consciousness    Discussed implantable loop recorder    History of Present Illness:    Sophia [name] is a 77 y.o. female with a hx of presyncope, referred by Dr. Gwenlyn Found to discuss implantation of a loop recorder.  Portable external event monitor that showed evidence of both nonsustained ventricular tachycardia and atrial tachycardia.  It is unclear which of these rhythm abnormalities may have been responsible for her near syncope.  The patient specifically denies any chest pain at rest or with exertion, dyspnea at rest or with exertion, orthopnea, paroxysmal nocturnal dyspnea, or blood tests in July photographic at same.  Syncope, focal neurological deficits, intermittent claudication, lower extremity edema, unexplained weight gain, cough, hemoptysis or wheezing.  She has a history of hyperlipidemia but has not tolerated statins.  She has a family history of premature onset coronary disease in a first-degree female relatives.  Her recent echocardiogram does not show significant structural heart disease.  LVEF is normal. She had a low risk nuclear myocardial perfusion study.  She does not have carotid stenosis on ultrasound studies.  She has had previous problems with chest pain, but none recently and the work-up so far has been negative.  She has frequent palpitations and complains of a rushing sensation in her ear with each heartbeat.  This is positional.     Past Medical History:  Diagnosis Date  . Anxiety and depression   . Arthritis    hands, knees, etc  . Bladder prolapse, female, acquired 09/16/2014  . Bowen's disease   . Diverticulitis   . Diverticulosis   . GERD (gastroesophageal reflux disease)    . H/O hematuria    for several years, work only revealed kidney stone on right side  . History of chicken pox   . History of kidney stones   . Hyperlipidemia   . Hypertension   . Leg cramps, sleep related 04/02/2016  . Myalgia 04/25/2017  . Osteopenia 06/26/2014  . Osteoporosis 06/26/2014  . Plantar wart of left foot 10/15/2016  . Right shoulder pain 04/23/2017  . SCC (squamous cell carcinoma)    arms    Past Surgical History:  Procedure Laterality Date  . CHOLECYSTECTOMY  2005  . SKIN BIOPSY     multiple    Current Medications: Current Meds  Medication Sig  . ALPRAZolam (XANAX) 0.25 MG tablet Take 1 tablet (0.25 mg total) by mouth daily as needed for anxiety or sleep.  [name] aspirin 81 MG tablet Take 81 mg by mouth every Monday, Wednesday, and Friday.   . B Complex-C (SUPER B COMPLEX) TABS Take 1 tablet by mouth daily.  . carvedilol (COREG) 12.5 MG tablet Take 1 tablet (12.5 mg total) by mouth 2 (two) times daily.  . Coenzyme Q10 (COQ-10) 200 MG CAPS Take 200 mg by mouth daily.   [name] estradiol (ESTRACE) 0.1 MG/GM vaginal cream Place 1 Applicatorful vaginally 2 (two) times a week.  . Omega-3 Fatty Acids (FISH OIL) 1200 MG CAPS Take 1,200 mg by mouth 2 (two) times daily.   . Probiotic Product (PROBIOTIC DAILY PO) Take 1 capsule by mouth daily.   . vitamin C (ASCORBIC ACID) 500 MG tablet  Take 500 mg by mouth daily.  . [DISCONTINUED] OVER THE COUNTER MEDICATION Citrical with Vit D3 630mg /500IU-Take 1 capsule by mouth daily.     Allergies:   Morphine and related; Statins; and Sulfa antibiotics   Social History   Socioeconomic History  . Marital status: Single    Spouse name: Not on file  . Number of children: Not on file  . Years of education: Not on file  . Highest education level: Not on file  Occupational History  . Not on file  Social Needs  . Financial resource strain: Not on file  . Food insecurity:    Worry: Not on file    Inability: Not on file  . Transportation  needs:    Medical: Not on file    Non-medical: Not on file  Tobacco Use  . Smoking status: Never Smoker  . Smokeless tobacco: Never Used  Substance and Sexual Activity  . Alcohol use: No    Alcohol/week: 0.0 standard drinks  . Drug use: No  . Sexual activity: Never    Comment: divorced, widowed, lives with elderly parents, is the caregiver, no dietary restrictions.   Lifestyle  . Physical activity:    Days per week: Not on file    Minutes per session: Not on file  . Stress: Not on file  Relationships  . Social connections:    Talks on phone: Not on file    Gets together: Not on file    Attends religious service: Not on file    Active member of club or organization: Not on file    Attends meetings of clubs or organizations: Not on file    Relationship status: Not on file  Other Topics Concern  . Not on file  Social History Narrative  . Not on file     Family History: The patient's family history includes Birth defects in her maternal uncle; Cancer in her father, mother, and paternal grandfather; Early death in her daughter; Heart attack in her mother; Heart disease in her maternal grandfather and mother; Hyperlipidemia in her brother and mother; Hypertension in her brother, daughter, father, and mother; Tuberculosis in her paternal grandmother.  ROS:   Please see the history of present illness.     All other systems reviewed and are negative.  EKGs/Labs/Other Studies Reviewed:    The following studies were reviewed today: Notes from Dr. Broadus John, echo, nuclear stress test, carotid Dopplers  EKG:  EKG is not ordered today.    Recent Labs: 04/23/2017: ALT 14; TSH 1.22 02/22/2018: BUN 11; Creatinine, Ser 0.78; Hemoglobin 12.9; Platelets 167; Potassium 4.2; Sodium 137  Recent Lipid Panel    Component Value Date/Time   CHOL 241 (H) 04/23/2017 1035   TRIG 215.0 (H) 04/23/2017 1035   HDL 53.80 04/23/2017 1035   CHOLHDL 4 04/23/2017 1035   VLDL 43.0 (H) 04/23/2017  1035   LDLCALC 130 (H) 03/12/2015 0937   LDLDIRECT 150.0 04/23/2017 1035    Physical Exam:    VS:  BP (!) 162/82   Pulse 75   Ht 5' (1.524 m)   Wt 139 lb 6.4 oz (63.2 kg)   SpO2 99%   BMI 27.22 kg/m     Wt Readings from Last 3 Encounters:  04/12/18 139 lb 6.4 oz (63.2 kg)  03/16/18 138 lb 12.8 oz (63 kg)  03/09/18 139 lb (63 kg)     GEN:  Well nourished, well developed in no acute distress HEENT: Normal NECK: No JVD; No carotid  bruits LYMPHATICS: No lymphadenopathy CARDIAC: RRR, no murmurs, rubs, gallops RESPIRATORY:  Clear to auscultation without rales, wheezing or rhonchi  ABDOMEN: Soft, non-tender, non-distended MUSCULOSKELETAL:  No edema; No deformity  SKIN: Warm and dry NEUROLOGIC:  Alert and oriented x 3 PSYCHIATRIC:  Normal affect   ASSESSMENT:    1. Near syncope   2. Atypical chest pain   3. Nonsustained ventricular tachycardia (HCC)    PLAN:    In order of problems listed above:  1. Near-syncope: Discussed the purpose of an implantable loop recorder, pros and cons, potential complications.This procedure has been fully reviewed with the patient and written informed consent has been obtained. 2. Chest pain: Atypical pattern, work-up negative so far. 3. NSVT: Brief episodes recorded on event monitor, concerning for possible arrhythmic cause of her syncope, but without clear association between the arrhythmia and symptoms.   Medication Adjustments/Labs and Tests Ordered: Current medicines are reviewed at length with the patient today.  Concerns regarding medicines are outlined above.  No orders of the defined types were placed in this encounter.  No orders of the defined types were placed in this encounter.   Patient Instructions   [facility] [place], [place]  [place], [place] [address]  Phone: [phone] Fax: [phone]   You are scheduled for a Loop Recorder Implant on  Friday, October 18th, 2019  with Dr. Sallyanne Kuster.   Please arrive at the [place] "A" of [facility] ([place]) at  12:30 pm on the day of your procedure.    You do not need to be fasting.   You do not need pre-procedure lab work or testing.   Please bring your insurance cards and a list of your medications with you.   Wash your chest and neck with the surgical soap provided the evening before and the morning of your procedure. Please following the washing instructions provided.   There aren't any restrictions after the procedure. You will receive wound care instructions upon discharge.  If you have any questions after you get home, please call the office at [phone].   Thank you, Sanda Klein, MD   * Special note:  Every effort is made to have your procedure done on time.  Occasionally there are emergencies that present themselves at the hospital that may cause delays.  Please be patient if a delay does occur.  Preparing for Surgery  Before surgery, you can play an important role. Because skin is not sterile, your skin needs to be as free of germs as possible. You can reduce the number of germs on your skin by washing with CHG (chlorhexidine gluconate) Soap before surgery. CHG is an antiseptic cleaner which kills germs and bonds with the skin to continue killing germs even after washing.  Please do not use if you have an allergy to CHG or antibacterial soaps. If your skin becomes reddened/irritated, STOP using the CHG.  DO NOT SHAVE (including legs and underarms) for at least 48 hours prior to first CHG shower. It is OK to shave your face.  Please follow these instructions carefully: 1. Shower the night before surgery and the morning of surgery with CHG Soap. 2. If you chose to wash your hair, wash your hair first as usual with your normal shampoo/conditioner. 3. After you shampoo/condition, rinse you hair and body thoroughly to remove shampoo/conditioner. 4. Use CHG  as you would any other liquid soap. You can apply CHG  directly to the skin and wash gently with a loofah or a clean washcloth. 5. Apply the CHG Soap to your body ONLY FROM THE NECK DOWN. Do not use on open wounds or open sores. Avoid contact with your eyes, ears, mouth, and genitals (private parts). Wash genitals (private part) with your normal soap. 6. Wash thoroughly, paying special attention to the area where your surgery will be performed. 7. Thoroughly rinse your body with warm water from the neck down. 8. DO NOT shower/wash with your normal soap after using and rinsing off the CHG Soap. 9. Pat yourself dry with a clean towel. 10. Wear clean pajamas to bed. 11. Place clean sheets on your bed the night of your first shower and do not sleep with pets..  Day of Surgery: Shower with the CHG Soap following the instructions listed above. DO NOT apply deodorants or lotions. Please wear clean clothes to the hospital/surgery center.     Signed, Sanda Klein, MD  04/14/2018 11:40 AM    [facility]

## 2018-04-12 NOTE — Progress Notes (Signed)
Cardiology Office Note:    Date:  04/14/2018   ID:  Sophia [name], DOB [birth year], MRN [id number]  PCP:  Mosie Lukes, MD  Cardiologist:  Quay Burow, MD  Electrophysiologist:  None   Referring MD: Mosie Lukes, MD   Chief Complaint  Patient presents with  . Loss of Consciousness    Discussed implantable loop recorder    History of Present Illness:    Sophia [name] is a 77 y.o. female with a hx of presyncope, referred by Dr. Gwenlyn Found to discuss implantation of a loop recorder.  Portable external event monitor that showed evidence of both nonsustained ventricular tachycardia and atrial tachycardia.  It is unclear which of these rhythm abnormalities may have been responsible for her near syncope.  The patient specifically denies any chest pain at rest or with exertion, dyspnea at rest or with exertion, orthopnea, paroxysmal nocturnal dyspnea, or blood tests in July photographic at same.  Syncope, focal neurological deficits, intermittent claudication, lower extremity edema, unexplained weight gain, cough, hemoptysis or wheezing.  She has a history of hyperlipidemia but has not tolerated statins.  She has a family history of premature onset coronary disease in a first-degree female relatives.  Her recent echocardiogram does not show significant structural heart disease.  LVEF is normal. She had a low risk nuclear myocardial perfusion study.  She does not have carotid stenosis on ultrasound studies.  She has had previous problems with chest pain, but none recently and the work-up so far has been negative.  She has frequent palpitations and complains of a rushing sensation in her ear with each heartbeat.  This is positional.     Past Medical History:  Diagnosis Date  . Anxiety and depression   . Arthritis    hands, knees, etc  . Bladder prolapse, female, acquired 09/16/2014  . Bowen's disease   . Diverticulitis   . Diverticulosis   . GERD (gastroesophageal reflux disease)    . H/O hematuria    for several years, work only revealed kidney stone on right side  . History of chicken pox   . History of kidney stones   . Hyperlipidemia   . Hypertension   . Leg cramps, sleep related 04/02/2016  . Myalgia 04/25/2017  . Osteopenia 06/26/2014  . Osteoporosis 06/26/2014  . Plantar wart of left foot 10/15/2016  . Right shoulder pain 04/23/2017  . SCC (squamous cell carcinoma)    arms    Past Surgical History:  Procedure Laterality Date  . CHOLECYSTECTOMY  2005  . SKIN BIOPSY     multiple    Current Medications: Current Meds  Medication Sig  . ALPRAZolam (XANAX) 0.25 MG tablet Take 1 tablet (0.25 mg total) by mouth daily as needed for anxiety or sleep.  [name] aspirin 81 MG tablet Take 81 mg by mouth every Monday, Wednesday, and Friday.   . B Complex-C (SUPER B COMPLEX) TABS Take 1 tablet by mouth daily.  . carvedilol (COREG) 12.5 MG tablet Take 1 tablet (12.5 mg total) by mouth 2 (two) times daily.  . Coenzyme Q10 (COQ-10) 200 MG CAPS Take 200 mg by mouth daily.   [name] estradiol (ESTRACE) 0.1 MG/GM vaginal cream Place 1 Applicatorful vaginally 2 (two) times a week.  . Omega-3 Fatty Acids (FISH OIL) 1200 MG CAPS Take 1,200 mg by mouth 2 (two) times daily.   . Probiotic Product (PROBIOTIC DAILY PO) Take 1 capsule by mouth daily.   . vitamin C (ASCORBIC ACID) 500 MG tablet  Take 500 mg by mouth daily.  . [DISCONTINUED] OVER THE COUNTER MEDICATION Citrical with Vit D3 630mg /500IU-Take 1 capsule by mouth daily.     Allergies:   Morphine and related; Statins; and Sulfa antibiotics   Social History   Socioeconomic History  . Marital status: Single    Spouse name: Not on file  . Number of children: Not on file  . Years of education: Not on file  . Highest education level: Not on file  Occupational History  . Not on file  Social Needs  . Financial resource strain: Not on file  . Food insecurity:    Worry: Not on file    Inability: Not on file  . Transportation  needs:    Medical: Not on file    Non-medical: Not on file  Tobacco Use  . Smoking status: Never Smoker  . Smokeless tobacco: Never Used  Substance and Sexual Activity  . Alcohol use: No    Alcohol/week: 0.0 standard drinks  . Drug use: No  . Sexual activity: Never    Comment: divorced, widowed, lives with elderly parents, is the caregiver, no dietary restrictions.   Lifestyle  . Physical activity:    Days per week: Not on file    Minutes per session: Not on file  . Stress: Not on file  Relationships  . Social connections:    Talks on phone: Not on file    Gets together: Not on file    Attends religious service: Not on file    Active member of club or organization: Not on file    Attends meetings of clubs or organizations: Not on file    Relationship status: Not on file  Other Topics Concern  . Not on file  Social History Narrative  . Not on file     Family History: The patient's family history includes Birth defects in her maternal uncle; Cancer in her father, mother, and paternal grandfather; Early death in her daughter; Heart attack in her mother; Heart disease in her maternal grandfather and mother; Hyperlipidemia in her brother and mother; Hypertension in her brother, daughter, father, and mother; Tuberculosis in her paternal grandmother.  ROS:   Please see the history of present illness.     All other systems reviewed and are negative.  EKGs/Labs/Other Studies Reviewed:    The following studies were reviewed today: Notes from Dr. Broadus John, echo, nuclear stress test, carotid Dopplers  EKG:  EKG is not ordered today.    Recent Labs: 04/23/2017: ALT 14; TSH 1.22 02/22/2018: BUN 11; Creatinine, Ser 0.78; Hemoglobin 12.9; Platelets 167; Potassium 4.2; Sodium 137  Recent Lipid Panel    Component Value Date/Time   CHOL 241 (H) 04/23/2017 1035   TRIG 215.0 (H) 04/23/2017 1035   HDL 53.80 04/23/2017 1035   CHOLHDL 4 04/23/2017 1035   VLDL 43.0 (H) 04/23/2017  1035   LDLCALC 130 (H) 03/12/2015 0937   LDLDIRECT 150.0 04/23/2017 1035    Physical Exam:    VS:  BP (!) 162/82   Pulse 75   Ht 5' (1.524 m)   Wt 139 lb 6.4 oz (63.2 kg)   SpO2 99%   BMI 27.22 kg/m     Wt Readings from Last 3 Encounters:  04/12/18 139 lb 6.4 oz (63.2 kg)  03/16/18 138 lb 12.8 oz (63 kg)  03/09/18 139 lb (63 kg)     GEN:  Well nourished, well developed in no acute distress HEENT: Normal NECK: No JVD; No carotid  bruits LYMPHATICS: No lymphadenopathy CARDIAC: RRR, no murmurs, rubs, gallops RESPIRATORY:  Clear to auscultation without rales, wheezing or rhonchi  ABDOMEN: Soft, non-tender, non-distended MUSCULOSKELETAL:  No edema; No deformity  SKIN: Warm and dry NEUROLOGIC:  Alert and oriented x 3 PSYCHIATRIC:  Normal affect   ASSESSMENT:    1. Near syncope   2. Atypical chest pain   3. Nonsustained ventricular tachycardia (HCC)    PLAN:    In order of problems listed above:  1. Near-syncope: Discussed the purpose of an implantable loop recorder, pros and cons, potential complications.This procedure has been fully reviewed with the patient and written informed consent has been obtained. 2. Chest pain: Atypical pattern, work-up negative so far. 3. NSVT: Brief episodes recorded on event monitor, concerning for possible arrhythmic cause of her syncope, but without clear association between the arrhythmia and symptoms.   Medication Adjustments/Labs and Tests Ordered: Current medicines are reviewed at length with the patient today.  Concerns regarding medicines are outlined above.  No orders of the defined types were placed in this encounter.  No orders of the defined types were placed in this encounter.   Patient Instructions   [facility] at [facility] [place], [place]  [place], [place] [address]  Phone: [phone] Fax: [phone]   You are scheduled for a Loop Recorder Implant on  Friday, October 18th, 2019  with Dr. Sallyanne Kuster.   Please arrive at the [place] "A" of [facility] ([place]) at  12:30 pm on the day of your procedure.    You do not need to be fasting.   You do not need pre-procedure lab work or testing.   Please bring your insurance cards and a list of your medications with you.   Wash your chest and neck with the surgical soap provided the evening before and the morning of your procedure. Please following the washing instructions provided.   There aren't any restrictions after the procedure. You will receive wound care instructions upon discharge.  If you have any questions after you get home, please call the office at [phone].   Thank you, Sanda Klein, MD   * Special note:  Every effort is made to have your procedure done on time.  Occasionally there are emergencies that present themselves at the hospital that may cause delays.  Please be patient if a delay does occur.  Preparing for Surgery  Before surgery, you can play an important role. Because skin is not sterile, your skin needs to be as free of germs as possible. You can reduce the number of germs on your skin by washing with CHG (chlorhexidine gluconate) Soap before surgery. CHG is an antiseptic cleaner which kills germs and bonds with the skin to continue killing germs even after washing.  Please do not use if you have an allergy to CHG or antibacterial soaps. If your skin becomes reddened/irritated, STOP using the CHG.  DO NOT SHAVE (including legs and underarms) for at least 48 hours prior to first CHG shower. It is OK to shave your face.  Please follow these instructions carefully: 1. Shower the night before surgery and the morning of surgery with CHG Soap. 2. If you chose to wash your hair, wash your hair first as usual with your normal shampoo/conditioner. 3. After you shampoo/condition, rinse you hair and body thoroughly to remove shampoo/conditioner. 4. Use CHG  as you would any other liquid soap. You can apply CHG  directly to the skin and wash gently with a loofah or a clean washcloth. 5. Apply the CHG Soap to your body ONLY FROM THE NECK DOWN. Do not use on open wounds or open sores. Avoid contact with your eyes, ears, mouth, and genitals (private parts). Wash genitals (private part) with your normal soap. 6. Wash thoroughly, paying special attention to the area where your surgery will be performed. 7. Thoroughly rinse your body with warm water from the neck down. 8. DO NOT shower/wash with your normal soap after using and rinsing off the CHG Soap. 9. Pat yourself dry with a clean towel. 10. Wear clean pajamas to bed. 11. Place clean sheets on your bed the night of your first shower and do not sleep with pets..  Day of Surgery: Shower with the CHG Soap following the instructions listed above. DO NOT apply deodorants or lotions. Please wear clean clothes to the hospital/surgery center.     Signed, Sanda Klein, MD  04/14/2018 11:40 AM    [facility]

## 2018-04-14 DIAGNOSIS — I472 Ventricular tachycardia: Secondary | ICD-10-CM | POA: Insufficient documentation

## 2018-04-14 DIAGNOSIS — I4729 Other ventricular tachycardia: Secondary | ICD-10-CM | POA: Insufficient documentation

## 2018-04-15 ENCOUNTER — Encounter: Payer: Medicare HMO | Admitting: Vascular Surgery

## 2018-04-15 ENCOUNTER — Telehealth: Payer: Self-pay | Admitting: Cardiovascular Disease

## 2018-04-15 ENCOUNTER — Encounter (HOSPITAL_COMMUNITY): Payer: Medicare HMO

## 2018-04-15 ENCOUNTER — Encounter (HOSPITAL_COMMUNITY): Payer: Self-pay | Admitting: Cardiovascular Disease

## 2018-04-15 ENCOUNTER — Encounter (HOSPITAL_COMMUNITY)
Admission: RE | Disposition: A | Discharge status: Home / Self Care | Payer: Self-pay | Source: Ambulatory Visit | Attending: Cardiovascular Disease

## 2018-04-15 ENCOUNTER — Ambulatory Visit (HOSPITAL_COMMUNITY)
Admission: RE | Admit: 2018-04-15 | Discharge: 2018-04-15 | Disposition: A | Discharge status: Home / Self Care | Payer: Medicare HMO | Source: Ambulatory Visit | Attending: Cardiovascular Disease | Admitting: Cardiovascular Disease

## 2018-04-15 DIAGNOSIS — E785 Hyperlipidemia, unspecified: Secondary | ICD-10-CM | POA: Insufficient documentation

## 2018-04-15 DIAGNOSIS — F329 Major depressive disorder, single episode, unspecified: Secondary | ICD-10-CM | POA: Insufficient documentation

## 2018-04-15 DIAGNOSIS — Z7982 Long term (current) use of aspirin: Secondary | ICD-10-CM | POA: Insufficient documentation

## 2018-04-15 DIAGNOSIS — R002 Palpitations: Secondary | ICD-10-CM

## 2018-04-15 DIAGNOSIS — Z8249 Family history of ischemic heart disease and other diseases of the circulatory system: Secondary | ICD-10-CM | POA: Diagnosis not present

## 2018-04-15 DIAGNOSIS — R05 Cough: Secondary | ICD-10-CM | POA: Diagnosis not present

## 2018-04-15 DIAGNOSIS — I472 Ventricular tachycardia: Secondary | ICD-10-CM

## 2018-04-15 DIAGNOSIS — I10 Essential (primary) hypertension: Secondary | ICD-10-CM | POA: Insufficient documentation

## 2018-04-15 DIAGNOSIS — K219 Gastro-esophageal reflux disease without esophagitis: Secondary | ICD-10-CM | POA: Insufficient documentation

## 2018-04-15 DIAGNOSIS — M1990 Unspecified osteoarthritis, unspecified site: Secondary | ICD-10-CM | POA: Diagnosis not present

## 2018-04-15 DIAGNOSIS — M810 Age-related osteoporosis without current pathological fracture: Secondary | ICD-10-CM | POA: Insufficient documentation

## 2018-04-15 DIAGNOSIS — Z885 Allergy status to narcotic agent status: Secondary | ICD-10-CM | POA: Insufficient documentation

## 2018-04-15 DIAGNOSIS — Z882 Allergy status to sulfonamides status: Secondary | ICD-10-CM | POA: Insufficient documentation

## 2018-04-15 DIAGNOSIS — I471 Supraventricular tachycardia: Secondary | ICD-10-CM | POA: Insufficient documentation

## 2018-04-15 DIAGNOSIS — F419 Anxiety disorder, unspecified: Secondary | ICD-10-CM | POA: Insufficient documentation

## 2018-04-15 DIAGNOSIS — R55 Syncope and collapse: Secondary | ICD-10-CM | POA: Diagnosis not present

## 2018-04-15 DIAGNOSIS — I739 Peripheral vascular disease, unspecified: Secondary | ICD-10-CM | POA: Insufficient documentation

## 2018-04-15 DIAGNOSIS — I4729 Other ventricular tachycardia: Secondary | ICD-10-CM

## 2018-04-15 HISTORY — PX: LOOP RECORDER INSERTION: EP1214

## 2018-04-15 SURGERY — LOOP RECORDER INSERTION

## 2018-04-15 MED ORDER — LIDOCAINE-EPINEPHRINE 1 %-1:100000 IJ SOLN
INTRAMUSCULAR | Status: AC
  Filled 2018-04-15: qty 1

## 2018-04-15 MED ORDER — LIDOCAINE-EPINEPHRINE 1 %-1:100000 IJ SOLN
INTRAMUSCULAR | Status: DC | PRN
  Administered 2018-04-15: 5 mL

## 2018-04-15 SURGICAL SUPPLY — 2 items
LOOP REVEAL LINQSYS (Prosthesis & Implant Heart) ×3 IMPLANT
PACK LOOP INSERTION (CUSTOM PROCEDURE TRAY) ×3 IMPLANT

## 2018-04-15 NOTE — Telephone Encounter (Signed)
Dr. Loletha Grayer probably needs to do a peer to peer to approve the implantable loop recorder

## 2018-04-15 NOTE — Telephone Encounter (Signed)
Forward to billing department 

## 2018-04-15 NOTE — Telephone Encounter (Signed)
New Message    Randall Hiss with [employer] is calling on behalf of patient. Patient is scheduled to have a loop recorder done today and they are needing some clinical information. Please call to discuss. Please refer to case number [id number] he could not authorize the loop recorder at the nurse level so they need to know what to do.

## 2018-04-15 NOTE — Telephone Encounter (Signed)
ERIN  Called and stated the ILR was denied and they had not been able to do peer-peer, so was being sent "over" to be reviewed because of the denial.  She was calling to see if it would be ok for it to be routine and done on Monday.  If not, please call on Monday and get it expedited.  Rosaria Ferries, PA-C 04/15/2018 5:25 PM Beeper [phone]

## 2018-04-15 NOTE — Progress Notes (Signed)
Discharge instructions given to pt. Voices understanding. 

## 2018-04-15 NOTE — Op Note (Signed)
LOOP RECORDER IMPLANT   Procedure report  Procedure performed:  Loop recorder implantation   Reason for procedure:  Recurrent syncope/near-syncope  Procedure performed by:  Sanda Klein, MD  Complications:  None  Estimated blood loss:  <5 mL  Medications administered during procedure:  Lidocaine 1% with 1/10,000 epinephrine 10 mL locally Device details:  Medtronic Reveal Linq model number [id number], serial number [id number] Procedure details:  After the risks and benefits of the procedure were discussed the patient provided informed consent. The patient was prepped and draped in usual sterile fashion. Local anesthesia was administered to an area 2 cm to the left of the sternum in the 4th intercostal space. A cutaneous incision was made using the incision tool. The introducer was then used to create a subcutaneous tunnel and carefully deploy the device. Local pressure was held to ensure hemostasis.  The incision was closed with SteriStrips and a sterile dressing was applied.  R waves 0.80 mV.  Sanda Klein, MD, [facility] [phone] office [phone] pager 04/15/2018 1:49 PM

## 2018-04-15 NOTE — Interval H&P Note (Signed)
History and Physical Interval Note:  04/15/2018 1:22 PM  Sophia [name]  has presented today for surgery, with the diagnosis of svt - vt  The various methods of treatment have been discussed with the patient and family. After consideration of risks, benefits and other options for treatment, the patient has consented to  Procedure(s): LOOP RECORDER INSERTION (N/A) as a surgical intervention .  The patient's history has been reviewed, patient examined, no change in status, stable for surgery.  I have reviewed the patient's chart and labs.  Questions were answered to the patient's satisfaction.     Averey Trompeter

## 2018-04-18 NOTE — Telephone Encounter (Signed)
Sophia [name] this came to me, sending back to you

## 2018-04-18 NOTE — Telephone Encounter (Signed)
It would have been nice to get this string of messages before doing the procedure. MCr

## 2018-04-25 ENCOUNTER — Ambulatory Visit (INDEPENDENT_AMBULATORY_CARE_PROVIDER_SITE_OTHER): Payer: Medicare HMO | Admitting: *Deleted

## 2018-04-25 DIAGNOSIS — I472 Ventricular tachycardia: Secondary | ICD-10-CM

## 2018-04-25 DIAGNOSIS — I4729 Other ventricular tachycardia: Secondary | ICD-10-CM

## 2018-04-25 LAB — CUP PACEART INCLINIC DEVICE CHECK
Implantable Pulse Generator Implant Date: 20191018
MDC IDC SESS DTM: 20191028154147

## 2018-04-25 NOTE — Progress Notes (Signed)
Wound check appointment. Steri-strips removed. Wound without redness or edema. Incision edges approximated, wound well healed. Battery status: GOOD. R-waves 0.19mV. 0 symptom episodes, 0 tachy episodes, 0 pause episodes, 0 brady episodes. 0 AF episodes (0% burden). Monthly summary reports and ROV with [facility] 08/03/18.

## 2018-04-26 ENCOUNTER — Encounter: Payer: Self-pay | Admitting: Family Medicine

## 2018-04-26 ENCOUNTER — Ambulatory Visit (INDEPENDENT_AMBULATORY_CARE_PROVIDER_SITE_OTHER): Payer: Medicare HMO | Admitting: Family Medicine

## 2018-04-26 ENCOUNTER — Ambulatory Visit (HOSPITAL_BASED_OUTPATIENT_CLINIC_OR_DEPARTMENT_OTHER)
Admission: RE | Admit: 2018-04-26 | Discharge: 2018-04-26 | Disposition: A | Discharge status: Home / Self Care | Payer: Medicare HMO | Source: Ambulatory Visit | Attending: Family Medicine | Admitting: Family Medicine

## 2018-04-26 VITALS — BP 120/72 | HR 68 | Temp 98.0°F | Resp 18 | Ht 60.0 in | Wt 138.6 lb

## 2018-04-26 DIAGNOSIS — I10 Essential (primary) hypertension: Secondary | ICD-10-CM

## 2018-04-26 DIAGNOSIS — I472 Ventricular tachycardia: Secondary | ICD-10-CM

## 2018-04-26 DIAGNOSIS — K5792 Diverticulitis of intestine, part unspecified, without perforation or abscess without bleeding: Secondary | ICD-10-CM | POA: Diagnosis not present

## 2018-04-26 DIAGNOSIS — M542 Cervicalgia: Secondary | ICD-10-CM

## 2018-04-26 DIAGNOSIS — Z79899 Other long term (current) drug therapy: Secondary | ICD-10-CM | POA: Diagnosis not present

## 2018-04-26 DIAGNOSIS — R739 Hyperglycemia, unspecified: Secondary | ICD-10-CM

## 2018-04-26 DIAGNOSIS — M50322 Other cervical disc degeneration at C5-C6 level: Secondary | ICD-10-CM | POA: Insufficient documentation

## 2018-04-26 DIAGNOSIS — F329 Major depressive disorder, single episode, unspecified: Secondary | ICD-10-CM

## 2018-04-26 DIAGNOSIS — F32A Depression, unspecified: Secondary | ICD-10-CM

## 2018-04-26 DIAGNOSIS — H93A9 Pulsatile tinnitus, unspecified ear: Secondary | ICD-10-CM | POA: Diagnosis not present

## 2018-04-26 DIAGNOSIS — F419 Anxiety disorder, unspecified: Secondary | ICD-10-CM

## 2018-04-26 DIAGNOSIS — E7800 Pure hypercholesterolemia, unspecified: Secondary | ICD-10-CM

## 2018-04-26 DIAGNOSIS — Z0000 Encounter for general adult medical examination without abnormal findings: Secondary | ICD-10-CM | POA: Diagnosis not present

## 2018-04-26 DIAGNOSIS — I4729 Other ventricular tachycardia: Secondary | ICD-10-CM

## 2018-04-26 DIAGNOSIS — C4492 Squamous cell carcinoma of skin, unspecified: Secondary | ICD-10-CM

## 2018-04-26 LAB — LIPID PANEL
Cholesterol: 203 mg/dL — ABNORMAL HIGH (ref 0–200)
HDL: 47.6 mg/dL (ref >39.00)
LDL Cholesterol: 120 mg/dL — ABNORMAL HIGH (ref 0–99)
NONHDL: 155.64
Total CHOL/HDL Ratio: 4
Triglycerides: 178 mg/dL — ABNORMAL HIGH (ref 0.0–149.0)
VLDL: 35.6 mg/dL (ref 0.0–40.0)

## 2018-04-26 LAB — COMPREHENSIVE METABOLIC PANEL
ALT: 33 U/L (ref 0–35)
AST: 25 U/L (ref 0–37)
Albumin: 4.3 g/dL (ref 3.5–5.2)
Alkaline Phosphatase: 80 U/L (ref 39–117)
BUN: 12 mg/dL (ref 6–23)
CHLORIDE: 104 meq/L (ref 96–112)
CO2: 27 mEq/L (ref 19–32)
Calcium: 9.7 mg/dL (ref 8.4–10.5)
Creatinine, Ser: 0.77 mg/dL (ref 0.40–1.20)
GFR: 77.48 mL/min (ref >60.00)
GLUCOSE: 84 mg/dL (ref 70–99)
Potassium: 4.6 mEq/L (ref 3.5–5.1)
SODIUM: 142 meq/L (ref 135–145)
Total Bilirubin: 0.4 mg/dL (ref 0.2–1.2)
Total Protein: 6.8 g/dL (ref 6.0–8.3)

## 2018-04-26 LAB — HEMOGLOBIN A1C: Hgb A1c MFr Bld: 5.8 % (ref 4.6–6.5)

## 2018-04-26 LAB — TSH: TSH: 1.32 u[IU]/mL (ref 0.35–4.50)

## 2018-04-26 MED ORDER — CIPROFLOXACIN HCL 500 MG PO TABS: 500.0000 mg | ORAL_TABLET | Freq: Two times a day (BID) | ORAL | 0 refills | Status: DC

## 2018-04-26 MED ORDER — ALPRAZOLAM 0.25 MG PO TABS: 0.2500 mg | ORAL_TABLET | Freq: Every day | ORAL | 1 refills | Status: DC | PRN

## 2018-04-26 MED ORDER — METRONIDAZOLE 500 MG PO TABS: 500.0000 mg | ORAL_TABLET | Freq: Three times a day (TID) | ORAL | 0 refills | Status: DC

## 2018-04-26 NOTE — Assessment & Plan Note (Signed)
Encouraged heart healthy diet, increase exercise, avoid trans fats, consider a krill oil cap daily. Does not tolerate statins so if numbers increasing may need to consider referral to lipid clinic patient is warned in this regard

## 2018-04-26 NOTE — Assessment & Plan Note (Signed)
Well controlled, no changes to meds. Encouraged heart healthy diet such as the DASH diet and exercise as tolerated.  °

## 2018-04-26 NOTE — Assessment & Plan Note (Signed)
Uses alprazolam infrequntly with good results. Allowed a refill and asked to check with her pharmacy regarding refill lot.

## 2018-04-26 NOTE — Assessment & Plan Note (Signed)
glucose of 109 at ER recently likely a stress reaction but will check hgba1c to further evaluate

## 2018-04-26 NOTE — Assessment & Plan Note (Signed)
Patient encouraged to maintain heart healthy diet, regular exercise, adequate sleep. Consider daily probiotics. Take medications as prescribed. ACP documents reviewed in chart. Labs reviewed and ordered.

## 2018-04-26 NOTE — Assessment & Plan Note (Signed)
Had a flare this week but with bland diet and rest it is resolving had a slight fever one day. Still very mild discomfort in RLQ. Given a refill on the Cipro and Flagyl she took previously with good results in case she worsens. She is scheduled to fly to [place] on Friday to see her brother who just had a stroke. If she worsens she will not be able to go.

## 2018-04-26 NOTE — Assessment & Plan Note (Signed)
On left is so severe it awakens her and causes her significant distress daily. Her healthy younger brother just had a stroke and with her tender over the occiput and ongoing and worsening tinnitus will proceed with MR to evaluate for circulation and mass concerns.

## 2018-04-26 NOTE — Assessment & Plan Note (Signed)
Now has a loop recorder in place and is following with cardiology Dr Orene Desanctis has a follow up appt in February. Has not had any further extreme events like the one that sent her to the ER.

## 2018-04-26 NOTE — Progress Notes (Signed)
Subjective:    Patient ID: Sophia [name], female    DOB: August 21, 1940, 77 y.o.   MRN: [id number]  No chief complaint on file.   HPI Patient is in today for annual preventative exam and follow-up on chronic medical concerns including NSVT, hyperlipidemia, HTN and more.  She is accompanied by the daughter she lives with.  She presented to the hospital on October 18 with near syncopal episode and was found to be in NSVT.  Now has a loop recorder in her chest and has had no further severe symptoms since that time.  They did double her carvedilol.  She was having palpitations shortness of breath and felt as if she was in a pass out.  She still has occasional short-lived palpitations but there are no associated symptoms.  She also had some chest pressure at that time but no longer has having that symptoms.  She has had a diverticular flare this week.  She reports some increase in the right lower quadrant pain in the past has been diverticulitis.  She had one day earlier in the week of a temp to 101 that has improved.  No chills.  She changed to a bland diet and increase to rest and her symptoms are largely resolved.  There is a slight amount of discomfort in the right lower quadrant presently.  No fevers or chills.  No malaise.  She is also noting still tender over her [name] occiput since April and a whooshing heartbeat type sound in her [name] ear and hears the palpitations in her ear when it occurs.  She has a morning headache daily but denies any snoring.  She has a brother who is just suffered a stroke who was previously in good health.  She is managing her activities of daily living well.  She has had a squamous cell carcinoma removed from behind her [name] ear and tolerated that well.  Past Medical History:  Diagnosis Date  . Anxiety and depression   . Arthritis    hands, knees, etc  . Bladder prolapse, female, acquired 09/16/2014  . Bowen's disease   . Diverticulitis   . Diverticulosis   . GERD  (gastroesophageal reflux disease)   . H/O hematuria    for several years, work only revealed kidney stone on right side  . History of chicken pox   . History of kidney stones   . Hyperlipidemia   . Hypertension   . Leg cramps, sleep related 04/02/2016  . Myalgia 04/25/2017  . Osteopenia 06/26/2014  . Osteoporosis 06/26/2014  . Plantar wart of [name] foot 10/15/2016  . Right shoulder pain 04/23/2017  . SCC (squamous cell carcinoma)    arms    Past Surgical History:  Procedure Laterality Date  . CHOLECYSTECTOMY  2005  . LOOP RECORDER INSERTION N/A 04/15/2018   Procedure: LOOP RECORDER INSERTION;  Surgeon: Sanda Klein, MD;  Location: [facility];  Service: Cardiovascular;  Laterality: N/A;  . SKIN BIOPSY     multiple    Family History  Problem Relation Age of Onset  . Heart disease Mother        aortic stenosis  . Hyperlipidemia Mother   . Hypertension Mother   . Heart attack Mother   . Cancer Mother        BCC, SCC, skin  . Hypertension Father   . Cancer Father        SCC, BCC, skin  . Stroke Brother   . Early death Daughter   .  Heart disease Maternal Grandfather        MI  . Tuberculosis Paternal Grandmother   . Cancer Paternal Grandfather        head and neck cancer, chewed tobacco  . Hypertension Daughter   . Heart disease Brother        arrythmia  . Hypertension Brother   . Hyperlipidemia Brother   . Birth defects Maternal Uncle     Social History   Socioeconomic History  . Marital status: Single    Spouse name: Not on file  . Number of children: Not on file  . Years of education: Not on file  . Highest education level: Not on file  Occupational History  . Not on file  Social Needs  . Financial resource strain: Not on file  . Food insecurity:    Worry: Not on file    Inability: Not on file  . Transportation needs:    Medical: Not on file    Non-medical: Not on file  Tobacco Use  . Smoking status: Never Smoker  . Smokeless tobacco:  Never Used  Substance and Sexual Activity  . Alcohol use: No    Alcohol/week: 0.0 standard drinks  . Drug use: No  . Sexual activity: Never    Comment: divorced, widowed, lives with elderly parents, is the caregiver, no dietary restrictions.   Lifestyle  . Physical activity:    Days per week: Not on file    Minutes per session: Not on file  . Stress: Not on file  Relationships  . Social connections:    Talks on phone: Not on file    Gets together: Not on file    Attends religious service: Not on file    Active member of club or organization: Not on file    Attends meetings of clubs or organizations: Not on file    Relationship status: Not on file  . Intimate partner violence:    Fear of current or ex partner: Not on file    Emotionally abused: Not on file    Physically abused: Not on file    Forced sexual activity: Not on file  Other Topics Concern  . Not on file  Social History Narrative  . Not on file    Outpatient Medications Prior to Visit  Medication Sig Dispense Refill  . acetaminophen (TYLENOL) 500 MG tablet Take 1,000 mg by mouth 2 (two) times daily as needed for moderate pain or headache.    [name] aspirin 81 MG tablet Take 81 mg by mouth every Monday, Wednesday, and Friday.     . B Complex-C (SUPER B COMPLEX) TABS Take 1 tablet by mouth daily.    . Calcium Carb-Cholecalciferol (CALCIUM 600 + D PO) Take 1 tablet by mouth daily.    . carvedilol (COREG) 12.5 MG tablet Take 1 tablet (12.5 mg total) by mouth 2 (two) times daily. 90 tablet 3  . Coenzyme Q10 (COQ-10) 200 MG CAPS Take 200 mg by mouth daily.     [name] estradiol (ESTRACE) 0.1 MG/GM vaginal cream Place 1 Applicatorful vaginally 2 (two) times a week.    . Hypromellose (ARTIFICIAL TEARS OP) Place 1 drop into both eyes 2 (two) times daily.    . Omega-3 Fatty Acids (FISH OIL) 1200 MG CAPS Take 1,200 mg by mouth 2 (two) times daily.     . Probiotic Product (PROBIOTIC DAILY PO) Take 1 capsule by mouth daily.     . vitamin  C (ASCORBIC ACID) 500 MG tablet  Take 500 mg by mouth daily.    [name] ALPRAZolam (XANAX) 0.25 MG tablet Take 1 tablet (0.25 mg total) by mouth daily as needed for anxiety or sleep. 30 tablet 1   No facility-administered medications prior to visit.     Allergies  Allergen Reactions  . Morphine And Related Swelling  . Statins Other (See Comments)    Myalgias, weakness  . Sulfa Antibiotics Swelling and Rash    Review of Systems  Constitutional: Positive for malaise/fatigue. Negative for chills and fever.  HENT: Positive for tinnitus. Negative for congestion, hearing loss, sinus pain and sore throat.   Eyes: Negative for discharge.  Respiratory: Negative for cough, sputum production and shortness of breath.   Cardiovascular: Positive for palpitations. Negative for chest pain and leg swelling.  Gastrointestinal: Positive for abdominal pain. Negative for blood in stool, constipation, diarrhea, heartburn, nausea and vomiting.  Genitourinary: Negative for dysuria, frequency, hematuria and urgency.  Musculoskeletal: Positive for neck pain. Negative for back pain, falls and myalgias.  Skin: Negative for rash.  Neurological: Positive for headaches. Negative for dizziness, sensory change, loss of consciousness and weakness.  Endo/Heme/Allergies: Negative for environmental allergies. Does not bruise/bleed easily.  Psychiatric/Behavioral: Negative for depression and suicidal ideas. The patient is nervous/anxious. The patient does not have insomnia.        Objective:    Physical Exam  BP 120/72 (BP Location: [name] Arm, Patient Position: Sitting, Cuff Size: Normal)   Pulse 68   Temp 98 F (36.7 C) (Oral)   Resp 18   Ht 5' (1.524 m)   Wt 138 lb 9.6 oz (62.9 kg)   SpO2 95%   BMI 27.07 kg/m  Wt Readings from Last 3 Encounters:  04/26/18 138 lb 9.6 oz (62.9 kg)  04/15/18 139 lb (63 kg)  04/12/18 139 lb 6.4 oz (63.2 kg)     Lab Results  Component Value Date   WBC 8.3 02/22/2018   HGB 12.9  02/22/2018   HCT 37.6 02/22/2018   PLT 167 02/22/2018   GLUCOSE 109 (H) 02/22/2018   CHOL 241 (H) 04/23/2017   TRIG 215.0 (H) 04/23/2017   HDL 53.80 04/23/2017   LDLDIRECT 150.0 04/23/2017   LDLCALC 130 (H) 03/12/2015   ALT 14 04/23/2017   AST 21 04/23/2017   NA 137 02/22/2018   K 4.2 02/22/2018   CL 102 02/22/2018   CREATININE 0.78 02/22/2018   BUN 11 02/22/2018   CO2 25 02/22/2018   TSH 1.22 04/23/2017    Lab Results  Component Value Date   TSH 1.22 04/23/2017   Lab Results  Component Value Date   WBC 8.3 02/22/2018   HGB 12.9 02/22/2018   HCT 37.6 02/22/2018   MCV 92.2 02/22/2018   PLT 167 02/22/2018   Lab Results  Component Value Date   NA 137 02/22/2018   K 4.2 02/22/2018   CO2 25 02/22/2018   GLUCOSE 109 (H) 02/22/2018   BUN 11 02/22/2018   CREATININE 0.78 02/22/2018   BILITOT 0.7 04/23/2017   ALKPHOS 62 04/23/2017   AST 21 04/23/2017   ALT 14 04/23/2017   PROT 7.1 04/23/2017   ALBUMIN 4.5 04/23/2017   CALCIUM 9.1 02/22/2018   ANIONGAP 10 02/22/2018   GFR 83.95 04/23/2017   Lab Results  Component Value Date   CHOL 241 (H) 04/23/2017   Lab Results  Component Value Date   HDL 53.80 04/23/2017   Lab Results  Component Value Date   LDLCALC 130 (H) 03/12/2015  Lab Results  Component Value Date   TRIG 215.0 (H) 04/23/2017   Lab Results  Component Value Date   CHOLHDL 4 04/23/2017   No results found for: HGBA1C     Assessment & Plan:   Problem List Items Addressed This Visit    Hypertension    Well controlled, no changes to meds. Encouraged heart healthy diet such as the DASH diet and exercise as tolerated.       Relevant Orders   Comprehensive metabolic panel   TSH   Hyperlipidemia    Encouraged heart healthy diet, increase exercise, avoid trans fats, consider a krill oil cap daily. Does not tolerate statins so if numbers increasing may need to consider referral to lipid clinic patient is warned in this regard      Relevant  Orders   Lipid panel   Anxiety and depression    Uses alprazolam infrequntly with good results. Allowed a refill and asked to check with her pharmacy regarding refill lot.       Relevant Medications   ALPRAZolam (XANAX) 0.25 MG tablet   SCC (squamous cell carcinoma)    Removed from [name] occiput. Follows with dermatology      Relevant Medications   ALPRAZolam (XANAX) 0.25 MG tablet   ciprofloxacin (CIPRO) 500 MG tablet   metroNIDAZOLE (FLAGYL) 500 MG tablet   Preventative health care - Primary    Patient encouraged to maintain heart healthy diet, regular exercise, adequate sleep. Consider daily probiotics. Take medications as prescribed. ACP documents reviewed in chart. Labs reviewed and ordered.       NSVT (nonsustained ventricular tachycardia) (HCC)    Now has a loop recorder in place and is following with cardiology Dr Orene Desanctis has a follow up appt in February. Has not had any further extreme events like the one that sent her to the ER.       Pulsatile tinnitus    On [name] is so severe it awakens her and causes her significant distress daily. Her healthy younger brother just had a stroke and with her tender over the occiput and ongoing and worsening tinnitus will proceed with MR to evaluate for circulation and mass concerns.       Relevant Orders   MR BRAIN W WO CONTRAST   Hyperglycemia    glucose of 109 at ER recently likely a stress reaction but will check hgba1c to further evaluate      Relevant Orders   Hemoglobin A1c   Diverticulitis    Had a flare this week but with bland diet and rest it is resolving had a slight fever one day. Still very mild discomfort in RLQ. Given a refill on the Cipro and Flagyl she took previously with good results in case she worsens. She is scheduled to fly to [facility] on Friday to see her brother who just had a stroke. If she worsens she will not be able to go.       Neck pain    Encouraged moist heat and gentle stretching as tolerated. May try  NSAIDs and prescription meds as directed and report if symptoms worsen or seek immediate care. Lidocaine gel prn      Relevant Orders   DG Cervical Spine Complete    Other Visit Diagnoses    High risk medication use       Relevant Orders   Pain Mgmt, Profile 8 w/Conf, U      I am having Sophia [name] maintain her aspirin, Fish Oil, CoQ-10,  SUPER B COMPLEX, Probiotic Product (PROBIOTIC DAILY PO), vitamin C, estradiol, carvedilol, Calcium Carb-Cholecalciferol (CALCIUM 600 + D PO), acetaminophen, Hypromellose (ARTIFICIAL TEARS OP), ALPRAZolam, ciprofloxacin, and metroNIDAZOLE.  Meds ordered this encounter  Medications  . ALPRAZolam (XANAX) 0.25 MG tablet    Sig: Take 1 tablet (0.25 mg total) by mouth daily as needed for anxiety or sleep.    Dispense:  30 tablet    Refill:  1  . ciprofloxacin (CIPRO) 500 MG tablet    Sig: Take 1 tablet (500 mg total) by mouth 2 (two) times daily.    Dispense:  20 tablet    Refill:  0  . metroNIDAZOLE (FLAGYL) 500 MG tablet    Sig: Take 1 tablet (500 mg total) by mouth 3 (three) times daily.    Dispense:  30 tablet    Refill:  0     Penni Homans, MD

## 2018-04-26 NOTE — Patient Instructions (Signed)
Preventive Care 76 Years and Older, Female Preventive care refers to lifestyle choices and visits with your health care provider that can promote health and wellness. What does preventive care include?  A yearly physical exam. This is also called an annual well check.  Dental exams once or twice a year.  Routine eye exams. Ask your health care provider how often you should have your eyes checked.  Personal lifestyle choices, including: ? Daily care of your teeth and gums. ? Regular physical activity. ? Eating a healthy diet. ? Avoiding tobacco and drug use. ? Limiting alcohol use. ? Practicing safe sex. ? Taking low-dose aspirin every day. ? Taking vitamin and mineral supplements as recommended by your health care provider. What happens during an annual well check? The services and screenings done by your health care provider during your annual well check will depend on your age, overall health, lifestyle risk factors, and family history of disease. Counseling Your health care provider may ask you questions about your:  Alcohol use.  Tobacco use.  Drug use.  Emotional well-being.  Home and relationship well-being.  Sexual activity.  Eating habits.  History of falls.  Memory and ability to understand (cognition).  Work and work environment.  Reproductive health.  Screening You may have the following tests or measurements:  Height, weight, and BMI.  Blood pressure.  Lipid and cholesterol levels. These may be checked every 5 years, or more frequently if you are over 50 years old.  Skin check.  Lung cancer screening. You may have this screening every year starting at age 55 if you have a 30-pack-year history of smoking and currently smoke or have quit within the past 15 years.  Fecal occult blood test (FOBT) of the stool. You may have this test every year starting at age 50.  Flexible sigmoidoscopy or colonoscopy. You may have a sigmoidoscopy every 5 years or  a colonoscopy every 10 years starting at age 50.  Hepatitis C blood test.  Hepatitis B blood test.  Sexually transmitted disease (STD) testing.  Diabetes screening. This is done by checking your blood sugar (glucose) after you have not eaten for a while (fasting). You may have this done every 1-3 years.  Bone density scan. This is done to screen for osteoporosis. You may have this done starting at age 65.  Mammogram. This may be done every 1-2 years. Talk to your health care provider about how often you should have regular mammograms.  Talk with your health care provider about your test results, treatment options, and if necessary, the need for more tests. Vaccines Your health care provider may recommend certain vaccines, such as:  Influenza vaccine. This is recommended every year.  Tetanus, diphtheria, and acellular pertussis (Tdap, Td) vaccine. You may need a Td booster every 10 years.  Varicella vaccine. You may need this if you have not been vaccinated.  Zoster vaccine. You may need this after age 60.  Measles, mumps, and rubella (MMR) vaccine. You may need at least one dose of MMR if you were born in 1957 or later. You may also need a second dose.  Pneumococcal 13-valent conjugate (PCV13) vaccine. One dose is recommended after age 65.  Pneumococcal polysaccharide (PPSV23) vaccine. One dose is recommended after age 65.  Meningococcal vaccine. You may need this if you have certain conditions.  Hepatitis A vaccine. You may need this if you have certain conditions or if you travel or work in places where you may be exposed to hepatitis   A.  Hepatitis B vaccine. You may need this if you have certain conditions or if you travel or work in places where you may be exposed to hepatitis B.  Haemophilus influenzae type b (Hib) vaccine. You may need this if you have certain conditions.  Talk to your health care provider about which screenings and vaccines you need and how often you  need them. This information is not intended to replace advice given to you by your health care provider. Make sure you discuss any questions you have with your health care provider. Document Released: 07/12/2015 Document Revised: 03/04/2016 Document Reviewed: 04/16/2015 Elsevier Interactive Patient Education  2018 Elsevier Inc.  

## 2018-04-26 NOTE — Assessment & Plan Note (Signed)
Removed from left occiput. Follows with dermatology

## 2018-04-26 NOTE — Assessment & Plan Note (Signed)
Encouraged moist heat and gentle stretching as tolerated. May try NSAIDs and prescription meds as directed and report if symptoms worsen or seek immediate care. Lidocaine gel prn

## 2018-04-27 ENCOUNTER — Ambulatory Visit: Payer: Medicare HMO

## 2018-04-27 LAB — PAIN MGMT, PROFILE 8 W/CONF, U
6 Acetylmorphine: NEGATIVE ng/mL (ref <10)
ALCOHOL METABOLITES: NEGATIVE ng/mL (ref <500)
AMPHETAMINES: NEGATIVE ng/mL (ref <500)
BENZODIAZEPINES: NEGATIVE ng/mL (ref <100)
BUPRENORPHINE, URINE: NEGATIVE ng/mL (ref <5)
Cocaine Metabolite: NEGATIVE ng/mL (ref <150)
Creatinine: 25.6 mg/dL
MDMA: NEGATIVE ng/mL (ref <500)
Marijuana Metabolite: NEGATIVE ng/mL (ref <20)
OXIDANT: NEGATIVE ug/mL (ref <200)
OXYCODONE: NEGATIVE ng/mL (ref <100)
Opiates: NEGATIVE ng/mL (ref <100)
pH: 6.42 (ref 4.5–9.0)

## 2018-04-28 ENCOUNTER — Telehealth: Payer: Self-pay

## 2018-04-28 NOTE — Telephone Encounter (Signed)
Please write her a letter that states she is advised not to travel of fly at this time due to recent onset cardiac dysrhythmia and diverticulitis infection

## 2018-04-28 NOTE — Telephone Encounter (Signed)
Copied from [place] [id number]. Topic: General - Inquiry >> Apr 27, 2018 10:39 AM Scherrie Gerlach wrote: Reason for CRM: pt saw Dr Charlett Blake yesterday and was advised not to take a flight she was scheduled for. Pt states she DOES need the letter of medical advice not to fly at this time.  Pt needs to get her money back for the flight.  Pt would like you to put on Mychart if possible. If not, please let her know best way to get the letter.    Please advise

## 2018-04-28 NOTE — Telephone Encounter (Signed)
Sent letter via my chart.

## 2018-04-29 ENCOUNTER — Telehealth: Payer: Self-pay | Admitting: Cardiovascular Disease

## 2018-04-29 NOTE — Telephone Encounter (Signed)
Noted  

## 2018-04-29 NOTE — Telephone Encounter (Signed)
Just an FYI for you. Thanks!   

## 2018-04-29 NOTE — Telephone Encounter (Signed)
Has ILR, getting MRI. Please disregard the noise

## 2018-04-29 NOTE — Telephone Encounter (Signed)
New Message:     Pt is having a MRI tomorrow and was told to call and notify Dr C. She said that all she was told to be sure to let him know.[name]

## 2018-04-30 ENCOUNTER — Ambulatory Visit (HOSPITAL_BASED_OUTPATIENT_CLINIC_OR_DEPARTMENT_OTHER)
Admission: RE | Admit: 2018-04-30 | Discharge: 2018-04-30 | Disposition: A | Discharge status: Home / Self Care | Payer: Medicare HMO | Source: Ambulatory Visit | Attending: Family Medicine | Admitting: Family Medicine

## 2018-04-30 DIAGNOSIS — H93A9 Pulsatile tinnitus, unspecified ear: Secondary | ICD-10-CM | POA: Diagnosis not present

## 2018-04-30 MED ORDER — GADOBENATE DIMEGLUMINE 529 MG/ML IV SOLN
10.0000 mL | Freq: Once | INTRAVENOUS | Status: AC | PRN
  Administered 2018-04-30: 10 mL via INTRAVENOUS

## 2018-05-18 ENCOUNTER — Ambulatory Visit (INDEPENDENT_AMBULATORY_CARE_PROVIDER_SITE_OTHER): Payer: Medicare HMO

## 2018-05-18 DIAGNOSIS — R55 Syncope and collapse: Secondary | ICD-10-CM | POA: Diagnosis not present

## 2018-05-19 NOTE — Progress Notes (Signed)
Carelink Summary Report / Loop Recorder 

## 2018-05-30 ENCOUNTER — Telehealth: Payer: Self-pay | Admitting: Cardiovascular Disease

## 2018-05-30 NOTE — Telephone Encounter (Signed)
Please make sure she is not missing any carvedilol doses (explain risk of rebound HTN and rebound tachycardia with abrupt interruption of beta blockers). Since both HR and BP were up, sounds like an adrenergically driven event. If it happens again, can consider an event monitor.

## 2018-05-30 NOTE — Telephone Encounter (Signed)
Pt updated with recommendation. Pt currently wearing loop recorder.

## 2018-05-30 NOTE — Telephone Encounter (Signed)
Spoke with pt who states around 630 am she started felling like her heart was racing, checked her BP 148/107 HR 110. She took her morning dose of Coreg and checked again. BP 153/107 HR 105. Pt states she started feeling better an hour after taking her medication. BP now 130/79 HR 72. Will route to MD to make aware.

## 2018-05-30 NOTE — Telephone Encounter (Signed)
New Message  Patient c/o Palpitations:  High priority if patient c/o lightheadedness, shortness of breath, or chest pain  1) How long have you had palpitations/irregular HR/ Afib? Are you having the symptoms now? Started at 6:30 this morning  2) Are you currently experiencing lightheadedness, SOB or CP? A little chest tightness but no pain  3) Do you have a history of afib (atrial fibrillation) or irregular heart rhythm? Yes  4) Have you checked your BP or HR? (document readings if available): yes at 6:30 this morning its was 137/106 hr 110, 30 minutes after taking her carvedilol  -153/107 hr 105, 1 hour after meds-131/36 hr 113 and at 11:30-130/79 hr 72  5) Are you experiencing any other symptoms? no

## 2018-05-31 NOTE — Telephone Encounter (Signed)
LINQ noted AF 05/30/18 at 6:57am for 2 hours 48 minutes, Avg V rate 98 bpm.

## 2018-06-01 NOTE — Telephone Encounter (Signed)
I agree that it looks like atrial fibrillation, but the episode was short and would not be expected to cause near-syncope (which was the original reason for implanting the device).   I am not sure that there is enough prove he had to start anticoagulation.  Will defer that decision to Dr. Gwenlyn Found.

## 2018-06-03 NOTE — Telephone Encounter (Signed)
Agree with Dr. Sallyanne Kuster

## 2018-06-20 ENCOUNTER — Ambulatory Visit (INDEPENDENT_AMBULATORY_CARE_PROVIDER_SITE_OTHER): Payer: Medicare HMO

## 2018-06-20 DIAGNOSIS — R55 Syncope and collapse: Secondary | ICD-10-CM | POA: Diagnosis not present

## 2018-06-21 LAB — CUP PACEART REMOTE DEVICE CHECK
Date Time Interrogation Session: 20191223173723
MDC IDC PG IMPLANT DT: 20191018

## 2018-06-21 NOTE — Progress Notes (Signed)
Carelink Summary Report / Loop Recorder 

## 2018-07-03 LAB — CUP PACEART REMOTE DEVICE CHECK
MDC IDC PG IMPLANT DT: 20191018
MDC IDC SESS DTM: 20191120170953

## 2018-07-25 ENCOUNTER — Ambulatory Visit (INDEPENDENT_AMBULATORY_CARE_PROVIDER_SITE_OTHER): Payer: Medicare HMO

## 2018-07-25 DIAGNOSIS — R55 Syncope and collapse: Secondary | ICD-10-CM | POA: Diagnosis not present

## 2018-07-26 LAB — CUP PACEART REMOTE DEVICE CHECK
Date Time Interrogation Session: 20200125184043
MDC IDC PG IMPLANT DT: 20191018

## 2018-07-26 NOTE — Progress Notes (Signed)
Carelink Summary Report / Loop Recorder 

## 2018-08-03 ENCOUNTER — Ambulatory Visit: Payer: Medicare HMO | Admitting: Cardiovascular Disease

## 2018-08-03 ENCOUNTER — Encounter: Payer: Self-pay | Admitting: Cardiovascular Disease

## 2018-08-03 VITALS — BP 148/94 | HR 69 | Ht 60.0 in | Wt 138.8 lb

## 2018-08-03 DIAGNOSIS — I480 Paroxysmal atrial fibrillation: Secondary | ICD-10-CM

## 2018-08-03 DIAGNOSIS — R030 Elevated blood-pressure reading, without diagnosis of hypertension: Secondary | ICD-10-CM

## 2018-08-03 DIAGNOSIS — I472 Ventricular tachycardia: Secondary | ICD-10-CM

## 2018-08-03 DIAGNOSIS — I4729 Other ventricular tachycardia: Secondary | ICD-10-CM

## 2018-08-03 DIAGNOSIS — R55 Syncope and collapse: Secondary | ICD-10-CM

## 2018-08-03 NOTE — Progress Notes (Addendum)
Cardiology Office Note:    Date:  08/03/2018   ID:  Sophia [name], DOB 10-06-1940, MRN [id number]  PCP:  Mosie Lukes, MD  Cardiologist:  Quay Burow, MD  Electrophysiologist:  None   Referring MD: Mosie Lukes, MD   Chief Complaint  Patient presents with  . Atrial Fibrillation    History of Present Illness:    Sophia [name] is a 78 y.o. female with a hx of presyncope, nonsustained ventricular tachycardia and atrial tachycardia, for which she received an implantable loop recorder.  She has not had syncope since loop recorder implantation, but had a prolonged episode of paroxysmal atrial fibrillation for about 3 hours.  She was symptomatic.  She felt nauseous and very weak.  She could tell that her heart was racing.  She took her usual dose of carvedilol earlier and after about an hour she began to feel better.  She was quite tachycardic earlier along with ventricular rates as high as 198 bpm, but on the average her ventricular rate was 120-140.  She has not missed any doses of beta-blocker.  Otherwise she has felt well.The patient specifically denies any chest pain at rest exertion, dyspnea at rest or with exertion, orthopnea, paroxysmal nocturnal dyspnea, syncope, focal neurological deficits, intermittent claudication, lower extremity edema, unexplained weight gain, cough, hemoptysis or wheezing.  She has a history of hyperlipidemia but has not tolerated statins.  She has a family history of premature onset coronary disease in a first-degree female relatives.  Her recent echocardiogram does not show significant structural heart disease.  LVEF is normal. She had a low risk nuclear myocardial perfusion study.  She does not have carotid stenosis on ultrasound studies.  She has had previous problems with chest pain, but none recently and the work-up so far has been negative.  She has frequent palpitations and complains of a rushing sensation in her ear with each heartbeat.  This is  positional.  She is a Restaurant manager, fast food.   Past Medical History:  Diagnosis Date  . Anxiety and depression   . Arthritis    hands, knees, etc  . Bladder prolapse, female, acquired 09/16/2014  . Bowen's disease   . Diverticulitis   . Diverticulosis   . GERD (gastroesophageal reflux disease)   . H/O hematuria    for several years, work only revealed kidney stone on right side  . History of chicken pox   . History of kidney stones   . Hyperlipidemia   . Hypertension   . Leg cramps, sleep related 04/02/2016  . Myalgia 04/25/2017  . Osteopenia 06/26/2014  . Osteoporosis 06/26/2014  . Plantar wart of left foot 10/15/2016  . Right shoulder pain 04/23/2017  . SCC (squamous cell carcinoma)    arms    Past Surgical History:  Procedure Laterality Date  . CHOLECYSTECTOMY  2005  . LOOP RECORDER INSERTION N/A 04/15/2018   Procedure: LOOP RECORDER INSERTION;  Surgeon: Sanda Klein, MD;  Location: [facility];  Service: Cardiovascular;  Laterality: N/A;  . SKIN BIOPSY     multiple    Current Medications: Current Meds  Medication Sig  . MAGNESIUM GLYCINATE PLUS PO Take 225 mg by mouth daily.     Allergies:   Morphine and related; Statins; and Sulfa antibiotics   Social History   Socioeconomic History  . Marital status: Single    Spouse name: Not on file  . Number of children: Not on file  . Years of education: Not on file  .  Highest education level: Not on file  Occupational History  . Not on file  Social Needs  . Financial resource strain: Not on file  . Food insecurity:    Worry: Not on file    Inability: Not on file  . Transportation needs:    Medical: Not on file    Non-medical: Not on file  Tobacco Use  . Smoking status: Never Smoker  . Smokeless tobacco: Never Used  Substance and Sexual Activity  . Alcohol use: No    Alcohol/week: 0.0 standard drinks  . Drug use: No  . Sexual activity: Never    Comment: divorced, widowed, lives with elderly  parents, is the caregiver, no dietary restrictions.   Lifestyle  . Physical activity:    Days per week: Not on file    Minutes per session: Not on file  . Stress: Not on file  Relationships  . Social connections:    Talks on phone: Not on file    Gets together: Not on file    Attends religious service: Not on file    Active member of club or organization: Not on file    Attends meetings of clubs or organizations: Not on file    Relationship status: Not on file  Other Topics Concern  . Not on file  Social History Narrative  . Not on file     Family History: The patient's family history includes Birth defects in her maternal uncle; Cancer in her father, mother, and paternal grandfather; Early death in her daughter; Heart attack in her mother; Heart disease in her brother, maternal grandfather, and mother; Hyperlipidemia in her brother and mother; Hypertension in her brother, daughter, father, and mother; Stroke in her brother; Tuberculosis in her paternal grandmother.  ROS:   Please see the history of present illness.     All other systems reviewed and are negative.  EKGs/Labs/Other Studies Reviewed:    The following studies were reviewed today: Comprehensive loop recorder download  EKG:  EKG is not ordered today.    Recent Labs: 02/22/2018: Hemoglobin 12.9; Platelets 167 04/26/2018: ALT 33; BUN 12; Creatinine, Ser 0.77; Potassium 4.6; Sodium 142; TSH 1.32  Recent Lipid Panel    Component Value Date/Time   CHOL 203 (H) 04/26/2018 1156   TRIG 178.0 (H) 04/26/2018 1156   HDL 47.60 04/26/2018 1156   CHOLHDL 4 04/26/2018 1156   VLDL 35.6 04/26/2018 1156   LDLCALC 120 (H) 04/26/2018 1156   LDLDIRECT 150.0 04/23/2017 1035    April 26, 2018 TSH 1.32, K 4.6, creatinine 0.8, hemoglobin 12.9, cholesterol 293, HDL 47, LDL 120, triglycerides 178, platelets 167000.  Physical Exam:    VS:  BP (!) 148/94   Pulse 69   Ht 5' (1.524 m)   Wt 138 lb 12.8 oz (63 kg)   BMI 27.11  kg/m     Wt Readings from Last 3 Encounters:  08/03/18 138 lb 12.8 oz (63 kg)  04/26/18 138 lb 9.6 oz (62.9 kg)  04/15/18 139 lb (63 kg)      General: Alert, oriented x3, no distress, well-healed parasternal loop recorder site Head: no evidence of trauma, PERRL, EOMI, no exophtalmos or lid lag, no myxedema, no xanthelasma; normal ears, nose and oropharynx Neck: normal jugular venous pulsations and no hepatojugular reflux; brisk carotid pulses without delay and no carotid bruits Chest: clear to auscultation, no signs of consolidation by percussion or palpation, normal fremitus, symmetrical and full respiratory excursions Cardiovascular: normal position and quality of the apical  impulse, regular rhythm, normal first and second heart sounds, no murmurs, rubs or gallops Abdomen: no tenderness or distention, no masses by palpation, no abnormal pulsatility or arterial bruits, normal bowel sounds, no hepatosplenomegaly Extremities: no clubbing, cyanosis or edema; 2+ radial, ulnar and brachial pulses bilaterally; 2+ right femoral, posterior tibial and dorsalis pedis pulses; 2+ left femoral, posterior tibial and dorsalis pedis pulses; no subclavian or femoral bruits Neurological: grossly nonfocal Psych: Normal mood and affect   ASSESSMENT:    1. Paroxysmal atrial fibrillation (HCC)   2. Near syncope   3. NSVT (nonsustained ventricular tachycardia) (HCC)   4. Elevated BP without diagnosis of hypertension    PLAN:    In order of problems listed above:  1. AFib: Symptomatic with rapid ventricular response.  We discussed implications regarding the risk of embolic stroke.  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  She has a history of diverticulitis, but has never had diverticular bleeding.  She has a known kidney stone that is asymptomatic at this time and has not caused hematuria.  We reviewed the relative risks and  benefits of anticoagulants and I think in her case anticoagulants are clearly statistically beneficial.  CHADSVasc 3 (gender, age 6). We briefly reviewed the alternative treatment such as a watchman device, but I think she would do well with long-term anticoagulants.  She has a history of easily upset stomach with medications and will avoid Pradaxa.  Due to concern for GI bleeding prefer Eliquis over Xarelto.  Will start Eliquis 5 mg BID.  She is very slender under 140 pounds, but has not yet reached age 9 and has normal renal function.  When she reaches 78 years old due to to be appropriate to switch her to a 2.5 mg twice daily dose of Eliquis. 2. Near-syncope: This has not recurred since the loop recorder has been implanted.  No clear explanation yet, but is suspected to be arrhythmic. 3. NSVT: Brief episodes recorded on event monitor, concerning for possible arrhythmic cause of her syncope, but without clear association between the arrhythmia and symptoms.  No significant ventricular tachycardia recorded since loop recorder implantation 3 months ago. 4. I think today's blood pressure elevation is situational.  She checks her blood pressure at home it is typically in the 120/60-140/80 range.  She is clearly upset about the need to take anticoagulants.   Medication Adjustments/Labs and Tests Ordered: Current medicines are reviewed at length with the patient today.  Concerns regarding medicines are outlined above.  No orders of the defined types were placed in this encounter.  No orders of the defined types were placed in this encounter.   Patient Instructions  Medication Instructions:   Start Eliquis 5 mg twice a day.  If you need a refill on your cardiac medications before your next appointment, please call your pharmacy.   Lab work: None ordered  Testing/Procedures: None orderd  Follow-Up: At [facility], you and your health needs are our priority.  As part of our continuing mission  to provide you with exceptional heart care, we have created designated Provider Care Teams.  These Care Teams include your primary Cardiologist (physician) and Advanced Practice Providers (APPs -  Physician Assistants and Nurse Practitioners) who all work together to provide you with the care you need, when you need it.  . Follow Up appointment with Dr.Silver Parkey  Tuesday 10/25/18 at 10:40 am      Signed,  Sanda Klein, MD  08/03/2018 3:52 PM    [facility]

## 2018-08-03 NOTE — Patient Instructions (Addendum)
Medication Instructions:   Start Eliquis 5 mg twice a day.  If you need a refill on your cardiac medications before your next appointment, please call your pharmacy.   Lab work: None ordered  Testing/Procedures: None orderd  Follow-Up: At [facility], you and your health needs are our priority.  As part of our continuing mission to provide you with exceptional heart care, we have created designated Provider Care Teams.  These Care Teams include your primary Cardiologist (physician) and Advanced Practice Providers (APPs -  Physician Assistants and Nurse Practitioners) who all work together to provide you with the care you need, when you need it.  . Follow Up appointment with Dr.Croitoru  Tuesday 10/25/18 at 10:40 am

## 2018-08-08 ENCOUNTER — Ambulatory Visit (INDEPENDENT_AMBULATORY_CARE_PROVIDER_SITE_OTHER): Payer: Medicare HMO | Admitting: Family Medicine

## 2018-08-08 VITALS — BP 138/80 | HR 69 | Temp 97.8°F | Resp 18 | Wt 139.2 lb

## 2018-08-08 DIAGNOSIS — I10 Essential (primary) hypertension: Secondary | ICD-10-CM | POA: Diagnosis not present

## 2018-08-08 DIAGNOSIS — I4891 Unspecified atrial fibrillation: Secondary | ICD-10-CM | POA: Insufficient documentation

## 2018-08-08 DIAGNOSIS — R197 Diarrhea, unspecified: Secondary | ICD-10-CM | POA: Diagnosis not present

## 2018-08-08 DIAGNOSIS — R739 Hyperglycemia, unspecified: Secondary | ICD-10-CM

## 2018-08-08 DIAGNOSIS — E7800 Pure hypercholesterolemia, unspecified: Secondary | ICD-10-CM

## 2018-08-08 DIAGNOSIS — I7300 Raynaud's syndrome without gangrene: Secondary | ICD-10-CM | POA: Diagnosis not present

## 2018-08-08 DIAGNOSIS — I480 Paroxysmal atrial fibrillation: Secondary | ICD-10-CM | POA: Insufficient documentation

## 2018-08-08 LAB — COMPREHENSIVE METABOLIC PANEL WITH GFR
ALT: 17 U/L (ref 0–35)
AST: 21 U/L (ref 0–37)
Albumin: 4.4 g/dL (ref 3.5–5.2)
Alkaline Phosphatase: 68 U/L (ref 39–117)
BUN: 11 mg/dL (ref 6–23)
CO2: 27 meq/L (ref 19–32)
Calcium: 9.6 mg/dL (ref 8.4–10.5)
Chloride: 102 meq/L (ref 96–112)
Creatinine, Ser: 0.81 mg/dL (ref 0.40–1.20)
GFR: 68.71 mL/min
Glucose, Bld: 91 mg/dL (ref 70–99)
Potassium: 5 meq/L (ref 3.5–5.1)
Sodium: 139 meq/L (ref 135–145)
Total Bilirubin: 0.7 mg/dL (ref 0.2–1.2)
Total Protein: 6.8 g/dL (ref 6.0–8.3)

## 2018-08-08 LAB — CBC WITH DIFFERENTIAL/PLATELET
Basophils Absolute: 0 10*3/uL (ref 0.0–0.1)
Basophils Relative: 0.3 % (ref 0.0–3.0)
Eosinophils Absolute: 0.1 10*3/uL (ref 0.0–0.7)
Eosinophils Relative: 0.8 % (ref 0.0–5.0)
HCT: 41 % (ref 36.0–46.0)
Hemoglobin: 13.6 g/dL (ref 12.0–15.0)
Lymphocytes Relative: 33.5 % (ref 12.0–46.0)
Lymphs Abs: 2.6 10*3/uL (ref 0.7–4.0)
MCHC: 33 g/dL (ref 30.0–36.0)
MCV: 94.8 fl (ref 78.0–100.0)
Monocytes Absolute: 0.5 10*3/uL (ref 0.1–1.0)
Monocytes Relative: 6.6 % (ref 3.0–12.0)
NEUTROS PCT: 58.8 % (ref 43.0–77.0)
Neutro Abs: 4.6 10*3/uL (ref 1.4–7.7)
Platelets: 172 10*3/uL (ref 150.0–400.0)
RBC: 4.33 Mil/uL (ref 3.87–5.11)
RDW: 14.1 % (ref 11.5–15.5)
WBC: 7.9 10*3/uL (ref 4.0–10.5)

## 2018-08-08 NOTE — Assessment & Plan Note (Signed)
Was captured with holter monitor and she is tolerating Eliquis has stopped numerous supplements to decrease her risk of bleeding. As a Jehovah's Witness she is aware of the risk and chooses to proceed with Eliquis

## 2018-08-08 NOTE — Progress Notes (Signed)
Subjective:    Patient ID: Sophia [name], female    DOB: 02/27/1941, 78 y.o.   MRN: [id number]  No chief complaint on file.   HPI Patient is in today for follow-up accompanied by her daughter.  She did have a recent Holter monitor placed by cardiology which did capture atrial fibrillation.  They have started her on Eliquis which she is tolerating well.  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  No bloody or tarry stool although she is noting some diarrhea with each meal lately.  She also notes a longstanding history of her fingertips and her toes turning purple and white when they are cold Denies CP/palp/SOB/HA/congestion/fevers/GI or GU c/o. Taking meds as prescribed  Past Medical History:  Diagnosis Date  . Anxiety and depression   . Arthritis    hands, knees, etc  . Bladder prolapse, female, acquired 09/16/2014  . Bowen's disease   . Diverticulitis   . Diverticulosis   . GERD (gastroesophageal reflux disease)   . H/O hematuria    for several years, work only revealed kidney stone on right side  . History of chicken pox   . History of kidney stones   . Hyperlipidemia   . Hypertension   . Leg cramps, sleep related 04/02/2016  . Myalgia 04/25/2017  . Osteopenia 06/26/2014  . Osteoporosis 06/26/2014  . Plantar wart of [name] foot 10/15/2016  . Right shoulder pain 04/23/2017  . SCC (squamous cell carcinoma)    arms    Past Surgical History:  Procedure Laterality Date  . CHOLECYSTECTOMY  2005  . LOOP RECORDER INSERTION N/A 04/15/2018   Procedure: LOOP RECORDER INSERTION;  Surgeon: Sanda Klein, MD;  Location: [place];  Service: Cardiovascular;  Laterality: N/A;  . SKIN BIOPSY     multiple    Family History  Problem Relation Age of Onset  . Heart disease Mother        aortic stenosis  . Hyperlipidemia Mother   . Hypertension Mother   . Heart attack Mother   .  Cancer Mother        BCC, SCC, skin  . Hypertension Father   . Cancer Father        SCC, BCC, skin  . Stroke Brother   . Early death Daughter   . Heart disease Maternal Grandfather        MI  . Tuberculosis Paternal Grandmother   . Cancer Paternal Grandfather        head and neck cancer, chewed tobacco  . Hypertension Daughter   . Heart disease Brother        arrythmia  . Hypertension Brother   . Hyperlipidemia Brother   . Birth defects Maternal Uncle     Social History   Socioeconomic History  . Marital status: Single    Spouse name: Not on file  . Number of children: Not on file  . Years of education: Not on file  . Highest education level: Not on file  Occupational History  . Not on file  Social Needs  . Financial resource strain: Not on file  . Food insecurity:    Worry: Not on file    Inability: Not on file  . Transportation needs:    Medical: Not on file    Non-medical: Not on file  Tobacco Use  . Smoking status: Never Smoker  . Smokeless tobacco: Never Used  Substance and Sexual Activity  . Alcohol use: No    Alcohol/week: 0.0 standard drinks  . Drug use: No  . Sexual activity: Never    Comment: divorced, widowed, lives with elderly parents, is the caregiver, no dietary restrictions.   Lifestyle  . Physical activity:    Days per week: Not on file    Minutes per session: Not on file  . Stress: Not on file  Relationships  . Social connections:    Talks on phone: Not on file    Gets together: Not on file    Attends religious service: Not on file    Active member of club or organization: Not on file    Attends meetings of clubs or organizations: Not on file    Relationship status: Not on file  . Intimate partner violence:    Fear of current or ex partner: Not on file    Emotionally abused: Not on file    Physically abused: Not on file    Forced sexual activity: Not on file  Other Topics Concern  . Not on file  Social History Narrative  . Not on  file    Outpatient Medications Prior to Visit  Medication Sig Dispense Refill  . acetaminophen (TYLENOL) 500 MG tablet Take 1,000 mg by mouth 2 (two) times daily as needed for moderate pain or headache.    . ALPRAZolam (XANAX) 0.25 MG tablet Take 1 tablet (0.25 mg total) by mouth daily as needed for anxiety or sleep. 30 tablet 1  . B Complex-C (SUPER B COMPLEX) TABS Take 1 tablet by mouth daily.    . Calcium Carb-Cholecalciferol (CALCIUM 600 + D PO) Take 1 tablet by mouth daily.    . carvedilol (COREG) 12.5 MG tablet Take 1 tablet (12.5 mg total) by mouth 2 (two) times daily. 90 tablet 3  . estradiol (ESTRACE) 0.1 MG/GM vaginal cream Place 1 Applicatorful vaginally 2 (two) times a week.    . Hypromellose (ARTIFICIAL TEARS OP) Place 1 drop into both eyes 2 (two) times daily.    . Probiotic Product (PROBIOTIC DAILY PO) Take 1 capsule by mouth daily.     [name] aspirin 81 MG tablet Take 81 mg by mouth every Monday, Wednesday, and Friday.     . Coenzyme Q10 (COQ-10) 200 MG CAPS Take 200 mg by mouth daily.     [name] MAGNESIUM GLYCINATE PLUS PO Take 225 mg by mouth daily.    . Omega-3 Fatty Acids (FISH OIL) 1200 MG CAPS Take 1,200 mg by mouth 2 (two) times daily.     . vitamin C (ASCORBIC ACID) 500 MG tablet Take 500 mg by mouth daily.    . ciprofloxacin (CIPRO) 500 MG tablet Take 1 tablet (500 mg total) by mouth 2 (two) times daily. 20 tablet 0  . metroNIDAZOLE (FLAGYL) 500 MG tablet Take 1 tablet (500 mg total) by mouth 3 (three) times daily. 30 tablet 0   No facility-administered medications prior to visit.     Allergies  Allergen Reactions  . Morphine And Related Swelling  . Statins Other (See Comments)    Myalgias, weakness  . Sulfa Antibiotics Swelling and Rash    ROS     Objective:    Physical Exam Vitals signs and nursing note reviewed.  Constitutional:      General: She is not in acute distress.    Appearance: She is well-developed.  HENT:     Head: Normocephalic and atraumatic.       Right  Ear: Tympanic membrane, ear canal and external ear normal.     [name] Ear: Tympanic membrane, ear canal and external ear normal.     Nose: Nose normal.  Eyes:     General:        Right eye: No discharge.        [name] eye: No discharge.  Neck:     Musculoskeletal: Normal range of motion and neck supple.  Cardiovascular:     Rate and Rhythm: Normal rate and regular rhythm.     Heart sounds: No murmur.  Pulmonary:     Effort: Pulmonary effort is normal.     Breath sounds: Normal breath sounds.  Abdominal:     General: Bowel sounds are normal.     Palpations: Abdomen is soft.     Tenderness: There is no abdominal tenderness.  Skin:    General: Skin is warm and dry.  Neurological:     Mental Status: She is alert and oriented to person, place, and time.     BP 138/80 (BP Location: [name] Arm, Patient Position: Sitting, Cuff Size: Normal)   Pulse 69   Temp 97.8 F (36.6 C) (Oral)   Resp 18   Wt 139 lb 3.2 oz (63.1 kg)   SpO2 97%   BMI 27.19 kg/m  Wt Readings from Last 3 Encounters:  08/08/18 139 lb 3.2 oz (63.1 kg)  08/03/18 138 lb 12.8 oz (63 kg)  04/26/18 138 lb 9.6 oz (62.9 kg)     Lab Results  Component Value Date   WBC 8.3 02/22/2018   HGB 12.9 02/22/2018   HCT 37.6 02/22/2018   PLT 167 02/22/2018   GLUCOSE 84 04/26/2018   CHOL 203 (H) 04/26/2018   TRIG 178.0 (H) 04/26/2018   HDL 47.60 04/26/2018   LDLDIRECT 150.0 04/23/2017   LDLCALC 120 (H) 04/26/2018   ALT 33 04/26/2018   AST 25 04/26/2018   NA 142 04/26/2018   K 4.6 04/26/2018   CL 104 04/26/2018   CREATININE 0.77 04/26/2018   BUN 12 04/26/2018   CO2 27 04/26/2018   TSH 1.32 04/26/2018   HGBA1C 5.8 04/26/2018    Lab Results  Component Value Date   TSH 1.32 04/26/2018   Lab Results  Component Value Date   WBC 8.3 02/22/2018   HGB 12.9 02/22/2018   HCT 37.6 02/22/2018   MCV 92.2 02/22/2018   PLT 167 02/22/2018   Lab Results  Component Value Date   NA 142 04/26/2018   K 4.6  04/26/2018   CO2 27 04/26/2018   GLUCOSE 84 04/26/2018   BUN 12 04/26/2018   CREATININE 0.77 04/26/2018   BILITOT 0.4 04/26/2018   ALKPHOS 80 04/26/2018   AST 25 04/26/2018   ALT 33 04/26/2018   PROT 6.8 04/26/2018   ALBUMIN 4.3 04/26/2018   CALCIUM 9.7 04/26/2018   ANIONGAP 10 02/22/2018   GFR 77.48 04/26/2018   Lab Results  Component Value Date   CHOL 203 (H) 04/26/2018   Lab Results  Component Value Date   HDL 47.60 04/26/2018   Lab Results  Component Value Date   LDLCALC 120 (H) 04/26/2018   Lab Results  Component Value Date   TRIG 178.0 (H) 04/26/2018   Lab Results  Component Value Date   CHOLHDL 4 04/26/2018   Lab Results  Component Value Date   HGBA1C 5.8 04/26/2018       Assessment & Plan:   Problem List Items Addressed This Visit    Hypertension  Well controlled, no changes to meds. Encouraged heart healthy diet such as the DASH diet and exercise as tolerated.       Relevant Orders   Comprehensive metabolic panel   Hyperlipidemia    Encouraged heart healthy diet, increase exercise, avoid trans fats, consider a krill oil cap daily      Hyperglycemia    hgba1c acceptable, minimize simple carbs. Increase exercise as tolerated.      Diarrhea - Primary    Watery but never bloody. She notes it started after starting Magnesium Glycinate. Will stop this along with numerous supplements and we will see if that helps will run cultures if it does not improve      Relevant Orders   CBC w/Diff   Atrial fibrillation (HCC)    Was captured with holter monitor and she is tolerating Eliquis has stopped numerous supplements to decrease her risk of bleeding. As a Jehovah's Witness she is aware of the risk and chooses to proceed with Eliquis         I have discontinued Katharine Look L. Dario's aspirin, Fish Oil, CoQ-10, vitamin C, ciprofloxacin, metroNIDAZOLE, and MAGNESIUM GLYCINATE PLUS PO. I am also having her maintain her SUPER B COMPLEX, Probiotic Product  (PROBIOTIC DAILY PO), estradiol, carvedilol, Calcium Carb-Cholecalciferol (CALCIUM 600 + D PO), acetaminophen, Hypromellose (ARTIFICIAL TEARS OP), and ALPRAZolam.  No orders of the defined types were placed in this encounter.    Penni Homans, MD

## 2018-08-08 NOTE — Assessment & Plan Note (Signed)
Well controlled, no changes to meds. Encouraged heart healthy diet such as the DASH diet and exercise as tolerated.  °

## 2018-08-08 NOTE — Assessment & Plan Note (Signed)
Encouraged heart healthy diet, increase exercise, avoid trans fats, consider a krill oil cap daily 

## 2018-08-08 NOTE — Assessment & Plan Note (Signed)
Her brother also has it encouraged to wear warm gloves and socks and report worsening symptoms.

## 2018-08-08 NOTE — Assessment & Plan Note (Signed)
Watery but never bloody. She notes it started after starting Magnesium Glycinate. Will stop this along with numerous supplements and we will see if that helps will run cultures if it does not improve

## 2018-08-08 NOTE — Assessment & Plan Note (Signed)
hgba1c acceptable, minimize simple carbs. Increase exercise as tolerated.  

## 2018-08-08 NOTE — Patient Instructions (Addendum)
Diarrhea, Adult Diarrhea is frequent loose and watery bowel movements. Diarrhea can make you feel weak and cause you to become dehydrated. Dehydration can make you tired and thirsty, cause you to have a dry mouth, and decrease how often you urinate. Diarrhea typically lasts 2-3 days. However, it can last longer if it is a sign of something more serious. It is important to treat your diarrhea as told by your health care provider. Follow these instructions at home: Eating and drinking     Follow these recommendations as told by your health care provider:  Take an oral rehydration solution (ORS). This is an over-the-counter medicine that helps return your body to its normal balance of nutrients and water. It is found at pharmacies and retail stores.  Drink plenty of fluids, such as water, ice chips, diluted fruit juice, and low-calorie sports drinks. You can drink milk also, if desired.  Avoid drinking fluids that contain a lot of sugar or caffeine, such as energy drinks, sports drinks, and soda.  Eat bland, easy-to-digest foods in small amounts as you are able. These foods include bananas, applesauce, rice, lean meats, toast, and crackers.  Avoid alcohol.  Avoid spicy or fatty foods.  Medicines  Take over-the-counter and prescription medicines only as told by your health care provider.  If you were prescribed an antibiotic medicine, take it as told by your health care provider. Do not stop using the antibiotic even if you start to feel better. General instructions   Wash your hands often using soap and water. If soap and water are not available, use a hand sanitizer. Others in the household should wash their hands as well. Hands should be washed: ? After using the toilet or changing a diaper. ? Before preparing, cooking, or serving food. ? While caring for a sick person or while visiting someone in a hospital.  Drink enough fluid to keep your urine pale yellow.  Rest at home while  you recover.  Watch your condition for any changes.  Take a warm bath to relieve any burning or pain from frequent diarrhea episodes.  Keep all follow-up visits as told by your health care provider. This is important. Contact a health care provider if:  You have a fever.  Your diarrhea gets worse.  You have new symptoms.  You cannot keep fluids down.  You feel light-headed or dizzy.  You have a headache.  You have muscle cramps. Get help right away if:  You have chest pain.  You feel extremely weak or you faint.  You have bloody or black stools or stools that look like tar.  You have severe pain, cramping, or bloating in your abdomen.  You have trouble breathing or you are breathing very quickly.  Your heart is beating very quickly.  Your skin feels cold and clammy.  You feel confused.  You have signs of dehydration, such as: ? Dark urine, very little urine, or no urine. ? Cracked lips. ? Dry mouth. ? Sunken eyes. ? Sleepiness. ? Weakness. Summary  Diarrhea is frequent loose and watery bowel movements. Diarrhea can make you feel weak and cause you to become dehydrated.  Drink enough fluids to keep your urine pale yellow.  Make sure that you wash your hands after using the toilet. If soap and water are not available, use hand sanitizer.  Contact a health care provider if your diarrhea gets worse or you have new symptoms.  Get help right away if you have signs of dehydration. This   information is not intended to replace advice given to you by your health care provider. Make sure you discuss any questions you have with your health care provider. Document Released: 06/05/2002 Document Revised: 11/19/2017 Document Reviewed: 11/19/2017 Elsevier Interactive Patient Education  2019 [place] phenomenon is a condition that affects the blood vessels (arteries) that carry blood to your fingers and toes. The arteries that supply  blood to your ears, lips, nipples, or the tip of your nose might also be affected. Raynaud phenomenon causes the arteries to become narrow temporarily (spasm). As a result, the flow of blood to the affected areas is temporarily decreased. This usually occurs in response to cold temperatures or stress. During an attack, the skin in the affected areas turns white, then blue, and finally red. You may also feel tingling or numbness in those areas. Attacks usually last for only a brief period, and then the blood flow to the area returns to normal. In most cases, Raynaud phenomenon does not cause serious health problems. What are the causes? In many cases, the cause of this condition is not known. The condition may occur on its own (primary Raynaud phenomenon) or may be associated with other diseases or factors (secondary Raynaud phenomenon). Possible causes may include:  Diseases or medical conditions that damage the arteries.  Injuries and repetitive actions that hurt the hands or feet.  Being exposed to certain chemicals.  Taking medicines that narrow the arteries.  Other medical conditions, such as lupus, scleroderma, rheumatoid arthritis, thyroid problems, blood disorders, Sjogren syndrome, or atherosclerosis. What increases the risk? The following factors may make you more likely to develop this condition:  Being 34-14 years old.  Being female.  Having a family history of Raynaud phenomenon.  Living in a cold climate.  Smoking. What are the signs or symptoms? Symptoms of this condition usually occur when you are exposed to cold temperatures or when you have emotional stress. The symptoms may last for a few minutes or up to several hours. They usually affect your fingers but may also affect your toes, nipples, lips, ears, or the tip of your nose. Symptoms may include:  Changes in skin color. The skin in the affected areas will turn pale or white. The skin may then change from white to  bluish to red as normal blood flow returns to the area.  Numbness, tingling, or pain in the affected areas. In severe cases, symptoms may include:  Skin sores.  Tissues decaying and dying (gangrene). How is this diagnosed? This condition may be diagnosed based on:  Your symptoms and medical history.  A physical exam. During the exam, you may be asked to put your hands in cold water to check for a reaction to cold temperature.  Tests, such as: ? Blood tests to check for other diseases or conditions. ? A test to check the movement of blood through your arteries and veins (vascular ultrasound). ? A test in which the skin at the base of your fingernail is examined under a microscope (nailfold capillaroscopy). How is this treated? Treatment for this condition often involves making lifestyle changes and taking steps to control your exposure to cold temperatures. For more severe cases, medicine (calcium channel blockers) may be used to improve blood flow. Surgery is sometimes done to block the nerves that control the affected arteries, but this is rare. Follow these instructions at home: Avoiding cold temperatures Take these steps to avoid exposure to cold:  If possible, stay  indoors during cold weather.  When you go outside during cold weather, dress in layers and wear mittens, a hat, a scarf, and warm footwear.  Wear mittens or gloves when handling ice or frozen food.  Use holders for glasses or cans containing cold drinks.  Let warm water run for a while before taking a shower or bath.  Warm up the car before driving in cold weather. Lifestyle   If possible, avoid stressful and emotional situations. Try to find ways to manage your stress, such as: ? Exercise. ? Yoga. ? Meditation. ? Biofeedback.  Do not use any products that contain nicotine or tobacco, such as cigarettes and e-cigarettes. If you need help quitting, ask your health care provider.  Avoid secondhand  smoke.  Limit your use of caffeine. ? Switch to decaffeinated coffee, tea, and soda. ? Avoid chocolate.  Avoid vibrating tools and machinery. General instructions  Protect your hands and feet from injuries, cuts, or bruises.  Avoid wearing tight rings or wristbands.  Wear loose fitting socks and comfortable, roomy shoes.  Take over-the-counter and prescription medicines only as told by your health care provider. Contact a health care provider if:  Your discomfort becomes worse despite lifestyle changes.  You develop sores on your fingers or toes that do not heal.  Your fingers or toes turn black.  You have breaks in the skin on your fingers or toes.  You have a fever.  You have pain or swelling in your joints.  You have a rash.  Your symptoms occur on only one side of your body. Summary  Raynaud phenomenon is a condition that affects the arteries that carry blood to your fingers, toes, ears, lips, nipples, or the tip of your nose.  In many cases, the cause of this condition is not known.  Symptoms of this condition include changes in skin color, and numbness and tingling of the affected area.  Treatment for this condition includes lifestyle changes, reducing exposure to cold temperatures, and using medicines for severe cases of the condition.  Contact your health care provider if your condition worsens despite treatment. This information is not intended to replace advice given to you by your health care provider. Make sure you discuss any questions you have with your health care provider. Document Released: 06/12/2000 Document Revised: 12/29/2016 Document Reviewed: 07/27/2016 Elsevier Interactive Patient Education  2019 [employer].

## 2018-08-09 LAB — CUP PACEART INCLINIC DEVICE CHECK
Date Time Interrogation Session: 20200211110920
Implantable Pulse Generator Implant Date: 20191018

## 2018-08-15 ENCOUNTER — Other Ambulatory Visit: Payer: Self-pay | Admitting: Cardiovascular Disease

## 2018-08-15 MED ORDER — APIXABAN 5 MG PO TABS: 5.0000 mg | ORAL_TABLET | Freq: Two times a day (BID) | ORAL | 5 refills | Status: DC

## 2018-08-15 NOTE — Telephone Encounter (Signed)
New Message    *STAT* If patient is at the pharmacy, call can be transferred to refill team.   1. Which medications need to be refilled? (please list name of each medication and dose if known) Eliquis 5mg  twice a day  2. Which pharmacy/location (including street and city if local pharmacy) is medication to be sent to? [facility] on [address]  3. Do they need a 30 day or 90 day supply? 30 day supply

## 2018-08-17 DIAGNOSIS — H52203 Unspecified astigmatism, bilateral: Secondary | ICD-10-CM | POA: Diagnosis not present

## 2018-08-17 DIAGNOSIS — H04123 Dry eye syndrome of bilateral lacrimal glands: Secondary | ICD-10-CM | POA: Diagnosis not present

## 2018-08-17 DIAGNOSIS — H2513 Age-related nuclear cataract, bilateral: Secondary | ICD-10-CM | POA: Diagnosis not present

## 2018-08-25 ENCOUNTER — Ambulatory Visit (INDEPENDENT_AMBULATORY_CARE_PROVIDER_SITE_OTHER): Payer: Medicare HMO | Admitting: *Deleted

## 2018-08-25 DIAGNOSIS — R55 Syncope and collapse: Secondary | ICD-10-CM | POA: Diagnosis not present

## 2018-08-26 LAB — CUP PACEART REMOTE DEVICE CHECK
Date Time Interrogation Session: 20200227184035
Implantable Pulse Generator Implant Date: 20191018

## 2018-08-31 NOTE — Progress Notes (Signed)
Carelink Summary Report / Loop Recorder 

## 2018-09-27 ENCOUNTER — Other Ambulatory Visit: Payer: Self-pay

## 2018-09-27 ENCOUNTER — Ambulatory Visit (INDEPENDENT_AMBULATORY_CARE_PROVIDER_SITE_OTHER): Payer: Medicare HMO | Admitting: *Deleted

## 2018-09-27 DIAGNOSIS — R55 Syncope and collapse: Secondary | ICD-10-CM | POA: Diagnosis not present

## 2018-09-28 LAB — CUP PACEART REMOTE DEVICE CHECK
Date Time Interrogation Session: 20200331194133
Implantable Pulse Generator Implant Date: 20191018

## 2018-10-05 NOTE — Progress Notes (Signed)
Carelink Summary Report / Loop Recorder 

## 2018-10-20 ENCOUNTER — Telehealth: Payer: Self-pay

## 2018-10-20 ENCOUNTER — Telehealth: Payer: Self-pay | Admitting: *Deleted

## 2018-10-20 ENCOUNTER — Other Ambulatory Visit: Payer: Self-pay | Admitting: Cardiovascular Disease

## 2018-10-20 NOTE — Telephone Encounter (Signed)
Virtual Visit Pre-Appointment Phone Call  "Sophia [name], I am calling you today to discuss your upcoming appointment. We are currently trying to limit exposure to the virus that causes COVID-19 by seeing patients at home rather than in the office."  1. "What is the BEST phone number to call the day of the visit?" - include this in appointment notes  2. "Do you have or have access to (through a family member/friend) a smartphone with video capability that we can use for your visit?" a. If yes - list this number in appt notes as "cell" (if different from BEST phone #) and list the appointment type as a VIDEO visit in appointment notes b. If no - list the appointment type as a PHONE visit in appointment notes  3. Confirm consent - "In the setting of the current Covid19 crisis, you are scheduled for a (phone or video) visit with your provider on (date) at (time).  Just as we do with many in-office visits, in order for you to participate in this visit, we must obtain consent.  If you'd like, I can send this to your mychart (if signed up) or email for you to review.  Otherwise, I can obtain your verbal consent now.  All virtual visits are billed to your insurance company just like a normal visit would be.  By agreeing to a virtual visit, we'd like you to understand that the technology does not allow for your provider to perform an examination, and thus may limit your provider's ability to fully assess your condition. If your provider identifies any concerns that need to be evaluated in person, we will make arrangements to do so.  Finally, though the technology is pretty good, we cannot assure that it will always work on either your or our end, and in the setting of a video visit, we may have to convert it to a phone-only visit.  In either situation, we cannot ensure that we have a secure connection.  Are you willing to proceed?" STAFF: Did the patient verbally acknowledge consent to telehealth visit?  Document YES/NO here: YES  4. Advise patient to be prepared - "Two hours prior to your appointment, go ahead and check your blood pressure, pulse, oxygen saturation, and your weight (if you have the equipment to check those) and write them all down. When your visit starts, your provider will ask you for this information. If you have an Apple Watch or Kardia device, please plan to have heart rate information ready on the day of your appointment. Please have a pen and paper handy nearby the day of the visit as well."  5. Give patient instructions for MyChart download to smartphone OR Doximity/[email or web address] as below if video visit (depending on what platform provider is using)  6. Inform patient they will receive a phone call 15 minutes prior to their appointment time (may be from unknown caller ID) so they should be prepared to answer    TELEPHONE CALL NOTE  Sophia [name] has been deemed a candidate for a follow-up tele-health visit to limit community exposure during the Covid-19 pandemic. I spoke with the patient via phone to ensure availability of phone/video source, confirm preferred email & phone number, and discuss instructions and expectations.  I reminded Sophia [name] to be prepared with any vital sign and/or heart rhythm information that could potentially be obtained via home monitoring, at the time of her visit. I reminded Sophia [name] to expect a phone call prior  to her visit.  Harold Hedge, [place] 10/20/2018 11:37 AM   INSTRUCTIONS FOR DOWNLOADING THE MYCHART APP TO SMARTPHONE  - The patient must first make sure to have activated MyChart and know their login information - If Apple, go to [employer] and type in MyChart in the search bar and download the app. If Android, ask patient to go to [employer] and type in [place] in the search bar and download the app. The app is free but as with any other app downloads, their phone may require them to verify saved payment information  or Apple/Android password.  - The patient will need to then log into the app with their MyChart username and password, and select [facility] as their healthcare provider to link the account. When it is time for your visit, go to the MyChart app, find appointments, and click Begin Video Visit. Be sure to Select Allow for your device to access the Microphone and Camera for your visit. You will then be connected, and your provider will be with you shortly.  **If they have any issues connecting, or need assistance please contact MyChart service desk [phone])**  **If using a computer, in order to ensure the best quality for their visit they will need to use either of the following Internet Browsers: [employer], or Google Chrome**  IF USING DOXIMITY or DOXY.ME - The patient will receive a link just prior to their visit by text.     FULL LENGTH CONSENT FOR TELE-HEALTH VISIT   I hereby voluntarily request, consent and authorize [facility] and its employed or contracted physicians, physician assistants, nurse practitioners or other licensed health care professionals (the Practitioner), to provide me with telemedicine health care services (the "Services") as deemed necessary by the treating Practitioner. I acknowledge and consent to receive the Services by the Practitioner via telemedicine. I understand that the telemedicine visit will involve communicating with the Practitioner through live audiovisual communication technology and the disclosure of certain medical information by electronic transmission. I acknowledge that I have been given the opportunity to request an in-person assessment or other available alternative prior to the telemedicine visit and am voluntarily participating in the telemedicine visit.  I understand that I have the right to withhold or withdraw my consent to the use of telemedicine in the course of my care at any time, without affecting my right to future  care or treatment, and that the Practitioner or I may terminate the telemedicine visit at any time. I understand that I have the right to inspect all information obtained and/or recorded in the course of the telemedicine visit and may receive copies of available information for a reasonable fee.  I understand that some of the potential risks of receiving the Services via telemedicine include:  [name] Delay or interruption in medical evaluation due to technological equipment failure or disruption; . Information transmitted may not be sufficient (e.g. poor resolution of images) to allow for appropriate medical decision making by the Practitioner; and/or  . In rare instances, security protocols could fail, causing a breach of personal health information.  Furthermore, I acknowledge that it is my responsibility to provide information about my medical history, conditions and care that is complete and accurate to the best of my ability. I acknowledge that Practitioner's advice, recommendations, and/or decision may be based on factors not within their control, such as incomplete or inaccurate data provided by me or distortions of diagnostic images or specimens that may result from electronic  transmissions. I understand that the practice of medicine is not an exact science and that Practitioner makes no warranties or guarantees regarding treatment outcomes. I acknowledge that I will receive a copy of this consent concurrently upon execution via email to the email address I last provided but may also request a printed copy by calling the office of [facility].    I understand that my insurance will be billed for this visit.   I have read or had this consent read to me. . I understand the contents of this consent, which adequately explains the benefits and risks of the Services being provided via telemedicine.  . I have been provided ample opportunity to ask questions regarding this consent and the Services and have  had my questions answered to my satisfaction. . I give my informed consent for the services to be provided through the use of telemedicine in my medical care  By participating in this telemedicine visit I agree to the above.

## 2018-10-20 NOTE — Telephone Encounter (Signed)
Spoke with patient to schedule 6 month f/u with Dr. Gwenlyn Found.  She stated she no longer sees Dr. Gwenlyn Found only Dr. Sallyanne Kuster.

## 2018-10-20 NOTE — Telephone Encounter (Signed)
Carvedilol 12.5 mg refilled. 

## 2018-10-20 NOTE — Telephone Encounter (Signed)
Got disconnected in middle of call. Called patient back but no answer left detailed message on machine informing patient to check vitals if possible, have pen and paper handy and they she would receive a text from Dr C to start her video visit. Advised patient to contact office with any questions or concerns.

## 2018-10-21 ENCOUNTER — Telehealth: Payer: Self-pay | Admitting: Cardiovascular Disease

## 2018-10-21 NOTE — Telephone Encounter (Signed)
Mychart, smartphone, pre reg complete 10/21/18 AF °

## 2018-10-24 ENCOUNTER — Telehealth (INDEPENDENT_AMBULATORY_CARE_PROVIDER_SITE_OTHER): Payer: Medicare HMO | Admitting: Cardiovascular Disease

## 2018-10-24 DIAGNOSIS — I472 Ventricular tachycardia: Secondary | ICD-10-CM | POA: Diagnosis not present

## 2018-10-24 DIAGNOSIS — I480 Paroxysmal atrial fibrillation: Secondary | ICD-10-CM | POA: Diagnosis not present

## 2018-10-24 DIAGNOSIS — I4729 Other ventricular tachycardia: Secondary | ICD-10-CM

## 2018-10-24 NOTE — Progress Notes (Signed)
Virtual Visit via Video Note   This visit type was conducted due to national recommendations for restrictions regarding the COVID-19 Pandemic (e.g. social distancing) in an effort to limit this patient's exposure and mitigate transmission in our community.  Due to her co-morbid illnesses, this patient is at least at moderate risk for complications without adequate follow up.  This format is felt to be most appropriate for this patient at this time.  All issues noted in this document were discussed and addressed.  A limited physical exam was performed with this format.  Please refer to the patient's chart for her consent to telehealth for [facility].   Evaluation Performed:  Follow-up visit  Date:  10/24/2018   ID:  Sophia [name], DOB 03/31/1941, MRN [id number]  Patient Location: Home Provider Location: Home  PCP:  Mosie Lukes, MD  Cardiologist:  Quay Burow, MD  Electrophysiologist:  None   Chief Complaint:  AFib f/u  History of Present Illness:    Sophia [name] is a 78 y.o. female with a history of a single episode of near syncope for which she received a loop recorder (work-up had shown evidence of nonsustained VT and atrial tachycardia prior to her loop recorder implantation).  The loop recorder documented a 3-hour episode of paroxysmal atrial fibrillation with rapid ventricular response, associated with nausea and a sensation of weakness reminiscent of her near syncope.   She was started on Eliquis and a higher dose of beta-blocker and since then has felt well.  She has not had any recurrent syncope, palpitations and denies any falls, bleeding or other complications from anticoagulation.  Today she forgot to take her medications early in the morning and only took her carvedilol around 1:00.  Her blood pressure is a little bit high today.  Her loop recorder has not documented any further meaningful arrhythmia.  She has a history of hyperlipidemia but has not  tolerated statins.  She has a family history of premature onset coronary disease in a first-degree female relatives.  Her recent echocardiogram does not show significant structural heart disease.  LVEF is normal. She had a low risk nuclear myocardial perfusion study.  She does not have carotid stenosis on ultrasound studies.  She has had previous problems with chest pain, but none recently and the work-up so far has been negative.  She has frequent palpitations and complains of a rushing sensation in her ear with each heartbeat.  This is positional.  She is a Restaurant manager, fast food.  She lives with her daughter, son-in-law and grandchild.  The only one leaving the household is her son-in-law, who goes to work and does all the grocery shopping.  The patient does not have symptoms concerning for COVID-19 infection (fever, chills, cough, or new shortness of breath).    Past Medical History:  Diagnosis Date  . Anxiety and depression   . Arthritis    hands, knees, etc  . Bladder prolapse, female, acquired 09/16/2014  . Bowen's disease   . Diverticulitis   . Diverticulosis   . GERD (gastroesophageal reflux disease)   . H/O hematuria    for several years, work only revealed kidney stone on right side  . History of chicken pox   . History of kidney stones   . Hyperlipidemia   . Hypertension   . Leg cramps, sleep related 04/02/2016  . Myalgia 04/25/2017  . Osteopenia 06/26/2014  . Osteoporosis 06/26/2014  . Plantar wart of left foot 10/15/2016  . Right shoulder  pain 04/23/2017  . SCC (squamous cell carcinoma)    arms   Past Surgical History:  Procedure Laterality Date  . CHOLECYSTECTOMY  2005  . LOOP RECORDER INSERTION N/A 04/15/2018   Procedure: LOOP RECORDER INSERTION;  Surgeon: Sanda Klein, MD;  Location: [facility];  Service: Cardiovascular;  Laterality: N/A;  . SKIN BIOPSY     multiple     Current Meds  Medication Sig  . acetaminophen (TYLENOL) 500 MG tablet Take 1,000  mg by mouth 2 (two) times daily as needed for moderate pain or headache.  . ALPRAZolam (XANAX) 0.25 MG tablet Take 1 tablet (0.25 mg total) by mouth daily as needed for anxiety or sleep.  [name] apixaban (ELIQUIS) 5 MG TABS tablet Take 1 tablet (5 mg total) by mouth 2 (two) times daily.  . Calcium Carb-Cholecalciferol (CALCIUM 600 + D PO) Take 1 tablet by mouth daily.  . carvedilol (COREG) 12.5 MG tablet TAKE 1 TABLET(12.5 MG) BY MOUTH TWICE DAILY  . estradiol (ESTRACE) 0.1 MG/GM vaginal cream Place 1 Applicatorful vaginally 2 (two) times a week.  . Hypromellose (ARTIFICIAL TEARS OP) Place 1 drop into both eyes 2 (two) times daily.  . Probiotic Product (PROBIOTIC DAILY PO) Take 1 capsule by mouth daily.      Allergies:   Morphine and related; Statins; and Sulfa antibiotics   Social History   Tobacco Use  . Smoking status: Never Smoker  . Smokeless tobacco: Never Used  Substance Use Topics  . Alcohol use: No    Alcohol/week: 0.0 standard drinks  . Drug use: No     Family Hx: The patient's family history includes Birth defects in her maternal uncle; Cancer in her father, mother, and paternal grandfather; Early death in her daughter; Heart attack in her mother; Heart disease in her brother, maternal grandfather, and mother; Hyperlipidemia in her brother and mother; Hypertension in her brother, daughter, father, and mother; Stroke in her brother; Tuberculosis in her paternal grandmother.  ROS:   Please see the history of present illness.     All other systems reviewed and are negative.   Prior CV studies:   The following studies were reviewed today: Loop recorder downloads  Labs/Other Tests and Data Reviewed:    EKG:  An ECG dated 02/25/2018 was personally reviewed today and demonstrated:  NSR  Recent Labs: 04/26/2018: TSH 1.32 08/08/2018: ALT 17; BUN 11; Creatinine, Ser 0.81; Hemoglobin 13.6; Platelets 172.0; Potassium 5.0; Sodium 139   Recent Lipid Panel Lab Results  Component  Value Date/Time   CHOL 203 (H) 04/26/2018 11:56 AM   TRIG 178.0 (H) 04/26/2018 11:56 AM   HDL 47.60 04/26/2018 11:56 AM   CHOLHDL 4 04/26/2018 11:56 AM   LDLCALC 120 (H) 04/26/2018 11:56 AM   LDLDIRECT 150.0 04/23/2017 10:35 AM    Wt Readings from Last 3 Encounters:  10/24/18 139 lb 8 oz (63.3 kg)  08/08/18 139 lb 3.2 oz (63.1 kg)  08/03/18 138 lb 12.8 oz (63 kg)     Objective:    Vital Signs:  BP (!) 145/84   Pulse 77   Ht 5\' 4"  (1.626 m)   Wt 139 lb 8 oz (63.3 kg)   BMI 23.95 kg/m    VITAL SIGNS:  reviewed GEN:  no acute distress EYES:  sclerae anicteric, EOMI - Extraocular Movements Intact RESPIRATORY:  normal respiratory effort, symmetric expansion CARDIOVASCULAR:  no peripheral edema SKIN:  no rash, lesions or ulcers. MUSCULOSKELETAL:  no obvious deformities. NEURO:  alert and  oriented x 3, no obvious focal deficit  PSYCH: normal affect  ASSESSMENT & PLAN:    1. AFib: No recurrence clinically or on the loop recorder.  Appropriately anticoagulated. CHADSVasc 3 (age, gender), but no major structural heart problems and no history of embolic events. 2. Eliquis: No bleeding complications.  She is a Restaurant manager, fast food. 3. NSVT: None detected since loop recorder implantation. 4. ILR: Normal device function, continue monthly downloads.  COVID-19 Education: The signs and symptoms of COVID-19 were discussed with the patient and how to seek care for testing (follow up with PCP or arrange E-visit).  The importance of social distancing was discussed today.  Time:   Today, I have spent 22 minutes with the patient with telehealth technology discussing the above problems.     Medication Adjustments/Labs and Tests Ordered: Current medicines are reviewed at length with the patient today.  Concerns regarding medicines are outlined above.   Tests Ordered: No orders of the defined types were placed in this encounter.   Medication Changes: No orders of the defined types were  placed in this encounter.   Disposition:  Follow up 12 months  Signed, Sanda Klein, MD  10/24/2018 2:31 PM    [place]

## 2018-10-24 NOTE — Patient Instructions (Addendum)
Medication Instructions:  Your physician recommends that you continue on your current medications as directed. Please refer to the Current Medication list given to you today.  If you need a refill on your cardiac medications before your next appointment, please call your pharmacy.   Follow-Up: At [facility], you and your health needs are our priority.  As part of our continuing mission to provide you with exceptional heart care, we have created designated Provider Care Teams.  These Care Teams include your primary Cardiologist (physician) and Advanced Practice Providers (APPs -  Physician Assistants and Nurse Practitioners) who all work together to provide you with the care you need, when you need it. You will need a follow up appointment in 12 months.  Please call our office 2 months in advance to schedule this appointment.  You may see Mihai Croitoru, MD or one of the following Advanced Practice Providers on your designated Care Team: Hao Meng, PA-C . Angela Duke, PA-C  Any Other Special Instructions Will Be Listed Below (If Applicable). None   

## 2018-10-25 ENCOUNTER — Telehealth: Payer: Medicare HMO | Admitting: Cardiovascular Disease

## 2018-11-07 ENCOUNTER — Ambulatory Visit (INDEPENDENT_AMBULATORY_CARE_PROVIDER_SITE_OTHER): Payer: Medicare HMO | Admitting: Family Medicine

## 2018-11-07 ENCOUNTER — Other Ambulatory Visit: Payer: Self-pay

## 2018-11-07 ENCOUNTER — Ambulatory Visit (INDEPENDENT_AMBULATORY_CARE_PROVIDER_SITE_OTHER): Payer: Medicare HMO | Admitting: *Deleted

## 2018-11-07 DIAGNOSIS — I480 Paroxysmal atrial fibrillation: Secondary | ICD-10-CM

## 2018-11-07 DIAGNOSIS — E7800 Pure hypercholesterolemia, unspecified: Secondary | ICD-10-CM | POA: Diagnosis not present

## 2018-11-07 DIAGNOSIS — I10 Essential (primary) hypertension: Secondary | ICD-10-CM

## 2018-11-07 DIAGNOSIS — R55 Syncope and collapse: Secondary | ICD-10-CM

## 2018-11-07 LAB — CUP PACEART REMOTE DEVICE CHECK
Date Time Interrogation Session: 20200511081247
Implantable Pulse Generator Implant Date: 20191018

## 2018-11-07 NOTE — Assessment & Plan Note (Signed)
Encouraged heart healthy diet, increase exercise, avoid trans fats, consider a krill oil cap daily 

## 2018-11-07 NOTE — Progress Notes (Signed)
Virtual Visit via Video Note  I connected with Sophia [name] on 11/07/18 at 10:20 AM EDT by a video enabled telemedicine application and verified that I am speaking with the correct person using two identifiers.  Location: Patient: home Provider: office   I discussed the limitations of evaluation and management by telemedicine and the availability of in person appointments. The patient expressed understanding and agreed to proceed.     Subjective:    Patient ID: Sophia [name], female    DOB: 1940/07/26, 78 y.o.   MRN: [id number]  No chief complaint on file.   HPI Patient is in today for follow up on chronic medical concerns including a fib, hyperlipidemia, hypertension and more. She feels well today. She is able to self quarantine with the assistance of her family. Denies CP/palp/SOB/HA/congestion/fevers/GI or GU c/o. Taking meds as prescribed. Continues to struggle with pain and weakness, paresthesias in hands and is waiting for an EMG to evaluate for CTS,  Past Medical History:  Diagnosis Date  . Anxiety and depression   . Arthritis    hands, knees, etc  . Bladder prolapse, female, acquired 09/16/2014  . Bowen's disease   . Diverticulitis   . Diverticulosis   . GERD (gastroesophageal reflux disease)   . H/O hematuria    for several years, work only revealed kidney stone on right side  . History of chicken pox   . History of kidney stones   . Hyperlipidemia   . Hypertension   . Leg cramps, sleep related 04/02/2016  . Myalgia 04/25/2017  . Osteopenia 06/26/2014  . Osteoporosis 06/26/2014  . Plantar wart of [name] foot 10/15/2016  . Right shoulder pain 04/23/2017  . SCC (squamous cell carcinoma)    arms    Past Surgical History:  Procedure Laterality Date  . CHOLECYSTECTOMY  2005  . LOOP RECORDER INSERTION N/A 04/15/2018   Procedure: LOOP RECORDER INSERTION;  Surgeon: Sanda Klein, MD;  Location: [place] [facility];  Service: Cardiovascular;  Laterality: N/A;   . SKIN BIOPSY     multiple    Family History  Problem Relation Age of Onset  . Heart disease Mother        aortic stenosis  . Hyperlipidemia Mother   . Hypertension Mother   . Heart attack Mother   . Cancer Mother        BCC, SCC, skin  . Hypertension Father   . Cancer Father        SCC, BCC, skin  . Stroke Brother   . Early death Daughter   . Heart disease Maternal Grandfather        MI  . Tuberculosis Paternal Grandmother   . Cancer Paternal Grandfather        head and neck cancer, chewed tobacco  . Hypertension Daughter   . Heart disease Brother        arrythmia  . Hypertension Brother   . Hyperlipidemia Brother   . Birth defects Maternal Uncle     Social History   Socioeconomic History  . Marital status: Single    Spouse name: Not on file  . Number of children: Not on file  . Years of education: Not on file  . Highest education level: Not on file  Occupational History  . Not on file  Social Needs  . Financial resource strain: Not on file  . Food insecurity:    Worry: Not on file    Inability: Not on file  . Transportation needs:  Medical: Not on file    Non-medical: Not on file  Tobacco Use  . Smoking status: Never Smoker  . Smokeless tobacco: Never Used  Substance and Sexual Activity  . Alcohol use: No    Alcohol/week: 0.0 standard drinks  . Drug use: No  . Sexual activity: Never    Comment: divorced, widowed, lives with elderly parents, is the caregiver, no dietary restrictions.   Lifestyle  . Physical activity:    Days per week: Not on file    Minutes per session: Not on file  . Stress: Not on file  Relationships  . Social connections:    Talks on phone: Not on file    Gets together: Not on file    Attends religious service: Not on file    Active member of club or organization: Not on file    Attends meetings of clubs or organizations: Not on file    Relationship status: Not on file  . Intimate partner violence:    Fear of current or  ex partner: Not on file    Emotionally abused: Not on file    Physically abused: Not on file    Forced sexual activity: Not on file  Other Topics Concern  . Not on file  Social History Narrative  . Not on file    Outpatient Medications Prior to Visit  Medication Sig Dispense Refill  . acetaminophen (TYLENOL) 500 MG tablet Take 1,000 mg by mouth 2 (two) times daily as needed for moderate pain or headache.    . ALPRAZolam (XANAX) 0.25 MG tablet Take 1 tablet (0.25 mg total) by mouth daily as needed for anxiety or sleep. 30 tablet 1  . apixaban (ELIQUIS) 5 MG TABS tablet Take 1 tablet (5 mg total) by mouth 2 (two) times daily. 60 tablet 5  . Calcium Carb-Cholecalciferol (CALCIUM 600 + D PO) Take 1 tablet by mouth daily.    . carvedilol (COREG) 12.5 MG tablet TAKE 1 TABLET(12.5 MG) BY MOUTH TWICE DAILY 180 tablet 3  . estradiol (ESTRACE) 0.1 MG/GM vaginal cream Place 1 Applicatorful vaginally 2 (two) times a week.    . Hypromellose (ARTIFICIAL TEARS OP) Place 1 drop into both eyes 2 (two) times daily.    . Probiotic Product (PROBIOTIC DAILY PO) Take 1 capsule by mouth daily.      No facility-administered medications prior to visit.     Allergies  Allergen Reactions  . Morphine And Related Swelling  . Statins Other (See Comments)    Myalgias, weakness  . Sulfa Antibiotics Swelling and Rash    Review of Systems  Constitutional: Negative for fever and malaise/fatigue.  HENT: Negative for congestion.   Eyes: Negative for blurred vision.  Respiratory: Negative for shortness of breath.   Cardiovascular: Negative for chest pain, palpitations and leg swelling.  Gastrointestinal: Negative for abdominal pain, blood in stool and nausea.  Genitourinary: Negative for dysuria and frequency.  Musculoskeletal: Negative for falls.  Skin: Negative for rash.  Neurological: Negative for dizziness, loss of consciousness and headaches.  Endo/Heme/Allergies: Negative for environmental allergies.   Psychiatric/Behavioral: Negative for depression. The patient is not nervous/anxious.        Objective:    Physical Exam Constitutional:      Appearance: Normal appearance. She is not ill-appearing.  HENT:     Head: Normocephalic and atraumatic.     Nose: Nose normal.  Eyes:     General:        Right eye: No discharge.  [name] eye: No discharge.  Pulmonary:     Effort: Pulmonary effort is normal.  Neurological:     Mental Status: She is alert and oriented to person, place, and time.  Psychiatric:        Mood and Affect: Mood normal.        Behavior: Behavior normal.     BP 135/76 (BP Location: [name] Arm, Patient Position: Sitting, Cuff Size: Normal)   Pulse 69   Temp 98.6 F (37 C) (Oral)   Wt 139 lb (63 kg)   BMI 23.86 kg/m  Wt Readings from Last 3 Encounters:  11/07/18 139 lb (63 kg)  10/24/18 139 lb 8 oz (63.3 kg)  08/08/18 139 lb 3.2 oz (63.1 kg)    Diabetic Foot Exam - Simple   No data filed     Lab Results  Component Value Date   WBC 7.9 08/08/2018   HGB 13.6 08/08/2018   HCT 41.0 08/08/2018   PLT 172.0 08/08/2018   GLUCOSE 91 08/08/2018   CHOL 203 (H) 04/26/2018   TRIG 178.0 (H) 04/26/2018   HDL 47.60 04/26/2018   LDLDIRECT 150.0 04/23/2017   LDLCALC 120 (H) 04/26/2018   ALT 17 08/08/2018   AST 21 08/08/2018   NA 139 08/08/2018   K 5.0 08/08/2018   CL 102 08/08/2018   CREATININE 0.81 08/08/2018   BUN 11 08/08/2018   CO2 27 08/08/2018   TSH 1.32 04/26/2018   HGBA1C 5.8 04/26/2018    Lab Results  Component Value Date   TSH 1.32 04/26/2018   Lab Results  Component Value Date   WBC 7.9 08/08/2018   HGB 13.6 08/08/2018   HCT 41.0 08/08/2018   MCV 94.8 08/08/2018   PLT 172.0 08/08/2018   Lab Results  Component Value Date   NA 139 08/08/2018   K 5.0 08/08/2018   CO2 27 08/08/2018   GLUCOSE 91 08/08/2018   BUN 11 08/08/2018   CREATININE 0.81 08/08/2018   BILITOT 0.7 08/08/2018   ALKPHOS 68 08/08/2018   AST 21 08/08/2018   ALT  17 08/08/2018   PROT 6.8 08/08/2018   ALBUMIN 4.4 08/08/2018   CALCIUM 9.6 08/08/2018   ANIONGAP 10 02/22/2018   GFR 68.71 08/08/2018   Lab Results  Component Value Date   CHOL 203 (H) 04/26/2018   Lab Results  Component Value Date   HDL 47.60 04/26/2018   Lab Results  Component Value Date   LDLCALC 120 (H) 04/26/2018   Lab Results  Component Value Date   TRIG 178.0 (H) 04/26/2018   Lab Results  Component Value Date   CHOLHDL 4 04/26/2018   Lab Results  Component Value Date   HGBA1C 5.8 04/26/2018       Assessment & Plan:   Problem List Items Addressed This Visit    Hypertension     no changes to meds. Encouraged heart healthy diet such as the DASH diet and exercise as tolerated.       Hyperlipidemia    Encouraged heart healthy diet, increase exercise, avoid trans fats, consider a krill oil cap daily      Atrial fibrillation (HCC)    Tolerating Eliquis         I am having Laryn L. Buehl maintain her Probiotic Product (PROBIOTIC DAILY PO), estradiol, Calcium Carb-Cholecalciferol (CALCIUM 600 + D PO), acetaminophen, Hypromellose (ARTIFICIAL TEARS OP), ALPRAZolam, apixaban, and carvedilol.  No orders of the defined types were placed in this encounter.  I discussed the assessment and  treatment plan with the patient. The patient was provided an opportunity to ask questions and all were answered. The patient agreed with the plan and demonstrated an understanding of the instructions.   The patient was advised to call back or seek an in-person evaluation if the symptoms worsen or if the condition fails to improve as anticipated.  I provided 25 minutes of non-face-to-face time during this encounter.   Penni Homans, MD

## 2018-11-07 NOTE — Assessment & Plan Note (Signed)
Tolerating Eliquis 

## 2018-11-07 NOTE — Assessment & Plan Note (Signed)
no changes to meds. Encouraged heart healthy diet such as the DASH diet and exercise as tolerated.  

## 2018-11-16 NOTE — Progress Notes (Signed)
Carelink Summary Report / Loop Recorder 

## 2018-12-11 LAB — CUP PACEART REMOTE DEVICE CHECK
Date Time Interrogation Session: 20200613080931
Implantable Pulse Generator Implant Date: 20191018

## 2018-12-12 ENCOUNTER — Ambulatory Visit (INDEPENDENT_AMBULATORY_CARE_PROVIDER_SITE_OTHER): Payer: Medicare HMO | Admitting: *Deleted

## 2018-12-12 DIAGNOSIS — R55 Syncope and collapse: Secondary | ICD-10-CM

## 2018-12-20 NOTE — Progress Notes (Signed)
Carelink Summary Report / Loop Recorder 

## 2018-12-26 ENCOUNTER — Ambulatory Visit: Payer: Medicare HMO | Admitting: *Deleted

## 2019-01-12 ENCOUNTER — Ambulatory Visit (INDEPENDENT_AMBULATORY_CARE_PROVIDER_SITE_OTHER): Payer: Medicare HMO | Admitting: *Deleted

## 2019-01-12 DIAGNOSIS — R55 Syncope and collapse: Secondary | ICD-10-CM | POA: Diagnosis not present

## 2019-01-12 LAB — CUP PACEART REMOTE DEVICE CHECK
Date Time Interrogation Session: 20200716101628
Implantable Pulse Generator Implant Date: 20191018

## 2019-01-19 NOTE — Progress Notes (Signed)
Carelink Summary Report / Loop Recorder 

## 2019-02-06 ENCOUNTER — Other Ambulatory Visit: Payer: Self-pay | Admitting: Cardiovascular Disease

## 2019-02-06 NOTE — Telephone Encounter (Signed)
Refill Request.  

## 2019-02-06 NOTE — Telephone Encounter (Signed)
30f 63kg Scr 0.81 08/08/18 Lovw/croitoru 10/24/18

## 2019-02-07 ENCOUNTER — Encounter: Payer: Self-pay | Admitting: Family Medicine

## 2019-02-07 ENCOUNTER — Other Ambulatory Visit: Payer: Self-pay

## 2019-02-07 ENCOUNTER — Ambulatory Visit (INDEPENDENT_AMBULATORY_CARE_PROVIDER_SITE_OTHER): Payer: Medicare HMO | Admitting: Family Medicine

## 2019-02-07 VITALS — BP 152/86 | HR 69 | Temp 97.3°F | Resp 18 | Wt 142.6 lb

## 2019-02-07 DIAGNOSIS — E7800 Pure hypercholesterolemia, unspecified: Secondary | ICD-10-CM | POA: Diagnosis not present

## 2019-02-07 DIAGNOSIS — I480 Paroxysmal atrial fibrillation: Secondary | ICD-10-CM | POA: Diagnosis not present

## 2019-02-07 DIAGNOSIS — I10 Essential (primary) hypertension: Secondary | ICD-10-CM | POA: Diagnosis not present

## 2019-02-07 DIAGNOSIS — R739 Hyperglycemia, unspecified: Secondary | ICD-10-CM | POA: Diagnosis not present

## 2019-02-07 DIAGNOSIS — K219 Gastro-esophageal reflux disease without esophagitis: Secondary | ICD-10-CM

## 2019-02-07 LAB — COMPREHENSIVE METABOLIC PANEL
ALT: 15 U/L (ref 0–35)
AST: 19 U/L (ref 0–37)
Albumin: 4.3 g/dL (ref 3.5–5.2)
Alkaline Phosphatase: 69 U/L (ref 39–117)
BUN: 13 mg/dL (ref 6–23)
CO2: 27 mEq/L (ref 19–32)
Calcium: 9.6 mg/dL (ref 8.4–10.5)
Chloride: 102 mEq/L (ref 96–112)
Creatinine, Ser: 0.78 mg/dL (ref 0.40–1.20)
GFR: 71.67 mL/min (ref >60.00)
Glucose, Bld: 88 mg/dL (ref 70–99)
Potassium: 3.9 mEq/L (ref 3.5–5.1)
Sodium: 137 mEq/L (ref 135–145)
Total Bilirubin: 0.6 mg/dL (ref 0.2–1.2)
Total Protein: 6.8 g/dL (ref 6.0–8.3)

## 2019-02-07 LAB — CBC
HCT: 39 % (ref 36.0–46.0)
Hemoglobin: 13 g/dL (ref 12.0–15.0)
MCHC: 33.4 g/dL (ref 30.0–36.0)
MCV: 94.3 fl (ref 78.0–100.0)
Platelets: 163 10*3/uL (ref 150.0–400.0)
RBC: 4.14 Mil/uL (ref 3.87–5.11)
RDW: 14 % (ref 11.5–15.5)
WBC: 7.9 10*3/uL (ref 4.0–10.5)

## 2019-02-07 LAB — TSH: TSH: 1.59 u[IU]/mL (ref 0.35–4.50)

## 2019-02-07 LAB — LIPID PANEL
Cholesterol: 248 mg/dL — ABNORMAL HIGH (ref 0–200)
HDL: 48.4 mg/dL (ref >39.00)
NonHDL: 199.28
Total CHOL/HDL Ratio: 5
Triglycerides: 260 mg/dL — ABNORMAL HIGH (ref 0.0–149.0)
VLDL: 52 mg/dL — ABNORMAL HIGH (ref 0.0–40.0)

## 2019-02-07 LAB — LDL CHOLESTEROL, DIRECT: Direct LDL: 161 mg/dL

## 2019-02-07 LAB — HEMOGLOBIN A1C: Hgb A1c MFr Bld: 5.8 % (ref 4.6–6.5)

## 2019-02-07 MED ORDER — FAMOTIDINE 10 MG PO TABS: 10.0000 mg | ORAL_TABLET | Freq: Every day | ORAL | 5 refills | Status: DC

## 2019-02-07 NOTE — Assessment & Plan Note (Signed)
Avoid offending foods, start probiotics. Do not eat large meals in late evening and consider raising head of bed. Pepcid and Tums prn

## 2019-02-07 NOTE — Assessment & Plan Note (Signed)
Well controlled, no changes to meds. Encouraged heart healthy diet such as the DASH diet and exercise as tolerated.  °

## 2019-02-07 NOTE — Patient Instructions (Signed)
shingrix is the new shot, 2 shots over 2-6 months  Hypertension, Adult High blood pressure (hypertension) is when the force of blood pumping through the arteries is too strong. The arteries are the blood vessels that carry blood from the heart throughout the body. Hypertension forces the heart to work harder to pump blood and may cause arteries to become narrow or stiff. Untreated or uncontrolled hypertension can cause a heart attack, heart failure, a stroke, kidney disease, and other problems. A blood pressure reading consists of a higher number over a lower number. Ideally, your blood pressure should be below 120/80. The first ("top") number is called the systolic pressure. It is a measure of the pressure in your arteries as your heart beats. The second ("bottom") number is called the diastolic pressure. It is a measure of the pressure in your arteries as the heart relaxes. What are the causes? The exact cause of this condition is not known. There are some conditions that result in or are related to high blood pressure. What increases the risk? Some risk factors for high blood pressure are under your control. The following factors may make you more likely to develop this condition:  Smoking.  Having type 2 diabetes mellitus, high cholesterol, or both.  Not getting enough exercise or physical activity.  Being overweight.  Having too much fat, sugar, calories, or salt (sodium) in your diet.  Drinking too much alcohol. Some risk factors for high blood pressure may be difficult or impossible to change. Some of these factors include:  Having chronic kidney disease.  Having a family history of high blood pressure.  Age. Risk increases with age.  Race. You may be at higher risk if you are African American.  Gender. Men are at higher risk than women before age 70. After age 81, women are at higher risk than men.  Having obstructive sleep apnea.  Stress. What are the signs or symptoms?  High blood pressure may not cause symptoms. Very high blood pressure (hypertensive crisis) may cause:  Headache.  Anxiety.  Shortness of breath.  Nosebleed.  Nausea and vomiting.  Vision changes.  Severe chest pain.  Seizures. How is this diagnosed? This condition is diagnosed by measuring your blood pressure while you are seated, with your arm resting on a flat surface, your legs uncrossed, and your feet flat on the floor. The cuff of the blood pressure monitor will be placed directly against the skin of your upper arm at the level of your heart. It should be measured at least twice using the same arm. Certain conditions can cause a difference in blood pressure between your right and left arms. Certain factors can cause blood pressure readings to be lower or higher than normal for a short period of time:  When your blood pressure is higher when you are in a health care provider's office than when you are at home, this is called white coat hypertension. Most people with this condition do not need medicines.  When your blood pressure is higher at home than when you are in a health care provider's office, this is called masked hypertension. Most people with this condition may need medicines to control blood pressure. If you have a high blood pressure reading during one visit or you have normal blood pressure with other risk factors, you may be asked to:  Return on a different day to have your blood pressure checked again.  Monitor your blood pressure at home for 1 week or longer. If  you are diagnosed with hypertension, you may have other blood or imaging tests to help your health care provider understand your overall risk for other conditions. How is this treated? This condition is treated by making healthy lifestyle changes, such as eating healthy foods, exercising more, and reducing your alcohol intake. Your health care provider may prescribe medicine if lifestyle changes are not  enough to get your blood pressure under control, and if:  Your systolic blood pressure is above 130.  Your diastolic blood pressure is above 80. Your personal target blood pressure may vary depending on your medical conditions, your age, and other factors. Follow these instructions at home: Eating and drinking   Eat a diet that is high in fiber and potassium, and low in sodium, added sugar, and fat. An example eating plan is called the DASH (Dietary Approaches to Stop Hypertension) diet. To eat this way: ? Eat plenty of fresh fruits and vegetables. Try to fill one half of your plate at each meal with fruits and vegetables. ? Eat whole grains, such as whole-wheat pasta, brown rice, or whole-grain bread. Fill about one fourth of your plate with whole grains. ? Eat or drink low-fat dairy products, such as skim milk or low-fat yogurt. ? Avoid fatty cuts of meat, processed or cured meats, and poultry with skin. Fill about one fourth of your plate with lean proteins, such as fish, chicken without skin, beans, eggs, or tofu. ? Avoid pre-made and processed foods. These tend to be higher in sodium, added sugar, and fat.  Reduce your daily sodium intake. Most people with hypertension should eat less than 1,500 mg of sodium a day.  Do not drink alcohol if: ? Your health care provider tells you not to drink. ? You are pregnant, may be pregnant, or are planning to become pregnant.  If you drink alcohol: ? Limit how much you use to:  0-1 drink a day for women.  0-2 drinks a day for men. ? Be aware of how much alcohol is in your drink. In the [place] one drink equals one 12 oz bottle of beer (355 mL), one 5 oz glass of wine (148 mL), or one 1 oz glass of hard liquor (44 mL). Lifestyle   Work with your health care provider to maintain a healthy body weight or to lose weight. Ask what an ideal weight is for you.  Get at least 30 minutes of exercise most days of the week. Activities may include  walking, swimming, or biking.  Include exercise to strengthen your muscles (resistance exercise), such as Pilates or lifting weights, as part of your weekly exercise routine. Try to do these types of exercises for 30 minutes at least 3 days a week.  Do not use any products that contain nicotine or tobacco, such as cigarettes, e-cigarettes, and chewing tobacco. If you need help quitting, ask your health care provider.  Monitor your blood pressure at home as told by your health care provider.  Keep all follow-up visits as told by your health care provider. This is important. Medicines  Take over-the-counter and prescription medicines only as told by your health care provider. Follow directions carefully. Blood pressure medicines must be taken as prescribed.  Do not skip doses of blood pressure medicine. Doing this puts you at risk for problems and can make the medicine less effective.  Ask your health care provider about side effects or reactions to medicines that you should watch for. Contact a health care provider if you:  Think you are having a reaction to a medicine you are taking.  Have headaches that keep coming back (recurring).  Feel dizzy.  Have swelling in your ankles.  Have trouble with your vision. Get help right away if you:  Develop a severe headache or confusion.  Have unusual weakness or numbness.  Feel faint.  Have severe pain in your chest or abdomen.  Vomit repeatedly.  Have trouble breathing. Summary  Hypertension is when the force of blood pumping through your arteries is too strong. If this condition is not controlled, it may put you at risk for serious complications.  Your personal target blood pressure may vary depending on your medical conditions, your age, and other factors. For most people, a normal blood pressure is less than 120/80.  Hypertension is treated with lifestyle changes, medicines, or a combination of both. Lifestyle changes include  losing weight, eating a healthy, low-sodium diet, exercising more, and limiting alcohol. This information is not intended to replace advice given to you by your health care provider. Make sure you discuss any questions you have with your health care provider. Document Released: 06/15/2005 Document Revised: 02/23/2018 Document Reviewed: 02/23/2018 Elsevier Patient Education  2020 [employer].

## 2019-02-07 NOTE — Assessment & Plan Note (Addendum)
Encouraged heart healthy diet, increase exercise, avoid trans fats, consider a krill oil cap daily is statin intolerant consider a referral to lipid clinic.

## 2019-02-07 NOTE — Assessment & Plan Note (Signed)
Asymptomatic, tolerating Eliquis 

## 2019-02-07 NOTE — Assessment & Plan Note (Signed)
hgba1c acceptable, minimize simple carbs. Increase exercise as tolerated. Continue current meds 

## 2019-02-07 NOTE — Progress Notes (Signed)
Subjective:    Patient ID: Sophia [name], female    DOB: 1941/01/23, 78 y.o.   MRN: [id number]  No chief complaint on file.   HPI Patient is in today for follow up on chronic medical concerns including hypertension, hyperlipidemia, afib and more. She notes overworking doing the weeding in the yard several weeks ago and spent about 2.5 weeks with 8 of 10 posterior right hip pain. She alternated ice and heat and now is back down to a tolerable amount of hip pain. No incontinence or radicular symptoms. She also notes some weight gain during the pandemic and as a result her heart burn has flared up some. She notes Tums help for a few hours but then return and are occurring 3-4 x a week. Denies CP/palp/SOB/HA/congestion/fevers or GU c/o. Taking meds as prescribed  Past Medical History:  Diagnosis Date  . Anxiety and depression   . Arthritis    hands, knees, etc  . Bladder prolapse, female, acquired 09/16/2014  . Bowen's disease   . Diverticulitis   . Diverticulosis   . GERD (gastroesophageal reflux disease)   . H/O hematuria    for several years, work only revealed kidney stone on right side  . History of chicken pox   . History of kidney stones   . Hyperlipidemia   . Hypertension   . Leg cramps, sleep related 04/02/2016  . Myalgia 04/25/2017  . Osteopenia 06/26/2014  . Osteoporosis 06/26/2014  . Plantar wart of [name] foot 10/15/2016  . Right shoulder pain 04/23/2017  . SCC (squamous cell carcinoma)    arms    Past Surgical History:  Procedure Laterality Date  . CHOLECYSTECTOMY  2005  . LOOP RECORDER INSERTION N/A 04/15/2018   Procedure: LOOP RECORDER INSERTION;  Surgeon: Sanda Klein, MD;  Location: [facility];  Service: Cardiovascular;  Laterality: N/A;  . SKIN BIOPSY     multiple    Family History  Problem Relation Age of Onset  . Heart disease Mother        aortic stenosis  . Hyperlipidemia Mother   . Hypertension Mother   . Heart attack Mother   .  Cancer Mother        BCC, SCC, skin  . Hypertension Father   . Cancer Father        SCC, BCC, skin  . Stroke Brother   . Early death Daughter   . Heart disease Maternal Grandfather        MI  . Tuberculosis Paternal Grandmother   . Cancer Paternal Grandfather        head and neck cancer, chewed tobacco  . Hypertension Daughter   . Heart disease Brother        arrythmia  . Hypertension Brother   . Hyperlipidemia Brother   . Birth defects Maternal Uncle     Social History   Socioeconomic History  . Marital status: Single    Spouse name: Not on file  . Number of children: Not on file  . Years of education: Not on file  . Highest education level: Not on file  Occupational History  . Not on file  Social Needs  . Financial resource strain: Not on file  . Food insecurity    Worry: Not on file    Inability: Not on file  . Transportation needs    Medical: Not on file    Non-medical: Not on file  Tobacco Use  . Smoking status: Never Smoker  . Smokeless  tobacco: Never Used  Substance and Sexual Activity  . Alcohol use: No    Alcohol/week: 0.0 standard drinks  . Drug use: No  . Sexual activity: Never    Comment: divorced, widowed, lives with elderly parents, is the caregiver, no dietary restrictions.   Lifestyle  . Physical activity    Days per week: Not on file    Minutes per session: Not on file  . Stress: Not on file  Relationships  . Social Herbalist on phone: Not on file    Gets together: Not on file    Attends religious service: Not on file    Active member of club or organization: Not on file    Attends meetings of clubs or organizations: Not on file    Relationship status: Not on file  . Intimate partner violence    Fear of current or ex partner: Not on file    Emotionally abused: Not on file    Physically abused: Not on file    Forced sexual activity: Not on file  Other Topics Concern  . Not on file  Social History Narrative  . Not on file     Outpatient Medications Prior to Visit  Medication Sig Dispense Refill  . acetaminophen (TYLENOL) 500 MG tablet Take 1,000 mg by mouth 2 (two) times daily as needed for moderate pain or headache.    . ALPRAZolam (XANAX) 0.25 MG tablet Take 1 tablet (0.25 mg total) by mouth daily as needed for anxiety or sleep. 30 tablet 1  . Calcium Carb-Cholecalciferol (CALCIUM 600 + D PO) Take 1 tablet by mouth daily.    . carvedilol (COREG) 12.5 MG tablet TAKE 1 TABLET(12.5 MG) BY MOUTH TWICE DAILY 180 tablet 3  . ELIQUIS 5 MG TABS tablet TAKE 1 TABLET BY MOUTH TWICE DAILY 180 tablet 1  . estradiol (ESTRACE) 0.1 MG/GM vaginal cream Place 1 Applicatorful vaginally 2 (two) times a week.    . Hypromellose (ARTIFICIAL TEARS OP) Place 1 drop into both eyes 2 (two) times daily.    . Probiotic Product (PROBIOTIC DAILY PO) Take 1 capsule by mouth daily.      No facility-administered medications prior to visit.     Allergies  Allergen Reactions  . Morphine And Related Swelling  . Statins Other (See Comments)    Myalgias, weakness  . Sulfa Antibiotics Swelling and Rash    Review of Systems  Constitutional: Negative for fever and malaise/fatigue.  HENT: Negative for congestion.   Eyes: Negative for blurred vision.  Respiratory: Negative for shortness of breath.   Cardiovascular: Negative for chest pain, palpitations and leg swelling.  Gastrointestinal: Negative for abdominal pain, blood in stool and nausea.  Genitourinary: Negative for dysuria and frequency.  Musculoskeletal: Negative for falls.  Skin: Negative for rash.  Neurological: Negative for dizziness, loss of consciousness and headaches.  Endo/Heme/Allergies: Negative for environmental allergies.  Psychiatric/Behavioral: Negative for depression. The patient is not nervous/anxious.        Objective:    Physical Exam Constitutional:      General: She is not in acute distress.    Appearance: She is not diaphoretic.  HENT:     Head:  Normocephalic and atraumatic.     Right Ear: External ear normal.     [name] Ear: External ear normal.     Nose: Nose normal.     Mouth/Throat:     Pharynx: No oropharyngeal exudate.  Eyes:     General: No scleral icterus.  Right eye: No discharge.        [name] eye: No discharge.     Conjunctiva/sclera: Conjunctivae normal.     Pupils: Pupils are equal, round, and reactive to light.  Neck:     Musculoskeletal: Normal range of motion and neck supple.     Thyroid: No thyromegaly.  Cardiovascular:     Rate and Rhythm: Normal rate and regular rhythm.     Heart sounds: Normal heart sounds. No murmur.  Pulmonary:     Effort: Pulmonary effort is normal. No respiratory distress.     Breath sounds: Normal breath sounds. No wheezing or rales.  Abdominal:     General: Bowel sounds are normal. There is no distension.     Palpations: Abdomen is soft. There is no mass.     Tenderness: There is no abdominal tenderness.  Musculoskeletal: Normal range of motion.        General: No tenderness.  Lymphadenopathy:     Cervical: No cervical adenopathy.  Skin:    General: Skin is warm and dry.     Findings: No rash.  Neurological:     Mental Status: She is alert and oriented to person, place, and time.     Cranial Nerves: No cranial nerve deficit.     Coordination: Coordination normal.     Deep Tendon Reflexes: Reflexes are normal and symmetric. Reflexes normal.     BP (!) 152/86 (BP Location: [name] Arm, Patient Position: Sitting, Cuff Size: Normal)   Pulse 69   Temp (!) 97.3 F (36.3 C) (Oral)   Resp 18   Wt 142 lb 9.6 oz (64.7 kg)   SpO2 98%   BMI 24.48 kg/m  Wt Readings from Last 3 Encounters:  02/07/19 142 lb 9.6 oz (64.7 kg)  11/07/18 139 lb (63 kg)  10/24/18 139 lb 8 oz (63.3 kg)    Diabetic Foot Exam - Simple   No data filed     Lab Results  Component Value Date   WBC 7.9 02/07/2019   HGB 13.0 02/07/2019   HCT 39.0 02/07/2019   PLT 163.0 02/07/2019   GLUCOSE 88  02/07/2019   CHOL 248 (H) 02/07/2019   TRIG 260.0 (H) 02/07/2019   HDL 48.40 02/07/2019   LDLDIRECT 161.0 02/07/2019   LDLCALC 120 (H) 04/26/2018   ALT 15 02/07/2019   AST 19 02/07/2019   NA 137 02/07/2019   K 3.9 02/07/2019   CL 102 02/07/2019   CREATININE 0.78 02/07/2019   BUN 13 02/07/2019   CO2 27 02/07/2019   TSH 1.59 02/07/2019   HGBA1C 5.8 02/07/2019    Lab Results  Component Value Date   TSH 1.59 02/07/2019   Lab Results  Component Value Date   WBC 7.9 02/07/2019   HGB 13.0 02/07/2019   HCT 39.0 02/07/2019   MCV 94.3 02/07/2019   PLT 163.0 02/07/2019   Lab Results  Component Value Date   NA 137 02/07/2019   K 3.9 02/07/2019   CO2 27 02/07/2019   GLUCOSE 88 02/07/2019   BUN 13 02/07/2019   CREATININE 0.78 02/07/2019   BILITOT 0.6 02/07/2019   ALKPHOS 69 02/07/2019   AST 19 02/07/2019   ALT 15 02/07/2019   PROT 6.8 02/07/2019   ALBUMIN 4.3 02/07/2019   CALCIUM 9.6 02/07/2019   ANIONGAP 10 02/22/2018   GFR 71.67 02/07/2019   Lab Results  Component Value Date   CHOL 248 (H) 02/07/2019   Lab Results  Component Value Date  HDL 48.40 02/07/2019   Lab Results  Component Value Date   LDLCALC 120 (H) 04/26/2018   Lab Results  Component Value Date   TRIG 260.0 (H) 02/07/2019   Lab Results  Component Value Date   CHOLHDL 5 02/07/2019   Lab Results  Component Value Date   HGBA1C 5.8 02/07/2019       Assessment & Plan:   Problem List Items Addressed This Visit    Hypertension - Primary    Well controlled, no changes to meds. Encouraged heart healthy diet such as the DASH diet and exercise as tolerated.       Relevant Orders   CBC (Completed)   Comprehensive metabolic panel (Completed)   TSH (Completed)   Hyperlipidemia    Encouraged heart healthy diet, increase exercise, avoid trans fats, consider a krill oil cap daily is statin intolerant consider a referral to lipid clinic.       Relevant Orders   Lipid panel (Completed)    Hyperglycemia    hgba1c acceptable, minimize simple carbs. Increase exercise as tolerated. Continue current meds      Relevant Orders   Hemoglobin A1c (Completed)   Atrial fibrillation (HCC)    Asymptomatic, tolerating Eliquis         I am having Sophia [name] start on famotidine. I am also having her maintain her Probiotic Product (PROBIOTIC DAILY PO), estradiol, Calcium Carb-Cholecalciferol (CALCIUM 600 + D PO), acetaminophen, Hypromellose (ARTIFICIAL TEARS OP), ALPRAZolam, carvedilol, and Eliquis.  Meds ordered this encounter  Medications  . famotidine (PEPCID) 10 MG tablet    Sig: Take 1 tablet (10 mg total) by mouth at bedtime.    Dispense:  30 tablet    Refill:  5     Penni Homans, MD

## 2019-02-09 DIAGNOSIS — N8111 Cystocele, midline: Secondary | ICD-10-CM | POA: Diagnosis not present

## 2019-02-09 DIAGNOSIS — N814 Uterovaginal prolapse, unspecified: Secondary | ICD-10-CM | POA: Diagnosis not present

## 2019-02-14 ENCOUNTER — Ambulatory Visit (INDEPENDENT_AMBULATORY_CARE_PROVIDER_SITE_OTHER): Payer: Medicare HMO | Admitting: *Deleted

## 2019-02-14 DIAGNOSIS — R55 Syncope and collapse: Secondary | ICD-10-CM | POA: Diagnosis not present

## 2019-02-14 LAB — CUP PACEART REMOTE DEVICE CHECK
Date Time Interrogation Session: 20200818113739
Implantable Pulse Generator Implant Date: 20191018

## 2019-02-23 NOTE — Progress Notes (Signed)
Carelink Summary Report / Loop Recorder 

## 2019-03-13 ENCOUNTER — Ambulatory Visit (INDEPENDENT_AMBULATORY_CARE_PROVIDER_SITE_OTHER): Payer: Medicare HMO | Admitting: *Deleted

## 2019-03-13 DIAGNOSIS — I480 Paroxysmal atrial fibrillation: Secondary | ICD-10-CM | POA: Diagnosis not present

## 2019-03-19 LAB — CUP PACEART REMOTE DEVICE CHECK
Date Time Interrogation Session: 20200920124222
Implantable Pulse Generator Implant Date: 20191018

## 2019-03-27 NOTE — Progress Notes (Signed)
Carelink Summary Report / Loop Recorder 

## 2019-04-05 ENCOUNTER — Ambulatory Visit: Payer: Medicare HMO

## 2019-04-05 ENCOUNTER — Telehealth: Payer: Self-pay | Admitting: Cardiovascular Disease

## 2019-04-05 NOTE — Telephone Encounter (Signed)
° ° ° °  Patient concerned about going to dermatologist, may have biopsy. She has questions regarding Eliquis. Advised patient to have dermatology office contact [facility] if any procedure is needed.  Patient requesting call from nurse

## 2019-04-05 NOTE — Telephone Encounter (Signed)
Returned call to patient. She is asking if it will be OK to hold eliquis for a skin biopsy. Advised that dermatology office will need to request clearance. She voiced understanding.

## 2019-04-06 DIAGNOSIS — L814 Other melanin hyperpigmentation: Secondary | ICD-10-CM | POA: Diagnosis not present

## 2019-04-06 DIAGNOSIS — D0439 Carcinoma in situ of skin of other parts of face: Secondary | ICD-10-CM | POA: Diagnosis not present

## 2019-04-06 DIAGNOSIS — D1801 Hemangioma of skin and subcutaneous tissue: Secondary | ICD-10-CM | POA: Diagnosis not present

## 2019-04-06 DIAGNOSIS — L821 Other seborrheic keratosis: Secondary | ICD-10-CM | POA: Diagnosis not present

## 2019-04-06 DIAGNOSIS — Z85828 Personal history of other malignant neoplasm of skin: Secondary | ICD-10-CM | POA: Diagnosis not present

## 2019-04-06 DIAGNOSIS — D044 Carcinoma in situ of skin of scalp and neck: Secondary | ICD-10-CM | POA: Diagnosis not present

## 2019-04-06 DIAGNOSIS — D485 Neoplasm of uncertain behavior of skin: Secondary | ICD-10-CM | POA: Diagnosis not present

## 2019-04-06 DIAGNOSIS — L570 Actinic keratosis: Secondary | ICD-10-CM | POA: Diagnosis not present

## 2019-04-06 DIAGNOSIS — D225 Melanocytic nevi of trunk: Secondary | ICD-10-CM | POA: Diagnosis not present

## 2019-04-06 DIAGNOSIS — L738 Other specified follicular disorders: Secondary | ICD-10-CM | POA: Diagnosis not present

## 2019-04-06 DIAGNOSIS — L720 Epidermal cyst: Secondary | ICD-10-CM | POA: Diagnosis not present

## 2019-04-12 ENCOUNTER — Ambulatory Visit (INDEPENDENT_AMBULATORY_CARE_PROVIDER_SITE_OTHER): Payer: Medicare HMO | Admitting: *Deleted

## 2019-04-12 ENCOUNTER — Other Ambulatory Visit: Payer: Self-pay

## 2019-04-12 DIAGNOSIS — Z23 Encounter for immunization: Secondary | ICD-10-CM

## 2019-04-12 NOTE — Progress Notes (Signed)
Patient here for flu vaccine.  Vaccine given in left deltoid and patient tolerated well. 

## 2019-04-18 ENCOUNTER — Telehealth: Payer: Self-pay | Admitting: *Deleted

## 2019-04-18 ENCOUNTER — Telehealth: Payer: Self-pay | Admitting: Cardiovascular Disease

## 2019-04-18 NOTE — Telephone Encounter (Signed)
°  Patient c/o Palpitations:  High priority if patient c/o lightheadedness, shortness of breath, or chest pain  1) How long have you had palpitations/irregular HR/ Afib? Are you having the symptoms now? yes  2) Are you currently experiencing lightheadedness, SOB or CP? no  3) Do you have a history of afib (atrial fibrillation) or irregular heart rhythm? yes  4) Have you checked your BP or HR? (document readings if available):   5) Are you experiencing any other symptoms? Slight discomfort and elevated bp  Daughter of the patient called. Dr. Loletha Grayer advised the patient to take an extra 1/2 a tablet of the carvedilol when she felt her heart was in afib. The last time the patient was in the office, the dosage of the medication doubled, and now she takes 12.5 mg 2x daily. The patient previously was taking 6.25 mg twice daily. The patient is now taking eliquis as well. The patient was not taking eliquis when the previous instructions were give.  The daughter would like to know if Dr. Loletha Grayer would still like to take the extra 1/2 a tablet to reduce her afib symptoms. The patient is just starting to have symptoms, and would like to keep the symptoms from getting worse. Please advise

## 2019-04-18 NOTE — Telephone Encounter (Signed)
Spoke with daughter regarding Eliquis patient assistance. She will bring her mothers portion to office to get faxed.

## 2019-04-18 NOTE — Telephone Encounter (Addendum)
Spoke with patients daughter and patient feels like she is having Afib episode. Blood pressure 175/91 HR 79 Discussed with Doreene Burke PA and advised to take an extra 1/2 Carvedilol  Advised daughter, verbalized understanding.

## 2019-04-20 MED ORDER — APIXABAN 5 MG PO TABS: 5.0000 mg | ORAL_TABLET | Freq: Two times a day (BID) | ORAL | 3 refills | Status: DC

## 2019-04-21 ENCOUNTER — Ambulatory Visit (INDEPENDENT_AMBULATORY_CARE_PROVIDER_SITE_OTHER): Payer: Medicare HMO | Admitting: *Deleted

## 2019-04-21 DIAGNOSIS — I480 Paroxysmal atrial fibrillation: Secondary | ICD-10-CM | POA: Diagnosis not present

## 2019-04-21 DIAGNOSIS — R55 Syncope and collapse: Secondary | ICD-10-CM

## 2019-04-22 LAB — CUP PACEART REMOTE DEVICE CHECK
Date Time Interrogation Session: 20201023124346
Implantable Pulse Generator Implant Date: 20191018

## 2019-04-28 NOTE — Progress Notes (Signed)
Carelink Summary Report / Loop Recorder 

## 2019-05-03 NOTE — Progress Notes (Signed)
Virtual Visit via Video Note  I connected with patient on 05/04/19 at  2:30 PM EST by audio enabled telemedicine application and verified that I am speaking with the correct person using two identifiers.   THIS ENCOUNTER IS A VIRTUAL VISIT DUE TO COVID-19 - PATIENT WAS NOT SEEN IN THE OFFICE. PATIENT HAS CONSENTED TO VIRTUAL VISIT / TELEMEDICINE VISIT   Location of patient: home  Location of provider: office  I discussed the limitations of evaluation and management by telemedicine and the availability of in person appointments. The patient expressed understanding and agreed to proceed.   Subjective:   Sophia [name] is a 78 y.o. female who presents for Medicare Annual (Subsequent) preventive examination.  Review of Systems:  Home Safety/Smoke Alarms: Feels safe in home. Smoke alarms in place.  Lives with dtr and her family.    Female:        Mammo-  Pt states she can not do any longer due to defibrillator.     Dexa scan-   12/23/17     CCS- No longer doing routine screening due to age.     Objective:     Vitals: Unable to assess. This visit is enabled though telemedicine due to Covid 19.   Advanced Directives 05/04/2019 02/22/2018 12/23/2017 12/15/2016 06/13/2015  Does Patient Have a Medical Advance Directive? Yes Yes Yes Yes Yes  Type of Paramedic of [place];Living will [place];Living will [place];Living will [place];Living will [place];Living will  Does patient want to make changes to medical advance directive? No - Patient declined - No - Patient declined - -  Copy of [employer] in Chart? No - copy requested - No - copy requested No - copy requested Yes    Tobacco Social History   Tobacco Use  Smoking Status Never Smoker  Smokeless Tobacco Never Used     Counseling given: Not Answered   Clinical Intake: Pain : No/denies pain      Past  Medical History:  Diagnosis Date  . Anxiety and depression   . Arthritis    hands, knees, etc  . Bladder prolapse, female, acquired 09/16/2014  . Bowen's disease   . Diverticulitis   . Diverticulosis   . GERD (gastroesophageal reflux disease)   . H/O hematuria    for several years, work only revealed kidney stone on right side  . History of chicken pox   . History of kidney stones   . Hyperlipidemia   . Hypertension   . Leg cramps, sleep related 04/02/2016  . Myalgia 04/25/2017  . Osteopenia 06/26/2014  . Osteoporosis 06/26/2014  . Plantar wart of left foot 10/15/2016  . Right shoulder pain 04/23/2017  . SCC (squamous cell carcinoma)    arms   Past Surgical History:  Procedure Laterality Date  . CHOLECYSTECTOMY  2005  . LOOP RECORDER INSERTION N/A 04/15/2018   Procedure: LOOP RECORDER INSERTION;  Surgeon: Sanda Klein, MD;  Location: [facility];  Service: Cardiovascular;  Laterality: N/A;  . SKIN BIOPSY     multiple   Family History  Problem Relation Age of Onset  . Heart disease Mother        aortic stenosis  . Hyperlipidemia Mother   . Hypertension Mother   . Heart attack Mother   . Cancer Mother        BCC, SCC, skin  . Hypertension Father   . Cancer Father  SCC, BCC, skin  . Stroke Brother   . Early death Daughter   . Heart disease Maternal Grandfather        MI  . Tuberculosis Paternal Grandmother   . Cancer Paternal Grandfather        head and neck cancer, chewed tobacco  . Hypertension Daughter   . Heart disease Brother        arrythmia  . Hypertension Brother   . Hyperlipidemia Brother   . Birth defects Maternal Uncle    Social History   Socioeconomic History  . Marital status: Single    Spouse name: Not on file  . Number of children: Not on file  . Years of education: Not on file  . Highest education level: Not on file  Occupational History  . Not on file  Social Needs  . Financial resource strain: Not on file  . Food  insecurity    Worry: Not on file    Inability: Not on file  . Transportation needs    Medical: Not on file    Non-medical: Not on file  Tobacco Use  . Smoking status: Never Smoker  . Smokeless tobacco: Never Used  Substance and Sexual Activity  . Alcohol use: No    Alcohol/week: 0.0 standard drinks  . Drug use: No  . Sexual activity: Never    Comment: divorced, widowed, lives with elderly parents, is the caregiver, no dietary restrictions.   Lifestyle  . Physical activity    Days per week: Not on file    Minutes per session: Not on file  . Stress: Not on file  Relationships  . Social Herbalist on phone: Not on file    Gets together: Not on file    Attends religious service: Not on file    Active member of club or organization: Not on file    Attends meetings of clubs or organizations: Not on file    Relationship status: Not on file  Other Topics Concern  . Not on file  Social History Narrative  . Not on file    Outpatient Encounter Medications as of 05/04/2019  Medication Sig  . acetaminophen (TYLENOL) 500 MG tablet Take 1,000 mg by mouth 2 (two) times daily as needed for moderate pain or headache.  [name] apixaban (ELIQUIS) 5 MG TABS tablet Take 1 tablet (5 mg total) by mouth 2 (two) times daily.  . Calcium Carb-Cholecalciferol (CALCIUM 600 + D PO) Take 1 tablet by mouth daily.  . carvedilol (COREG) 12.5 MG tablet TAKE 1 TABLET(12.5 MG) BY MOUTH TWICE DAILY  . estradiol (ESTRACE) 0.1 MG/GM vaginal cream Place 1 Applicatorful vaginally 2 (two) times a week.  . famotidine (PEPCID) 10 MG tablet Take 1 tablet (10 mg total) by mouth at bedtime.  . Hypromellose (ARTIFICIAL TEARS OP) Place 1 drop into both eyes 2 (two) times daily.  . Probiotic Product (PROBIOTIC DAILY PO) Take 1 capsule by mouth daily.   [name] ALPRAZolam (XANAX) 0.25 MG tablet Take 1 tablet (0.25 mg total) by mouth daily as needed for anxiety or sleep. (Patient not taking: Reported on 05/04/2019)   No  facility-administered encounter medications on file as of 05/04/2019.     Activities of Daily Living In your present state of health, do you have any difficulty performing the following activities: 05/04/2019  Hearing? N  Vision? N  Difficulty concentrating or making decisions? N  Walking or climbing stairs? N  Dressing or bathing? N  Doing errands, shopping?  N  Preparing Food and eating ? N  Using the Toilet? N  In the past six months, have you accidently leaked urine? N  Do you have problems with loss of bowel control? N  Managing your Medications? N  Managing your Finances? N  Housekeeping or managing your Housekeeping? N  Some recent data might be hidden    Patient Care Team: Mosie Lukes, MD as PCP - General (Family Medicine) Lorretta Harp, MD as PCP - Cardiology (Cardiology) Azucena Fallen, MD as Consulting Physician (Obstetrics and Gynecology) Rolm Bookbinder, MD as Consulting Physician (Dermatology) Katy Apo, MD as Consulting Physician (Ophthalmology)    Assessment:   This is a routine wellness examination for Sophia [name]. Physical assessment deferred to PCP.  Exercise Activities and Dietary recommendations Current Exercise Habits: Home exercise routine, Type of exercise: walking, Time (Minutes): 30, Intensity: Mild, Exercise limited by: None identified   Diet (meal preparation, eat out, water intake, caffeinated beverages, dairy products, fruits and vegetables): in general, a "healthy" diet  , well balanced    Goals    . Lose 5lbs--current wt is 140lb (pt-stated)    . Maintain current healthy active life.    . Schedule dermatologist and foot doctor. (pt-stated)    . Schedule eye and dental exams.   (pt-stated)       Fall Risk Fall Risk  05/04/2019 04/26/2018 12/23/2017 12/15/2016 04/02/2016  Falls in the past year? 0 No No No No  Number falls in past yr: 0 - - - -  Injury with Fall? 0 - - - -    Depression Screen PHQ 2/9 Scores 05/04/2019 04/26/2018  12/23/2017 12/15/2016  PHQ - 2 Score 0 0 0 0     Cognitive Function Ad8 score reviewed for issues:  Issues making decisions:no  Less interest in hobbies / activities:no  Repeats questions, stories (family complaining):no  Trouble using ordinary gadgets (microwave, computer, phone):no  Forgets the month or year: no  Mismanaging finances: no  Remembering appts:no  Daily problems with thinking and/or memory:no Ad8 score is=0     MMSE - Mini Mental State Exam 12/15/2016 06/13/2015  Orientation to time 5 5  Orientation to Place 5 5  Registration 3 3  Attention/ Calculation 5 5  Recall 3 3  Language- name 2 objects 2 2  Language- repeat 1 1  Language- follow 3 step command 3 3  Language- read & follow direction 1 1  Write a sentence 1 1  Copy design 1 1  Total score 30 30        Immunization History  Administered Date(s) Administered  . Fluad Quad(high Dose 65+) 04/12/2019  . Influenza, High Dose Seasonal PF 03/24/2017  . Influenza,inj,Quad PF,6+ Mos 03/19/2015  . Influenza-Unspecified 04/29/2014  . Tdap 12/10/2016   Screening Tests Health Maintenance  Topic Date Due  . TETANUS/TDAP  12/11/2026  . INFLUENZA VACCINE  Completed  . DEXA SCAN  Completed  . PNA vac Low Risk Adult  Completed      Plan:    Please schedule your next medicare wellness visit with me in 1 yr.  Continue to eat heart healthy diet (full of fruits, vegetables, whole grains, lean protein, water--limit salt, fat, and sugar intake) and increase physical activity as tolerated.  Continue doing brain stimulating activities (puzzles, reading, adult coloring books, staying active) to keep memory sharp.    I have personally reviewed and noted the following in the patient's chart:   . Medical and social history . Use  of alcohol, tobacco or illicit drugs  . Current medications and supplements . Functional ability and status . Nutritional status . Physical activity . Advanced directives .  List of other physicians . Hospitalizations, surgeries, and ER visits in previous 12 months . Vitals . Screenings to include cognitive, depression, and falls . Referrals and appointments  In addition, I have reviewed and discussed with patient certain preventive protocols, quality metrics, and best practice recommendations. A written personalized care plan for preventive services as well as general preventive health recommendations were provided to patient.     Shela Nevin, [place]  05/04/2019

## 2019-05-04 ENCOUNTER — Ambulatory Visit (INDEPENDENT_AMBULATORY_CARE_PROVIDER_SITE_OTHER): Payer: Medicare HMO | Admitting: *Deleted

## 2019-05-04 ENCOUNTER — Other Ambulatory Visit: Payer: Self-pay

## 2019-05-04 ENCOUNTER — Encounter: Payer: Self-pay | Admitting: *Deleted

## 2019-05-04 DIAGNOSIS — Z0000 Encounter for general adult medical examination without abnormal findings: Secondary | ICD-10-CM

## 2019-05-04 NOTE — Patient Instructions (Signed)
Please schedule your next medicare wellness visit with me in 1 yr.  Continue to eat heart healthy diet (full of fruits, vegetables, whole grains, lean protein, water--limit salt, fat, and sugar intake) and increase physical activity as tolerated.  Continue doing brain stimulating activities (puzzles, reading, adult coloring books, staying active) to keep memory sharp.    Sophia [name] , Thank you for taking time to come for your Medicare Wellness Visit. I appreciate your ongoing commitment to your health goals. Please review the following plan we discussed and let me know if I can assist you in the future.   These are the goals we discussed: Goals    . Lose 5lbs--current wt is 140lb (pt-stated)    . Maintain current healthy active life.    . Schedule dermatologist and foot doctor. (pt-stated)    . Schedule eye and dental exams.   (pt-stated)       This is a list of the screening recommended for you and due dates:  Health Maintenance  Topic Date Due  . Tetanus Vaccine  12/11/2026  . Flu Shot  Completed  . DEXA scan (bone density measurement)  Completed  . Pneumonia vaccines  Completed    Preventive Care 65 Years and Older, Female Preventive care refers to lifestyle choices and visits with your health care provider that can promote health and wellness. This includes:  A yearly physical exam. This is also called an annual well check.  Regular dental and eye exams.  Immunizations.  Screening for certain conditions.  Healthy lifestyle choices, such as diet and exercise. What can I expect for my preventive care visit? Physical exam Your health care provider will check:  Height and weight. These may be used to calculate body mass index (BMI), which is a measurement that tells if you are at a healthy weight.  Heart rate and blood pressure.  Your skin for abnormal spots. Counseling Your health care provider may ask you questions about:  Alcohol, tobacco, and drug use.   Emotional well-being.  Home and relationship well-being.  Sexual activity.  Eating habits.  History of falls.  Memory and ability to understand (cognition).  Work and work environment.  Pregnancy and menstrual history. What immunizations do I need?  Influenza (flu) vaccine  This is recommended every year. Tetanus, diphtheria, and pertussis (Tdap) vaccine  You may need a Td booster every 10 years. Varicella (chickenpox) vaccine  You may need this vaccine if you have not already been vaccinated. Zoster (shingles) vaccine  You may need this after age 60. Pneumococcal conjugate (PCV13) vaccine  One dose is recommended after age 65. Pneumococcal polysaccharide (PPSV23) vaccine  One dose is recommended after age 65. Measles, mumps, and rubella (MMR) vaccine  You may need at least one dose of MMR if you were born in 1957 or later. You may also need a second dose. Meningococcal conjugate (MenACWY) vaccine  You may need this if you have certain conditions. Hepatitis A vaccine  You may need this if you have certain conditions or if you travel or work in places where you may be exposed to hepatitis A. Hepatitis B vaccine  You may need this if you have certain conditions or if you travel or work in places where you may be exposed to hepatitis B. Haemophilus influenzae type b (Hib) vaccine  You may need this if you have certain conditions. You may receive vaccines as individual doses or as more than one vaccine together in one shot (combination vaccines). Talk   with your health care provider about the risks and benefits of combination vaccines. What tests do I need? Blood tests  Lipid and cholesterol levels. These may be checked every 5 years, or more frequently depending on your overall health.  Hepatitis C test.  Hepatitis B test. Screening  Lung cancer screening. You may have this screening every year starting at age 55 if you have a 30-pack-year history of smoking  and currently smoke or have quit within the past 15 years.  Colorectal cancer screening. All adults should have this screening starting at age 50 and continuing until age 75. Your health care provider may recommend screening at age 45 if you are at increased risk. You will have tests every 1-10 years, depending on your results and the type of screening test.  Diabetes screening. This is done by checking your blood sugar (glucose) after you have not eaten for a while (fasting). You may have this done every 1-3 years.  Mammogram. This may be done every 1-2 years. Talk with your health care provider about how often you should have regular mammograms.  BRCA-related cancer screening. This may be done if you have a family history of breast, ovarian, tubal, or peritoneal cancers. Other tests  Sexually transmitted disease (STD) testing.  Bone density scan. This is done to screen for osteoporosis. You may have this done starting at age 65. Follow these instructions at home: Eating and drinking  Eat a diet that includes fresh fruits and vegetables, whole grains, lean protein, and low-fat dairy products. Limit your intake of foods with high amounts of sugar, saturated fats, and salt.  Take vitamin and mineral supplements as recommended by your health care provider.  Do not drink alcohol if your health care provider tells you not to drink.  If you drink alcohol: ? Limit how much you have to 0-1 drink a day. ? Be aware of how much alcohol is in your drink. In the [place] one drink equals one 12 oz bottle of beer (355 mL), one 5 oz glass of wine (148 mL), or one 1 oz glass of hard liquor (44 mL). Lifestyle  Take daily care of your teeth and gums.  Stay active. Exercise for at least 30 minutes on 5 or more days each week.  Do not use any products that contain nicotine or tobacco, such as cigarettes, e-cigarettes, and chewing tobacco. If you need help quitting, ask your health care provider.  If you  are sexually active, practice safe sex. Use a condom or other form of protection in order to prevent STIs (sexually transmitted infections).  Talk with your health care provider about taking a low-dose aspirin or statin. What's next?  Go to your health care provider once a year for a well check visit.  Ask your health care provider how often you should have your eyes and teeth checked.  Stay up to date on all vaccines. This information is not intended to replace advice given to you by your health care provider. Make sure you discuss any questions you have with your health care provider. Document Released: 07/12/2015 Document Revised: 06/09/2018 Document Reviewed: 06/09/2018 Elsevier Patient Education  2020 [employer]  

## 2019-05-09 DIAGNOSIS — D044 Carcinoma in situ of skin of scalp and neck: Secondary | ICD-10-CM | POA: Diagnosis not present

## 2019-05-09 DIAGNOSIS — Z85828 Personal history of other malignant neoplasm of skin: Secondary | ICD-10-CM | POA: Diagnosis not present

## 2019-05-24 ENCOUNTER — Ambulatory Visit (INDEPENDENT_AMBULATORY_CARE_PROVIDER_SITE_OTHER): Payer: Medicare HMO | Admitting: *Deleted

## 2019-05-24 DIAGNOSIS — R002 Palpitations: Secondary | ICD-10-CM

## 2019-05-24 LAB — CUP PACEART REMOTE DEVICE CHECK
Date Time Interrogation Session: 20201125074544
Implantable Pulse Generator Implant Date: 20191018

## 2019-06-20 NOTE — Progress Notes (Signed)
ILR remote 

## 2019-06-26 ENCOUNTER — Ambulatory Visit (INDEPENDENT_AMBULATORY_CARE_PROVIDER_SITE_OTHER): Payer: Medicare HMO | Admitting: *Deleted

## 2019-06-26 DIAGNOSIS — I480 Paroxysmal atrial fibrillation: Secondary | ICD-10-CM | POA: Diagnosis not present

## 2019-06-26 LAB — CUP PACEART REMOTE DEVICE CHECK
Date Time Interrogation Session: 20201228074758
Implantable Pulse Generator Implant Date: 20191018

## 2019-07-27 ENCOUNTER — Ambulatory Visit (INDEPENDENT_AMBULATORY_CARE_PROVIDER_SITE_OTHER): Payer: Medicare HMO | Admitting: *Deleted

## 2019-07-27 DIAGNOSIS — I480 Paroxysmal atrial fibrillation: Secondary | ICD-10-CM | POA: Diagnosis not present

## 2019-07-27 LAB — CUP PACEART REMOTE DEVICE CHECK
Date Time Interrogation Session: 20210128075145
Implantable Pulse Generator Implant Date: 20191018

## 2019-07-28 NOTE — Progress Notes (Signed)
ILR Remote 

## 2019-08-02 ENCOUNTER — Other Ambulatory Visit: Payer: Self-pay

## 2019-08-02 MED ORDER — APIXABAN 5 MG PO TABS: 5.0000 mg | ORAL_TABLET | Freq: Two times a day (BID) | ORAL | 3 refills | Status: DC

## 2019-08-02 MED ORDER — APIXABAN 5 MG PO TABS: 5.0000 mg | ORAL_TABLET | Freq: Two times a day (BID) | ORAL | 1 refills | Status: DC

## 2019-08-10 ENCOUNTER — Ambulatory Visit (INDEPENDENT_AMBULATORY_CARE_PROVIDER_SITE_OTHER): Payer: Medicare HMO | Admitting: Family Medicine

## 2019-08-10 ENCOUNTER — Other Ambulatory Visit: Payer: Self-pay

## 2019-08-10 ENCOUNTER — Ambulatory Visit: Payer: Medicare HMO | Admitting: Family Medicine

## 2019-08-10 VITALS — BP 134/85 | HR 61 | Ht 61.0 in | Wt 143.0 lb

## 2019-08-10 DIAGNOSIS — E7800 Pure hypercholesterolemia, unspecified: Secondary | ICD-10-CM

## 2019-08-10 DIAGNOSIS — N8110 Cystocele, unspecified: Secondary | ICD-10-CM

## 2019-08-10 DIAGNOSIS — M810 Age-related osteoporosis without current pathological fracture: Secondary | ICD-10-CM | POA: Diagnosis not present

## 2019-08-10 DIAGNOSIS — I10 Essential (primary) hypertension: Secondary | ICD-10-CM

## 2019-08-10 DIAGNOSIS — I480 Paroxysmal atrial fibrillation: Secondary | ICD-10-CM

## 2019-08-10 DIAGNOSIS — R739 Hyperglycemia, unspecified: Secondary | ICD-10-CM

## 2019-08-10 MED ORDER — CARVEDILOL 12.5 MG PO TABS: ORAL_TABLET | ORAL | 1 refills | Status: DC

## 2019-08-10 NOTE — Progress Notes (Signed)
Virtual Visit via Video Note  I connected with Sophia [name] on 08/10/19 at 11:00 AM EST by a video enabled telemedicine application and verified that I am speaking with the correct person using two identifiers.  Location: Patient: home Provider: office   I discussed the limitations of evaluation and management by telemedicine and the availability of in person appointments. The patient expressed understanding and agreed to proceed. Marin Roberts, CMA was able to get the patient set up on a video visit.    Subjective:    Patient ID: Sophia [name], female    DOB: [birth year], 79 y.o.   MRN: [id number]  Chief Complaint  Patient presents with  . Hypertension    Follow up    HPI Patient is in today for follow up. No recent febrile illness or hospitalizations. She is staying home and maintaining quarantine. She has not chosen to take her COVID shots yet. She is trying to maintain a heart healthy diet and stays active. She is noting her BP is trending up for no apparent reason and asymptomatic. Denies CP/palp/SOB/HA/congestion/fevers/GI or GU c/o. Taking meds as prescribed  Past Medical History:  Diagnosis Date  . Anxiety and depression   . Arthritis    hands, knees, etc  . Bladder prolapse, female, acquired 09/16/2014  . Bowen's disease   . Diverticulitis   . Diverticulosis   . GERD (gastroesophageal reflux disease)   . H/O hematuria    for several years, work only revealed kidney stone on right side  . History of chicken pox   . History of kidney stones   . Hyperlipidemia   . Hypertension   . Leg cramps, sleep related 04/02/2016  . Myalgia 04/25/2017  . Osteopenia 06/26/2014  . Osteoporosis 06/26/2014  . Plantar wart of [name] foot 10/15/2016  . Right shoulder pain 04/23/2017  . SCC (squamous cell carcinoma)    arms    Past Surgical History:  Procedure Laterality Date  . CHOLECYSTECTOMY  2005  . LOOP RECORDER INSERTION N/A 04/15/2018   Procedure: LOOP RECORDER  INSERTION;  Surgeon: Sanda Klein, MD;  Location: [facility];  Service: Cardiovascular;  Laterality: N/A;  . SKIN BIOPSY     multiple    Family History  Problem Relation Age of Onset  . Heart disease Mother        aortic stenosis  . Hyperlipidemia Mother   . Hypertension Mother   . Heart attack Mother   . Cancer Mother        BCC, SCC, skin  . Hypertension Father   . Cancer Father        SCC, BCC, skin  . Stroke Brother   . Early death Daughter   . Heart disease Maternal Grandfather        MI  . Tuberculosis Paternal Grandmother   . Cancer Paternal Grandfather        head and neck cancer, chewed tobacco  . Hypertension Daughter   . Heart disease Brother        arrythmia  . Hypertension Brother   . Hyperlipidemia Brother   . Birth defects Maternal Uncle     Social History   Socioeconomic History  . Marital status: Single    Spouse name: Not on file  . Number of children: Not on file  . Years of education: Not on file  . Highest education level: Not on file  Occupational History  . Not on file  Tobacco Use  . Smoking status: Never  Smoker  . Smokeless tobacco: Never Used  Substance and Sexual Activity  . Alcohol use: No    Alcohol/week: 0.0 standard drinks  . Drug use: No  . Sexual activity: Never    Comment: divorced, widowed, lives with elderly parents, is the caregiver, no dietary restrictions.   Other Topics Concern  . Not on file  Social History Narrative  . Not on file   Social Determinants of Health   Financial Resource Strain:   . Difficulty of Paying Living Expenses: Not on file  Food Insecurity:   . Worried About Charity fundraiser in the Last Year: Not on file  . Ran Out of Food in the Last Year: Not on file  Transportation Needs:   . Lack of Transportation (Medical): Not on file  . Lack of Transportation (Non-Medical): Not on file  Physical Activity:   . Days of Exercise per Week: Not on file  . Minutes of Exercise per Session:  Not on file  Stress:   . Feeling of Stress : Not on file  Social Connections:   . Frequency of Communication with Friends and Family: Not on file  . Frequency of Social Gatherings with Friends and Family: Not on file  . Attends Religious Services: Not on file  . Active Member of Clubs or Organizations: Not on file  . Attends Archivist Meetings: Not on file  . Marital Status: Not on file  Intimate Partner Violence:   . Fear of Current or Ex-Partner: Not on file  . Emotionally Abused: Not on file  . Physically Abused: Not on file  . Sexually Abused: Not on file    Outpatient Medications Prior to Visit  Medication Sig Dispense Refill  . acetaminophen (TYLENOL) 500 MG tablet Take 1,000 mg by mouth 2 (two) times daily as needed for moderate pain or headache.    [name] apixaban (ELIQUIS) 5 MG TABS tablet Take 1 tablet (5 mg total) by mouth 2 (two) times daily. 180 tablet 1  . Calcium Carb-Cholecalciferol (CALCIUM 600 + D PO) Take 1 tablet by mouth daily.    [name] estradiol (ESTRACE) 0.1 MG/GM vaginal cream Place 1 Applicatorful vaginally 2 (two) times a week.    . famotidine (PEPCID) 10 MG tablet Take 1 tablet (10 mg total) by mouth at bedtime. 30 tablet 5  . Hypromellose (ARTIFICIAL TEARS OP) Place 1 drop into both eyes 2 (two) times daily.    . Probiotic Product (PROBIOTIC DAILY PO) Take 1 capsule by mouth daily.     . carvedilol (COREG) 12.5 MG tablet TAKE 1 TABLET(12.5 MG) BY MOUTH TWICE DAILY 180 tablet 3  . ALPRAZolam (XANAX) 0.25 MG tablet Take 1 tablet (0.25 mg total) by mouth daily as needed for anxiety or sleep. (Patient not taking: Reported on 08/10/2019) 30 tablet 1   No facility-administered medications prior to visit.    Allergies  Allergen Reactions  . Morphine And Related Swelling  . Statins Other (See Comments)    Myalgias, weakness  . Sulfa Antibiotics Swelling and Rash    Review of Systems  Constitutional: Negative for fever and malaise/fatigue.  HENT: Negative  for congestion.   Eyes: Negative for blurred vision.  Respiratory: Negative for shortness of breath.   Cardiovascular: Negative for chest pain, palpitations and leg swelling.  Gastrointestinal: Negative for abdominal pain, blood in stool and nausea.  Genitourinary: Negative for dysuria and frequency.  Musculoskeletal: Negative for falls.  Skin: Negative for rash.  Neurological: Negative for dizziness, loss  of consciousness and headaches.  Endo/Heme/Allergies: Negative for environmental allergies.  Psychiatric/Behavioral: Negative for depression. The patient is not nervous/anxious.        Objective:    Physical Exam Constitutional:      Appearance: Normal appearance. She is not ill-appearing.  HENT:     Head: Normocephalic and atraumatic.  Eyes:     General:        Right eye: No discharge.        [name] eye: No discharge.  Pulmonary:     Effort: Pulmonary effort is normal.  Neurological:     Mental Status: She is alert and oriented to person, place, and time.  Psychiatric:        Behavior: Behavior normal.     BP 134/85 (BP Location: [name] Arm, Patient Position: Sitting)   Pulse 61   Ht 5\' 1"  (1.549 m)   Wt 143 lb (64.9 kg)   SpO2 99%   BMI 27.02 kg/m  Wt Readings from Last 3 Encounters:  08/10/19 143 lb (64.9 kg)  02/07/19 142 lb 9.6 oz (64.7 kg)  11/07/18 139 lb (63 kg)    Diabetic Foot Exam - Simple   No data filed     Lab Results  Component Value Date   WBC 7.9 02/07/2019   HGB 13.0 02/07/2019   HCT 39.0 02/07/2019   PLT 163.0 02/07/2019   GLUCOSE 88 02/07/2019   CHOL 248 (H) 02/07/2019   TRIG 260.0 (H) 02/07/2019   HDL 48.40 02/07/2019   LDLDIRECT 161.0 02/07/2019   LDLCALC 120 (H) 04/26/2018   ALT 15 02/07/2019   AST 19 02/07/2019   NA 137 02/07/2019   K 3.9 02/07/2019   CL 102 02/07/2019   CREATININE 0.78 02/07/2019   BUN 13 02/07/2019   CO2 27 02/07/2019   TSH 1.59 02/07/2019   HGBA1C 5.8 02/07/2019    Lab Results  Component Value Date    TSH 1.59 02/07/2019   Lab Results  Component Value Date   WBC 7.9 02/07/2019   HGB 13.0 02/07/2019   HCT 39.0 02/07/2019   MCV 94.3 02/07/2019   PLT 163.0 02/07/2019   Lab Results  Component Value Date   NA 137 02/07/2019   K 3.9 02/07/2019   CO2 27 02/07/2019   GLUCOSE 88 02/07/2019   BUN 13 02/07/2019   CREATININE 0.78 02/07/2019   BILITOT 0.6 02/07/2019   ALKPHOS 69 02/07/2019   AST 19 02/07/2019   ALT 15 02/07/2019   PROT 6.8 02/07/2019   ALBUMIN 4.3 02/07/2019   CALCIUM 9.6 02/07/2019   ANIONGAP 10 02/22/2018   GFR 71.67 02/07/2019   Lab Results  Component Value Date   CHOL 248 (H) 02/07/2019   Lab Results  Component Value Date   HDL 48.40 02/07/2019   Lab Results  Component Value Date   LDLCALC 120 (H) 04/26/2018   Lab Results  Component Value Date   TRIG 260.0 (H) 02/07/2019   Lab Results  Component Value Date   CHOLHDL 5 02/07/2019   Lab Results  Component Value Date   HGBA1C 5.8 02/07/2019       Assessment & Plan:   Problem List Items Addressed This Visit    Hypertension - Primary    Mildly elevated over the the past month or so, systolics in the 0000000 and 150s highest number noted at 162. will alter medications, encouraged DASH diet, minimize caffeine and obtain adequate sleep. Report concerning symptoms and follow up as directed and as needed.  Increase Carvedilol from 12.5 mg bid to 12.5 mg bid and 6.25 mg q noon      Relevant Medications   carvedilol (COREG) 12.5 MG tablet   Other Relevant Orders   CBC   Comprehensive metabolic panel   TSH   Hyperlipidemia    Encouraged heart healthy diet, increase exercise, avoid trans fats, consider a krill oil cap daily      Relevant Medications   carvedilol (COREG) 12.5 MG tablet   Other Relevant Orders   Lipid panel   TSH   Osteoporosis    Encouraged to get adequate exercise, calcium and vitamin d intake. Monitor vitamin D levels, orders placed      Relevant Orders   TSH   Vitamin D  (25 hydroxy)   Bladder prolapse, female, acquired   Relevant Orders   TSH   Hyperglycemia   Relevant Orders   TSH   Hemoglobin A1c   Atrial fibrillation ([place])    Following with cardiology, tolerating Eliquis and asymptomatic.       Relevant Medications   carvedilol (COREG) 12.5 MG tablet      I have changed Rasheema L. Chiasson's carvedilol. I am also having her maintain her Probiotic Product (PROBIOTIC DAILY PO), estradiol, Calcium Carb-Cholecalciferol (CALCIUM 600 + D PO), acetaminophen, Hypromellose (ARTIFICIAL TEARS OP), ALPRAZolam, famotidine, and apixaban.  Meds ordered this encounter  Medications  . carvedilol (COREG) 12.5 MG tablet    Sig: 12.5 mg po bid and 6.25 mg q noon    Dispense:  225 tablet    Refill:  1    I discussed the assessment and treatment plan with the patient. The patient was provided an opportunity to ask questions and all were answered. The patient agreed with the plan and demonstrated an understanding of the instructions.   The patient was advised to call back or seek an in-person evaluation if the symptoms worsen or if the condition fails to improve as anticipated.  I provided 25 minutes of non-face-to-face time during this encounter.   Penni Homans, MD

## 2019-08-10 NOTE — Patient Instructions (Signed)
[facility] Blood Pressure cuff, upper arm, want BP 100-140/60-90 Pulse oximeter, want oxygen in 90s  Weekly vitals  Take Multivitamin with minerals, selenium Vitamin D 1000-2000 IU daily Probiotic with lactobacillus and bifidophilus Asprin EC 81 mg daily  Melatonin 2-5 mg at bedtime  [email or web address] 

## 2019-08-10 NOTE — Assessment & Plan Note (Signed)
Mildly elevated over the the past month or so, systolics in the 0000000 and 150s highest number noted at 162. will alter medications, encouraged DASH diet, minimize caffeine and obtain adequate sleep. Report concerning symptoms and follow up as directed and as needed. Increase Carvedilol from 12.5 mg bid to 12.5 mg bid and 6.25 mg q noon

## 2019-08-10 NOTE — Assessment & Plan Note (Signed)
Encouraged heart healthy diet, increase exercise, avoid trans fats, consider a krill oil cap daily 

## 2019-08-10 NOTE — Assessment & Plan Note (Signed)
Encouraged to get adequate exercise, calcium and vitamin d intake. Monitor vitamin D levels, orders placed

## 2019-08-10 NOTE — Assessment & Plan Note (Signed)
Following with cardiology, tolerating Eliquis and asymptomatic.

## 2019-08-21 DIAGNOSIS — H25013 Cortical age-related cataract, bilateral: Secondary | ICD-10-CM | POA: Diagnosis not present

## 2019-08-21 DIAGNOSIS — H52203 Unspecified astigmatism, bilateral: Secondary | ICD-10-CM | POA: Diagnosis not present

## 2019-08-21 DIAGNOSIS — H04123 Dry eye syndrome of bilateral lacrimal glands: Secondary | ICD-10-CM | POA: Diagnosis not present

## 2019-08-21 DIAGNOSIS — H2513 Age-related nuclear cataract, bilateral: Secondary | ICD-10-CM | POA: Diagnosis not present

## 2019-08-28 ENCOUNTER — Ambulatory Visit (INDEPENDENT_AMBULATORY_CARE_PROVIDER_SITE_OTHER): Payer: Medicare HMO | Admitting: *Deleted

## 2019-08-28 DIAGNOSIS — I480 Paroxysmal atrial fibrillation: Secondary | ICD-10-CM | POA: Diagnosis not present

## 2019-08-28 LAB — CUP PACEART REMOTE DEVICE CHECK
Date Time Interrogation Session: 20210228235127
Implantable Pulse Generator Implant Date: 20191018

## 2019-08-28 NOTE — Progress Notes (Signed)
ILR Remote 

## 2019-09-06 DIAGNOSIS — L821 Other seborrheic keratosis: Secondary | ICD-10-CM | POA: Diagnosis not present

## 2019-09-06 DIAGNOSIS — C44329 Squamous cell carcinoma of skin of other parts of face: Secondary | ICD-10-CM | POA: Diagnosis not present

## 2019-09-06 DIAGNOSIS — Z85828 Personal history of other malignant neoplasm of skin: Secondary | ICD-10-CM | POA: Diagnosis not present

## 2019-09-06 DIAGNOSIS — D485 Neoplasm of uncertain behavior of skin: Secondary | ICD-10-CM | POA: Diagnosis not present

## 2019-09-06 DIAGNOSIS — D2239 Melanocytic nevi of other parts of face: Secondary | ICD-10-CM | POA: Diagnosis not present

## 2019-09-06 DIAGNOSIS — L570 Actinic keratosis: Secondary | ICD-10-CM | POA: Diagnosis not present

## 2019-09-11 ENCOUNTER — Encounter: Payer: Self-pay | Admitting: Family Medicine

## 2019-09-11 ENCOUNTER — Other Ambulatory Visit: Payer: Self-pay

## 2019-09-11 ENCOUNTER — Other Ambulatory Visit (INDEPENDENT_AMBULATORY_CARE_PROVIDER_SITE_OTHER): Payer: Medicare HMO

## 2019-09-11 DIAGNOSIS — M810 Age-related osteoporosis without current pathological fracture: Secondary | ICD-10-CM | POA: Diagnosis not present

## 2019-09-11 DIAGNOSIS — Z789 Other specified health status: Secondary | ICD-10-CM

## 2019-09-11 DIAGNOSIS — R739 Hyperglycemia, unspecified: Secondary | ICD-10-CM | POA: Diagnosis not present

## 2019-09-11 DIAGNOSIS — E7800 Pure hypercholesterolemia, unspecified: Secondary | ICD-10-CM

## 2019-09-11 DIAGNOSIS — N8110 Cystocele, unspecified: Secondary | ICD-10-CM

## 2019-09-11 DIAGNOSIS — I10 Essential (primary) hypertension: Secondary | ICD-10-CM

## 2019-09-11 HISTORY — DX: Other specified health status: Z78.9

## 2019-09-11 LAB — LIPID PANEL
Cholesterol: 260 mg/dL — ABNORMAL HIGH (ref 0–200)
HDL: 46 mg/dL (ref >39.00)
NonHDL: 214.06
Total CHOL/HDL Ratio: 6
Triglycerides: 268 mg/dL — ABNORMAL HIGH (ref 0.0–149.0)
VLDL: 53.6 mg/dL — ABNORMAL HIGH (ref 0.0–40.0)

## 2019-09-11 LAB — COMPREHENSIVE METABOLIC PANEL
ALT: 12 U/L (ref 0–35)
AST: 18 U/L (ref 0–37)
Albumin: 4.1 g/dL (ref 3.5–5.2)
Alkaline Phosphatase: 70 U/L (ref 39–117)
BUN: 14 mg/dL (ref 6–23)
CO2: 27 mEq/L (ref 19–32)
Calcium: 9.2 mg/dL (ref 8.4–10.5)
Chloride: 101 mEq/L (ref 96–112)
Creatinine, Ser: 0.86 mg/dL (ref 0.40–1.20)
GFR: 63.93 mL/min (ref >60.00)
Glucose, Bld: 95 mg/dL (ref 70–99)
Potassium: 4.1 mEq/L (ref 3.5–5.1)
Sodium: 137 mEq/L (ref 135–145)
Total Bilirubin: 0.6 mg/dL (ref 0.2–1.2)
Total Protein: 6.5 g/dL (ref 6.0–8.3)

## 2019-09-11 LAB — TSH: TSH: 2.35 u[IU]/mL (ref 0.35–4.50)

## 2019-09-11 LAB — CBC
HCT: 39.1 % (ref 36.0–46.0)
Hemoglobin: 13 g/dL (ref 12.0–15.0)
MCHC: 33.3 g/dL (ref 30.0–36.0)
MCV: 94.4 fl (ref 78.0–100.0)
Platelets: 162 10*3/uL (ref 150.0–400.0)
RBC: 4.14 Mil/uL (ref 3.87–5.11)
RDW: 13.9 % (ref 11.5–15.5)
WBC: 7.1 10*3/uL (ref 4.0–10.5)

## 2019-09-11 LAB — HEMOGLOBIN A1C: Hgb A1c MFr Bld: 5.7 % (ref 4.6–6.5)

## 2019-09-11 LAB — LDL CHOLESTEROL, DIRECT: Direct LDL: 161 mg/dL

## 2019-09-11 LAB — VITAMIN D 25 HYDROXY (VIT D DEFICIENCY, FRACTURES): VITD: 30.23 ng/mL (ref 30.00–100.00)

## 2019-09-13 DIAGNOSIS — D0439 Carcinoma in situ of skin of other parts of face: Secondary | ICD-10-CM | POA: Diagnosis not present

## 2019-09-13 DIAGNOSIS — C44329 Squamous cell carcinoma of skin of other parts of face: Secondary | ICD-10-CM | POA: Diagnosis not present

## 2019-09-13 DIAGNOSIS — Z85828 Personal history of other malignant neoplasm of skin: Secondary | ICD-10-CM | POA: Diagnosis not present

## 2019-09-22 ENCOUNTER — Encounter: Payer: Self-pay | Admitting: Family Medicine

## 2019-09-22 ENCOUNTER — Ambulatory Visit (INDEPENDENT_AMBULATORY_CARE_PROVIDER_SITE_OTHER): Payer: Medicare HMO | Admitting: Family Medicine

## 2019-09-22 ENCOUNTER — Other Ambulatory Visit: Payer: Self-pay

## 2019-09-22 VITALS — BP 133/84 | HR 64 | Wt 143.5 lb

## 2019-09-22 DIAGNOSIS — G8929 Other chronic pain: Secondary | ICD-10-CM

## 2019-09-22 DIAGNOSIS — E7800 Pure hypercholesterolemia, unspecified: Secondary | ICD-10-CM | POA: Diagnosis not present

## 2019-09-22 DIAGNOSIS — M545 Low back pain: Secondary | ICD-10-CM

## 2019-09-22 DIAGNOSIS — R739 Hyperglycemia, unspecified: Secondary | ICD-10-CM

## 2019-09-22 DIAGNOSIS — I10 Essential (primary) hypertension: Secondary | ICD-10-CM

## 2019-09-22 MED ORDER — FAMOTIDINE 20 MG PO TABS: 20.0000 mg | ORAL_TABLET | Freq: Two times a day (BID) | ORAL | 3 refills | Status: DC | PRN

## 2019-09-22 MED ORDER — TIZANIDINE HCL 2 MG PO TABS: 1.0000 mg | ORAL_TABLET | Freq: Every evening | ORAL | 0 refills | Status: DC | PRN

## 2019-09-22 MED ORDER — ROSUVASTATIN CALCIUM 5 MG PO TABS: 5.0000 mg | ORAL_TABLET | ORAL | 1 refills | Status: DC

## 2019-09-22 NOTE — Patient Instructions (Signed)
Omron Blood Pressure cuff, upper arm, want BP 100-140/60-90 Pulse oximeter, want oxygen in 90s  Weekly vitals  Take Multivitamin with minerals, selenium Vitamin D 1000-2000 IU daily Probiotic with lactobacillus and bifidophilus Asprin EC 81 mg daily Fish oil or krill oil caps daily Melatonin 2-5 mg at bedtime  [email or web address]  The mRNA technology has been in development for 20 years and we already had the Coronavirus family of viruses (which usually just cause the common cold) genetically mapped already which is why we were able to come up with viable vaccine candidates so quickly in stage 1, then stage 2 scientifically took the correct amount of time what we did to speed it up was just build the manufacturing platform at the same time we were running the experiments so if it worked we could produce faster. And stage 3 has now had many months and millions of people immunized and we are seeing the immunity hold for over 9 months now with sign of it dissipating and no significant numbers of adverse reactions.  During every flu season we see 2 anaphylactic reactions for every million shots given and we initially thought we would see 11 per million with the COVID vaccine but now we see only 2-3 with Moderna and 5 or so with [place] so compared to someone is dying every 20 minutes from [place] and more deadly and infectious strains are coming it is definitely best when weighing the risks and benefits to take the shots.  Another pooled analysis of the 5 most utilized vaccines in the world shows that after full immunization so far no one has died from [place].

## 2019-09-24 DIAGNOSIS — G8929 Other chronic pain: Secondary | ICD-10-CM | POA: Insufficient documentation

## 2019-09-24 NOTE — Progress Notes (Signed)
Virtual Visit via Video Note  I connected with Sophia [name] on 09/22/19 at  8:20 AM EDT by a video enabled telemedicine application and verified that I am speaking with the correct person using two identifiers.  Location: Patient: home Provider: home   I discussed the limitations of evaluation and management by telemedicine and the availability of in person appointments. The patient expressed understanding and agreed to proceed. Sophia [name], CMA was able to set the patient up on a visit, video   Subjective:    Patient ID: Sophia [name], female    DOB: 1941-02-15, 79 y.o.   MRN: [id number]  Chief Complaint  Patient presents with  . Follow-up     week follow up    HPI Patient is in today for follow up on chronic medical concerns. She is having significant low back pain and hip pain, no radicular symptoms down the legs. No falls or trauma. No incontinence. She is feeling better than she was a few days ago. She has been applying magnesium butter and stretching and htat has helped. No recent febrile illness or hospitalizations. Denies CP/palp/SOB/HA/congestion/fevers/GI or GU c/o. Taking meds as prescribed  Past Medical History:  Diagnosis Date  . Anxiety and depression   . Arthritis    hands, knees, etc  . Bladder prolapse, female, acquired 09/16/2014  . Bowen's disease   . Diverticulitis   . Diverticulosis   . GERD (gastroesophageal reflux disease)   . H/O hematuria    for several years, work only revealed kidney stone on right side  . History of chicken pox   . History of kidney stones   . Hyperlipidemia   . Hypertension   . Leg cramps, sleep related 04/02/2016  . Myalgia 04/25/2017  . Osteopenia 06/26/2014  . Osteoporosis 06/26/2014  . Plantar wart of [name] foot 10/15/2016  . Right shoulder pain 04/23/2017  . SCC (squamous cell carcinoma)    arms  . Statin intolerance 09/11/2019    Past Surgical History:  Procedure Laterality Date  . CHOLECYSTECTOMY  2005  .  LOOP RECORDER INSERTION N/A 04/15/2018   Procedure: LOOP RECORDER INSERTION;  Surgeon: Sanda Klein, MD;  Location: [facility];  Service: Cardiovascular;  Laterality: N/A;  . SKIN BIOPSY     multiple    Family History  Problem Relation Age of Onset  . Heart disease Mother        aortic stenosis  . Hyperlipidemia Mother   . Hypertension Mother   . Heart attack Mother   . Cancer Mother        BCC, SCC, skin  . Hypertension Father   . Cancer Father        SCC, BCC, skin  . Stroke Brother   . Early death Daughter   . Heart disease Maternal Grandfather        MI  . Tuberculosis Paternal Grandmother   . Cancer Paternal Grandfather        head and neck cancer, chewed tobacco  . Hypertension Daughter   . Heart disease Brother        arrythmia  . Hypertension Brother   . Hyperlipidemia Brother   . Birth defects Maternal Uncle     Social History   Socioeconomic History  . Marital status: Single    Spouse name: Not on file  . Number of children: Not on file  . Years of education: Not on file  . Highest education level: Not on file  Occupational History  .  Not on file  Tobacco Use  . Smoking status: Never Smoker  . Smokeless tobacco: Never Used  Substance and Sexual Activity  . Alcohol use: No    Alcohol/week: 0.0 standard drinks  . Drug use: No  . Sexual activity: Never    Comment: divorced, widowed, lives with elderly parents, is the caregiver, no dietary restrictions.   Other Topics Concern  . Not on file  Social History Narrative  . Not on file   Social Determinants of Health   Financial Resource Strain:   . Difficulty of Paying Living Expenses:   Food Insecurity:   . Worried About Charity fundraiser in the Last Year:   . Arboriculturist in the Last Year:   Transportation Needs:   . Film/video editor (Medical):   [name] Lack of Transportation (Non-Medical):   Physical Activity:   . Days of Exercise per Week:   . Minutes of Exercise per Session:    Stress:   . Feeling of Stress :   Social Connections:   . Frequency of Communication with Friends and Family:   . Frequency of Social Gatherings with Friends and Family:   . Attends Religious Services:   . Active Member of Clubs or Organizations:   . Attends Archivist Meetings:   [name] Marital Status:   Intimate Partner Violence:   . Fear of Current or Ex-Partner:   . Emotionally Abused:   [name] Physically Abused:   . Sexually Abused:     Outpatient Medications Prior to Visit  Medication Sig Dispense Refill  . acetaminophen (TYLENOL) 500 MG tablet Take 1,000 mg by mouth 2 (two) times daily as needed for moderate pain or headache.    [name] apixaban (ELIQUIS) 5 MG TABS tablet Take 1 tablet (5 mg total) by mouth 2 (two) times daily. 180 tablet 1  . Calcium Carb-Cholecalciferol (CALCIUM 600 + D PO) Take 1 tablet by mouth daily.    . carvedilol (COREG) 12.5 MG tablet 12.5 mg po bid and 6.25 mg q noon 225 tablet 1  . estradiol (ESTRACE) 0.1 MG/GM vaginal cream Place 1 Applicatorful vaginally 2 (two) times a week.    . Hypromellose (ARTIFICIAL TEARS OP) Place 1 drop into both eyes 2 (two) times daily.    . Probiotic Product (PROBIOTIC DAILY PO) Take 1 capsule by mouth daily.     . famotidine (PEPCID) 10 MG tablet Take 1 tablet (10 mg total) by mouth at bedtime. 30 tablet 5  . ALPRAZolam (XANAX) 0.25 MG tablet Take 1 tablet (0.25 mg total) by mouth daily as needed for anxiety or sleep. (Patient not taking: Reported on 08/10/2019) 30 tablet 1   No facility-administered medications prior to visit.    Allergies  Allergen Reactions  . Morphine And Related Swelling  . Statins Other (See Comments)    Myalgias, weakness  . Sulfa Antibiotics Swelling and Rash    Review of Systems  Constitutional: Negative for fever and malaise/fatigue.  HENT: Negative for congestion.   Eyes: Negative for blurred vision.  Respiratory: Negative for shortness of breath.   Cardiovascular: Negative for chest  pain, palpitations and leg swelling.  Gastrointestinal: Negative for abdominal pain, blood in stool and nausea.  Genitourinary: Negative for dysuria and frequency.  Musculoskeletal: Positive for back pain, joint pain and myalgias. Negative for falls.  Skin: Negative for rash.  Neurological: Negative for dizziness, loss of consciousness and headaches.  Endo/Heme/Allergies: Negative for environmental allergies.  Psychiatric/Behavioral: Negative for depression. The  patient is not nervous/anxious.        Objective:    Physical Exam Constitutional:      Appearance: Normal appearance. She is not ill-appearing.  HENT:     Head: Normocephalic and atraumatic.     Nose: Nose normal.  Eyes:     General:        Right eye: No discharge.        [name] eye: No discharge.  Pulmonary:     Effort: Pulmonary effort is normal.  Neurological:     Mental Status: She is alert and oriented to person, place, and time.  Psychiatric:        Behavior: Behavior normal.     BP 133/84 (BP Location: [name] Arm, Patient Position: Sitting, Cuff Size: Normal)   Pulse 64   Wt 143 lb 8 oz (65.1 kg)   BMI 27.11 kg/m  Wt Readings from Last 3 Encounters:  09/22/19 143 lb 8 oz (65.1 kg)  08/10/19 143 lb (64.9 kg)  02/07/19 142 lb 9.6 oz (64.7 kg)    Diabetic Foot Exam - Simple   No data filed     Lab Results  Component Value Date   WBC 7.1 09/11/2019   HGB 13.0 09/11/2019   HCT 39.1 09/11/2019   PLT 162.0 09/11/2019   GLUCOSE 95 09/11/2019   CHOL 260 (H) 09/11/2019   TRIG 268.0 (H) 09/11/2019   HDL 46.00 09/11/2019   LDLDIRECT 161.0 09/11/2019   LDLCALC 120 (H) 04/26/2018   ALT 12 09/11/2019   AST 18 09/11/2019   NA 137 09/11/2019   K 4.1 09/11/2019   CL 101 09/11/2019   CREATININE 0.86 09/11/2019   BUN 14 09/11/2019   CO2 27 09/11/2019   TSH 2.35 09/11/2019   HGBA1C 5.7 09/11/2019    Lab Results  Component Value Date   TSH 2.35 09/11/2019   Lab Results  Component Value Date   WBC 7.1  09/11/2019   HGB 13.0 09/11/2019   HCT 39.1 09/11/2019   MCV 94.4 09/11/2019   PLT 162.0 09/11/2019   Lab Results  Component Value Date   NA 137 09/11/2019   K 4.1 09/11/2019   CO2 27 09/11/2019   GLUCOSE 95 09/11/2019   BUN 14 09/11/2019   CREATININE 0.86 09/11/2019   BILITOT 0.6 09/11/2019   ALKPHOS 70 09/11/2019   AST 18 09/11/2019   ALT 12 09/11/2019   PROT 6.5 09/11/2019   ALBUMIN 4.1 09/11/2019   CALCIUM 9.2 09/11/2019   ANIONGAP 10 02/22/2018   GFR 63.93 09/11/2019   Lab Results  Component Value Date   CHOL 260 (H) 09/11/2019   Lab Results  Component Value Date   HDL 46.00 09/11/2019   Lab Results  Component Value Date   LDLCALC 120 (H) 04/26/2018   Lab Results  Component Value Date   TRIG 268.0 (H) 09/11/2019   Lab Results  Component Value Date   CHOLHDL 6 09/11/2019   Lab Results  Component Value Date   HGBA1C 5.7 09/11/2019       Assessment & Plan:   Problem List Items Addressed This Visit    Hypertension   Relevant Medications   rosuvastatin (CRESTOR) 5 MG tablet (Start on 09/25/2019)   Other Relevant Orders   CBC   Comprehensive metabolic panel   TSH   Hyperlipidemia    After long discussion of options. She agrees to Rosuvastatin 5 mg twice a week. She had myalgias in the past on older statins taken daily  but would like to try this before referral to the lipid clinic. Reassess labs in 3 months      Relevant Medications   rosuvastatin (CRESTOR) 5 MG tablet (Start on 09/25/2019)   Other Relevant Orders   Lipid panel   Hyperglycemia    hgba1c acceptable, minimize simple carbs. Increase exercise as tolerated.       Chronic bilateral low back pain without sciatica - Primary    Encouraged moist heat and gentle stretching as tolerated. May try NSAIDs and prescription meds as directed and report if symptoms worsen or seek immediate care. Add Tizanidine prn and xrays ordered. Referred to orthopaedics for further evaluation.       Relevant  Medications   tiZANidine (ZANAFLEX) 2 MG tablet   Other Relevant Orders   DG Hip Unilat W OR W/O Pelvis 2-3 Views Right   Ambulatory referral to Orthopedic Surgery      I have discontinued Katharine Look L. Cervenka's ALPRAZolam and famotidine. I am also having her start on famotidine, rosuvastatin, and tiZANidine. Additionally, I am having her maintain her Probiotic Product (PROBIOTIC DAILY PO), estradiol, Calcium Carb-Cholecalciferol (CALCIUM 600 + D PO), acetaminophen, Hypromellose (ARTIFICIAL TEARS OP), apixaban, and carvedilol.  Meds ordered this encounter  Medications  . famotidine (PEPCID) 20 MG tablet    Sig: Take 1 tablet (20 mg total) by mouth 2 (two) times daily as needed for heartburn or indigestion.    Dispense:  180 tablet    Refill:  3  . rosuvastatin (CRESTOR) 5 MG tablet    Sig: Take 1 tablet (5 mg total) by mouth 2 (two) times a week.    Dispense:  30 tablet    Refill:  1  . tiZANidine (ZANAFLEX) 2 MG tablet    Sig: Take 0.5-2 tablets (1-4 mg total) by mouth at bedtime as needed for muscle spasms.    Dispense:  30 tablet    Refill:  0     I discussed the assessment and treatment plan with the patient. The patient was provided an opportunity to ask questions and all were answered. The patient agreed with the plan and demonstrated an understanding of the instructions.   The patient was advised to call back or seek an in-person evaluation if the symptoms worsen or if the condition fails to improve as anticipated.  I provided 25 minutes of non-face-to-face time during this encounter.   Penni Homans, MD

## 2019-09-24 NOTE — Assessment & Plan Note (Signed)
hgba1c acceptable, minimize simple carbs. Increase exercise as tolerated.  

## 2019-09-24 NOTE — Assessment & Plan Note (Addendum)
Encouraged moist heat and gentle stretching as tolerated. May try NSAIDs and prescription meds as directed and report if symptoms worsen or seek immediate care. Add Tizanidine prn and xrays ordered. Referred to orthopaedics for further evaluation.

## 2019-09-24 NOTE — Assessment & Plan Note (Signed)
After long discussion of options. She agrees to Rosuvastatin 5 mg twice a week. She had myalgias in the past on older statins taken daily but would like to try this before referral to the lipid clinic. Reassess labs in 3 months

## 2019-09-25 ENCOUNTER — Other Ambulatory Visit: Payer: Self-pay

## 2019-09-25 ENCOUNTER — Ambulatory Visit (HOSPITAL_BASED_OUTPATIENT_CLINIC_OR_DEPARTMENT_OTHER)
Admission: RE | Admit: 2019-09-25 | Discharge: 2019-09-25 | Disposition: A | Discharge status: Home / Self Care | Payer: Medicare HMO | Source: Ambulatory Visit | Attending: Family Medicine | Admitting: Family Medicine

## 2019-09-25 DIAGNOSIS — M545 Low back pain: Secondary | ICD-10-CM | POA: Diagnosis not present

## 2019-09-25 DIAGNOSIS — G8929 Other chronic pain: Secondary | ICD-10-CM | POA: Diagnosis not present

## 2019-09-25 DIAGNOSIS — M25551 Pain in right hip: Secondary | ICD-10-CM | POA: Diagnosis not present

## 2019-09-28 ENCOUNTER — Ambulatory Visit (INDEPENDENT_AMBULATORY_CARE_PROVIDER_SITE_OTHER): Payer: Medicare HMO | Admitting: *Deleted

## 2019-09-28 DIAGNOSIS — I480 Paroxysmal atrial fibrillation: Secondary | ICD-10-CM

## 2019-09-28 LAB — CUP PACEART REMOTE DEVICE CHECK
Date Time Interrogation Session: 20210401005426
Implantable Pulse Generator Implant Date: 20191018

## 2019-09-28 NOTE — Progress Notes (Signed)
ILR Remote 

## 2019-09-29 ENCOUNTER — Telehealth: Payer: Self-pay | Admitting: Cardiovascular Disease

## 2019-09-29 MED ORDER — CARVEDILOL 12.5 MG PO TABS: 18.7500 mg | ORAL_TABLET | Freq: Two times a day (BID) | ORAL | 3 refills | Status: DC

## 2019-09-29 NOTE — Telephone Encounter (Signed)
Spoke with patient. Patient concerned because her blood pressure has started to trend upwards. She saw her PCP yesterday who recommended she start taking 6.25mg  of Carvedilol at noon. Patient wants Dr. Loletha Grayer to provide input and also determine if the additional carvedilol should be taken at noon or if she should take it at night with her 12.5mg  of carvedilol because that is when her blood pressure tends to increase.   3/29: 129/80, 60.   133/87, 66 3/30: 127/81     143/87 3/31: 137/87, 65.   145/86, 70 4/1: 135/87, 63    172/102, 71 (headache took PM meds) 137/80, 68 on recheck 4/2: AM 143/89, 64 headache. Noon 147/87, 63 feels fine currently.   Patient instructed to call cardiology if BP rises again, educated on after hours availability. Will route to MD for review.

## 2019-09-29 NOTE — Telephone Encounter (Signed)
Personally, I think it would be fine to increase the carvedilol to 18.75 (one and a half of the 12.5 mg tabs) with both the morning dose and the evening dose). If she would like to start gradually and first increase the evening dose only, that is fine. Taking the med three times daily will be difficult to remember and may lead to missed doses, in my experience.

## 2019-09-29 NOTE — Telephone Encounter (Signed)
Spoke with patient and advised her of medication recommendations from Dr. Sallyanne Kuster. Patient agreeable to increase to 18.75mg  of Carvedilol twice a day. She will stay by increasing evening dose and then increase daytime dose as well. New prescription sent to preferred pharmacy.

## 2019-09-29 NOTE — Telephone Encounter (Signed)
New Message  Patient called and mentionned that she really needs a nurse to give her a phone call about her heart and bp  Please call to discuss

## 2019-10-09 ENCOUNTER — Telehealth: Payer: Self-pay | Admitting: Cardiovascular Disease

## 2019-10-09 MED ORDER — CARVEDILOL 12.5 MG PO TABS: 18.7500 mg | ORAL_TABLET | Freq: Two times a day (BID) | ORAL | 3 refills | Status: DC

## 2019-10-09 NOTE — Telephone Encounter (Signed)
Patient states the pharmacy told her they are waiting on clarification on the script that was recently sent to [facility] on [address] for carvedilol (COREG) 12.5 MG tablet

## 2019-10-09 NOTE — Telephone Encounter (Signed)
Sent new script to pharmacy clarifying instructions.

## 2019-10-17 ENCOUNTER — Other Ambulatory Visit: Payer: Self-pay

## 2019-10-17 ENCOUNTER — Encounter: Payer: Self-pay | Admitting: Cardiovascular Disease

## 2019-10-17 ENCOUNTER — Ambulatory Visit: Payer: Medicare HMO | Admitting: Cardiovascular Disease

## 2019-10-17 VITALS — BP 153/91 | HR 73 | Temp 96.3°F | Resp 18 | Ht 61.0 in | Wt 144.8 lb

## 2019-10-17 DIAGNOSIS — I480 Paroxysmal atrial fibrillation: Secondary | ICD-10-CM | POA: Diagnosis not present

## 2019-10-17 DIAGNOSIS — I4729 Other ventricular tachycardia: Secondary | ICD-10-CM

## 2019-10-17 DIAGNOSIS — E7800 Pure hypercholesterolemia, unspecified: Secondary | ICD-10-CM | POA: Diagnosis not present

## 2019-10-17 DIAGNOSIS — Z7901 Long term (current) use of anticoagulants: Secondary | ICD-10-CM | POA: Diagnosis not present

## 2019-10-17 DIAGNOSIS — Z4509 Encounter for adjustment and management of other cardiac device: Secondary | ICD-10-CM

## 2019-10-17 DIAGNOSIS — I10 Essential (primary) hypertension: Secondary | ICD-10-CM | POA: Diagnosis not present

## 2019-10-17 DIAGNOSIS — I472 Ventricular tachycardia: Secondary | ICD-10-CM | POA: Diagnosis not present

## 2019-10-17 MED ORDER — CARVEDILOL 25 MG PO TABS: 25.0000 mg | ORAL_TABLET | Freq: Two times a day (BID) | ORAL | 1 refills | Status: DC

## 2019-10-17 NOTE — Progress Notes (Signed)
Cardiology Office  Note     Evaluation Performed:  Follow-up visit  Date:  10/20/2019   ID:  Sophia [name], DOB [birth year], MRN [id number]  PCP:  Mosie Lukes, MD  Cardiologist:  Quay Burow, MD  Electrophysiologist:  None   Chief Complaint:  AFib f/u  History of Present Illness:    Sophia [name] is a 79 y.o. female with a history of a single episode of near syncope for which she received a loop recorder (work-up had shown evidence of nonsustained VT and atrial tachycardia prior to her loop recorder implantation).  The loop recorder documented a 3-hour episode of paroxysmal atrial fibrillation with rapid ventricular response, associated with nausea and a sensation of weakness reminiscent of her near syncope.  She has not had recurrent syncope since then.  Recently she has had issues with elevated blood pressure.  We have been gradually increasing her beta-blocker.  She has had occasional chest pain occurring at rest, relatively mild, hard to describe, lasting for an hour at a time and coming and going in a random fashion.  It never occurs with physical activity.  She denies dyspnea at rest or with activity and has had not orthopnea, PND or edema.  She denies palpitations.  Lipid profile checked last month shows markedly elevated total cholesterol and LDL cholesterol.  She has not tolerated statins in higher doses, but takes rosuvastatin twice a week.  Her loop recorder has not documented any further meaningful arrhythmia.  She has a history of hyperlipidemia but has not tolerated statins.  She has a family history of premature onset coronary disease in a first-degree female relatives.  Her recent echocardiogram does not show significant structural heart disease.  LVEF is normal. She had a low risk nuclear myocardial perfusion study.  She does not have carotid stenosis on ultrasound studies.  She has had previous problems with chest pain, but none recently and the work-up so  far has been negative.  She has frequent palpitations and complains of a rushing sensation in her ear with each heartbeat.  This is positional.  She is a Restaurant manager, fast food.  She lives with her daughter, son-in-law and grandchild.    The patient does not have symptoms concerning for COVID-19 infection (fever, chills, cough, or new shortness of breath).    Past Medical History:  Diagnosis Date  . Anxiety and depression   . Arthritis    hands, knees, etc  . Bladder prolapse, female, acquired 09/16/2014  . Bowen's disease   . Diverticulitis   . Diverticulosis   . GERD (gastroesophageal reflux disease)   . H/O hematuria    for several years, work only revealed kidney stone on right side  . History of chicken pox   . History of kidney stones   . Hyperlipidemia   . Hypertension   . Leg cramps, sleep related 04/02/2016  . Myalgia 04/25/2017  . Osteopenia 06/26/2014  . Osteoporosis 06/26/2014  . Plantar wart of left foot 10/15/2016  . Right shoulder pain 04/23/2017  . SCC (squamous cell carcinoma)    arms  . Statin intolerance 09/11/2019   Past Surgical History:  Procedure Laterality Date  . CHOLECYSTECTOMY  2005  . LOOP RECORDER INSERTION N/A 04/15/2018   Procedure: LOOP RECORDER INSERTION;  Surgeon: Sanda Klein, MD;  Location: [facility];  Service: Cardiovascular;  Laterality: N/A;  . SKIN BIOPSY     multiple     Current Meds  Medication Sig  . acetaminophen (  TYLENOL) 500 MG tablet Take 1,000 mg by mouth 2 (two) times daily as needed for moderate pain or headache.  [name] apixaban (ELIQUIS) 5 MG TABS tablet Take 1 tablet (5 mg total) by mouth 2 (two) times daily.  . Calcium Carb-Cholecalciferol (CALCIUM 600 + D PO) Take 1 tablet by mouth daily.  . carvedilol (COREG) 25 MG tablet Take 1 tablet (25 mg total) by mouth 2 (two) times daily with a meal.  . estradiol (ESTRACE) 0.1 MG/GM vaginal cream Place 1 Applicatorful vaginally 2 (two) times a week.  . famotidine (PEPCID)  20 MG tablet Take 1 tablet (20 mg total) by mouth 2 (two) times daily as needed for heartburn or indigestion.  . Hypromellose (ARTIFICIAL TEARS OP) Place 1 drop into both eyes 2 (two) times daily.  . Probiotic Product (PROBIOTIC DAILY PO) Take 1 capsule by mouth daily.   . rosuvastatin (CRESTOR) 5 MG tablet Take 1 tablet (5 mg total) by mouth 2 (two) times a week.  [name] tiZANidine (ZANAFLEX) 2 MG tablet Take 0.5-2 tablets (1-4 mg total) by mouth at bedtime as needed for muscle spasms.  . [DISCONTINUED] carvedilol (COREG) 12.5 MG tablet Take 1.5 tablets (18.75 mg total) by mouth 2 (two) times daily with a meal. 18.75 mg po bid     Allergies:   Morphine and related, Statins, and Sulfa antibiotics   Social History   Tobacco Use  . Smoking status: Never Smoker  . Smokeless tobacco: Never Used  Substance Use Topics  . Alcohol use: No    Alcohol/week: 0.0 standard drinks  . Drug use: No     Family Hx: The patient's family history includes Birth defects in her maternal uncle; Cancer in her father, mother, and paternal grandfather; Early death in her daughter; Heart attack in her mother; Heart disease in her brother, maternal grandfather, and mother; Hyperlipidemia in her brother and mother; Hypertension in her brother, daughter, father, and mother; Stroke in her brother; Tuberculosis in her paternal grandmother.  ROS:   Please see the history of present illness.     All other systems reviewed and are negative.   Prior CV studies:   The following studies were reviewed today: Loop recorder downloads  Labs/Other Tests and Data Reviewed:    EKG: ECG ordered today shows sinus rhythm, nonspecific ST scooping and is otherwise a normal tracing. Recent Labs: 09/11/2019: ALT 12; BUN 14; Creatinine, Ser 0.86; Hemoglobin 13.0; Platelets 162.0; Potassium 4.1; Sodium 137; TSH 2.35   Recent Lipid Panel Lab Results  Component Value Date/Time   CHOL 260 (H) 09/11/2019 09:25 AM   TRIG 268.0 (H)  09/11/2019 09:25 AM   HDL 46.00 09/11/2019 09:25 AM   CHOLHDL 6 09/11/2019 09:25 AM   LDLCALC 120 (H) 04/26/2018 11:56 AM   LDLDIRECT 161.0 09/11/2019 09:25 AM     Wt Readings from Last 3 Encounters:  10/17/19 144 lb 12.8 oz (65.7 kg)  09/22/19 143 lb 8 oz (65.1 kg)  08/10/19 143 lb (64.9 kg)     Objective:    Vital Signs:  BP (!) 153/91   Pulse 73   Temp (!) 96.3 F (35.7 C)   Resp 18   Ht 5\' 1"  (1.549 m)   Wt 144 lb 12.8 oz (65.7 kg)   SpO2 97%   BMI 27.36 kg/m     General: Alert, oriented x3, no distress, overweight Head: no evidence of trauma, PERRL, EOMI, no exophtalmos or lid lag, no myxedema, no xanthelasma; normal ears, nose and oropharynx  Neck: normal jugular venous pulsations and no hepatojugular reflux; brisk carotid pulses without delay and no carotid bruits Chest: clear to auscultation, no signs of consolidation by percussion or palpation, normal fremitus, symmetrical and full respiratory excursions Cardiovascular: normal position and quality of the apical impulse, regular rhythm, normal first and second heart sounds, no murmurs, rubs or gallops Abdomen: no tenderness or distention, no masses by palpation, no abnormal pulsatility or arterial bruits, normal bowel sounds, no hepatosplenomegaly Extremities: no clubbing, cyanosis or edema; 2+ radial, ulnar and brachial pulses bilaterally; 2+ right femoral, posterior tibial and dorsalis pedis pulses; 2+ left femoral, posterior tibial and dorsalis pedis pulses; no subclavian or femoral bruits Neurological: grossly nonfocal Psych: Normal mood and affect   ASSESSMENT & PLAN:    1. AFib: Presented with a sensation of near syncope.  No recent recurrence clinically or documented by the loop recorder.  She is on appropriate anticoagulation. CHADSVasc 4 (age, gender, HTN). 2. Eliquis: Well-tolerated, no bleeding problems.  She is a Restaurant manager, fast food. 3. NSVT: Previously detected event monitoring: None seen on loop recorder.  4. ILR: Normal device function, continue monthly downloads. 5. HTN: Increase carvedilol to 25 mg twice daily. 6. HLP: She does not have known CAD or PAD but markedly elevated LDL.  Try to add coenzyme Q10 and increase the rosuvastatin to 3 days a week.   COVID-19 Education: The signs and symptoms of COVID-19 were discussed with the patient and how to seek care for testing (follow up with PCP or arrange E-visit).  The importance of social distancing was discussed today.  Time:   Today, I have spent 22 minutes with the patient with telehealth technology discussing the above problems.     Medication Adjustments/Labs and Tests Ordered: Current medicines are reviewed at length with the patient today.  Concerns regarding medicines are outlined above.   Tests Ordered: Orders Placed This Encounter  Procedures  . EKG 12-Lead    Medication Changes: Meds ordered this encounter  Medications  . carvedilol (COREG) 25 MG tablet    Sig: Take 1 tablet (25 mg total) by mouth 2 (two) times daily with a meal.    Dispense:  180 tablet    Refill:  1    Disposition:  Follow up 12 months  Signed, Sanda Klein, MD  10/20/2019 2:58 PM    [place]

## 2019-10-17 NOTE — Patient Instructions (Signed)
Medication Instructions:  INCREASE the Carvedilol to 25 mg twice daily  *If you need a refill on your cardiac medications before your next appointment, please call your pharmacy*   Lab Work: None ordered If you have labs (blood work) drawn today and your tests are completely normal, you will receive your results only by: [name] MyChart Message (if you have MyChart) OR . A paper copy in the mail If you have any lab test that is abnormal or we need to change your treatment, we will call you to review the results.   Testing/Procedures: None ordered   Follow-Up: At [facility], you and your health needs are our priority.  As part of our continuing mission to provide you with exceptional heart care, we have created designated Provider Care Teams.  These Care Teams include your primary Cardiologist (physician) and Advanced Practice Providers (APPs -  Physician Assistants and Nurse Practitioners) who all work together to provide you with the care you need, when you need it.  We recommend signing up for the patient portal called "MyChart".  Sign up information is provided on this After Visit Summary.  MyChart is used to connect with patients for Virtual Visits (Telemedicine).  Patients are able to view lab/test results, encounter notes, upcoming appointments, etc.  Non-urgent messages can be sent to your provider as well.   To learn more about what you can do with MyChart, go to [email or web address].    Your next appointment:   6 month(s)  The format for your next appointment:   In Person  Provider:   You may see Sanda Klein, MD or one of the following Advanced Practice Providers on your designated Care Team:    Almyra Deforest, PA-C  Fabian Sharp, [place] or   Roby Lofts, [place]    Other Instructions Dr. Sallyanne Kuster would like you to check your blood pressure daily for the next 2 weeks.  Keep a journal of these daily blood pressure and heart rate readings and call our office or send a  message through [place] with the results. Thank you!

## 2019-10-20 DIAGNOSIS — Z7901 Long term (current) use of anticoagulants: Secondary | ICD-10-CM | POA: Insufficient documentation

## 2019-10-20 DIAGNOSIS — Z4509 Encounter for adjustment and management of other cardiac device: Secondary | ICD-10-CM | POA: Insufficient documentation

## 2019-10-29 LAB — CUP PACEART REMOTE DEVICE CHECK
Date Time Interrogation Session: 20210502010418
Implantable Pulse Generator Implant Date: 20191018

## 2019-10-30 ENCOUNTER — Ambulatory Visit (INDEPENDENT_AMBULATORY_CARE_PROVIDER_SITE_OTHER): Payer: Medicare HMO | Admitting: *Deleted

## 2019-10-30 DIAGNOSIS — R55 Syncope and collapse: Secondary | ICD-10-CM

## 2019-10-30 DIAGNOSIS — I480 Paroxysmal atrial fibrillation: Secondary | ICD-10-CM

## 2019-10-31 NOTE — Progress Notes (Signed)
Carelink Summary Report / Loop Recorder 

## 2019-11-02 ENCOUNTER — Telehealth: Payer: Self-pay | Admitting: Cardiovascular Disease

## 2019-11-02 NOTE — Telephone Encounter (Signed)
New message  Patient is calling in to report blood pressure readings for the late 2 weeks.   10/17/19- 123/81 HR 59 (am) 137/87 HR 65 (pm) 10/18/19- 124/82 HR 61 (am) 132/84 HR 63 (pm) 10/19/19- 136/83 HR 60 (am) 142/88 HR 64 (pm) 10/20/19- 138/86 HR 61 (am) 104/68 HR 66 (pm) 10/21/19- 122/77 HR 61 (am) 133/81 HR 67 (pm) 10/22/19- 132/83 HR 60 (am) 10/23/19- 128/83 HR 59 (am) 143/87 HR 64 (pm) 10/24/19- 127/79 HR 57 (am) 130/86 HR 62 (pm) 10/25/19- 125/79 HR 59 (am) 122/80 HR 63 (pm) 10/26/19- 127/78 HR 60 (am) 142/88 HR 62 (pm) 10/27/19- 119/80 HR 58 (am) 132/84 HR 63 (pm) 10/28/19- 122/79 HR 60 (am)  10/29/19- 119/76 HR 57 (am) 122/85 HR 63 (pm) 10/30/19- 120/76 HR 59 (am) 120/80 HR 64 (pm) 10/31/19- 125/78 HR 61 (am) 115/75 HR 66 (pm) 11/01/19- 129/84 HR 63 (am) 135/87 HR 61 (pm) 11/02/19- 135/102 HR 112 (12:30 am) 137/96 HR 92 (7:30 am) 138/100 HR 90 (9:30 am)  130/95 HR 86 (10:30am)   Patient states that she is having issues with her blood pressure and heart rate, no symptoms currently, but states that her heart started beating weird/flutttering and that woke her up out of her sleep at 12:30 am. Please give patient a call back to assist.

## 2019-11-02 NOTE — Telephone Encounter (Signed)
Patient reported Dr. Loletha Grayer had requested 2 weeks of BP readings after coreg was increased at last visit She reports when she has an AFib episodes in the past, that Dr. Loletha Grayer has advised she take an additional half tab of coreg She was hesitant to do this as she is now on coreg 25mg  BID She reports an episode of heart fluttering that woke her up around 12:30am this morning - HR 112 and is still in the 90s Her usual HR is the 60s She had a device transmission on 5/2 and there was no new AFib episodes noted She has does not report any other complaints other than "beating weird/fluttering"  Advised will send message to Dr. Loletha Grayer and his nurse and DOD since primary MD is on vacation and follow up with her

## 2019-11-02 NOTE — Telephone Encounter (Signed)
Patient called with MD advice. She verbalized understanding. Med list updated

## 2019-11-02 NOTE — Telephone Encounter (Signed)
As her coreg has been increased, I would recommend that she not take an additional 1/2 tab coreg if her heart rate remains less than 100 at rest. If her heart rate is consistently more than 100 at rest and her blood pressure is also [id number] systolic, she can take an additional 1/2 tab carvedilol at that time. Thanks.

## 2019-11-03 ENCOUNTER — Telehealth: Payer: Self-pay | Admitting: *Deleted

## 2019-11-03 NOTE — Telephone Encounter (Signed)
LINQ alert received for AF episode ongoing since 11/02/19 at 00:47. V rates overall fairly controlled. Pt called in to NL triage 5/6 reporting elevated HRs and fluttering sensation, instructions given for additional 0.5 tab carvedilol PRN per Dr. Harrell Gave. On Eliquis.  Spoke with patient. Pt reports that yesterday she felt poorly, but today she feels much better. HR slightly higher than usual at rest, but no other symptoms. Pt feels she is still in AF but tolerating it well today. She wishes to monitor her symptoms over the weekend and see if she converts to SR on her own. Advised will plan to have Device Clinic RN follow-up with pt again on Monday, 5/10. ED precautions reviewed for new or worsening symptoms over the weekend. Pt verbalizes understanding and agreement with plan.  Routed to Dr. Sallyanne Kuster as Juluis Rainier.

## 2019-11-06 ENCOUNTER — Telehealth: Payer: Self-pay

## 2019-11-06 NOTE — Telephone Encounter (Signed)
Called patient to reassess. Patient reports of feeling fatigued and about the same as last week. Patient did not take any extra doses of Coreg d/t rate staying below 100 bpm. Patient advised I will forward this information to Dr. Sallyanne Kuster for any further recommendations. Advised patient to call DC if she has any questions or concerns. Verbalizes understanding.   Presenting rhythm 11/06/19

## 2019-11-06 NOTE — Telephone Encounter (Signed)
Error

## 2019-11-06 NOTE — Telephone Encounter (Signed)
So strange. When I sign in to [facility], I cannot see the download from today, only the May 7. Does it look like uninterrupted AF over last 4 days?

## 2019-11-07 ENCOUNTER — Telehealth: Payer: Self-pay

## 2019-11-07 NOTE — Telephone Encounter (Signed)
Called patient Dr.Berry advised needs to schedule appointment with him this week for persistent afib.Advised to keep appointment already scheduled with Dr.Berry 11/08/19 at 2:30 pm.

## 2019-11-07 NOTE — Telephone Encounter (Signed)
She's been in persistent AFib since Friday, symptomatic. Trying to get her in to see you to discuss cardioversion.

## 2019-11-08 ENCOUNTER — Encounter: Payer: Self-pay | Admitting: Cardiovascular Disease

## 2019-11-08 ENCOUNTER — Ambulatory Visit: Payer: Medicare HMO | Admitting: Cardiovascular Disease

## 2019-11-08 ENCOUNTER — Other Ambulatory Visit: Payer: Self-pay

## 2019-11-08 VITALS — BP 148/96 | HR 106 | Ht 61.0 in | Wt 143.2 lb

## 2019-11-08 DIAGNOSIS — I480 Paroxysmal atrial fibrillation: Secondary | ICD-10-CM

## 2019-11-08 DIAGNOSIS — E782 Mixed hyperlipidemia: Secondary | ICD-10-CM

## 2019-11-08 DIAGNOSIS — I10 Essential (primary) hypertension: Secondary | ICD-10-CM | POA: Diagnosis not present

## 2019-11-08 MED ORDER — DILTIAZEM HCL ER COATED BEADS 120 MG PO CP24: 120.0000 mg | ORAL_CAPSULE | Freq: Every day | ORAL | 3 refills | Status: DC

## 2019-11-08 NOTE — Patient Instructions (Addendum)
Medication Instructions:  START DILTIAZEM CD 120 MG ONCE DAILY  *If you need a refill on your cardiac medications before your next appointment, please call your pharmacy*   Lab Work: If you have labs (blood work) drawn today and your tests are completely normal, you will receive your results only by: [name] MyChart Message (if you have MyChart) OR . A paper copy in the mail If you have any lab test that is abnormal or we need to change your treatment, we will call you to review the results.  Follow-Up: At [facility], you and your health needs are our priority.  As part of our continuing mission to provide you with exceptional heart care, we have created designated Provider Care Teams.  These Care Teams include your primary Cardiologist (physician) and Advanced Practice Providers (APPs -  Physician Assistants and Nurse Practitioners) who all work together to provide you with the care you need, when you need it.  We recommend signing up for the patient portal called "MyChart".  Sign up information is provided on this After Visit Summary.  MyChart is used to connect with patients for Virtual Visits (Telemedicine).  Patients are able to view lab/test results, encounter notes, upcoming appointments, etc.  Non-urgent messages can be sent to your provider as well.   To learn more about what you can do with MyChart, go to [email or web address].    Your next appointment:   1 month(s)  The format for your next appointment:   In Person  Provider:   Quay Burow, MD   [place] [address]Monday 11-13-19 @ 10 AM  REFERRAL TO DR [place] AT [facility] OFFICE

## 2019-11-08 NOTE — Assessment & Plan Note (Signed)
History of PAF status post loop recorder implantation by Dr. Sallyanne Kuster.  She has had several episodes prior to this 1 lasting up to 19 hours however she went into A. fib approxi-1 week ago with a relatively rapid ventricular response.  She is symptomatic with some atypical chest pain and fatigue.  She has been on Eliquis twice daily for approximate 1 year without interruption.  I am going to add Cardizem CD 120 mg a day for rate control.  Her heart rate is 106 today.  She needs outpatient DC cardioversion next several days over the schedule is full.  I am going to have her see the A. fib clinic this week to help facilitate.

## 2019-11-08 NOTE — Assessment & Plan Note (Signed)
History of hyperlipidemia not on statin therapy with lipid profile performed 09/11/2019 revealing a total cholesterol of 260, LDL of 161 HDL 46.  She is statin intolerant.  We will refer her to Dr. Debara Pickett for further evaluation.

## 2019-11-08 NOTE — Progress Notes (Signed)
Thanks

## 2019-11-08 NOTE — Assessment & Plan Note (Addendum)
History of essential hypertension with blood pressure measured today in the office of 148/96.  She is on carvedilol 25 mg p.o. twice daily.  Her blood pressure has been more elevated recently since she has been in A. fib.  Going to begin her on Cardizem CD 120 mg a day.

## 2019-11-08 NOTE — Progress Notes (Signed)
11/08/2019 Sophia [name]   1940-11-05  [id number]  Primary Physician Sophia Lukes, MD Primary Cardiologist: Sophia Harp MD Sophia [name], [place], [place]  HPI:  Sophia [name] is a 79 y.o.  mild to moderately overweight divorced Caucasian female mother of 3 children, grandmother of 5 grandchildren who is accompanied by one of her daughters, Sophia [name] (Sophia [name] is a patient of mine). She was referred by the ER for evaluation of chest pain. Her primary care provider is Dr. Gwyneth [name] .  I last saw her in the office 03/16/2018.  Risk factors include hyperlipidemia intolerant to statin therapy and family history with mother who had a myocardial infarction at age 14 as well as extensive vascular disease. 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 She is never had a heart attack or stroke. She has had palpitations evaluated by cardiologist in [place] 7 years ago which time a work-up included stress testing and event monitoring. She was placed on carvedilol at that time. She was seen in the emergency room at [facility] on 02/22/2018 with chest pain and palpitations. Pain began in the early morning hours and got worse. She thought it was indigestion. She did feel increasing palpitations as well as shortness of breath. She says she can also hear her "heartbeat" in her left ear. Her troponins were negative and her EKG showed no acute changes at that time.   I did obtain a 2D echo which was essentially normal with aortic sclerosis and very mild pulmonary hypertension, and a Myoview stress test which was entirely normal as were carotid Doppler studies.  An event monitor was performed which showed PACs, PVCs, short atrial runs and a short run of nonsustained ventricular tachycardia.  She has had some presyncopal episodes in the past which could be arrhythmogenic.  She had a loop recorder implanted by Dr. Sallyanne [name].  She did have runs of atrial  fibrillation and was begun on Eliquis approxi-1 year ago which she is taking religiously.  She went to A. fib approxi-1 week ago with RVR.  Dr. Sallyanne [name] increased her carvedilol from 18.75 to 25 mg p.o. twice daily although she still is exhibiting a slightly higher ventricular response and is symptomatic with some atypical chest pain and fatigue.   Current Meds  Medication Sig  . acetaminophen (TYLENOL) 500 MG tablet Take 1,000 mg by mouth 2 (two) times daily as needed for moderate pain or headache.  Sophia [name] apixaban (ELIQUIS) 5 MG TABS tablet Take 1 tablet (5 mg total) by mouth 2 (two) times daily.  . Calcium Carb-Cholecalciferol (CALCIUM 600 + D PO) Take 1 tablet by mouth daily.  . carvedilol (COREG) 25 MG tablet Take 1 tablet (25 mg total) by mouth 2 (two) times daily with a meal. (Patient taking differently: Take 25 mg by mouth 2 (two) times daily with a meal. Can take additional half-tab if HR >100 and SBP >120.)  . estradiol (ESTRACE) 0.1 MG/GM vaginal cream Place 1 Applicatorful vaginally 2 (two) times a week.  . famotidine (PEPCID) 20 MG tablet Take 1 tablet (20 mg total) by mouth 2 (two) times daily as needed for heartburn or indigestion.  . Hypromellose (ARTIFICIAL TEARS OP) Place 1 drop into both eyes 2 (two) times daily.  . Probiotic Product (PROBIOTIC DAILY PO) Take 1 capsule by mouth daily.   Sophia [name] tiZANidine (ZANAFLEX) 2 MG tablet Take 0.5-2 tablets (1-4 mg total) by mouth at bedtime as needed  for muscle spasms.     Allergies  Allergen Reactions  . Morphine And Related Swelling  . Statins Other (See Comments)    Myalgias, weakness  . Sulfa Antibiotics Swelling and Rash    Social History   Socioeconomic History  . Marital status: Single    Spouse name: Not on file  . Number of children: Not on file  . Years of education: Not on file  . Highest education level: Not on file  Occupational History  . Not on file  Tobacco Use  . Smoking status: Never Smoker  . Smokeless tobacco:  Never Used  Substance and Sexual Activity  . Alcohol use: No    Alcohol/week: 0.0 standard drinks  . Drug use: No  . Sexual activity: Never    Comment: divorced, widowed, lives with elderly parents, is the caregiver, no dietary restrictions.   Other Topics Concern  . Not on file  Social History Narrative  . Not on file   Social Determinants of Health   Financial Resource Strain:   . Difficulty of Paying Living Expenses:   Food Insecurity:   . Worried About Charity fundraiser in the Last Year:   . Arboriculturist in the Last Year:   Transportation Needs:   . Film/video editor (Medical):   Sophia [name] Lack of Transportation (Non-Medical):   Physical Activity:   . Days of Exercise per Week:   . Minutes of Exercise per Session:   Stress:   . Feeling of Stress :   Social Connections:   . Frequency of Communication with Friends and Family:   . Frequency of Social Gatherings with Friends and Family:   . Attends Religious Services:   . Active Member of Clubs or Organizations:   . Attends Archivist Meetings:   Sophia [name] Marital Status:   Intimate Partner Violence:   . Fear of Current or Ex-Partner:   . Emotionally Abused:   Sophia [name] Physically Abused:   . Sexually Abused:      Review of Systems: General: negative for chills, fever, night sweats or weight changes.  Cardiovascular: negative for chest pain, dyspnea on exertion, edema, orthopnea, palpitations, paroxysmal nocturnal dyspnea or shortness of breath Dermatological: negative for rash Respiratory: negative for cough or wheezing Urologic: negative for hematuria Abdominal: negative for nausea, vomiting, diarrhea, bright red blood per rectum, melena, or hematemesis Neurologic: negative for visual changes, syncope, or dizziness All other systems reviewed and are otherwise negative except as noted above.    Blood pressure (!) 148/96, pulse (!) 106, height 5\' 1"  (1.549 m), weight 143 lb 3.2 oz (65 kg).  General appearance: alert  and no distress Neck: no adenopathy, no carotid bruit, no JVD, supple, symmetrical, trachea midline and thyroid not enlarged, symmetric, no tenderness/mass/nodules Lungs: clear to auscultation bilaterally Heart: irregularly irregular rhythm Extremities: extremities normal, atraumatic, no cyanosis or edema Pulses: 2+ and symmetric Skin: Skin color, texture, turgor normal. No rashes or lesions Neurologic: Alert and oriented X 3, normal strength and tone. Normal symmetric reflexes. Normal coordination and gait  EKG atrial fibrillation with ventricular response of 106 and nonspecific ST and T wave changes.  I personally reviewed this EKG.  ASSESSMENT AND PLAN:   Hypertension History of essential hypertension with blood pressure measured today in the office of 148/96.  She is on carvedilol 25 mg p.o. twice daily.  Her blood pressure has been more elevated recently since she has been in A. fib.  Going to begin her on  Cardizem CD 120 mg a day.    Hyperlipidemia History of hyperlipidemia not on statin therapy with lipid profile performed 09/11/2019 revealing a total cholesterol of 260, LDL of 161 HDL 46.  She is statin intolerant.  We will refer her to Dr. Debara Pickett for further evaluation.  Paroxysmal atrial fibrillation (HCC) History of PAF status post loop recorder implantation by Dr. Sallyanne [name].  She has had several episodes prior to this 1 lasting up to 19 hours however she went into A. fib approxi-1 week ago with a relatively rapid ventricular response.  She is symptomatic with some atypical chest pain and fatigue.  She has been on Eliquis twice daily for approximate 1 year without interruption.  I am going to add Cardizem CD 120 mg a day for rate control.  Her heart rate is 106 today.  She needs outpatient DC cardioversion next several days over the schedule is full.  I am going to have her see the A. fib clinic this week to help facilitate.      Sophia Harp MD FACP,FACC,FAHA,  [facility] 11/08/2019 3:01 PM

## 2019-11-12 NOTE — Progress Notes (Signed)
Primary Care Physician: Mosie Lukes, MD Primary Cardiologist: Dr Gwenlyn Found Primary Electrophysiologist: none Referring Physician: Dr Rushie Goltz is a 79 y.o. female with a history of HTN, HLD, and persistent atrial fibrillation who presents for consultation in the [facility].  The patient was initially diagnosed with atrial fibrillation 05/2018 on her ILR which was placed for near syncope. She was started on Eliquis for a CHADS2VASC score of 4. Patient had done well with no further episodes until 11/03/19 when she started having symptoms of chest discomfort and fatigue. ILR showed persistent afib. Patient is back in SR today with symptom improvement over the last couple days. Carelink reviewed which shows she spontaneously converted to SR on 11/10/19. There were no specific triggers that she could identify. She denies snoring or alcohol use.  Today, she denies symptoms of palpitations, shortness of breath, orthopnea, PND, lower extremity edema, dizziness, presyncope, syncope, snoring, daytime somnolence, bleeding, or neurologic sequela. The patient is tolerating medications without difficulties and is otherwise without complaint today.    Atrial Fibrillation Risk Factors:  she does not have symptoms or diagnosis of sleep apnea. she does not have a history of rheumatic fever. she does not have a history of alcohol use. The patient does not have a history of early familial atrial fibrillation or other arrhythmias.  she has a BMI of Body mass index is 27.36 kg/m.[name] Filed Weights   11/13/19 1003  Weight: 65.7 kg    Family History  Problem Relation Age of Onset  . Heart disease Mother        aortic stenosis  . Hyperlipidemia Mother   . Hypertension Mother   . Heart attack Mother   . Cancer Mother        BCC, SCC, skin  . Hypertension Father   . Cancer Father        SCC, BCC, skin  . Stroke Brother   . Early death Daughter   . Heart disease  Maternal Grandfather        MI  . Tuberculosis Paternal Grandmother   . Cancer Paternal Grandfather        head and neck cancer, chewed tobacco  . Hypertension Daughter   . Heart disease Brother        arrythmia  . Hypertension Brother   . Hyperlipidemia Brother   . Birth defects Maternal Uncle      Atrial Fibrillation Management history:  Previous antiarrhythmic drugs: none Previous cardioversions: none Previous ablations: none CHADS2VASC score: 4 Anticoagulation history: Eliquis   Past Medical History:  Diagnosis Date  . Anxiety and depression   . Arthritis    hands, knees, etc  . Bladder prolapse, female, acquired 09/16/2014  . Bowen's disease   . Diverticulitis   . Diverticulosis   . GERD (gastroesophageal reflux disease)   . H/O hematuria    for several years, work only revealed kidney stone on right side  . History of chicken pox   . History of kidney stones   . Hyperlipidemia   . Hypertension   . Leg cramps, sleep related 04/02/2016  . Myalgia 04/25/2017  . Osteopenia 06/26/2014  . Osteoporosis 06/26/2014  . Plantar wart of left foot 10/15/2016  . Right shoulder pain 04/23/2017  . SCC (squamous cell carcinoma)    arms  . Statin intolerance 09/11/2019   Past Surgical History:  Procedure Laterality Date  . CHOLECYSTECTOMY  2005  . LOOP RECORDER INSERTION N/A 04/15/2018  Procedure: LOOP RECORDER INSERTION;  Surgeon: Sanda Klein, MD;  Location: [facility];  Service: Cardiovascular;  Laterality: N/A;  . SKIN BIOPSY     multiple    Current Outpatient Medications  Medication Sig Dispense Refill  . acetaminophen (TYLENOL) 500 MG tablet Take 1,000 mg by mouth 2 (two) times daily as needed for moderate pain or headache.    [name] apixaban (ELIQUIS) 5 MG TABS tablet Take 1 tablet (5 mg total) by mouth 2 (two) times daily. 180 tablet 1  . Calcium Carb-Cholecalciferol (CALCIUM 600 + D PO) Take 1 tablet by mouth daily.    . carvedilol (COREG) 25 MG tablet  Take 1 tablet (25 mg total) by mouth 2 (two) times daily with a meal. (Patient taking differently: Take 25 mg by mouth 2 (two) times daily with a meal. Can take additional half-tab if HR >100 and SBP >120.) 180 tablet 1  . diltiazem (CARDIZEM CD) 120 MG 24 hr capsule Take 1 capsule (120 mg total) by mouth daily. 90 capsule 3  . estradiol (ESTRACE) 0.1 MG/GM vaginal cream Place 1 Applicatorful vaginally 2 (two) times a week.    . famotidine (PEPCID) 20 MG tablet Take 1 tablet (20 mg total) by mouth 2 (two) times daily as needed for heartburn or indigestion. 180 tablet 3  . Hypromellose (ARTIFICIAL TEARS OP) Place 1 drop into both eyes 2 (two) times daily.    . Probiotic Product (PROBIOTIC DAILY PO) Take 1 capsule by mouth daily.     [name] tiZANidine (ZANAFLEX) 2 MG tablet Take 0.5-2 tablets (1-4 mg total) by mouth at bedtime as needed for muscle spasms. 30 tablet 0   No current facility-administered medications for this encounter.    Allergies  Allergen Reactions  . Morphine And Related Swelling  . Statins Other (See Comments)    Myalgias, weakness  . Sulfa Antibiotics Swelling and Rash    Social History   Socioeconomic History  . Marital status: Single    Spouse name: Not on file  . Number of children: Not on file  . Years of education: Not on file  . Highest education level: Not on file  Occupational History  . Not on file  Tobacco Use  . Smoking status: Never Smoker  . Smokeless tobacco: Never Used  Substance and Sexual Activity  . Alcohol use: No    Alcohol/week: 0.0 standard drinks  . Drug use: No  . Sexual activity: Never    Comment: divorced, widowed, lives with elderly parents, is the caregiver, no dietary restrictions.   Other Topics Concern  . Not on file  Social History Narrative  . Not on file   Social Determinants of Health   Financial Resource Strain:   . Difficulty of Paying Living Expenses:   Food Insecurity:   . Worried About Charity fundraiser in the Last  Year:   . Arboriculturist in the Last Year:   Transportation Needs:   . Film/video editor (Medical):   [name] Lack of Transportation (Non-Medical):   Physical Activity:   . Days of Exercise per Week:   . Minutes of Exercise per Session:   Stress:   . Feeling of Stress :   Social Connections:   . Frequency of Communication with Friends and Family:   . Frequency of Social Gatherings with Friends and Family:   . Attends Religious Services:   . Active Member of Clubs or Organizations:   . Attends Archivist Meetings:   .  Marital Status:   Intimate Partner Violence:   . Fear of Current or Ex-Partner:   . Emotionally Abused:   [name] Physically Abused:   . Sexually Abused:      ROS- All systems are reviewed and negative except as per the HPI above.  Physical Exam: Vitals:   11/13/19 1003  BP: (!) 160/90  Pulse: 63  Weight: 65.7 kg  Height: 5\' 1"  (1.549 m)    GEN- The patient is well appearing elderly female, alert and oriented x 3 today.   Head- normocephalic, atraumatic Eyes-  Sclera clear, conjunctiva pink Ears- hearing intact Oropharynx- clear Neck- supple  Lungs- Clear to ausculation bilaterally, normal work of breathing Heart- Regular rate and rhythm, no murmurs, rubs or gallops  GI- soft, NT, ND, + BS Extremities- no clubbing, cyanosis, or edema MS- no significant deformity or atrophy Skin- no rash or lesion Psych- euthymic mood, full affect Neuro- strength and sensation are intact  Wt Readings from Last 3 Encounters:  11/13/19 65.7 kg  11/08/19 65 kg  10/17/19 65.7 kg    EKG today demonstrates SR HR 63, PAC, PR 192, QRS 74, QTc 452  Echo 03/02/18 demonstrated  - Left ventricle: The cavity size was normal. Systolic function was  normal. The estimated ejection fraction was in the range of 55%  to 60%. Wall motion was normal; there were no regional wall  motion abnormalities. Left ventricular diastolic function  parameters were normal.  -  Aortic valve: Sclerosis without stenosis. There was mild  regurgitation.  - Mitral valve: Calcified annulus. Mildly thickened leaflets .  There was mild regurgitation.  - Atrial septum: No defect or patent foramen ovale was identified.  - Pulmonary arteries: PA peak pressure: 35 mm Hg (S).   Epic records are reviewed at length today  CHA2DS2-VASc Score = 4  The patient's score is based upon: CHF History: 0 HTN History: 1 Age : 2 Diabetes History: 0 Stroke History: 0 Vascular Disease History: 0 Gender: 1      ASSESSMENT AND PLAN: 1. Persistent Atrial Fibrillation (ICD10:  I48.19) The patient's CHA2DS2-VASc score is 4, indicating a 4.8% annual risk of stroke.   Patient back in SR today, will not pursue DCCV. We discussed therapeutic options today including AAD (flecainide). Patient would like to continue on her current medications and consider AAD if she should have recurrence of her afib. Continue Coreg 25 mg BID Continue diltiazem 120 mg daily Continue Eliquis 5 mg BID  2. Secondary Hypercoagulable State (ICD10:  D68.69) The patient is at significant risk for stroke/thromboembolism based upon her CHA2DS2-VASc Score of 4.  Continue Apixaban (Eliquis).   3. HTN Elevated today but patient brings in a BP log which shows excellent control. No changes today.   Follow up with Dr Gwenlyn Found and Dr Debara Pickett as scheduled. AF clinic in 3 months.    [facility] [address] [place], [place] [address] [phone] 11/13/2019 11:25 AM

## 2019-11-13 ENCOUNTER — Ambulatory Visit (HOSPITAL_COMMUNITY)
Admission: RE | Admit: 2019-11-13 | Discharge: 2019-11-13 | Disposition: A | Discharge status: Home / Self Care | Payer: Medicare HMO | Source: Ambulatory Visit | Attending: Physician Assistant | Admitting: Physician Assistant

## 2019-11-13 ENCOUNTER — Other Ambulatory Visit: Payer: Self-pay

## 2019-11-13 ENCOUNTER — Encounter (HOSPITAL_COMMUNITY): Payer: Self-pay | Admitting: Physician Assistant

## 2019-11-13 VITALS — BP 160/90 | HR 63 | Ht 61.0 in | Wt 144.8 lb

## 2019-11-13 DIAGNOSIS — D6869 Other thrombophilia: Secondary | ICD-10-CM | POA: Diagnosis not present

## 2019-11-13 DIAGNOSIS — Z8619 Personal history of other infectious and parasitic diseases: Secondary | ICD-10-CM | POA: Insufficient documentation

## 2019-11-13 DIAGNOSIS — Z8249 Family history of ischemic heart disease and other diseases of the circulatory system: Secondary | ICD-10-CM | POA: Diagnosis not present

## 2019-11-13 DIAGNOSIS — Z9049 Acquired absence of other specified parts of digestive tract: Secondary | ICD-10-CM | POA: Insufficient documentation

## 2019-11-13 DIAGNOSIS — K5790 Diverticulosis of intestine, part unspecified, without perforation or abscess without bleeding: Secondary | ICD-10-CM | POA: Diagnosis not present

## 2019-11-13 DIAGNOSIS — M1990 Unspecified osteoarthritis, unspecified site: Secondary | ICD-10-CM | POA: Insufficient documentation

## 2019-11-13 DIAGNOSIS — Z7901 Long term (current) use of anticoagulants: Secondary | ICD-10-CM | POA: Insufficient documentation

## 2019-11-13 DIAGNOSIS — F329 Major depressive disorder, single episode, unspecified: Secondary | ICD-10-CM | POA: Diagnosis not present

## 2019-11-13 DIAGNOSIS — I4819 Other persistent atrial fibrillation: Secondary | ICD-10-CM | POA: Diagnosis not present

## 2019-11-13 DIAGNOSIS — Z87442 Personal history of urinary calculi: Secondary | ICD-10-CM | POA: Diagnosis not present

## 2019-11-13 DIAGNOSIS — Z8349 Family history of other endocrine, nutritional and metabolic diseases: Secondary | ICD-10-CM | POA: Insufficient documentation

## 2019-11-13 DIAGNOSIS — Z79899 Other long term (current) drug therapy: Secondary | ICD-10-CM | POA: Insufficient documentation

## 2019-11-13 DIAGNOSIS — K219 Gastro-esophageal reflux disease without esophagitis: Secondary | ICD-10-CM | POA: Insufficient documentation

## 2019-11-13 DIAGNOSIS — F419 Anxiety disorder, unspecified: Secondary | ICD-10-CM | POA: Insufficient documentation

## 2019-11-13 DIAGNOSIS — Z85828 Personal history of other malignant neoplasm of skin: Secondary | ICD-10-CM | POA: Diagnosis not present

## 2019-11-13 DIAGNOSIS — E785 Hyperlipidemia, unspecified: Secondary | ICD-10-CM | POA: Insufficient documentation

## 2019-11-13 DIAGNOSIS — I480 Paroxysmal atrial fibrillation: Secondary | ICD-10-CM | POA: Insufficient documentation

## 2019-11-13 DIAGNOSIS — I10 Essential (primary) hypertension: Secondary | ICD-10-CM | POA: Diagnosis not present

## 2019-11-29 LAB — CUP PACEART REMOTE DEVICE CHECK
Date Time Interrogation Session: 20210602011752
Implantable Pulse Generator Implant Date: 20191018

## 2019-11-30 ENCOUNTER — Ambulatory Visit (INDEPENDENT_AMBULATORY_CARE_PROVIDER_SITE_OTHER): Payer: Medicare HMO | Admitting: *Deleted

## 2019-11-30 DIAGNOSIS — R55 Syncope and collapse: Secondary | ICD-10-CM | POA: Diagnosis not present

## 2019-12-05 NOTE — Progress Notes (Signed)
Carelink Summary Report / Loop Recorder 

## 2019-12-09 DIAGNOSIS — N10 Acute tubulo-interstitial nephritis: Secondary | ICD-10-CM | POA: Diagnosis not present

## 2019-12-09 DIAGNOSIS — R350 Frequency of micturition: Secondary | ICD-10-CM | POA: Diagnosis not present

## 2019-12-12 ENCOUNTER — Other Ambulatory Visit: Payer: Self-pay

## 2019-12-12 ENCOUNTER — Ambulatory Visit: Payer: Medicare HMO | Admitting: Cardiovascular Disease

## 2019-12-12 ENCOUNTER — Ambulatory Visit (HOSPITAL_BASED_OUTPATIENT_CLINIC_OR_DEPARTMENT_OTHER)
Admission: RE | Admit: 2019-12-12 | Discharge: 2019-12-12 | Disposition: A | Discharge status: Home / Self Care | Payer: Medicare HMO | Source: Ambulatory Visit | Attending: Family | Admitting: Family

## 2019-12-12 ENCOUNTER — Encounter: Payer: Self-pay | Admitting: Family

## 2019-12-12 ENCOUNTER — Ambulatory Visit (INDEPENDENT_AMBULATORY_CARE_PROVIDER_SITE_OTHER): Payer: Medicare HMO | Admitting: Family

## 2019-12-12 VITALS — BP 114/85 | HR 64 | Temp 97.6°F | Resp 16 | Ht 61.0 in | Wt 143.0 lb

## 2019-12-12 DIAGNOSIS — R319 Hematuria, unspecified: Secondary | ICD-10-CM

## 2019-12-12 DIAGNOSIS — I10 Essential (primary) hypertension: Secondary | ICD-10-CM

## 2019-12-12 DIAGNOSIS — N3001 Acute cystitis with hematuria: Secondary | ICD-10-CM

## 2019-12-12 DIAGNOSIS — E782 Mixed hyperlipidemia: Secondary | ICD-10-CM

## 2019-12-12 LAB — POC URINALSYSI DIPSTICK (AUTOMATED)
Bilirubin, UA: NEGATIVE
Glucose, UA: NEGATIVE
Ketones, UA: NEGATIVE
Nitrite, UA: NEGATIVE
Protein, UA: POSITIVE — AB
Spec Grav, UA: 1.02 (ref 1.010–1.025)
Urobilinogen, UA: NEGATIVE E.U./dL — AB
pH, UA: 5.5 (ref 5.0–8.0)

## 2019-12-12 MED ORDER — CEFDINIR 300 MG PO CAPS: 300.0000 mg | ORAL_CAPSULE | Freq: Two times a day (BID) | ORAL | 0 refills | Status: DC

## 2019-12-12 MED ORDER — ONDANSETRON HCL 4 MG PO TABS: 4.0000 mg | ORAL_TABLET | Freq: Three times a day (TID) | ORAL | 0 refills | Status: DC | PRN

## 2019-12-12 NOTE — Progress Notes (Signed)
Subjective:    Patient ID: Sophia [name], female    DOB: [birth year], 79 y.o.   MRN: [id number]  HPI  Patient is a 79 yr old female who presents today to discuss urinary tract symptoms.  Thursday she developed chills/fever. Saturday she developed urinary frequency and urinary incontinence which was new. She also had some pain in her lower back and generailized aching. She went to Urgent care- was treated for UTI with rocephin IM and cipro. Reports that she she just doesn't feel better. Still running a fever.  Needing to take tylenol for fever.  Her daughter accompanies her here today.  Daughter felt that she did better in the 24 hours following the IM Rocephin injection then she did after that on the Cipro.  She has a pesary in place. Dr. Benjie Karvonen at [facility] ob[facility]gyn.      Review of Systems Past Medical History:  Diagnosis Date  . Anxiety and depression   . Arthritis    hands, knees, etc  . Bladder prolapse, female, acquired 09/16/2014  . Bowen's disease   . Diverticulitis   . Diverticulosis   . GERD (gastroesophageal reflux disease)   . H/O hematuria    for several years, work only revealed kidney stone on right side  . History of chicken pox   . History of kidney stones   . Hyperlipidemia   . Hypertension   . Leg cramps, sleep related 04/02/2016  . Myalgia 04/25/2017  . Osteopenia 06/26/2014  . Osteoporosis 06/26/2014  . Plantar wart of [name] foot 10/15/2016  . Right shoulder pain 04/23/2017  . SCC (squamous cell carcinoma)    arms  . Statin intolerance 09/11/2019     Social History   Socioeconomic History  . Marital status: Single    Spouse name: Not on file  . Number of children: Not on file  . Years of education: Not on file  . Highest education level: Not on file  Occupational History  . Not on file  Tobacco Use  . Smoking status: Never Smoker  . Smokeless tobacco: Never Used  Vaping Use  . Vaping Use: Never used  Substance and Sexual Activity  . Alcohol  use: No    Alcohol/week: 0.0 standard drinks  . Drug use: No  . Sexual activity: Never    Comment: divorced, widowed, lives with elderly parents, is the caregiver, no dietary restrictions.   Other Topics Concern  . Not on file  Social History Narrative  . Not on file   Social Determinants of Health   Financial Resource Strain:   . Difficulty of Paying Living Expenses:   Food Insecurity:   . Worried About Charity fundraiser in the Last Year:   . Arboriculturist in the Last Year:   Transportation Needs:   . Film/video editor (Medical):   [name] Lack of Transportation (Non-Medical):   Physical Activity:   . Days of Exercise per Week:   . Minutes of Exercise per Session:   Stress:   . Feeling of Stress :   Social Connections:   . Frequency of Communication with Friends and Family:   . Frequency of Social Gatherings with Friends and Family:   . Attends Religious Services:   . Active Member of Clubs or Organizations:   . Attends Archivist Meetings:   [name] Marital Status:   Intimate Partner Violence:   . Fear of Current or Ex-Partner:   . Emotionally Abused:   [name] Physically  Abused:   . Sexually Abused:     Past Surgical History:  Procedure Laterality Date  . CHOLECYSTECTOMY  2005  . LOOP RECORDER INSERTION N/A 04/15/2018   Procedure: LOOP RECORDER INSERTION;  Surgeon: Sanda Klein, MD;  Location: [facility];  Service: Cardiovascular;  Laterality: N/A;  . SKIN BIOPSY     multiple    Family History  Problem Relation Age of Onset  . Heart disease Mother        aortic stenosis  . Hyperlipidemia Mother   . Hypertension Mother   . Heart attack Mother   . Cancer Mother        BCC, SCC, skin  . Hypertension Father   . Cancer Father        SCC, BCC, skin  . Stroke Brother   . Early death Daughter   . Heart disease Maternal Grandfather        MI  . Tuberculosis Paternal Grandmother   . Cancer Paternal Grandfather        head and neck cancer, chewed  tobacco  . Hypertension Daughter   . Heart disease Brother        arrythmia  . Hypertension Brother   . Hyperlipidemia Brother   . Birth defects Maternal Uncle     Allergies  Allergen Reactions  . Morphine And Related Swelling  . Statins Other (See Comments)    Myalgias, weakness  . Sulfa Antibiotics Swelling and Rash    Current Outpatient Medications on File Prior to Visit  Medication Sig Dispense Refill  . acetaminophen (TYLENOL) 500 MG tablet Take 1,000 mg by mouth 2 (two) times daily as needed for moderate pain or headache.    [name] apixaban (ELIQUIS) 5 MG TABS tablet Take 1 tablet (5 mg total) by mouth 2 (two) times daily. 180 tablet 1  . Calcium Carb-Cholecalciferol (CALCIUM 600 + D PO) Take 1 tablet by mouth daily.    . carvedilol (COREG) 25 MG tablet Take 1 tablet (25 mg total) by mouth 2 (two) times daily with a meal. (Patient taking differently: Take 25 mg by mouth 2 (two) times daily with a meal. Can take additional half-tab if HR >100 and SBP >120.) 180 tablet 1  . diltiazem (CARDIZEM CD) 120 MG 24 hr capsule Take 1 capsule (120 mg total) by mouth daily. 90 capsule 3  . estradiol (ESTRACE) 0.1 MG/GM vaginal cream Place 1 Applicatorful vaginally 2 (two) times a week.    . famotidine (PEPCID) 20 MG tablet Take 1 tablet (20 mg total) by mouth 2 (two) times daily as needed for heartburn or indigestion. 180 tablet 3  . Hypromellose (ARTIFICIAL TEARS OP) Place 1 drop into both eyes 2 (two) times daily.    . Probiotic Product (PROBIOTIC DAILY PO) Take 1 capsule by mouth daily.     [name] tiZANidine (ZANAFLEX) 2 MG tablet Take 0.5-2 tablets (1-4 mg total) by mouth at bedtime as needed for muscle spasms. 30 tablet 0   No current facility-administered medications on file prior to visit.    BP 114/85 (BP Location: Right Arm, Patient Position: Sitting, Cuff Size: Small)   Pulse 64   Temp 97.6 F (36.4 C) (Temporal)   Resp 16   Ht 5' 1"  (1.549 m)   Wt 143 lb (64.9 kg)   SpO2 99%   BMI  27.02 kg/m       Objective:   Physical Exam Constitutional:      Appearance: She is well-developed.  Neck:  Thyroid: No thyromegaly.  Cardiovascular:     Rate and Rhythm: Normal rate and regular rhythm.     Heart sounds: Normal heart sounds. No murmur heard.   Pulmonary:     Effort: Pulmonary effort is normal. No respiratory distress.     Breath sounds: Normal breath sounds. No wheezing.  Abdominal:     General: Bowel sounds are normal.     Palpations: Abdomen is soft.     Tenderness: There is no abdominal tenderness.  Musculoskeletal:     Cervical back: Neck supple.  Skin:    General: Skin is warm and dry.  Neurological:     Mental Status: She is alert and oriented to person, place, and time.  Psychiatric:        Behavior: Behavior normal.        Thought Content: Thought content normal.        Judgment: Judgment normal.           Assessment & Plan:  Fever-we will send urine for culture.  Due to lack of clinical response to Cipro, will DC Cipro and begin cefdinir.  Due to hematuria, will obtain a CT to rule out kidney stones.  We will also obtain a c-Met, CBC, and TSH for further evaluation.  She and the daughter are advised to call if symptoms worsen or if symptoms are not improved in 2 to 3 days.  They verbalized understanding.  This visit occurred during the SARS-CoV-2 public health emergency.  Safety protocols were in place, including screening questions prior to the visit, additional usage of staff PPE, and extensive cleaning of exam room while observing appropriate contact time as indicated for disinfecting solutions.

## 2019-12-12 NOTE — Patient Instructions (Addendum)
Stop cipro, start omnicef.  Call if symptoms worsen or if you are not improved in 1-2 days.  Complete CT scan on the first floor. Go to ER if you develop severe or worsening symptoms.

## 2019-12-13 LAB — CBC WITH DIFFERENTIAL/PLATELET
Basophils Absolute: 0 10*3/uL (ref 0.0–0.1)
Basophils Relative: 0.7 % (ref 0.0–3.0)
Eosinophils Absolute: 0 10*3/uL (ref 0.0–0.7)
Eosinophils Relative: 1.6 % (ref 0.0–5.0)
HCT: 35.4 % — ABNORMAL LOW (ref 36.0–46.0)
Hemoglobin: 12.1 g/dL (ref 12.0–15.0)
Lymphocytes Relative: 33.8 % (ref 12.0–46.0)
Lymphs Abs: 0.9 10*3/uL (ref 0.7–4.0)
MCHC: 34.3 g/dL (ref 30.0–36.0)
MCV: 91.5 fl (ref 78.0–100.0)
Monocytes Absolute: 0.3 10*3/uL (ref 0.1–1.0)
Monocytes Relative: 11 % (ref 3.0–12.0)
Neutro Abs: 1.5 10*3/uL (ref 1.4–7.7)
Neutrophils Relative %: 52.9 % (ref 43.0–77.0)
Platelets: 69 10*3/uL — ABNORMAL LOW (ref 150.0–400.0)
RBC: 3.87 Mil/uL (ref 3.87–5.11)
RDW: 14 % (ref 11.5–15.5)
WBC: 2.8 10*3/uL — ABNORMAL LOW (ref 4.0–10.5)

## 2019-12-13 LAB — URINE CULTURE
MICRO NUMBER:: 10593186
Result:: NO GROWTH
SPECIMEN QUALITY:: ADEQUATE

## 2019-12-13 LAB — COMPREHENSIVE METABOLIC PANEL
ALT: 245 U/L — ABNORMAL HIGH (ref 0–35)
AST: 311 U/L — ABNORMAL HIGH (ref 0–37)
Albumin: 3.8 g/dL (ref 3.5–5.2)
Alkaline Phosphatase: 289 U/L — ABNORMAL HIGH (ref 39–117)
BUN: 8 mg/dL (ref 6–23)
CO2: 23 mEq/L (ref 19–32)
Calcium: 8.3 mg/dL — ABNORMAL LOW (ref 8.4–10.5)
Chloride: 94 mEq/L — ABNORMAL LOW (ref 96–112)
Creatinine, Ser: 0.76 mg/dL (ref 0.40–1.20)
GFR: 73.69 mL/min (ref >60.00)
Glucose, Bld: 109 mg/dL — ABNORMAL HIGH (ref 70–99)
Potassium: 3.9 mEq/L (ref 3.5–5.1)
Sodium: 126 mEq/L — ABNORMAL LOW (ref 135–145)
Total Bilirubin: 0.9 mg/dL (ref 0.2–1.2)
Total Protein: 5.8 g/dL — ABNORMAL LOW (ref 6.0–8.3)

## 2019-12-13 LAB — LDL CHOLESTEROL, DIRECT: Direct LDL: 95 mg/dL

## 2019-12-13 LAB — LIPID PANEL
Cholesterol: 179 mg/dL (ref 0–200)
HDL: 38.3 mg/dL — ABNORMAL LOW (ref >39.00)
NonHDL: 141.16
Total CHOL/HDL Ratio: 5
Triglycerides: 252 mg/dL — ABNORMAL HIGH (ref 0.0–149.0)
VLDL: 50.4 mg/dL — ABNORMAL HIGH (ref 0.0–40.0)

## 2019-12-13 LAB — TSH: TSH: 2.4 u[IU]/mL (ref 0.35–4.50)

## 2019-12-14 ENCOUNTER — Telehealth: Payer: Self-pay | Admitting: Family

## 2019-12-14 ENCOUNTER — Other Ambulatory Visit: Payer: Self-pay

## 2019-12-14 ENCOUNTER — Ambulatory Visit (INDEPENDENT_AMBULATORY_CARE_PROVIDER_SITE_OTHER): Payer: Medicare HMO | Admitting: Family Medicine

## 2019-12-14 ENCOUNTER — Encounter: Payer: Self-pay | Admitting: Family Medicine

## 2019-12-14 VITALS — BP 133/62 | HR 68 | Temp 97.0°F | Resp 16 | Wt 147.0 lb

## 2019-12-14 DIAGNOSIS — I10 Essential (primary) hypertension: Secondary | ICD-10-CM | POA: Diagnosis not present

## 2019-12-14 DIAGNOSIS — R739 Hyperglycemia, unspecified: Secondary | ICD-10-CM | POA: Diagnosis not present

## 2019-12-14 DIAGNOSIS — R002 Palpitations: Secondary | ICD-10-CM | POA: Diagnosis not present

## 2019-12-14 DIAGNOSIS — R7989 Other specified abnormal findings of blood chemistry: Secondary | ICD-10-CM | POA: Diagnosis not present

## 2019-12-14 DIAGNOSIS — N8111 Cystocele, midline: Secondary | ICD-10-CM | POA: Diagnosis not present

## 2019-12-14 DIAGNOSIS — M549 Dorsalgia, unspecified: Secondary | ICD-10-CM

## 2019-12-14 DIAGNOSIS — R509 Fever, unspecified: Secondary | ICD-10-CM

## 2019-12-14 DIAGNOSIS — W57XXXD Bitten or stung by nonvenomous insect and other nonvenomous arthropods, subsequent encounter: Secondary | ICD-10-CM | POA: Diagnosis not present

## 2019-12-14 DIAGNOSIS — N814 Uterovaginal prolapse, unspecified: Secondary | ICD-10-CM | POA: Diagnosis not present

## 2019-12-14 DIAGNOSIS — L0390 Cellulitis, unspecified: Secondary | ICD-10-CM | POA: Diagnosis not present

## 2019-12-14 DIAGNOSIS — I4819 Other persistent atrial fibrillation: Secondary | ICD-10-CM

## 2019-12-14 DIAGNOSIS — N390 Urinary tract infection, site not specified: Secondary | ICD-10-CM | POA: Diagnosis not present

## 2019-12-14 NOTE — Patient Instructions (Signed)

## 2019-12-14 NOTE — Telephone Encounter (Signed)
Patient was seen by Dr. Charlett Blake today

## 2019-12-14 NOTE — Telephone Encounter (Signed)
Called patient several times but no answer, left urgent message to call us back asap

## 2019-12-14 NOTE — Telephone Encounter (Signed)
Please contact Sophia [name] and let her know that her lab work shows that she likely has some type of GI infection going on.  I would really like her to get back in to see Dr. Charlett Blake this afternoon (she has agreed to see her so please ask her if you need help scheduling).  I tried the Sophia [name] and the daughter on both numbers listed and there was no answer.

## 2019-12-15 ENCOUNTER — Ambulatory Visit: Payer: Medicare HMO | Admitting: Family

## 2019-12-15 ENCOUNTER — Telehealth: Payer: Self-pay | Admitting: Family Medicine

## 2019-12-15 ENCOUNTER — Telehealth: Payer: Self-pay | Admitting: *Deleted

## 2019-12-15 DIAGNOSIS — Z0289 Encounter for other administrative examinations: Secondary | ICD-10-CM

## 2019-12-15 LAB — MAGNESIUM: Magnesium: 2 mg/dL (ref 1.5–2.5)

## 2019-12-15 LAB — CBC WITH DIFFERENTIAL/PLATELET
Basophils Absolute: 0.1 10*3/uL (ref 0.0–0.1)
Basophils Relative: 1.1 % (ref 0.0–3.0)
Eosinophils Absolute: 0.1 10*3/uL (ref 0.0–0.7)
Eosinophils Relative: 1 % (ref 0.0–5.0)
HCT: 34.4 % — ABNORMAL LOW (ref 36.0–46.0)
Hemoglobin: 11.8 g/dL — ABNORMAL LOW (ref 12.0–15.0)
Lymphocytes Relative: 59.4 % — ABNORMAL HIGH (ref 12.0–46.0)
Lymphs Abs: 4.3 10*3/uL — ABNORMAL HIGH (ref 0.7–4.0)
MCHC: 34.2 g/dL (ref 30.0–36.0)
MCV: 91.6 fl (ref 78.0–100.0)
Monocytes Absolute: 0.6 10*3/uL (ref 0.1–1.0)
Monocytes Relative: 8.7 % (ref 3.0–12.0)
Neutro Abs: 2.1 10*3/uL (ref 1.4–7.7)
Neutrophils Relative %: 29.8 % — ABNORMAL LOW (ref 43.0–77.0)
Platelets: 112 10*3/uL — ABNORMAL LOW (ref 150.0–400.0)
RBC: 3.76 Mil/uL — ABNORMAL LOW (ref 3.87–5.11)
RDW: 13.8 % (ref 11.5–15.5)
WBC: 7.2 10*3/uL (ref 4.0–10.5)

## 2019-12-15 LAB — COMPREHENSIVE METABOLIC PANEL
ALT: 248 U/L — ABNORMAL HIGH (ref 0–35)
AST: 252 U/L — ABNORMAL HIGH (ref 0–37)
Albumin: 3.9 g/dL (ref 3.5–5.2)
Alkaline Phosphatase: 282 U/L — ABNORMAL HIGH (ref 39–117)
BUN: 13 mg/dL (ref 6–23)
CO2: 24 mEq/L (ref 19–32)
Calcium: 8.6 mg/dL (ref 8.4–10.5)
Chloride: 98 mEq/L (ref 96–112)
Creatinine, Ser: 0.86 mg/dL (ref 0.40–1.20)
GFR: 63.89 mL/min (ref >60.00)
Glucose, Bld: 107 mg/dL — ABNORMAL HIGH (ref 70–99)
Potassium: 4.1 mEq/L (ref 3.5–5.1)
Sodium: 131 mEq/L — ABNORMAL LOW (ref 135–145)
Total Bilirubin: 0.5 mg/dL (ref 0.2–1.2)
Total Protein: 6 g/dL (ref 6.0–8.3)

## 2019-12-15 LAB — SEDIMENTATION RATE: Sed Rate: 13 mm/hr (ref 0–30)

## 2019-12-15 LAB — C-REACTIVE PROTEIN: CRP: 2.1 mg/dL (ref 0.5–20.0)

## 2019-12-15 LAB — VITAMIN D 25 HYDROXY (VIT D DEFICIENCY, FRACTURES): VITD: 26.3 ng/mL — ABNORMAL LOW (ref 30.00–100.00)

## 2019-12-15 MED ORDER — VITAMIN D 50 MCG (2000 UT) PO TABS: 2000.0000 [IU] | ORAL_TABLET | Freq: Every day | ORAL | Status: AC

## 2019-12-15 NOTE — Telephone Encounter (Signed)
Caller: [name] (Daughter) Call back phone number: [phone]  Daughter states lab results are not better she was told that Lenna Sciara would be reviewing lab results while Dr. Charlett Blake is out of the office. She would like a call back before the office closes at 5:00 pm with instruction.  Please advise.

## 2019-12-15 NOTE — Telephone Encounter (Signed)
Melissa -- can you review in PCP's absence: Received call from [facility] at [facility]. States CBC result shows unusual increase in lymphocytes just 2 days from previous testing. Also notes significant difference of WBC count between the last 2 tests.

## 2019-12-15 NOTE — Telephone Encounter (Signed)
Reviewed labs-  Sodium is improving.  LFT's showing improvement. EBV IgG is elevated (could be previous infection). Urine culture negative. Vitamin D is a little low. Lyme testing is negative. RMSF negative.   Advised pt to add vit d OTC supplement 2000 mcg/day.   Patient reports that she if feeling much better today.  I advised pt and daughter that if she continues to improve clinically, she should follow up as scheduled with Dr. Charlett Blake on 7/1. They are advised that if new/worsening symptoms let us know and if severe go to ED. They both verbalize understanding.

## 2019-12-15 NOTE — Telephone Encounter (Signed)
Not unexpected assuming she is experiencing a bacterial or viral infection.   I see she has follow up in 2 weeks with Dr. Charlett Blake and it can be repeated at that time.

## 2019-12-15 NOTE — Telephone Encounter (Signed)
Thank you for reviewing Melissa. I will forward to Dr Charlett Blake for an [place].

## 2019-12-18 NOTE — Telephone Encounter (Signed)
Thanks, managed in lab result note

## 2019-12-18 NOTE — Telephone Encounter (Signed)
Thanks will just repeat cmp on 6/25 to follow trend.

## 2019-12-19 LAB — ROCKY MTN SPOTTED FVR ABS PNL(IGG+IGM)
RMSF IgG: NOT DETECTED
RMSF IgM: NOT DETECTED

## 2019-12-19 LAB — ADENOVIRUS ANTIBODIES: Adenovirus Antibody: <1:8 {titer}

## 2019-12-19 LAB — EPSTEIN-BARR VIRUS NUCLEAR ANTIGEN ANTIBODY, IGG: EBV NA IgG: 561 U/mL — ABNORMAL HIGH

## 2019-12-19 LAB — EHRLICHIA ANTIBODY PANEL
E. CHAFFEENSIS AB IGG: <1:64 {titer}
E. CHAFFEENSIS AB IGM: <1:20 {titer}

## 2019-12-19 LAB — B. BURGDORFI ANTIBODIES: B burgdorferi Ab IgG+IgM: <0.9 index

## 2019-12-22 DIAGNOSIS — W57XXXA Bitten or stung by nonvenomous insect and other nonvenomous arthropods, initial encounter: Secondary | ICD-10-CM | POA: Insufficient documentation

## 2019-12-22 DIAGNOSIS — R509 Fever, unspecified: Secondary | ICD-10-CM | POA: Insufficient documentation

## 2019-12-22 DIAGNOSIS — R7989 Other specified abnormal findings of blood chemistry: Secondary | ICD-10-CM | POA: Insufficient documentation

## 2019-12-22 NOTE — Assessment & Plan Note (Signed)
resoved within 24 hours of starting recent antibiotic. Likely related to urinary tract infection. Continue to hydrate well, complete course of antibiotics and take probiotics and cranberry tabs.

## 2019-12-22 NOTE — Progress Notes (Signed)
Subjective:    Patient ID: Sophia [name], female    DOB: 1941-01-17, 79 y.o.   MRN: [id number]  Chief Complaint  Patient presents with  . Follow-up    follow up due to abnormal labs    HPI Patient is in today for follow up on recent febrile illness. No recent hospitalizations. She was recently treated with antibiotics and she felt better within 24 hours. She was noting fevers, chills, myalgias, malaise, increased low back pain, urinary frequency, urgency and incontience. She now only has some frequency. No hematuria. Her appetite is improving. She has tolerated her antibiotics.   Past Medical History:  Diagnosis Date  . Anxiety and depression   . Arthritis    hands, knees, etc  . Bladder prolapse, female, acquired 09/16/2014  . Bowen's disease   . Diverticulitis   . Diverticulosis   . GERD (gastroesophageal reflux disease)   . H/O hematuria    for several years, work only revealed kidney stone on right side  . History of chicken pox   . History of kidney stones   . Hyperlipidemia   . Hypertension   . Leg cramps, sleep related 04/02/2016  . Myalgia 04/25/2017  . Osteopenia 06/26/2014  . Osteoporosis 06/26/2014  . Plantar wart of [name] foot 10/15/2016  . Right shoulder pain 04/23/2017  . SCC (squamous cell carcinoma)    arms  . Statin intolerance 09/11/2019    Past Surgical History:  Procedure Laterality Date  . CHOLECYSTECTOMY  2005  . LOOP RECORDER INSERTION N/A 04/15/2018   Procedure: LOOP RECORDER INSERTION;  Surgeon: Sanda Klein, MD;  Location: [facility];  Service: Cardiovascular;  Laterality: N/A;  . SKIN BIOPSY     multiple    Family History  Problem Relation Age of Onset  . Heart disease Mother        aortic stenosis  . Hyperlipidemia Mother   . Hypertension Mother   . Heart attack Mother   . Cancer Mother        BCC, SCC, skin  . Hypertension Father   . Cancer Father        SCC, BCC, skin  . Stroke Brother   . Early death Daughter    . Heart disease Maternal Grandfather        MI  . Tuberculosis Paternal Grandmother   . Cancer Paternal Grandfather        head and neck cancer, chewed tobacco  . Hypertension Daughter   . Heart disease Brother        arrythmia  . Hypertension Brother   . Hyperlipidemia Brother   . Birth defects Maternal Uncle     Social History   Socioeconomic History  . Marital status: Single    Spouse name: Not on file  . Number of children: Not on file  . Years of education: Not on file  . Highest education level: Not on file  Occupational History  . Not on file  Tobacco Use  . Smoking status: Never Smoker  . Smokeless tobacco: Never Used  Vaping Use  . Vaping Use: Never used  Substance and Sexual Activity  . Alcohol use: No    Alcohol/week: 0.0 standard drinks  . Drug use: No  . Sexual activity: Never    Comment: divorced, widowed, lives with elderly parents, is the caregiver, no dietary restrictions.   Other Topics Concern  . Not on file  Social History Narrative  . Not on file   Social Determinants  of Health   Financial Resource Strain:   . Difficulty of Paying Living Expenses:   Food Insecurity:   . Worried About Charity fundraiser in the Last Year:   . Arboriculturist in the Last Year:   Transportation Needs:   . Film/video editor (Medical):   [name] Lack of Transportation (Non-Medical):   Physical Activity:   . Days of Exercise per Week:   . Minutes of Exercise per Session:   Stress:   . Feeling of Stress :   Social Connections:   . Frequency of Communication with Friends and Family:   . Frequency of Social Gatherings with Friends and Family:   . Attends Religious Services:   . Active Member of Clubs or Organizations:   . Attends Archivist Meetings:   [name] Marital Status:   Intimate Partner Violence:   . Fear of Current or Ex-Partner:   . Emotionally Abused:   [name] Physically Abused:   . Sexually Abused:     Outpatient Medications Prior to Visit   Medication Sig Dispense Refill  . acetaminophen (TYLENOL) 500 MG tablet Take 1,000 mg by mouth 2 (two) times daily as needed for moderate pain or headache.    [name] apixaban (ELIQUIS) 5 MG TABS tablet Take 1 tablet (5 mg total) by mouth 2 (two) times daily. 180 tablet 1  . Calcium Carb-Cholecalciferol (CALCIUM 600 + D PO) Take 1 tablet by mouth daily.    . carvedilol (COREG) 25 MG tablet Take 1 tablet (25 mg total) by mouth 2 (two) times daily with a meal. (Patient taking differently: Take 25 mg by mouth 2 (two) times daily with a meal. Can take additional half-tab if HR >100 and SBP >120.) 180 tablet 1  . cefdinir (OMNICEF) 300 MG capsule Take 1 capsule (300 mg total) by mouth 2 (two) times daily. 14 capsule 0  . diltiazem (CARDIZEM CD) 120 MG 24 hr capsule Take 1 capsule (120 mg total) by mouth daily. 90 capsule 3  . estradiol (ESTRACE) 0.1 MG/GM vaginal cream Place 1 Applicatorful vaginally 2 (two) times a week.    . famotidine (PEPCID) 20 MG tablet Take 1 tablet (20 mg total) by mouth 2 (two) times daily as needed for heartburn or indigestion. 180 tablet 3  . Hypromellose (ARTIFICIAL TEARS OP) Place 1 drop into both eyes 2 (two) times daily.    . ondansetron (ZOFRAN) 4 MG tablet Take 1 tablet (4 mg total) by mouth every 8 (eight) hours as needed for nausea or vomiting. 20 tablet 0  . Probiotic Product (PROBIOTIC DAILY PO) Take 1 capsule by mouth daily.     [name] tiZANidine (ZANAFLEX) 2 MG tablet Take 0.5-2 tablets (1-4 mg total) by mouth at bedtime as needed for muscle spasms. 30 tablet 0   No facility-administered medications prior to visit.    Allergies  Allergen Reactions  . Morphine And Related Swelling  . Statins Other (See Comments)    Myalgias, weakness  . Sulfa Antibiotics Swelling and Rash    Review of Systems  Constitutional: Positive for malaise/fatigue. Negative for chills and fever.  HENT: Negative for congestion.   Eyes: Negative for blurred vision.  Respiratory: Negative for  shortness of breath.   Cardiovascular: Negative for chest pain, palpitations and leg swelling.  Gastrointestinal: Negative for abdominal pain, blood in stool and nausea.  Genitourinary: Positive for frequency. Negative for dysuria, flank pain, hematuria and urgency.  Musculoskeletal: Negative for falls.  Skin: Negative for rash.  Neurological: Negative for dizziness, loss of consciousness and headaches.  Endo/Heme/Allergies: Negative for environmental allergies.  Psychiatric/Behavioral: Negative for depression. The patient is not nervous/anxious.        Objective:    Physical Exam Vitals and nursing note reviewed.  Constitutional:      General: She is not in acute distress.    Appearance: She is well-developed.  HENT:     Head: Normocephalic and atraumatic.     Nose: Nose normal.  Eyes:     General:        Right eye: No discharge.        [name] eye: No discharge.  Cardiovascular:     Rate and Rhythm: Normal rate. Rhythm irregular.     Heart sounds: No murmur heard.   Pulmonary:     Effort: Pulmonary effort is normal.     Breath sounds: Normal breath sounds.  Abdominal:     General: Bowel sounds are normal.     Palpations: Abdomen is soft.     Tenderness: There is no abdominal tenderness.  Musculoskeletal:     Cervical back: Normal range of motion and neck supple.  Skin:    General: Skin is warm and dry.     Findings: Rash present.     Comments: Right arm and chest red, raised circular lesions  Neurological:     Mental Status: She is alert and oriented to person, place, and time.     BP 133/62 (BP Location: Right Arm, Patient Position: Sitting, Cuff Size: Small)   Pulse 68   Temp (!) 97 F (36.1 C) (Temporal)   Resp 16   Wt 147 lb (66.7 kg)   SpO2 98%   BMI 27.78 kg/m  Wt Readings from Last 3 Encounters:  12/14/19 147 lb (66.7 kg)  12/12/19 143 lb (64.9 kg)  11/13/19 144 lb 12.8 oz (65.7 kg)    Diabetic Foot Exam - Simple   No data filed     Lab Results   Component Value Date   WBC 7.2 12/14/2019   HGB 11.8 (L) 12/14/2019   HCT 34.4 (L) 12/14/2019   PLT 112.0 (L) 12/14/2019   GLUCOSE 107 (H) 12/14/2019   CHOL 179 12/12/2019   TRIG 252.0 (H) 12/12/2019   HDL 38.30 (L) 12/12/2019   LDLDIRECT 95.0 12/12/2019   LDLCALC 120 (H) 04/26/2018   ALT 248 (H) 12/14/2019   AST 252 (H) 12/14/2019   NA 131 (L) 12/14/2019   K 4.1 12/14/2019   CL 98 12/14/2019   CREATININE 0.86 12/14/2019   BUN 13 12/14/2019   CO2 24 12/14/2019   TSH 2.40 12/12/2019   HGBA1C 5.7 09/11/2019    Lab Results  Component Value Date   TSH 2.40 12/12/2019   Lab Results  Component Value Date   WBC 7.2 12/14/2019   HGB 11.8 (L) 12/14/2019   HCT 34.4 (L) 12/14/2019   MCV 91.6 12/14/2019   PLT 112.0 (L) 12/14/2019   Lab Results  Component Value Date   NA 131 (L) 12/14/2019   K 4.1 12/14/2019   CO2 24 12/14/2019   GLUCOSE 107 (H) 12/14/2019   BUN 13 12/14/2019   CREATININE 0.86 12/14/2019   BILITOT 0.5 12/14/2019   ALKPHOS 282 (H) 12/14/2019   AST 252 (H) 12/14/2019   ALT 248 (H) 12/14/2019   PROT 6.0 12/14/2019   ALBUMIN 3.9 12/14/2019   CALCIUM 8.6 12/14/2019   ANIONGAP 10 02/22/2018   GFR 63.89 12/14/2019   Lab Results  Component  Value Date   CHOL 179 12/12/2019   Lab Results  Component Value Date   HDL 38.30 (L) 12/12/2019   Lab Results  Component Value Date   LDLCALC 120 (H) 04/26/2018   Lab Results  Component Value Date   TRIG 252.0 (H) 12/12/2019   Lab Results  Component Value Date   CHOLHDL 5 12/12/2019   Lab Results  Component Value Date   HGBA1C 5.7 09/11/2019       Assessment & Plan:   Problem List Items Addressed This Visit    Hypertension   Palpitations   Relevant Orders   CBC w/Diff (Completed)   Sedimentation rate (Completed)   Magnesium (Completed)   Hyperglycemia    hgba1c acceptable, minimize simple carbs. Increase exercise as tolerated.      Persistent atrial fibrillation (HCC)    Rate controlled and  tolerating meds      Elevated liver function tests - Primary    Notably elevated but improved some on recheck and patient feeling much better. Will continue surveillance but likely associated with recent viral illness, patient will report any concerning symptoms      Relevant Orders   Comprehensive metabolic panel (Completed)   Epstein-Barr virus nuclear antigen antibody, IgG (Completed)   B. burgdorfi antibodies (Completed)   Adenovirus antibodies (Completed)   Bug bite    2 bug bits noted possibly had a secondary cellulitis that responded to antibiotics. No obvious target lesions. Symptomatically improved.       Relevant Orders   CBC w/Diff (Completed)   Rocky mtn spotted fvr abs pnl(IgG+IgM) (Completed)   Ehrlichia antibody panel (Completed)   Fever    resoved within 24 hours of starting recent antibiotic. Likely related to urinary tract infection. Continue to hydrate well, complete course of antibiotics and take probiotics and cranberry tabs.        Other Visit Diagnoses    Back pain, unspecified back location, unspecified back pain laterality, unspecified chronicity       Cellulitis, unspecified cellulitis site       Relevant Orders   C-reactive protein (Completed)   Hypocalcemia       Relevant Orders   Sedimentation rate (Completed)   VITAMIN D 25 Hydroxy (Vit-D Deficiency, Fractures) (Completed)      I am having Sophia [name] maintain her Probiotic Product (PROBIOTIC DAILY PO), estradiol, Calcium Carb-Cholecalciferol (CALCIUM 600 + D PO), acetaminophen, Hypromellose (ARTIFICIAL TEARS OP), apixaban, famotidine, tiZANidine, carvedilol, diltiazem, ondansetron, and cefdinir.  No orders of the defined types were placed in this encounter.    Penni Homans, MD

## 2019-12-22 NOTE — Assessment & Plan Note (Signed)
Notably elevated but improved some on recheck and patient feeling much better. Will continue surveillance but likely associated with recent viral illness, patient will report any concerning symptoms

## 2019-12-22 NOTE — Assessment & Plan Note (Signed)
hgba1c acceptable, minimize simple carbs. Increase exercise as tolerated.  

## 2019-12-22 NOTE — Assessment & Plan Note (Signed)
2 bug bits noted possibly had a secondary cellulitis that responded to antibiotics. No obvious target lesions. Symptomatically improved.

## 2019-12-22 NOTE — Assessment & Plan Note (Signed)
Rate controlled and tolerating meds.  

## 2019-12-25 ENCOUNTER — Other Ambulatory Visit: Payer: Self-pay

## 2019-12-25 ENCOUNTER — Telehealth: Payer: Self-pay

## 2019-12-25 ENCOUNTER — Other Ambulatory Visit (INDEPENDENT_AMBULATORY_CARE_PROVIDER_SITE_OTHER): Payer: Medicare HMO

## 2019-12-25 DIAGNOSIS — I10 Essential (primary) hypertension: Secondary | ICD-10-CM

## 2019-12-25 DIAGNOSIS — E7800 Pure hypercholesterolemia, unspecified: Secondary | ICD-10-CM

## 2019-12-25 LAB — LIPID PANEL
Cholesterol: 228 mg/dL — ABNORMAL HIGH (ref 0–200)
HDL: 31.8 mg/dL — ABNORMAL LOW (ref >39.00)
NonHDL: 196.23
Total CHOL/HDL Ratio: 7
Triglycerides: 269 mg/dL — ABNORMAL HIGH (ref 0.0–149.0)
VLDL: 53.8 mg/dL — ABNORMAL HIGH (ref 0.0–40.0)

## 2019-12-25 LAB — COMPREHENSIVE METABOLIC PANEL
ALT: 31 U/L (ref 0–35)
AST: 24 U/L (ref 0–37)
Albumin: 4.3 g/dL (ref 3.5–5.2)
Alkaline Phosphatase: 137 U/L — ABNORMAL HIGH (ref 39–117)
BUN: 14 mg/dL (ref 6–23)
CO2: 23 mEq/L (ref 19–32)
Calcium: 9.5 mg/dL (ref 8.4–10.5)
Chloride: 102 mEq/L (ref 96–112)
Creatinine, Ser: 0.85 mg/dL (ref 0.40–1.20)
GFR: 64.75 mL/min (ref >60.00)
Glucose, Bld: 91 mg/dL (ref 70–99)
Potassium: 4.3 mEq/L (ref 3.5–5.1)
Sodium: 136 mEq/L (ref 135–145)
Total Bilirubin: 0.7 mg/dL (ref 0.2–1.2)
Total Protein: 6.6 g/dL (ref 6.0–8.3)

## 2019-12-25 LAB — CBC
HCT: 38.1 % (ref 36.0–46.0)
Hemoglobin: 12.6 g/dL (ref 12.0–15.0)
MCHC: 33.2 g/dL (ref 30.0–36.0)
MCV: 93.6 fl (ref 78.0–100.0)
Platelets: 212 10*3/uL (ref 150.0–400.0)
RBC: 4.07 Mil/uL (ref 3.87–5.11)
RDW: 13.9 % (ref 11.5–15.5)
WBC: 11.1 10*3/uL — ABNORMAL HIGH (ref 4.0–10.5)

## 2019-12-25 LAB — TSH: TSH: 0.89 u[IU]/mL (ref 0.35–4.50)

## 2019-12-25 LAB — LDL CHOLESTEROL, DIRECT: Direct LDL: 137 mg/dL

## 2019-12-25 NOTE — Telephone Encounter (Signed)
Nurse Assessment Nurse: Rufina Falco, RN, Deb Date/Time Eilene Ghazi Time): 12/22/2019 8:11:06 PM Confirm and document reason for call. If symptomatic, describe symptoms. ---Caller states her mom has cellulitis due to a spider bite that has caused Liver and Kidney failure. She was supposed to have had a 10 day extended prescription of Cefdinir 300mg  BID and it was never called in. Preferred pharmacy is [facility] at [phone]. Has the patient had close contact with a person known or suspected to have the novel coronavirus illness OR traveled / lives in area with major community spread (including international travel) in the last 14 days from the onset of symptoms? * If Asymptomatic, screen for exposure and travel within the last 14 days. ---No Does the patient have any new or worsening symptoms? ---Yes Will a triage be completed? ---Yes Related visit to physician within the last 2 weeks? ---Yes Does the PT have any chronic conditions? (i.e. diabetes, asthma, this includes High risk factors for pregnancy, etc.) ---Yes List chronic conditions. ---HTN, Is this a behavioral health or substance abuse call? ---NoPLEASE NOTE: All timestamps contained within this report are represented as [place] Standard Time. CONFIDENTIALTY NOTICE: This fax transmission is intended only for the addressee. It contains information that is legally privileged, confidential or otherwise protected from use or disclosure. If you are not the intended recipient, you are strictly prohibited from reviewing, disclosing, copying using or disseminating any of this information or taking any action in reliance on or regarding this information. If you have received this fax in error, please notify us immediately by telephone so that we can arrange for its return to [place]. Phone: [phone], Toll-Free: [phone], Fax: [phone] Page: 2 of 3 Call Id: [id number] Guidelines Guideline Title Affirmed Question Affirmed Notes Nurse Date/Time  Eilene Ghazi Time) [place] [1] Red or very tender (to touch) area AND [2] started over 24 hours after the bite [place], RN, Deb 12/22/2019 8:14:27 PM Disp. Time Eilene Ghazi Time) Disposition Final User 12/22/2019 8:20:51 PM Send To RN Personal Rufina Falco, RN, Deb 12/22/2019 8:26:58 PM Called On-Call Provider [place], RN, Deb 12/22/2019 8:31:42 PM Pharmacy Call Rufina Falco, RN, Deb Reason: Rx called in to [facility] 12/22/2019 8:20:04 PM See PCP within 24 Hours Yes Rufina Falco, RN, Deb  Caller Disagree/Comply Comply Caller Understands Yes PreDisposition InappropriateToAsk Care Advice Given Per Guideline CARE ADVICE given per Spider Bite - [place] (Adult) guideline. CALL BACK IF: * Fever occurs * You become worse. SEE PCP WITHIN 24 HOURS: * IF OFFICE WILL BE OPEN: You need to be examined within the next 24 hours. Call your doctor (or NP/PA) when the office opens and make an appointment. LOCAL HEAT: * Warm water soaks or compresses for 15 minutes, 3 times per day. * Apply an antibiotic ointment 3 times per day. ANTIBIOTIC OINTMENT: Verbal Orders/Maintenance Medications Medication Refill Route Dosage Regime Duration Admin Instructions User Name Cefdinir Oral 300 mg 10 Days Take 1 tablet PO BID for 10 days. Dispense #20. No refills Rufina Falco, RN, Deb Referrals REFERRED TO PCP OFFICE Paging DoctorName Phone DateTime Result/Outcome Message Type Notes Renford Dills - MD [phone] 12/22/2019 8:26:58 PM Called On Call Provider - Reached Doctor Paged Renford Dills - MD 12/22/2019 8:27:19 PM Spoke with On Call - General Message Result On call gave verbal order for patient.

## 2019-12-28 ENCOUNTER — Telehealth (INDEPENDENT_AMBULATORY_CARE_PROVIDER_SITE_OTHER): Payer: Medicare HMO | Admitting: Family Medicine

## 2019-12-28 ENCOUNTER — Telehealth: Payer: Self-pay | Admitting: Family Medicine

## 2019-12-28 ENCOUNTER — Other Ambulatory Visit: Payer: Self-pay

## 2019-12-28 VITALS — BP 113/68 | HR 58 | Ht 61.0 in | Wt 140.5 lb

## 2019-12-28 DIAGNOSIS — K5790 Diverticulosis of intestine, part unspecified, without perforation or abscess without bleeding: Secondary | ICD-10-CM | POA: Diagnosis not present

## 2019-12-28 DIAGNOSIS — W57XXXD Bitten or stung by nonvenomous insect and other nonvenomous arthropods, subsequent encounter: Secondary | ICD-10-CM

## 2019-12-28 DIAGNOSIS — R7989 Other specified abnormal findings of blood chemistry: Secondary | ICD-10-CM

## 2019-12-28 DIAGNOSIS — R739 Hyperglycemia, unspecified: Secondary | ICD-10-CM

## 2019-12-28 DIAGNOSIS — E559 Vitamin D deficiency, unspecified: Secondary | ICD-10-CM | POA: Insufficient documentation

## 2019-12-28 DIAGNOSIS — N281 Cyst of kidney, acquired: Secondary | ICD-10-CM | POA: Diagnosis not present

## 2019-12-28 DIAGNOSIS — E782 Mixed hyperlipidemia: Secondary | ICD-10-CM

## 2019-12-28 NOTE — Assessment & Plan Note (Signed)
Likely benign but will refer to urology for consultation for reassurance and surveillance

## 2019-12-28 NOTE — Assessment & Plan Note (Signed)
Supplement and monitor, taking vitamin D 2000 IU daily.

## 2019-12-28 NOTE — Telephone Encounter (Signed)
Called patient Sophia [name]  To get her schded for 2 month follow up in sept.

## 2019-12-28 NOTE — Assessment & Plan Note (Signed)
hgba1c acceptable, minimize simple carbs. Increase exercise as tolerated.  

## 2019-12-28 NOTE — Assessment & Plan Note (Signed)
Encouraged heart healthy diet, increase exercise, avoid trans fats, consider a krill oil cap daily. Does not tolerate statins is scheduled for a consultation with lipid clinic tomorrow.

## 2019-12-28 NOTE — Assessment & Plan Note (Signed)
Symptoms have resolved.

## 2019-12-28 NOTE — Assessment & Plan Note (Signed)
No concerning symptoms continue to hydrate well and keep on current diet

## 2019-12-28 NOTE — Progress Notes (Signed)
Virtual Visit via Video Note  I connected with Sophia [name] on 12/28/19 at  8:20 AM EDT by a video enabled telemedicine application and verified that I am speaking with the correct person using two identifiers.  Location: Patient: home, patient in visit Provider: office, provider in visit   I discussed the limitations of evaluation and management by telemedicine and the availability of in person appointments. The patient expressed understanding and agreed to proceed. Marin Roberts CMA able to get the patient set up on a video visit    Subjective:    Patient ID: Sophia [name], female    DOB: May 13, 1941, 79 y.o.   MRN: [id number]  Chief Complaint  Patient presents with  . Follow-up    to go over abnormal labs    HPI Patient is in today for follow up on chronic medical concerns. She feels much better. No recurrent fevers. No recent hospitalizations. No persistent symptoms. She is eating better and hydrating well. Her strength and energy are improved. Denies CP/palp/SOB/HA/congestion/fevers/GI or GU c/o. Taking meds as prescribed  Past Medical History:  Diagnosis Date  . Anxiety and depression   . Arthritis    hands, knees, etc  . Bladder prolapse, female, acquired 09/16/2014  . Bowen's disease   . Diverticulitis   . Diverticulosis   . GERD (gastroesophageal reflux disease)   . H/O hematuria    for several years, work only revealed kidney stone on right side  . History of chicken pox   . History of kidney stones   . Hyperlipidemia   . Hypertension   . Leg cramps, sleep related 04/02/2016  . Myalgia 04/25/2017  . Osteopenia 06/26/2014  . Osteoporosis 06/26/2014  . Plantar wart of [name] foot 10/15/2016  . Right shoulder pain 04/23/2017  . SCC (squamous cell carcinoma)    arms  . Statin intolerance 09/11/2019    Past Surgical History:  Procedure Laterality Date  . CHOLECYSTECTOMY  2005  . LOOP RECORDER INSERTION N/A 04/15/2018   Procedure: LOOP RECORDER INSERTION;   Surgeon: Sanda Klein, MD;  Location: [facility];  Service: Cardiovascular;  Laterality: N/A;  . SKIN BIOPSY     multiple    Family History  Problem Relation Age of Onset  . Heart disease Mother        aortic stenosis  . Hyperlipidemia Mother   . Hypertension Mother   . Heart attack Mother   . Cancer Mother        BCC, SCC, skin  . Hypertension Father   . Cancer Father        SCC, BCC, skin  . Stroke Brother   . Early death Daughter   . Heart disease Maternal Grandfather        MI  . Tuberculosis Paternal Grandmother   . Cancer Paternal Grandfather        head and neck cancer, chewed tobacco  . Hypertension Daughter   . Heart disease Brother        arrythmia  . Hypertension Brother   . Hyperlipidemia Brother   . Birth defects Maternal Uncle     Social History   Socioeconomic History  . Marital status: Single    Spouse name: Not on file  . Number of children: Not on file  . Years of education: Not on file  . Highest education level: Not on file  Occupational History  . Not on file  Tobacco Use  . Smoking status: Never Smoker  . Smokeless tobacco:  Never Used  Vaping Use  . Vaping Use: Never used  Substance and Sexual Activity  . Alcohol use: No    Alcohol/week: 0.0 standard drinks  . Drug use: No  . Sexual activity: Never    Comment: divorced, widowed, lives with elderly parents, is the caregiver, no dietary restrictions.   Other Topics Concern  . Not on file  Social History Narrative  . Not on file   Social Determinants of Health   Financial Resource Strain:   . Difficulty of Paying Living Expenses:   Food Insecurity:   . Worried About Charity fundraiser in the Last Year:   . Arboriculturist in the Last Year:   Transportation Needs:   . Film/video editor (Medical):   [name] Lack of Transportation (Non-Medical):   Physical Activity:   . Days of Exercise per Week:   . Minutes of Exercise per Session:   Stress:   . Feeling of Stress :    Social Connections:   . Frequency of Communication with Friends and Family:   . Frequency of Social Gatherings with Friends and Family:   . Attends Religious Services:   . Active Member of Clubs or Organizations:   . Attends Archivist Meetings:   [name] Marital Status:   Intimate Partner Violence:   . Fear of Current or Ex-Partner:   . Emotionally Abused:   [name] Physically Abused:   . Sexually Abused:     Outpatient Medications Prior to Visit  Medication Sig Dispense Refill  . acetaminophen (TYLENOL) 500 MG tablet Take 1,000 mg by mouth 2 (two) times daily as needed for moderate pain or headache.    [name] apixaban (ELIQUIS) 5 MG TABS tablet Take 1 tablet (5 mg total) by mouth 2 (two) times daily. 180 tablet 1  . Calcium Carb-Cholecalciferol (CALCIUM 600 + D PO) Take 1 tablet by mouth daily.    . carvedilol (COREG) 25 MG tablet Take 1 tablet (25 mg total) by mouth 2 (two) times daily with a meal. (Patient taking differently: Take 25 mg by mouth 2 (two) times daily with a meal. Can take additional half-tab if HR >100 and SBP >120.) 180 tablet 1  . Cholecalciferol (VITAMIN D) 50 MCG (2000 UT) tablet Take 1 tablet (2,000 Units total) by mouth daily.    [name] diltiazem (CARDIZEM CD) 120 MG 24 hr capsule Take 1 capsule (120 mg total) by mouth daily. 90 capsule 3  . estradiol (ESTRACE) 0.1 MG/GM vaginal cream Place 1 Applicatorful vaginally 2 (two) times a week.    . famotidine (PEPCID) 20 MG tablet Take 1 tablet (20 mg total) by mouth 2 (two) times daily as needed for heartburn or indigestion. 180 tablet 3  . Hypromellose (ARTIFICIAL TEARS OP) Place 1 drop into both eyes 2 (two) times daily.    . ondansetron (ZOFRAN) 4 MG tablet Take 1 tablet (4 mg total) by mouth every 8 (eight) hours as needed for nausea or vomiting. 20 tablet 0  . Probiotic Product (PROBIOTIC DAILY PO) Take 1 capsule by mouth daily.     [name] tiZANidine (ZANAFLEX) 2 MG tablet Take 0.5-2 tablets (1-4 mg total) by mouth at bedtime as  needed for muscle spasms. 30 tablet 0  . cefdinir (OMNICEF) 300 MG capsule Take 1 capsule (300 mg total) by mouth 2 (two) times daily. 14 capsule 0   No facility-administered medications prior to visit.    Allergies  Allergen Reactions  . Morphine And Related Swelling  .  Statins Other (See Comments)    Myalgias, weakness  . Sulfa Antibiotics Swelling and Rash    Review of Systems  Constitutional: Negative for fever and malaise/fatigue.  HENT: Negative for congestion.   Eyes: Negative for blurred vision.  Respiratory: Negative for shortness of breath.   Cardiovascular: Negative for chest pain, palpitations and leg swelling.  Gastrointestinal: Negative for abdominal pain, blood in stool and nausea.  Genitourinary: Negative for dysuria and frequency.  Musculoskeletal: Negative for falls.  Skin: Negative for rash.  Neurological: Negative for dizziness, loss of consciousness and headaches.  Endo/Heme/Allergies: Negative for environmental allergies.  Psychiatric/Behavioral: Negative for depression. The patient is not nervous/anxious.        Objective:    Physical Exam Constitutional:      Appearance: Normal appearance. She is not ill-appearing.  HENT:     Head: Normocephalic and atraumatic.     Right Ear: External ear normal.     [name] Ear: External ear normal.     Nose: Nose normal.  Eyes:     General:        Right eye: No discharge.        [name] eye: No discharge.  Pulmonary:     Effort: Pulmonary effort is normal.  Neurological:     Mental Status: She is alert and oriented to person, place, and time.  Psychiatric:        Behavior: Behavior normal.     BP 113/68 (BP Location: [name] Arm, Patient Position: Sitting)   Pulse (!) 58   Ht 5\' 1"  (1.549 m)   Wt 140 lb 8 oz (63.7 kg)   BMI 26.55 kg/m  Wt Readings from Last 3 Encounters:  12/28/19 140 lb 8 oz (63.7 kg)  12/14/19 147 lb (66.7 kg)  12/12/19 143 lb (64.9 kg)    Diabetic Foot Exam - Simple   No data filed      Lab Results  Component Value Date   WBC 11.1 (H) 12/25/2019   HGB 12.6 12/25/2019   HCT 38.1 12/25/2019   PLT 212.0 12/25/2019   GLUCOSE 91 12/25/2019   CHOL 228 (H) 12/25/2019   TRIG 269.0 (H) 12/25/2019   HDL 31.80 (L) 12/25/2019   LDLDIRECT 137.0 12/25/2019   LDLCALC 120 (H) 04/26/2018   ALT 31 12/25/2019   AST 24 12/25/2019   NA 136 12/25/2019   K 4.3 12/25/2019   CL 102 12/25/2019   CREATININE 0.85 12/25/2019   BUN 14 12/25/2019   CO2 23 12/25/2019   TSH 0.89 12/25/2019   HGBA1C 5.7 09/11/2019    Lab Results  Component Value Date   TSH 0.89 12/25/2019   Lab Results  Component Value Date   WBC 11.1 (H) 12/25/2019   HGB 12.6 12/25/2019   HCT 38.1 12/25/2019   MCV 93.6 12/25/2019   PLT 212.0 12/25/2019   Lab Results  Component Value Date   NA 136 12/25/2019   K 4.3 12/25/2019   CO2 23 12/25/2019   GLUCOSE 91 12/25/2019   BUN 14 12/25/2019   CREATININE 0.85 12/25/2019   BILITOT 0.7 12/25/2019   ALKPHOS 137 (H) 12/25/2019   AST 24 12/25/2019   ALT 31 12/25/2019   PROT 6.6 12/25/2019   ALBUMIN 4.3 12/25/2019   CALCIUM 9.5 12/25/2019   ANIONGAP 10 02/22/2018   GFR 64.75 12/25/2019   Lab Results  Component Value Date   CHOL 228 (H) 12/25/2019   Lab Results  Component Value Date   HDL 31.80 (L) 12/25/2019  Lab Results  Component Value Date   LDLCALC 120 (H) 04/26/2018   Lab Results  Component Value Date   TRIG 269.0 (H) 12/25/2019   Lab Results  Component Value Date   CHOLHDL 7 12/25/2019   Lab Results  Component Value Date   HGBA1C 5.7 09/11/2019       Assessment & Plan:   Problem List Items Addressed This Visit    Hyperlipidemia    Encouraged heart healthy diet, increase exercise, avoid trans fats, consider a krill oil cap daily. Does not tolerate statins is scheduled for a consultation with lipid clinic tomorrow.       Diverticulosis    No concerning symptoms continue to hydrate well and keep on current diet       Hyperglycemia    hgba1c acceptable, minimize simple carbs. Increase exercise as tolerated.       Elevated liver function tests    Have returned to normal. No changes. Likely recent viral illness she feels completely recovered      Bug bite    Symptoms have resolved      Vitamin D deficiency    Supplement and monitor, taking vitamin D 2000 IU daily.      Complex renal cyst - Primary    Likely benign but will refer to urology for consultation for reassurance and surveillance      Relevant Orders   Ambulatory referral to Urology      I have discontinued Katharine Look L. Hoare's cefdinir. I am also having her maintain her Probiotic Product (PROBIOTIC DAILY PO), estradiol, Calcium Carb-Cholecalciferol (CALCIUM 600 + D PO), acetaminophen, Hypromellose (ARTIFICIAL TEARS OP), apixaban, famotidine, tiZANidine, carvedilol, diltiazem, ondansetron, and Vitamin D.  No orders of the defined types were placed in this encounter.     I discussed the assessment and treatment plan with the patient. The patient was provided an opportunity to ask questions and all were answered. The patient agreed with the plan and demonstrated an understanding of the instructions.   The patient was advised to call back or seek an in-person evaluation if the symptoms worsen or if the condition fails to improve as anticipated.  I provided 25 minutes of non-face-to-face time during this encounter.   Penni Homans, MD

## 2019-12-28 NOTE — Assessment & Plan Note (Signed)
Have returned to normal. No changes. Likely recent viral illness she feels completely recovered

## 2020-01-02 ENCOUNTER — Other Ambulatory Visit: Payer: Self-pay

## 2020-01-02 ENCOUNTER — Encounter: Payer: Self-pay | Admitting: Cardiovascular Disease

## 2020-01-02 ENCOUNTER — Ambulatory Visit (INDEPENDENT_AMBULATORY_CARE_PROVIDER_SITE_OTHER): Payer: Medicare HMO | Admitting: *Deleted

## 2020-01-02 ENCOUNTER — Ambulatory Visit: Payer: Medicare HMO | Admitting: Cardiovascular Disease

## 2020-01-02 DIAGNOSIS — I10 Essential (primary) hypertension: Secondary | ICD-10-CM

## 2020-01-02 DIAGNOSIS — R55 Syncope and collapse: Secondary | ICD-10-CM | POA: Diagnosis not present

## 2020-01-02 DIAGNOSIS — I480 Paroxysmal atrial fibrillation: Secondary | ICD-10-CM | POA: Diagnosis not present

## 2020-01-02 DIAGNOSIS — E782 Mixed hyperlipidemia: Secondary | ICD-10-CM | POA: Diagnosis not present

## 2020-01-02 LAB — CUP PACEART REMOTE DEVICE CHECK
Date Time Interrogation Session: 20210706015514
Implantable Pulse Generator Implant Date: 20191018

## 2020-01-02 NOTE — Assessment & Plan Note (Signed)
History of essential hypertension with blood pressure measured today at 172/82 although blood pressure is that she measures at home are much lower than it is in the therapeutic range.  She is on carvedilol and diltiazem.

## 2020-01-02 NOTE — Assessment & Plan Note (Signed)
History of hyperlipidemia scheduled to see Dr. Debara Pickett in the lipid clinic in the near future

## 2020-01-02 NOTE — Patient Instructions (Signed)
Medication Instructions:  Your physician recommends that you continue on your current medications as directed. Please refer to the Current Medication list given to you today.  *If you need a refill on your cardiac medications before your next appointment, please call your pharmacy*   Lab Work: NONE   Testing/Procedures: NONE   Follow-Up: At [facility], you and your health needs are our priority.  As part of our continuing mission to provide you with exceptional heart care, we have created designated Provider Care Teams.  These Care Teams include your primary Cardiologist (physician) and Advanced Practice Providers (APPs -  Physician Assistants and Nurse Practitioners) who all work together to provide you with the care you need, when you need it.  We recommend signing up for the patient portal called "MyChart".  Sign up information is provided on this After Visit Summary.  MyChart is used to connect with patients for Virtual Visits (Telemedicine).  Patients are able to view lab/test results, encounter notes, upcoming appointments, etc.  Non-urgent messages can be sent to your provider as well.   To learn more about what you can do with MyChart, go to [email or web address].    Your next appointment:   6 month(s) You will receive a reminder letter in the mail two months in advance. If you don't receive a letter, please call our office to schedule the follow-up appointment.   The format for your next appointment:   In Person  Provider:   You may see Quay Burow, MD or one of the following Advanced Practice Providers on your designated Care Team:    Kerin Ransom, PA-C  [place], [place]  Coletta Memos, [place]

## 2020-01-02 NOTE — Assessment & Plan Note (Signed)
History of paroxysmal atrial fibrillation on Eliquis oral anticoagulation.  She had a loop recorder implanted by Dr. Sallyanne Kuster for palpitations.  I did send her to the A. fib clinic on 11/13/2019 with the intent to perform outpatient DC cardioversion however she spontaneously converted to sinus rhythm and she feels clinically improved since that time.

## 2020-01-02 NOTE — Progress Notes (Signed)
This with     01/02/2020 Sophia [name]   05/01/1941  [id number]  Primary Physician Mosie Lukes, MD Primary Cardiologist: Lorretta Harp MD Garret Reddish, [place], [place]  HPI:  Sophia [name] is a 79 y.o.  mild to moderately overweight divorced Caucasian female mother of 3 children, grandmother of 5 grandchildren who is accompanied by one of her daughters, Sophia [name] (Sophia [name] is a patient of mine). She was referred by the ER for evaluation of chest pain. Her primary care provider is Dr. Gwyneth [name] .I last saw her in the office  11/08/2019. Risk factors include hyperlipidemia intolerant to statin therapy and family history with mother who had a myocardial infarction at age 52 as well as extensive vascular disease. 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 She is never had a heart attack or stroke. She has had palpitations evaluated by cardiologist in [place] 7 years ago which time a work-up included stress testing and event monitoring. She was placed on carvedilol at that time. She was seen in the emergency room at [facility] on 02/22/2018 with chest pain and palpitations. Pain began in the early morning hours and got worse. She thought it was indigestion. She did feel increasing palpitations as well as shortness of breath. She says she can also hear her "heartbeat" in her left ear. Her troponins were negative and her EKG showed no acute changes at that time.  When I saw her in the office on 11/08/2019 she was in A. fib with ventricular spots of 106.  I did add Cardizem CD 120 mg a day.  She was already on Eliquis.  I referred her to the A. fib clinic who saw her on 11/13/2019.  She had converted spontaneously.  She feels clinically improved.  She denies chest pain or shortness of breath.   Current Meds  Medication Sig  . acetaminophen (TYLENOL) 500 MG tablet Take 1,000 mg by mouth 2 (two) times daily as needed for moderate pain or  headache.  [name] apixaban (ELIQUIS) 5 MG TABS tablet Take 1 tablet (5 mg total) by mouth 2 (two) times daily.  . Calcium Carb-Cholecalciferol (CALCIUM 600 + D PO) Take 1 tablet by mouth daily.  . carvedilol (COREG) 25 MG tablet Take 1 tablet (25 mg total) by mouth 2 (two) times daily with a meal. (Patient taking differently: Take 25 mg by mouth 2 (two) times daily with a meal. Can take additional half-tab if HR >100 and SBP >120.)  . Cholecalciferol (VITAMIN D) 50 MCG (2000 UT) tablet Take 1 tablet (2,000 Units total) by mouth daily.  [name] diltiazem (CARDIZEM CD) 120 MG 24 hr capsule Take 1 capsule (120 mg total) by mouth daily.  [name] estradiol (ESTRACE) 0.1 MG/GM vaginal cream Place 1 Applicatorful vaginally 2 (two) times a week.  . famotidine (PEPCID) 20 MG tablet Take 1 tablet (20 mg total) by mouth 2 (two) times daily as needed for heartburn or indigestion.  . Hypromellose (ARTIFICIAL TEARS OP) Place 1 drop into both eyes 2 (two) times daily.  . ondansetron (ZOFRAN) 4 MG tablet Take 1 tablet (4 mg total) by mouth every 8 (eight) hours as needed for nausea or vomiting.  . Probiotic Product (PROBIOTIC DAILY PO) Take 1 capsule by mouth daily.   [name] tiZANidine (ZANAFLEX) 2 MG tablet Take 0.5-2 tablets (1-4 mg total) by mouth at bedtime as needed for muscle spasms.     Allergies  Allergen Reactions  .  Morphine And Related Swelling  . Statins Other (See Comments)    Myalgias, weakness  . Sulfa Antibiotics Swelling and Rash    Social History   Socioeconomic History  . Marital status: Single    Spouse name: Not on file  . Number of children: Not on file  . Years of education: Not on file  . Highest education level: Not on file  Occupational History  . Not on file  Tobacco Use  . Smoking status: Never Smoker  . Smokeless tobacco: Never Used  Vaping Use  . Vaping Use: Never used  Substance and Sexual Activity  . Alcohol use: No    Alcohol/week: 0.0 standard drinks  . Drug use: No  . Sexual  activity: Never    Comment: divorced, widowed, lives with elderly parents, is the caregiver, no dietary restrictions.   Other Topics Concern  . Not on file  Social History Narrative  . Not on file   Social Determinants of Health   Financial Resource Strain:   . Difficulty of Paying Living Expenses:   Food Insecurity:   . Worried About Charity fundraiser in the Last Year:   . Arboriculturist in the Last Year:   Transportation Needs:   . Film/video editor (Medical):   [name] Lack of Transportation (Non-Medical):   Physical Activity:   . Days of Exercise per Week:   . Minutes of Exercise per Session:   Stress:   . Feeling of Stress :   Social Connections:   . Frequency of Communication with Friends and Family:   . Frequency of Social Gatherings with Friends and Family:   . Attends Religious Services:   . Active Member of Clubs or Organizations:   . Attends Archivist Meetings:   [name] Marital Status:   Intimate Partner Violence:   . Fear of Current or Ex-Partner:   . Emotionally Abused:   [name] Physically Abused:   . Sexually Abused:      Review of Systems: General: negative for chills, fever, night sweats or weight changes.  Cardiovascular: negative for chest pain, dyspnea on exertion, edema, orthopnea, palpitations, paroxysmal nocturnal dyspnea or shortness of breath Dermatological: negative for rash Respiratory: negative for cough or wheezing Urologic: negative for hematuria Abdominal: negative for nausea, vomiting, diarrhea, bright red blood per rectum, melena, or hematemesis Neurologic: negative for visual changes, syncope, or dizziness All other systems reviewed and are otherwise negative except as noted above.    Blood pressure (!) 172/82, pulse 67, height 5\' 1"  (1.549 m), weight 141 lb (64 kg), SpO2 99 %.  General appearance: alert and no distress Neck: no adenopathy, no carotid bruit, no JVD, supple, symmetrical, trachea midline and thyroid not enlarged,  symmetric, no tenderness/mass/nodules Lungs: clear to auscultation bilaterally Heart: regular rate and rhythm, S1, S2 normal, no murmur, click, rub or gallop Extremities: extremities normal, atraumatic, no cyanosis or edema Pulses: 2+ and symmetric Skin: Skin color, texture, turgor normal. No rashes or lesions Neurologic: Alert and oriented X 3, normal strength and tone. Normal symmetric reflexes. Normal coordination and gait  EKG sinus rhythm at 65 with nonspecific ST and T wave changes.  Personally reviewed this EKG.  ASSESSMENT AND PLAN:   Hypertension History of essential hypertension with blood pressure measured today at 172/82 although blood pressure is that she measures at home are much lower than it is in the therapeutic range.  She is on carvedilol and diltiazem.  Hyperlipidemia History of hyperlipidemia scheduled to  see Dr. Debara Pickett in the lipid clinic in the near future  Paroxysmal atrial fibrillation [facility]) History of paroxysmal atrial fibrillation on Eliquis oral anticoagulation.  She had a loop recorder implanted by Dr. Sallyanne Kuster for palpitations.  I did send her to the A. fib clinic on 11/13/2019 with the intent to perform outpatient DC cardioversion however she spontaneously converted to sinus rhythm and she feels clinically improved since that time.      Lorretta Harp MD FACP,FACC,FAHA, [facility] 01/02/2020 4:32 PM

## 2020-01-03 NOTE — Progress Notes (Signed)
Carelink Summary Report / Loop Recorder 

## 2020-01-05 ENCOUNTER — Telehealth (INDEPENDENT_AMBULATORY_CARE_PROVIDER_SITE_OTHER): Payer: Medicare HMO | Admitting: Internal Medicine

## 2020-01-05 ENCOUNTER — Encounter: Payer: Self-pay | Admitting: Internal Medicine

## 2020-01-05 VITALS — BP 121/74 | HR 59 | Wt 140.0 lb

## 2020-01-05 DIAGNOSIS — T466X5A Adverse effect of antihyperlipidemic and antiarteriosclerotic drugs, initial encounter: Secondary | ICD-10-CM

## 2020-01-05 DIAGNOSIS — M791 Myalgia, unspecified site: Secondary | ICD-10-CM

## 2020-01-05 DIAGNOSIS — I10 Essential (primary) hypertension: Secondary | ICD-10-CM | POA: Diagnosis not present

## 2020-01-05 DIAGNOSIS — I480 Paroxysmal atrial fibrillation: Secondary | ICD-10-CM

## 2020-01-05 DIAGNOSIS — E782 Mixed hyperlipidemia: Secondary | ICD-10-CM

## 2020-01-05 NOTE — Progress Notes (Signed)
Virtual Visit via Telephone Note   This visit type was conducted due to national recommendations for restrictions regarding the COVID-19 Pandemic (e.g. social distancing) in an effort to limit this patient's exposure and mitigate transmission in our community.  Due to her co-morbid illnesses, this patient is at least at moderate risk for complications without adequate follow up.  This format is felt to be most appropriate for this patient at this time.  The patient did not have access to video technology/had technical difficulties with video requiring transitioning to audio format only (telephone).  All issues noted in this document were discussed and addressed.  No physical exam could be performed with this format.  Please refer to the patient's chart for her  consent to telehealth for [facility].   Evaluation Performed:  Telephone consult  Date:  01/05/2020   ID:  Sophia [name], DOB 04-Apr-1941, MRN [id number]  Patient Location:  [place] [address]  Provider location:   [address], [place] [address], [place] [address]  PCP:  Mosie Lukes, MD  Cardiologist:  Quay Burow, MD Electrophysiologist:  None   Chief Complaint:  Dyslipidemia  History of Present Illness:    Sophia [name] is a 79 y.o. female who presents via audio/video conferencing for a telehealth visit today.  This is a 79 year old female with a history of dyslipidemia and atrial fibrillation on Eliquis who has been to statins including having had myalgias and muscle weakness on atorvastatin right, red yeast rice, rosuvastatin and pravastatin in the past.  Although there are cardiovascular risk factors and family history of heart disease, she has no known coronary disease.  She was noted to have aortic atherosclerosis on a recent CT renal stone study performed in June 2021.  I suspect there is also coronary artery disease as well.  The patient does not have symptoms concerning for  COVID-19 infection (fever, chills, cough, or new SHORTNESS OF BREATH).    Prior CV studies:   The following studies were reviewed today:  Chart reviewed, labs reviewed  PMHx:  Past Medical History:  Diagnosis Date  . Anxiety and depression   . Arthritis    hands, knees, etc  . Bladder prolapse, female, acquired 09/16/2014  . Bowen's disease   . Diverticulitis   . Diverticulosis   . GERD (gastroesophageal reflux disease)   . H/O hematuria    for several years, work only revealed kidney stone on right side  . History of chicken pox   . History of kidney stones   . Hyperlipidemia   . Hypertension   . Leg cramps, sleep related 04/02/2016  . Myalgia 04/25/2017  . Osteopenia 06/26/2014  . Osteoporosis 06/26/2014  . Plantar wart of [name] foot 10/15/2016  . Right shoulder pain 04/23/2017  . SCC (squamous cell carcinoma)    arms  . Statin intolerance 09/11/2019    Past Surgical History:  Procedure Laterality Date  . CHOLECYSTECTOMY  2005  . LOOP RECORDER INSERTION N/A 04/15/2018   Procedure: LOOP RECORDER INSERTION;  Surgeon: Sanda Klein, MD;  Location: [facility];  Service: Cardiovascular;  Laterality: N/A;  . SKIN BIOPSY     multiple    FAMHx:  Family History  Problem Relation Age of Onset  . Heart disease Mother        aortic stenosis  . Hyperlipidemia Mother   . Hypertension Mother   . Heart attack Mother   . Cancer Mother  BCC, SCC, skin  . Hypertension Father   . Cancer Father        SCC, BCC, skin  . Stroke Brother   . Early death Daughter   . Heart disease Maternal Grandfather        MI  . Tuberculosis Paternal Grandmother   . Cancer Paternal Grandfather        head and neck cancer, chewed tobacco  . Hypertension Daughter   . Heart disease Brother        arrythmia  . Hypertension Brother   . Hyperlipidemia Brother   . Birth defects Maternal Uncle     SOCHx:   reports that she has never smoked. She has never used smokeless tobacco.  She reports that she does not drink alcohol and does not use drugs.  ALLERGIES:  Allergies  Allergen Reactions  . Morphine And Related Swelling  . Statins Other (See Comments)    Myalgias, weakness (atorvastatin, RYR, rosuvastatin, pravastatin)  . Sulfa Antibiotics Swelling and Rash    MEDS:  Current Meds  Medication Sig  . acetaminophen (TYLENOL) 500 MG tablet Take 1,000 mg by mouth 2 (two) times daily as needed for moderate pain or headache.  [name] apixaban (ELIQUIS) 5 MG TABS tablet Take 1 tablet (5 mg total) by mouth 2 (two) times daily.  . Calcium Carb-Cholecalciferol (CALCIUM 600 + D PO) Take 1 tablet by mouth daily.  . carvedilol (COREG) 25 MG tablet Take 1 tablet (25 mg total) by mouth 2 (two) times daily with a meal. (Patient taking differently: Take 25 mg by mouth 2 (two) times daily with a meal. Can take additional half-tab if HR >100 and SBP >120.)  . Cholecalciferol (VITAMIN D) 50 MCG (2000 UT) tablet Take 1 tablet (2,000 Units total) by mouth daily.  [name] diltiazem (CARDIZEM CD) 120 MG 24 hr capsule Take 1 capsule (120 mg total) by mouth daily.  [name] estradiol (ESTRACE) 0.1 MG/GM vaginal cream Place 1 Applicatorful vaginally 2 (two) times a week.  . famotidine (PEPCID) 20 MG tablet Take 1 tablet (20 mg total) by mouth 2 (two) times daily as needed for heartburn or indigestion.  . Hypromellose (ARTIFICIAL TEARS OP) Place 1 drop into both eyes 2 (two) times daily.  . ondansetron (ZOFRAN) 4 MG tablet Take 1 tablet (4 mg total) by mouth every 8 (eight) hours as needed for nausea or vomiting.  . Probiotic Product (PROBIOTIC DAILY PO) Take 1 capsule by mouth daily.   [name] tiZANidine (ZANAFLEX) 2 MG tablet Take 0.5-2 tablets (1-4 mg total) by mouth at bedtime as needed for muscle spasms.     ROS: Pertinent items noted in HPI and remainder of comprehensive ROS otherwise negative.  Labs/Other Tests and Data Reviewed:    Recent Labs: 12/14/2019: Magnesium 2.0 12/25/2019: ALT 31; BUN 14;  Creatinine, Ser 0.85; Hemoglobin 12.6; Platelets 212.0; Potassium 4.3; Sodium 136; TSH 0.89   Recent Lipid Panel Lab Results  Component Value Date/Time   CHOL 228 (H) 12/25/2019 01:31 PM   TRIG 269.0 (H) 12/25/2019 01:31 PM   HDL 31.80 (L) 12/25/2019 01:31 PM   CHOLHDL 7 12/25/2019 01:31 PM   LDLCALC 120 (H) 04/26/2018 11:56 AM   LDLDIRECT 137.0 12/25/2019 01:31 PM    Wt Readings from Last 3 Encounters:  01/05/20 140 lb (63.5 kg)  01/02/20 141 lb (64 kg)  12/28/19 140 lb 8 oz (63.7 kg)     Exam:    Vital Signs:  BP 121/74   Pulse (!) 59  Wt 140 lb (63.5 kg)   BMI 26.45 kg/m    Exam not performed due to telephone visit  ASSESSMENT & PLAN:    1. Mixed dyslipidemia 2. Statin intolerant-myalgias 3. Aortic atherosclerosis 4. Family history of coronary disease  Ms. Raburn has a mixed dyslipidemia but is intolerant to multiple statins and having also tried red yeast rice in the past.  She does have some aortic atherosclerosis and I suspect some coronary artery disease.  I have advised coronary artery calcium scoring to better with stratify her.  She may be a good candidate for PCSK9 inhibitor which we will investigate after results of her calcium scoring.  She is agreeable with this plan.  Thanks again for the kind referral.  COVID-19 Education: The signs and symptoms of COVID-19 were discussed with the patient and how to seek care for testing (follow up with PCP or arrange E-visit).  The importance of social distancing was discussed today.  Patient Risk:   After full review of this patients clinical status, I feel that they are at least moderate risk at this time.  Time:   Today, I have spent 25 minutes with the patient with telehealth technology discussing dyslipidemia, statin intolerance, aortic atherosclerosis.     Medication Adjustments/Labs and Tests Ordered: Current medicines are reviewed at length with the patient today.  Concerns regarding medicines are outlined  above.   Tests Ordered: Orders Placed This Encounter  Procedures  . CT CARDIAC SCORING  . Lipid panel    Medication Changes: No orders of the defined types were placed in this encounter.   Disposition:  in 4 month(s)  Pixie Casino, MD, [facility], [place] Director of the Advanced Lipid Disorders &  Cardiovascular Risk Reduction Clinic Diplomate of the American Board of Clinical Lipidology Attending Cardiologist  Direct Dial: [phone]  Fax: [phone]  Website:  [email or web address]  Pixie Casino, MD  01/05/2020 10:07 AM

## 2020-01-05 NOTE — Patient Instructions (Signed)
Medication Instructions:  Continue current medications - changes will be made based on calcium score results  *If you need a refill on your cardiac medications before your next appointment, please call your pharmacy*   Lab Work: Fasting lipid panel in about 3-4 months, before your next follow up with Dr. Debara Pickett   If you have labs (blood work) drawn today and your tests are completely normal, you will receive your results only by: [name] MyChart Message (if you have MyChart) OR . A paper copy in the mail If you have any lab test that is abnormal or we need to change your treatment, we will call you to review the results.  Testing/Procedures: Dr. Debara Pickett has ordered a CT coronary calcium score. This test is done at [address]. This is $150 out of pocket.   Coronary CalciumScan A coronary calcium scan is an imaging test used to look for deposits of calcium and other fatty materials (plaques) in the inner lining of the blood vessels of the heart (coronary arteries). These deposits of calcium and plaques can partly clog and narrow the coronary arteries without producing any symptoms or warning signs. This puts a person at risk for a heart attack. This test can detect these deposits before symptoms develop. Tell a health care provider about:  Any allergies you have.  All medicines you are taking, including vitamins, herbs, eye drops, creams, and over-the-counter medicines.  Any problems you or family members have had with anesthetic medicines.  Any blood disorders you have.  Any surgeries you have had.  Any medical conditions you have.  Whether you are pregnant or may be pregnant. What are the risks? Generally, this is a safe procedure. However, problems may occur, including:  Harm to a pregnant woman and her unborn baby. This test involves the use of radiation. Radiation exposure can be dangerous to a pregnant woman and her unborn baby. If you are pregnant, you generally  should not have this procedure done.  Slight increase in the risk of cancer. This is because of the radiation involved in the test. What happens before the procedure? No preparation is needed for this procedure. What happens during the procedure?  You will undress and remove any jewelry around your neck or chest.  You will put on a hospital gown.  Sticky electrodes will be placed on your chest. The electrodes will be connected to an electrocardiogram (ECG) machine to record a tracing of the electrical activity of your heart.  A CT scanner will take pictures of your heart. During this time, you will be asked to lie still and hold your breath for 2-3 seconds while a picture of your heart is being taken. The procedure may vary among health care providers and hospitals. What happens after the procedure?  You can get dressed.  You can return to your normal activities.  It is up to you to get the results of your test. Ask your health care provider, or the department that is doing the test, when your results will be ready. Summary  A coronary calcium scan is an imaging test used to look for deposits of calcium and other fatty materials (plaques) in the inner lining of the blood vessels of the heart (coronary arteries).  Generally, this is a safe procedure. Tell your health care provider if you are pregnant or may be pregnant.  No preparation is needed for this procedure.  A CT scanner will take pictures of your heart.  You  can return to your normal activities after the scan is done. This information is not intended to replace advice given to you by your health care provider. Make sure you discuss any questions you have with your health care provider. Document Released: 12/12/2007 Document Revised: 05/04/2016 Document Reviewed: 05/04/2016 Elsevier Interactive Patient Education  2017 [place]: At [facility], you and your health needs are our priority.  As part  of our continuing mission to provide you with exceptional heart care, we have created designated Provider Care Teams.  These Care Teams include your primary Cardiologist (physician) and Advanced Practice Providers (APPs -  Physician Assistants and Nurse Practitioners) who all work together to provide you with the care you need, when you need it.  We recommend signing up for the patient portal called "MyChart".  Sign up information is provided on this After Visit Summary.  MyChart is used to connect with patients for Virtual Visits (Telemedicine).  Patients are able to view lab/test results, encounter notes, upcoming appointments, etc.  Non-urgent messages can be sent to your provider as well.   To learn more about what you can do with MyChart, go to [email or web address].    Your next appointment:   3-4 month(s) - lipid clinic  The format for your next appointment:   Either In Person or Virtual  Provider:   K. Mali Hilty, MD   Other Instructions

## 2020-01-05 NOTE — Addendum Note (Signed)
Addended by: Merri Ray A on: 01/05/2020 01:39 PM   Modules accepted: Orders

## 2020-01-24 ENCOUNTER — Ambulatory Visit (INDEPENDENT_AMBULATORY_CARE_PROVIDER_SITE_OTHER)
Admission: RE | Admit: 2020-01-24 | Discharge: 2020-01-24 | Disposition: A | Discharge status: Home / Self Care | Payer: Self-pay | Source: Ambulatory Visit | Attending: Internal Medicine | Admitting: Internal Medicine

## 2020-01-24 ENCOUNTER — Other Ambulatory Visit: Payer: Self-pay

## 2020-01-24 DIAGNOSIS — E782 Mixed hyperlipidemia: Secondary | ICD-10-CM

## 2020-01-24 DIAGNOSIS — I700 Atherosclerosis of aorta: Secondary | ICD-10-CM | POA: Diagnosis not present

## 2020-01-25 ENCOUNTER — Telehealth: Payer: Self-pay | Admitting: *Deleted

## 2020-01-25 DIAGNOSIS — Z006 Encounter for examination for normal comparison and control in clinical research program: Secondary | ICD-10-CM

## 2020-01-25 NOTE — Telephone Encounter (Signed)
Spoke with pt about [employer] research study. Pt request that I email her some information on Evolocumab and the consent for her to look over and talk with her family. Will follow up with patient in a few weeks.

## 2020-01-25 NOTE — Progress Notes (Signed)
Thx Mali  JJB

## 2020-01-31 ENCOUNTER — Other Ambulatory Visit: Payer: Self-pay | Admitting: Cardiovascular Disease

## 2020-02-01 ENCOUNTER — Telehealth: Payer: Self-pay | Admitting: Internal Medicine

## 2020-02-01 NOTE — Telephone Encounter (Signed)
Patient is calling to follow up with Dr. Debara Pickett regarding cholesterol treatment. She states she is willing to begin taking the injection, as previously discussed. Please return call.

## 2020-02-01 NOTE — Telephone Encounter (Signed)
Had discussed with family about starting the injectable cholesterol med. She states she is willing to start now. Notified I would send this message to Dr.Hilty and his primary RN to make them aware. Pt states she also does not want to be a part of the trial study for this medication. She just didn't know if she needed to make [place] aware. No other questions from pt at this time.

## 2020-02-02 MED ORDER — REPATHA SURECLICK 140 MG/ML ~~LOC~~ SOAJ: 1.0000 | SUBCUTANEOUS | 11 refills | Status: DC

## 2020-02-02 NOTE — Telephone Encounter (Signed)
PA Case: [id number], Status: Approved, Coverage Starts on: 02/02/2020 12:00:00 AM, Coverage Ends on: 07/31/2020 12:00:00 AM

## 2020-02-02 NOTE — Telephone Encounter (Signed)
PA for Repatha Sureclick submitted via [place] (Key: [id number])

## 2020-02-02 NOTE — Addendum Note (Signed)
Addended by: Fidel Levy on: 02/02/2020 03:07 PM   Modules accepted: Orders

## 2020-02-02 NOTE — Telephone Encounter (Signed)
LM for patient that med was approved, Rx sent to [facility] and that MyChart message was sent with details also

## 2020-02-03 LAB — CUP PACEART REMOTE DEVICE CHECK
Date Time Interrogation Session: 20210806015342
Implantable Pulse Generator Implant Date: 20191018

## 2020-02-05 ENCOUNTER — Ambulatory Visit (INDEPENDENT_AMBULATORY_CARE_PROVIDER_SITE_OTHER): Payer: Medicare HMO | Admitting: *Deleted

## 2020-02-05 DIAGNOSIS — I480 Paroxysmal atrial fibrillation: Secondary | ICD-10-CM

## 2020-02-06 NOTE — Progress Notes (Signed)
Carelink Summary Report / Loop Recorder 

## 2020-03-07 LAB — CUP PACEART REMOTE DEVICE CHECK
Date Time Interrogation Session: 20210906015525
Implantable Pulse Generator Implant Date: 20191018

## 2020-03-11 ENCOUNTER — Ambulatory Visit (INDEPENDENT_AMBULATORY_CARE_PROVIDER_SITE_OTHER): Payer: Medicare HMO | Admitting: *Deleted

## 2020-03-11 DIAGNOSIS — I4819 Other persistent atrial fibrillation: Secondary | ICD-10-CM | POA: Diagnosis not present

## 2020-03-13 NOTE — Progress Notes (Signed)
Carelink Summary Report / Loop Recorder 

## 2020-03-26 ENCOUNTER — Encounter: Payer: Self-pay | Admitting: Family Medicine

## 2020-03-26 ENCOUNTER — Ambulatory Visit (INDEPENDENT_AMBULATORY_CARE_PROVIDER_SITE_OTHER): Payer: Medicare HMO | Admitting: Family Medicine

## 2020-03-26 ENCOUNTER — Other Ambulatory Visit: Payer: Self-pay

## 2020-03-26 VITALS — BP 144/88 | HR 64 | Temp 98.4°F | Resp 18 | Ht 61.0 in | Wt 139.4 lb

## 2020-03-26 DIAGNOSIS — R748 Abnormal levels of other serum enzymes: Secondary | ICD-10-CM

## 2020-03-26 DIAGNOSIS — T7840XA Allergy, unspecified, initial encounter: Secondary | ICD-10-CM

## 2020-03-26 MED ORDER — PREDNISONE 10 MG PO TABS: ORAL_TABLET | ORAL | 0 refills | Status: DC

## 2020-03-26 MED ORDER — METHYLPREDNISOLONE ACETATE 80 MG/ML IJ SUSP
80.0000 mg | Freq: Once | INTRAMUSCULAR | Status: AC
  Administered 2020-03-26: 80 mg via INTRAMUSCULAR

## 2020-03-26 NOTE — Progress Notes (Signed)
Patient ID: Sophia [name], female    DOB: 1940-11-28  Age: 79 y.o. MRN: [id number]    Subjective:  Subjective  HPI Sophia [name] presents for rash R upper arm after getting the second covid shot --- she is supposed to take the repatha shot today but is not sure she should  Rash is itchy--- cortisone cream is helping   Review of Systems  Constitutional: Negative for appetite change, diaphoresis, fatigue and unexpected weight change.  Eyes: Negative for pain, redness and visual disturbance.  Respiratory: Negative for cough, chest tightness, shortness of breath and wheezing.   Cardiovascular: Negative for chest pain, palpitations and leg swelling.  Endocrine: Negative for cold intolerance, heat intolerance, polydipsia, polyphagia and polyuria.  Genitourinary: Negative for difficulty urinating, dysuria and frequency.  Skin: Positive for rash.  Neurological: Negative for dizziness, light-headedness, numbness and headaches.    History Past Medical History:  Diagnosis Date   Anxiety and depression    Arthritis    hands, knees, etc   Bladder prolapse, female, acquired 09/16/2014   Bowen's disease    Diverticulitis    Diverticulosis    GERD (gastroesophageal reflux disease)    H/O hematuria    for several years, work only revealed kidney stone on right side   History of chicken pox    History of kidney stones    Hyperlipidemia    Hypertension    Leg cramps, sleep related 04/02/2016   Myalgia 04/25/2017   Osteopenia 06/26/2014   Osteoporosis 06/26/2014   Plantar wart of left foot 10/15/2016   Right shoulder pain 04/23/2017   SCC (squamous cell carcinoma)    arms   Statin intolerance 09/11/2019    She has a past surgical history that includes Cholecystectomy (2005); Skin biopsy; and LOOP RECORDER INSERTION (N/A, 04/15/2018).   Her family history includes Birth defects in her maternal uncle; Cancer in her father, mother, and paternal grandfather; Early  death in her daughter; Heart attack in her mother; Heart disease in her brother, maternal grandfather, and mother; Hyperlipidemia in her brother and mother; Hypertension in her brother, daughter, father, and mother; Stroke in her brother; Tuberculosis in her paternal grandmother.She reports that she has never smoked. She has never used smokeless tobacco. She reports that she does not drink alcohol and does not use drugs.  Current Outpatient Medications on File Prior to Visit  Medication Sig Dispense Refill   acetaminophen (TYLENOL) 500 MG tablet Take 1,000 mg by mouth 2 (two) times daily as needed for moderate pain or headache.     Calcium Carb-Cholecalciferol (CALCIUM 600 + D PO) Take 1 tablet by mouth daily.     carvedilol (COREG) 25 MG tablet Take 1 tablet (25 mg total) by mouth 2 (two) times daily with a meal. (Patient taking differently: Take 25 mg by mouth 2 (two) times daily with a meal. Can take additional half-tab if HR >100 and SBP >120.) 180 tablet 1   Cholecalciferol (VITAMIN D) 50 MCG (2000 UT) tablet Take 1 tablet (2,000 Units total) by mouth daily.     ELIQUIS 5 MG TABS tablet TAKE 1 TABLET(5 MG) BY MOUTH TWICE DAILY 180 tablet 1   estradiol (ESTRACE) 0.1 MG/GM vaginal cream Place 1 Applicatorful vaginally 2 (two) times a week.     Evolocumab (REPATHA SURECLICK) 812 MG/ML SOAJ Inject 1 Dose into the skin every 14 (fourteen) days. 2.1 mL 11   famotidine (PEPCID) 20 MG tablet Take 1 tablet (20 mg total) by mouth 2 (two) times  daily as needed for heartburn or indigestion. 180 tablet 3   Hypromellose (ARTIFICIAL TEARS OP) Place 1 drop into both eyes 2 (two) times daily.     ondansetron (ZOFRAN) 4 MG tablet Take 1 tablet (4 mg total) by mouth every 8 (eight) hours as needed for nausea or vomiting. 20 tablet 0   Probiotic Product (PROBIOTIC DAILY PO) Take 1 capsule by mouth daily.      tiZANidine (ZANAFLEX) 2 MG tablet Take 0.5-2 tablets (1-4 mg total) by mouth at bedtime as  needed for muscle spasms. 30 tablet 0   diltiazem (CARDIZEM CD) 120 MG 24 hr capsule Take 1 capsule (120 mg total) by mouth daily. 90 capsule 3   No current facility-administered medications on file prior to visit.     Objective:  Objective  Physical Exam Vitals and nursing note reviewed.  Constitutional:      Appearance: She is well-developed.  HENT:     Head: Normocephalic and atraumatic.  Eyes:     Conjunctiva/sclera: Conjunctivae normal.  Neck:     Thyroid: No thyromegaly.     Vascular: No carotid bruit or JVD.  Cardiovascular:     Rate and Rhythm: Normal rate and regular rhythm.     Heart sounds: Normal heart sounds. No murmur heard.   Pulmonary:     Effort: Pulmonary effort is normal. No respiratory distress.     Breath sounds: Normal breath sounds. No wheezing or rales.  Chest:     Chest wall: No tenderness.  Musculoskeletal:     Cervical back: Normal range of motion and neck supple.  Neurological:     Mental Status: She is alert and oriented to person, place, and time.    BP (!) 144/88 (BP Location: Left Arm, Patient Position: Sitting, Cuff Size: Normal)    Pulse 64    Temp 98.4 F (36.9 C) (Oral)    Resp 18    Ht 5\' 1"  (1.549 m)    Wt 139 lb 6.4 oz (63.2 kg)    SpO2 98%    BMI 26.34 kg/m  Wt Readings from Last 3 Encounters:  03/26/20 139 lb 6.4 oz (63.2 kg)  01/05/20 140 lb (63.5 kg)  01/02/20 141 lb (64 kg)     Lab Results  Component Value Date   WBC 11.1 (H) 12/25/2019   HGB 12.6 12/25/2019   HCT 38.1 12/25/2019   PLT 212.0 12/25/2019   GLUCOSE 91 12/25/2019   CHOL 228 (H) 12/25/2019   TRIG 269.0 (H) 12/25/2019   HDL 31.80 (L) 12/25/2019   LDLDIRECT 137.0 12/25/2019   LDLCALC 120 (H) 04/26/2018   ALT 31 12/25/2019   AST 24 12/25/2019   NA 136 12/25/2019   K 4.3 12/25/2019   CL 102 12/25/2019   CREATININE 0.85 12/25/2019   BUN 14 12/25/2019   CO2 23 12/25/2019   TSH 0.89 12/25/2019   HGBA1C 5.7 09/11/2019    CT CARDIAC SCORING  Addendum  Date: 01/24/2020   ADDENDUM REPORT: 01/24/2020 20:37 CLINICAL DATA:  Risk stratification EXAM: Coronary Calcium Score TECHNIQUE: The patient was scanned on a [employer] scanner. Axial non-contrast 3 mm slices were carried out through the heart. The data set was analyzed on a dedicated work station and scored using the [place]. FINDINGS: Non-cardiac: See separate report from [facility]. Ascending Aorta: Mildly dilated 3.8 cm at the level of the main PA bifurcation. Pericardium: Normal Coronary arteries: Normal origin.  Aortic atherosclerosis. IMPRESSION: Coronary calcium score of 304. This was  76th percentile for age and sex matched control Mildly dilated aorta to 3.8 cm at the level of the main PA bifurcation Aortic atherosclerosis Electronically Signed   By: Pixie Casino M.D.   On: 01/24/2020 20:37   Result Date: 01/24/2020 EXAM: OVER-READ INTERPRETATION  CT CHEST The following report is an over-read performed by radiologist Dr. Vinnie Langton of [facility], [place] on 01/24/2020. This over-read does not include interpretation of cardiac or coronary anatomy or pathology. The coronary calcium score interpretation by the cardiologist is attached. COMPARISON:  None. FINDINGS: Aortic atherosclerosis. Within the visualized portions of the thorax there are no suspicious appearing pulmonary nodules or masses, there is no acute consolidative airspace disease, no pleural effusions, no pneumothorax and no lymphadenopathy. Visualized portions of the upper abdomen demonstrates a 1 cm low-attenuation lesion in segment 2 of the liver, incompletely characterized on today's non-contrast CT examination, but statistically likely to represent a cyst. There are no aggressive appearing lytic or blastic lesions noted in the visualized portions of the skeleton. IMPRESSION: 1.  Aortic Atherosclerosis (ICD10-I70.0). Electronically Signed: By: Vinnie Langton M.D. On: 01/24/2020 13:37     Assessment & Plan:   Plan  I am having Katharine Look L. Rochelle start on predniSONE. I am also having her maintain her Probiotic Product (PROBIOTIC DAILY PO), estradiol, Calcium Carb-Cholecalciferol (CALCIUM 600 + D PO), acetaminophen, Hypromellose (ARTIFICIAL TEARS OP), famotidine, tiZANidine, carvedilol, diltiazem, ondansetron, Vitamin D, Eliquis, and Repatha SureClick. We administered methylPREDNISolone acetate.  Meds ordered this encounter  Medications   predniSONE (DELTASONE) 10 MG tablet    Sig: TAKE 3 TABLETS PO QD FOR 3 DAYS THEN TAKE 2 TABLETS PO QD FOR 3 DAYS THEN TAKE 1 TABLET PO QD FOR 3 DAYS THEN TAKE 1/2 TAB PO QD FOR 3 DAYS    Dispense:  20 tablet    Refill:  0   methylPREDNISolone acetate (DEPO-MEDROL) injection 80 mg    Problem List Items Addressed This Visit      Unprioritized   Allergic drug reaction - Primary    Depo medrol 80 im  pred taper        Relevant Medications   predniSONE (DELTASONE) 10 MG tablet    Other Visit Diagnoses    Elevated liver enzymes       Relevant Orders   Comprehensive metabolic panel      Follow-up: Return if symptoms worsen or fail to improve.  Ann Held, DO

## 2020-03-26 NOTE — Assessment & Plan Note (Signed)
Depo medrol 80 im  pred taper

## 2020-03-26 NOTE — Patient Instructions (Signed)
Drug Allergy A drug allergy happens when the body's disease-fighting system (immune system) reacts badly to a medicine. Drug allergies range from mild to severe. Some allergic reactions occur one week or more after you are exposed to a medicine (delayed reaction). A sudden (acute), severe allergic reaction that affects multiple areas of the body is called an anaphylactic reaction (anaphylaxis). Anaphylaxis can be life-threatening. All allergic reactions to a medicine require medical evaluation, even if the allergic reaction appears to be mild. What are the causes? This condition is caused by the immune system wrongly identifying a medicine as being harmful. When this happens, the body releases proteins (antibodies) and other compounds, such as histamine, into the bloodstream. This causes swelling in certain tissues and reduces blood flow to important areas, such as the heart and lungs. Almost any medicine can cause an allergic reaction. Medicines that commonly cause allergic reactions (are common allergens) include:  Penicillin.  Sulfa medicines (sulfonamides).  Medicines that numb certain areas of the body (local anesthetics).  X-ray dyes that contain iodine. What are the signs or symptoms? Common symptoms of a mild allergic reaction include:  Nasal congestion.  Tingling in the mouth.  An itchy, red rash. Common symptoms of a severe allergic reaction include:  Swelling of the eyes, lips, face, or tongue.  Swelling of the back of the mouth and the throat.  Wheezing.  A hoarse voice.  Itchy, red, swollen areas of skin (hives).  Dizziness or light-headedness.  Fainting.  Anxiety or confusion.  Abdominal pain.  Difficulty breathing, speaking, or swallowing.  Chest tightness.  Fast or irregular heartbeats (palpitations).  Vomiting.  Diarrhea. How is this diagnosed? This condition is diagnosed based on a physical exam and your history of recent exposure to one or more  medicines. You may be referred for follow-up testing by a health care provider who specializes in allergies. This testing can confirm the diagnosis of a drug allergy and determine which medicines you are allergic to. Testing may include:  Skin tests. These may involve: ? Injecting a small amount of the possible allergen between layers of your skin (intradermal injection). ? Applying patches to your skin.  Blood tests.  Drug challenge. For this test, a health care provider gives you a small amount of a medicine in gradual doses while watching for an allergic reaction. If you are unsure of what caused your allergic reaction, your health care provider may ask you for:  Information about all medicines that you take on a regular basis.  The date and time of your reaction. How is this treated? There is no cure for allergies. However, an allergic reaction can be treated with:  Medicines that help: ? Reduce pain and swelling (NSAIDs). ? Relieve itching and hives (antihistamines). ? Reduce swelling (corticosteroids).  Respiratory inhalers. These are inhaled medicines that help open (dilate) the airways in your lungs.  Injections of medicine that helps to relax the muscles in your airways and tighten your blood vessels (epinephrine). Severe allergic reactions, such as anaphylaxis, require immediate treatment in a hospital. You may need to be hospitalized for observation. You may also be prescribed rescue medicines, such as epinephrine. Epinephrine comes in many forms, including what is commonly called an auto-injector "pen" (pre-filled automatic epinephrine injection device). Follow these instructions at home: If you have a severe allergy   Always keep an auto-injector pen or your anaphylaxis kit near you. These can be lifesaving if you have a severe reaction. Use your auto-injector pen or anaphylaxis kit  as told by your health care provider.  Make sure that you, the members of your household,  and your employer know: ? How to use an anaphylaxis kit. ? How to use an auto-injector pen to give you an epinephrine injection.  Replace your epinephrine immediately after you use your auto-injector pen, in case you have another reaction.  Wear a medical alert bracelet or necklace that states your drug allergy, if told by your health care provider. General instructions  Avoidmedicines that you are allergic to.  Take over-the-counter and prescription medicines only as told by your health care provider.  If you were given medicines to treat your reaction, do not drive until your health care provider approves.  If you have hives or a rash: ? Use an over-the-counter antihistamineas told by your health care provider. ? Apply cold, wet cloths (cold compresses) to your skin or take baths or showers in cool water. Avoid hot water.  If you had tests done, it is up to you to get your test results. Ask your health care provider when your results will be ready.  Tell any health care providers who care for you that you have a drug allergy.  Keep all follow-up visits as told by your health care provider. This is important. Contact a health care provider if:  You think that you are having a mild allergic reaction. Symptoms of an allergic reaction usually start within 30 minutes after you are exposed to a medicine.  You have symptoms that last more than 2 days after your reaction.  Your symptoms get worse.  You develop new symptoms. Get help right away if:  You needed to use epinephrine. ? An epinephrine injection helps to manage life-threatening allergic reactions, but you still need to go to the emergency room even if epinephrine seems to work. This is important because anaphylaxis may happen again within 72 hours (rebound anaphylaxis). ? If you used epinephrine to treat anaphylaxis outside of the hospital, you need additional medical care. This may include more doses of epinephrine.  You  develop symptoms of a severe allergic reaction. These symptoms may represent a serious problem that is an emergency. Do not wait to see if the symptoms will go away. Use your auto-injector pen or anaphylaxis kit as you have been instructed, and get medical help right away. Call your local emergency services (911 in the [place] Do not drive yourself to the hospital. Summary  A drug allergy happens when the body's disease-fighting system reacts badly to a medicine.  Drug allergies range from mild to severe. In some cases, an allergic reaction may be life-threatening.  If you have a severe allergy, always keep an auto-injector pen or your anaphylaxis kit near you. This information is not intended to replace advice given to you by your health care provider. Make sure you discuss any questions you have with your health care provider. Document Revised: 12/29/2017 Document Reviewed: 12/29/2017 Elsevier Patient Education  2020 [employer]  

## 2020-03-27 NOTE — Addendum Note (Signed)
Addended by: Kelle Darting A on: 03/27/2020 09:26 AM   Modules accepted: Orders

## 2020-04-02 ENCOUNTER — Ambulatory Visit (INDEPENDENT_AMBULATORY_CARE_PROVIDER_SITE_OTHER): Payer: Medicare HMO | Admitting: Family Medicine

## 2020-04-02 ENCOUNTER — Other Ambulatory Visit: Payer: Self-pay

## 2020-04-02 VITALS — BP 124/80 | HR 82 | Temp 97.5°F | Wt 138.4 lb

## 2020-04-02 DIAGNOSIS — M2550 Pain in unspecified joint: Secondary | ICD-10-CM

## 2020-04-02 DIAGNOSIS — E559 Vitamin D deficiency, unspecified: Secondary | ICD-10-CM

## 2020-04-02 DIAGNOSIS — E782 Mixed hyperlipidemia: Secondary | ICD-10-CM | POA: Diagnosis not present

## 2020-04-02 DIAGNOSIS — T7840XA Allergy, unspecified, initial encounter: Secondary | ICD-10-CM

## 2020-04-02 DIAGNOSIS — R739 Hyperglycemia, unspecified: Secondary | ICD-10-CM | POA: Diagnosis not present

## 2020-04-02 DIAGNOSIS — M1990 Unspecified osteoarthritis, unspecified site: Secondary | ICD-10-CM

## 2020-04-02 DIAGNOSIS — I4819 Other persistent atrial fibrillation: Secondary | ICD-10-CM

## 2020-04-02 DIAGNOSIS — M791 Myalgia, unspecified site: Secondary | ICD-10-CM | POA: Diagnosis not present

## 2020-04-02 DIAGNOSIS — I10 Essential (primary) hypertension: Secondary | ICD-10-CM | POA: Diagnosis not present

## 2020-04-02 DIAGNOSIS — R7989 Other specified abnormal findings of blood chemistry: Secondary | ICD-10-CM | POA: Diagnosis not present

## 2020-04-02 DIAGNOSIS — K219 Gastro-esophageal reflux disease without esophagitis: Secondary | ICD-10-CM | POA: Diagnosis not present

## 2020-04-02 NOTE — Patient Instructions (Signed)
Polymyalgia Rheumatica Polymyalgia rheumatica (PMR) is an inflammatory disorder that causes the muscles and joints to ache and become stiff. Sometimes, PMR leads to a more dangerous condition that can cause vision loss (temporal arteritis or giant cell arteritis). What are the causes? The exact cause of PMR is not known. What increases the risk? You are more likely to develop this condition if you are:  Female.  78 years of age or older.  Caucasian. What are the signs or symptoms? Pain and stiffness are the main symptoms of PMR. Symptoms may:  Be worse after inactivity and in the morning.  Affect your: ? Hips, buttocks, and thighs. ? Neck, arms, and shoulders. This can make it hard to raise your arms above your head. ? Hands and wrists. Other symptoms include:  Fever.  Tiredness.  Weakness.  Depression.  Decreased appetite. This may lead to weight loss. Symptoms may start slowly or suddenly. How is this diagnosed? This condition is diagnosed with your medical history and a physical exam. You may need to see a health care provider who specializes in diseases of the joints, muscles, and bones (rheumatologist). You may also have tests, including:  Blood tests.  X-rays.  Ultrasound. How is this treated? PMR usually goes away without treatment, but it may take years. Your health care provider may recommend low-dose steroids and other medicines to help manage your symptoms of pain and stiffness. Regular exercise and rest will also help your symptoms. Follow these instructions at home:   Take over-the-counter and prescription medicines only as told by your health care provider.  Make sure to get enough rest and sleep.  Eat a healthy and nutritious diet.  Try to exercise most days of the week. Ask your health care provider what type of exercise is best for you.  Keep all follow-up visits as told by your health care provider. This is important. Contact a health care  provider if:  Your symptoms do not improve with medicine.  You have side effects from steroids. These may include: ? Weight gain. ? Swelling. ? Insomnia. ? Mood changes. ? Bruising. ? High blood sugar readings, if you have diabetes. ? Higher than normal blood pressure readings, if you monitor your blood pressure. Get help right away if:  You develop symptoms of temporal arteritis, such as: ? A change in vision. ? Severe headache. ? Scalp pain. ? Jaw pain. Summary  Polymyalgia rheumatica is an inflammatory disorder that causes aching and stiffness in your muscles and joints.  The exact cause of this condition is not known.  This condition usually goes away without treatment. Your health care provider may give you low-dose steroids to help manage your pain and stiffness.  Rest and regular exercise will help the symptoms. This information is not intended to replace advice given to you by your health care provider. Make sure you discuss any questions you have with your health care provider. Document Revised: 04/21/2018 Document Reviewed: 04/21/2018 Elsevier Patient Education  2020 [employer].

## 2020-04-03 ENCOUNTER — Telehealth: Payer: Self-pay | Admitting: Family Medicine

## 2020-04-03 NOTE — Telephone Encounter (Signed)
patient returning your call °

## 2020-04-03 NOTE — Assessment & Plan Note (Signed)
Rate controlled and tolerating current medications

## 2020-04-03 NOTE — Assessment & Plan Note (Addendum)
Encouraged heart healthy diet, increase exercise, avoid trans fats, consider a krill oil cap daily. Tolerating Repatha.

## 2020-04-03 NOTE — Assessment & Plan Note (Signed)
She notes arthralgias and myalgias which had become debilitating but when she took the Prednisone the pains resolved and have not returned yet. Check ANA and RF and consider rheumatology referral.

## 2020-04-03 NOTE — Assessment & Plan Note (Signed)
Her right arm became very red and uncomfortable after a covid shot. She was seen and started on steroids and her arm is much better. The rash never generalized.

## 2020-04-03 NOTE — Assessment & Plan Note (Signed)
Well controlled, no changes to meds. Encouraged heart healthy diet such as the DASH diet and exercise as tolerated.  °

## 2020-04-03 NOTE — Assessment & Plan Note (Signed)
hgba1c acceptable, minimize simple carbs. Increase exercise as tolerated.  

## 2020-04-03 NOTE — [facility] Notes (Signed)
Subjective:    Patient ID: Sophia [name], female    DOB: 09/24/1940, 79 y.o.   MRN: [id number]  Chief Complaint  Patient presents with  . Follow-up  . Hypertension    HPI Patient is in today for follow up on chronic medical concerns. No recent febrile illness or hospitalizations. She is accompanied by her daughter. Her arm rash and pain in her right arm resolved with steroids. The rash developed after her COVID shot. She notes her diffuse arthralgias and myalgias resolved with Prednisone. Her hips feel much better. Denies CP/palp/SOB/HA/congestion/fevers/GI or GU c/o. Taking meds as prescribed  Past Medical History:  Diagnosis Date  . Anxiety and depression   . Arthritis    hands, knees, etc  . Bladder prolapse, female, acquired 09/16/2014  . Bowen's disease   . Diverticulitis   . Diverticulosis   . GERD (gastroesophageal reflux disease)   . H/O hematuria    for several years, work only revealed kidney stone on right side  . History of chicken pox   . History of kidney stones   . Hyperlipidemia   . Hypertension   . Leg cramps, sleep related 04/02/2016  . Myalgia 04/25/2017  . Osteopenia 06/26/2014  . Osteoporosis 06/26/2014  . Plantar wart of [name] foot 10/15/2016  . Right shoulder pain 04/23/2017  . SCC (squamous cell carcinoma)    arms  . Statin intolerance 09/11/2019    Past Surgical History:  Procedure Laterality Date  . CHOLECYSTECTOMY  2005  . LOOP RECORDER INSERTION N/A 04/15/2018   Procedure: LOOP RECORDER INSERTION;  Surgeon: Sanda Klein, MD;  Location: [facility];  Service: Cardiovascular;  Laterality: N/A;  . SKIN BIOPSY     multiple    Family History  Problem Relation Age of Onset  . Heart disease Mother        aortic stenosis  . Hyperlipidemia Mother   . Hypertension Mother   . Heart attack Mother   . Cancer Mother        BCC, SCC, skin  . Hypertension Father   . Cancer Father        SCC, BCC, skin  . Stroke Brother   . Early  death Daughter   . Heart disease Maternal Grandfather        MI  . Tuberculosis Paternal Grandmother   . Cancer Paternal Grandfather        head and neck cancer, chewed tobacco  . Hypertension Daughter   . Heart disease Brother        arrythmia  . Hypertension Brother   . Hyperlipidemia Brother   . Birth defects Maternal Uncle     Social History   Socioeconomic History  . Marital status: Single    Spouse name: Not on file  . Number of children: Not on file  . Years of education: Not on file  . Highest education level: Not on file  Occupational History  . Not on file  Tobacco Use  . Smoking status: Never Smoker  . Smokeless tobacco: Never Used  Vaping Use  . Vaping Use: Never used  Substance and Sexual Activity  . Alcohol use: No    Alcohol/week: 0.0 standard drinks  . Drug use: No  . Sexual activity: Never    Comment: divorced, widowed, lives with elderly parents, is the caregiver, no dietary restrictions.   Other Topics Concern  . Not on file  Social History Narrative  . Not on file   Social Determinants of  Health   Financial Resource Strain:   . Difficulty of Paying Living Expenses: Not on file  Food Insecurity:   . Worried About Charity fundraiser in the Last Year: Not on file  . Ran Out of Food in the Last Year: Not on file  Transportation Needs:   . Lack of Transportation (Medical): Not on file  . Lack of Transportation (Non-Medical): Not on file  Physical Activity:   . Days of Exercise per Week: Not on file  . Minutes of Exercise per Session: Not on file  Stress:   . Feeling of Stress : Not on file  Social Connections:   . Frequency of Communication with Friends and Family: Not on file  . Frequency of Social Gatherings with Friends and Family: Not on file  . Attends Religious Services: Not on file  . Active Member of Clubs or Organizations: Not on file  . Attends Archivist Meetings: Not on file  . Marital Status: Not on file  Intimate  Partner Violence:   . Fear of Current or Ex-Partner: Not on file  . Emotionally Abused: Not on file  . Physically Abused: Not on file  . Sexually Abused: Not on file    Outpatient Medications Prior to Visit  Medication Sig Dispense Refill  . acetaminophen (TYLENOL) 500 MG tablet Take 1,000 mg by mouth 2 (two) times daily as needed for moderate pain or headache.    . Calcium Carb-Cholecalciferol (CALCIUM 600 + D PO) Take 1 tablet by mouth daily.    . carvedilol (COREG) 25 MG tablet Take 1 tablet (25 mg total) by mouth 2 (two) times daily with a meal. (Patient taking differently: Take 25 mg by mouth 2 (two) times daily with a meal. Can take additional half-tab if HR >100 and SBP >120.) 180 tablet 1  . Cholecalciferol (VITAMIN D) 50 MCG (2000 UT) tablet Take 1 tablet (2,000 Units total) by mouth daily.    [name] ELIQUIS 5 MG TABS tablet TAKE 1 TABLET(5 MG) BY MOUTH TWICE DAILY 180 tablet 1  . estradiol (ESTRACE) 0.1 MG/GM vaginal cream Place 1 Applicatorful vaginally 2 (two) times a week.    . Evolocumab (REPATHA SURECLICK) 161 MG/ML SOAJ Inject 1 Dose into the skin every 14 (fourteen) days. 2.1 mL 11  . famotidine (PEPCID) 20 MG tablet Take 1 tablet (20 mg total) by mouth 2 (two) times daily as needed for heartburn or indigestion. 180 tablet 3  . Hypromellose (ARTIFICIAL TEARS OP) Place 1 drop into both eyes 2 (two) times daily.    . ondansetron (ZOFRAN) 4 MG tablet Take 1 tablet (4 mg total) by mouth every 8 (eight) hours as needed for nausea or vomiting. 20 tablet 0  . predniSONE (DELTASONE) 10 MG tablet TAKE 3 TABLETS PO QD FOR 3 DAYS THEN TAKE 2 TABLETS PO QD FOR 3 DAYS THEN TAKE 1 TABLET PO QD FOR 3 DAYS THEN TAKE 1/2 TAB PO QD FOR 3 DAYS 20 tablet 0  . Probiotic Product (PROBIOTIC DAILY PO) Take 1 capsule by mouth daily.     [name] tiZANidine (ZANAFLEX) 2 MG tablet Take 0.5-2 tablets (1-4 mg total) by mouth at bedtime as needed for muscle spasms. 30 tablet 0  . diltiazem (CARDIZEM CD) 120 MG 24 hr  capsule Take 1 capsule (120 mg total) by mouth daily. 90 capsule 3   No facility-administered medications prior to visit.    Allergies  Allergen Reactions  . Morphine And Related Swelling  . Statins  Other (See Comments)    Myalgias, weakness (atorvastatin, RYR, rosuvastatin, pravastatin)  . Sulfa Antibiotics Swelling and Rash    Review of Systems  Constitutional: Positive for malaise/fatigue. Negative for fever.  HENT: Negative for congestion.   Eyes: Negative for blurred vision.  Respiratory: Negative for shortness of breath.   Cardiovascular: Negative for chest pain, palpitations and leg swelling.  Gastrointestinal: Negative for abdominal pain, blood in stool and nausea.  Genitourinary: Negative for dysuria and frequency.  Musculoskeletal: Positive for back pain, joint pain and myalgias. Negative for falls.  Skin: Negative for rash.  Neurological: Negative for dizziness, loss of consciousness and headaches.  Endo/Heme/Allergies: Negative for environmental allergies.  Psychiatric/Behavioral: Negative for depression. The patient is not nervous/anxious.        Objective:    Physical Exam  BP 124/80 (BP Location: [name] Arm, Patient Position: Sitting, Cuff Size: Normal)   Pulse 82   Temp (!) 97.5 F (36.4 C) (Oral)   Wt 138 lb 6.4 oz (62.8 kg)   SpO2 97%   BMI 26.15 kg/m  Wt Readings from Last 3 Encounters:  04/02/20 138 lb 6.4 oz (62.8 kg)  03/26/20 139 lb 6.4 oz (63.2 kg)  01/05/20 140 lb (63.5 kg)    Diabetic Foot Exam - Simple   No data filed     Lab Results  Component Value Date   WBC 13.4 (H) 04/02/2020   HGB 13.0 04/02/2020   HCT 40.0 04/02/2020   PLT 199 04/02/2020   GLUCOSE 104 (H) 04/02/2020   CHOL 175 04/02/2020   TRIG 258 (H) 04/02/2020   HDL 57 04/02/2020   LDLDIRECT 137.0 12/25/2019   LDLCALC 84 04/02/2020   ALT 14 04/02/2020   AST 14 04/02/2020   NA 134 (L) 04/02/2020   K 4.4 04/02/2020   CL 99 04/02/2020   CREATININE 0.90 04/02/2020    BUN 18 04/02/2020   CO2 23 04/02/2020   TSH 0.71 04/02/2020   HGBA1C 5.7 (H) 04/02/2020    Lab Results  Component Value Date   TSH 0.71 04/02/2020   Lab Results  Component Value Date   WBC 13.4 (H) 04/02/2020   HGB 13.0 04/02/2020   HCT 40.0 04/02/2020   MCV 93.0 04/02/2020   PLT 199 04/02/2020   Lab Results  Component Value Date   NA 134 (L) 04/02/2020   K 4.4 04/02/2020   CO2 23 04/02/2020   GLUCOSE 104 (H) 04/02/2020   BUN 18 04/02/2020   CREATININE 0.90 04/02/2020   BILITOT 0.6 04/02/2020   ALKPHOS 137 (H) 12/25/2019   AST 14 04/02/2020   ALT 14 04/02/2020   PROT 6.7 04/02/2020   ALBUMIN 4.3 12/25/2019   CALCIUM 9.3 04/02/2020   ANIONGAP 10 02/22/2018   GFR 64.75 12/25/2019   Lab Results  Component Value Date   CHOL 175 04/02/2020   Lab Results  Component Value Date   HDL 57 04/02/2020   Lab Results  Component Value Date   LDLCALC 84 04/02/2020   Lab Results  Component Value Date   TRIG 258 (H) 04/02/2020   Lab Results  Component Value Date   CHOLHDL 3.1 04/02/2020   Lab Results  Component Value Date   HGBA1C 5.7 (H) 04/02/2020       Assessment & Plan:   Problem List Items Addressed This Visit    Arthritis    She notes arthralgias and myalgias which had become debilitating but when she took the Prednisone the pains resolved and have not returned  yet. Check ANA and RF and consider rheumatology referral.      GERD (gastroesophageal reflux disease)   Hypertension - Primary    Well controlled, no changes to meds. Encouraged heart healthy diet such as the DASH diet and exercise as tolerated.       Relevant Orders   CBC with Differential/Platelet (Completed)   Comprehensive metabolic panel (Completed)   TSH (Completed)   Hyperlipidemia    Encouraged heart healthy diet, increase exercise, avoid trans fats, consider a krill oil cap daily. Tolerating Repatha.       Relevant Orders   Lipid panel (Completed)   Myalgia   Relevant Orders    Rheumatoid Factor (Completed)   Antinuclear Antib (ANA)   Hyperglycemia    hgba1c acceptable, minimize simple carbs. Increase exercise as tolerated.       Relevant Orders   Hemoglobin A1c (Completed)   Persistent atrial fibrillation (HCC)    Rate controlled and tolerating current medications      Elevated liver function tests    Likely caused by an acute viral illness has now returned to normal.       Vitamin D deficiency    Supplement and monitor      Relevant Orders   VITAMIN D 25 Hydroxy (Vit-D Deficiency, Fractures) (Completed)   Allergic drug reaction    Her right arm became very red and uncomfortable after a covid shot. She was seen and started on steroids and her arm is much better. The rash never generalized.        Other Visit Diagnoses    Arthralgia, unspecified joint       Relevant Orders   Rheumatoid Factor (Completed)   Antinuclear Antib (ANA)      I am having Amai L. Szeto maintain her Probiotic Product (PROBIOTIC DAILY PO), estradiol, Calcium Carb-Cholecalciferol (CALCIUM 600 + D PO), acetaminophen, Hypromellose (ARTIFICIAL TEARS OP), famotidine, tiZANidine, carvedilol, diltiazem, ondansetron, Vitamin D, Eliquis, Repatha SureClick, and predniSONE.  No orders of the defined types were placed in this encounter.    Penni Homans, MD

## 2020-04-03 NOTE — Assessment & Plan Note (Signed)
Supplement and monitor 

## 2020-04-03 NOTE — Assessment & Plan Note (Signed)
Likely caused by an acute viral illness has now returned to normal.

## 2020-04-05 ENCOUNTER — Other Ambulatory Visit: Payer: Self-pay

## 2020-04-05 DIAGNOSIS — E782 Mixed hyperlipidemia: Secondary | ICD-10-CM

## 2020-04-05 LAB — CUP PACEART REMOTE DEVICE CHECK
Date Time Interrogation Session: 20211007015918
Implantable Pulse Generator Implant Date: 20191018

## 2020-04-07 LAB — HEMOGLOBIN A1C
Hgb A1c MFr Bld: 5.7 % of total Hgb — ABNORMAL HIGH (ref <5.7)
Mean Plasma Glucose: 117 (calc)
eAG (mmol/L): 6.5 (calc)

## 2020-04-07 LAB — CBC WITH DIFFERENTIAL/PLATELET
Absolute Monocytes: 590 cells/uL (ref 200–950)
Basophils Absolute: 13 cells/uL (ref 0–200)
Basophils Relative: 0.1 %
Eosinophils Absolute: 0 cells/uL — ABNORMAL LOW (ref 15–500)
Eosinophils Relative: 0 %
HCT: 40 % (ref 35.0–45.0)
Hemoglobin: 13 g/dL (ref 11.7–15.5)
Lymphs Abs: 2439 cells/uL (ref 850–3900)
MCH: 30.2 pg (ref 27.0–33.0)
MCHC: 32.5 g/dL (ref 32.0–36.0)
MCV: 93 fL (ref 80.0–100.0)
MPV: 10.7 fL (ref 7.5–12.5)
Monocytes Relative: 4.4 %
Neutro Abs: 10358 cells/uL — ABNORMAL HIGH (ref 1500–7800)
Neutrophils Relative %: 77.3 %
Platelets: 199 10*3/uL (ref 140–400)
RBC: 4.3 10*6/uL (ref 3.80–5.10)
RDW: 13.9 % (ref 11.0–15.0)
Total Lymphocyte: 18.2 %
WBC: 13.4 10*3/uL — ABNORMAL HIGH (ref 3.8–10.8)

## 2020-04-07 LAB — ANA: Anti Nuclear Antibody (ANA): NEGATIVE

## 2020-04-07 LAB — COMPREHENSIVE METABOLIC PANEL
AG Ratio: 1.8 (calc) (ref 1.0–2.5)
ALT: 14 U/L (ref 6–29)
AST: 14 U/L (ref 10–35)
Albumin: 4.3 g/dL (ref 3.6–5.1)
Alkaline phosphatase (APISO): 72 U/L (ref 37–153)
BUN: 18 mg/dL (ref 7–25)
CO2: 23 mmol/L (ref 20–32)
Calcium: 9.3 mg/dL (ref 8.6–10.4)
Chloride: 99 mmol/L (ref 98–110)
Creat: 0.9 mg/dL (ref 0.60–0.93)
Globulin: 2.4 g/dL (calc) (ref 1.9–3.7)
Glucose, Bld: 104 mg/dL — ABNORMAL HIGH (ref 65–99)
Potassium: 4.4 mmol/L (ref 3.5–5.3)
Sodium: 134 mmol/L — ABNORMAL LOW (ref 135–146)
Total Bilirubin: 0.6 mg/dL (ref 0.2–1.2)
Total Protein: 6.7 g/dL (ref 6.1–8.1)

## 2020-04-07 LAB — LIPID PANEL
Cholesterol: 175 mg/dL (ref <200)
HDL: 57 mg/dL (ref >=50)
LDL Cholesterol (Calc): 84 mg/dL (calc)
Non-HDL Cholesterol (Calc): 118 mg/dL (calc) (ref <130)
Total CHOL/HDL Ratio: 3.1 (calc) (ref <5.0)
Triglycerides: 258 mg/dL — ABNORMAL HIGH (ref <150)

## 2020-04-07 LAB — RHEUMATOID FACTOR: Rheumatoid fact SerPl-aCnc: <14 IU/mL (ref <14)

## 2020-04-07 LAB — VITAMIN D 25 HYDROXY (VIT D DEFICIENCY, FRACTURES): Vit D, 25-Hydroxy: 37 ng/mL (ref 30–100)

## 2020-04-07 LAB — TSH: TSH: 0.71 mIU/L (ref 0.40–4.50)

## 2020-04-11 ENCOUNTER — Telehealth: Payer: Self-pay | Admitting: Family Medicine

## 2020-04-11 NOTE — [facility] Notes (Signed)
  Chronic Care Management   Outreach Note  04/11/2020 Name: Sophia [name] MRN: [id number] DOB: 02/28/1941  Referred by: Mosie Lukes, MD Reason for referral : No chief complaint on file.   An unsuccessful telephone outreach was attempted today. The patient was referred to the pharmacist for assistance with care management and care coordination.   Follow Up Plan:   Carley Perdue [employer] Scheduler

## 2020-04-12 ENCOUNTER — Telehealth: Payer: Self-pay | Admitting: Family Medicine

## 2020-04-12 NOTE — [facility] Notes (Signed)
  Chronic Care Management   Note  04/12/2020 Name: CELESTA FUNDERBURK MRN: [id number] DOB: 1940/10/02  [name] is a 79 y.o. year old female who is a primary care patient of Mosie Lukes, MD. I reached out to [name] by phone today in response to a referral sent by Ms. Andree Moro Wingerter's PCP, Mosie Lukes, MD.   Ms. Grumbine was given information about Chronic Care Management services today including:  1. CCM service includes personalized support from designated clinical staff supervised by her physician, including individualized plan of care and coordination with other care providers 2. 24/7 contact phone numbers for assistance for urgent and routine care needs. 3. Service will only be billed when office clinical staff spend 20 minutes or more in a month to coordinate care. 4. Only one practitioner may furnish and bill the service in a calendar month. 5. The patient may stop CCM services at any time (effective at the end of the month) by phone call to the office staff.   Patient wishes to consider information provided and/or speak with a member of the care team before deciding about enrollment in care management services.   Follow up plan:   Carley Perdue [employer] Scheduler

## 2020-04-12 NOTE — Telephone Encounter (Signed)
Copied from [place] [phone]. Topic: Medicare AWV >> Apr 12, 2020  2:09 PM Weston Anna wrote: Reason for CRM:  Left message cancelling  AWV on Nov 8,2021  due to [facility] not in office -srs

## 2020-04-15 ENCOUNTER — Other Ambulatory Visit: Payer: Self-pay

## 2020-04-15 ENCOUNTER — Other Ambulatory Visit (INDEPENDENT_AMBULATORY_CARE_PROVIDER_SITE_OTHER): Payer: Medicare HMO

## 2020-04-15 ENCOUNTER — Ambulatory Visit (INDEPENDENT_AMBULATORY_CARE_PROVIDER_SITE_OTHER): Payer: Medicare HMO

## 2020-04-15 DIAGNOSIS — E782 Mixed hyperlipidemia: Secondary | ICD-10-CM | POA: Diagnosis not present

## 2020-04-15 DIAGNOSIS — R55 Syncope and collapse: Secondary | ICD-10-CM | POA: Diagnosis not present

## 2020-04-16 LAB — COMPREHENSIVE METABOLIC PANEL
AG Ratio: 1.9 (calc) (ref 1.0–2.5)
ALT: 14 U/L (ref 6–29)
AST: 16 U/L (ref 10–35)
Albumin: 4.2 g/dL (ref 3.6–5.1)
Alkaline phosphatase (APISO): 71 U/L (ref 37–153)
BUN: 10 mg/dL (ref 7–25)
CO2: 27 mmol/L (ref 20–32)
Calcium: 9.1 mg/dL (ref 8.6–10.4)
Chloride: 100 mmol/L (ref 98–110)
Creat: 0.82 mg/dL (ref 0.60–0.93)
Globulin: 2.2 g/dL (calc) (ref 1.9–3.7)
Glucose, Bld: 95 mg/dL (ref 65–99)
Potassium: 4.6 mmol/L (ref 3.5–5.3)
Sodium: 135 mmol/L (ref 135–146)
Total Bilirubin: 0.8 mg/dL (ref 0.2–1.2)
Total Protein: 6.4 g/dL (ref 6.1–8.1)

## 2020-04-16 LAB — CBC WITH DIFFERENTIAL/PLATELET
Absolute Monocytes: 688 cells/uL (ref 200–950)
Basophils Absolute: 28 cells/uL (ref 0–200)
Basophils Relative: 0.3 %
Eosinophils Absolute: 0 cells/uL — ABNORMAL LOW (ref 15–500)
Eosinophils Relative: 0 %
HCT: 39.1 % (ref 35.0–45.0)
Hemoglobin: 13.1 g/dL (ref 11.7–15.5)
Lymphs Abs: 2781 cells/uL (ref 850–3900)
MCH: 31.1 pg (ref 27.0–33.0)
MCHC: 33.5 g/dL (ref 32.0–36.0)
MCV: 92.9 fL (ref 80.0–100.0)
MPV: 10.3 fL (ref 7.5–12.5)
Monocytes Relative: 7.4 %
Neutro Abs: 5803 cells/uL (ref 1500–7800)
Neutrophils Relative %: 62.4 %
Platelets: 152 10*3/uL (ref 140–400)
RBC: 4.21 10*6/uL (ref 3.80–5.10)
RDW: 14.1 % (ref 11.0–15.0)
Total Lymphocyte: 29.9 %
WBC: 9.3 10*3/uL (ref 3.8–10.8)

## 2020-04-19 ENCOUNTER — Telehealth: Payer: Self-pay | Admitting: Internal Medicine

## 2020-04-19 NOTE — Telephone Encounter (Signed)
° °  Pt c/o medication issue:  1. Name of Medication:  Evolocumab (REPATHA SURECLICK) 177 MG/ML SOAJ  ELIQUIS 5 MG TABS tablet    2. How are you currently taking this medication (dosage and times per day)?  N/a One tablet by mouth twice daily   3. Are you having a reaction (difficulty breathing--STAT)?  n/a n/a  4. What is your medication issue? Patient called in to let Dr. Debara Pickett or his nurse know that she was only able to take one dose of Repatha prior to having a reaction to the 2nd dose of the COVID vaccine. She was told to stop Repatha until blood work comes back after the reaction to the vaccine.   She would like to know what they want to do about getting her started back up on it, as well as if her 11/18 appointment with him needs to be rescheduled because of her stopping the repatha.   Patient also has questions in regards to obtaining assistance in paying for Eloquis.

## 2020-04-19 NOTE — Telephone Encounter (Signed)
Labs done 04/02/2020 after 1 dose of Repatha and has been holding med, per note below (which I was not aware of)  Will route to MD to advise on lab results

## 2020-04-19 NOTE — Progress Notes (Signed)
Carelink Summary Report / Loop Recorder 

## 2020-04-22 NOTE — Telephone Encounter (Signed)
Ok to resume Repatha - it is working. Recheck labs in 3 months. Ok to reschedule follow-up until then.  Dr Lemmie Evens

## 2020-04-22 NOTE — Telephone Encounter (Signed)
Patient states she had a reaction 10 days after Repatha dose, which was 2 days after COVID vaccine.  She will re-try Repatha  Moved appt from 05/16/20 >> 07/24/20 She is aware to have fasting labs prior to visit  She also needs Eliquis assistance. Sent her link to application via MyChart

## 2020-04-23 DIAGNOSIS — N281 Cyst of kidney, acquired: Secondary | ICD-10-CM | POA: Diagnosis not present

## 2020-04-30 ENCOUNTER — Other Ambulatory Visit: Payer: Self-pay | Admitting: Cardiovascular Disease

## 2020-05-06 ENCOUNTER — Ambulatory Visit: Payer: Self-pay | Admitting: *Deleted

## 2020-05-09 ENCOUNTER — Encounter: Payer: Self-pay | Admitting: Cardiovascular Disease

## 2020-05-09 ENCOUNTER — Ambulatory Visit: Payer: Medicare HMO | Admitting: Internal Medicine

## 2020-05-09 ENCOUNTER — Ambulatory Visit: Payer: Medicare HMO | Admitting: Cardiovascular Disease

## 2020-05-09 ENCOUNTER — Other Ambulatory Visit: Payer: Self-pay

## 2020-05-09 VITALS — BP 133/72 | HR 68 | Ht 61.0 in | Wt 139.6 lb

## 2020-05-09 DIAGNOSIS — Z7901 Long term (current) use of anticoagulants: Secondary | ICD-10-CM | POA: Diagnosis not present

## 2020-05-09 DIAGNOSIS — Z4509 Encounter for adjustment and management of other cardiac device: Secondary | ICD-10-CM | POA: Diagnosis not present

## 2020-05-09 DIAGNOSIS — E782 Mixed hyperlipidemia: Secondary | ICD-10-CM

## 2020-05-09 DIAGNOSIS — I4819 Other persistent atrial fibrillation: Secondary | ICD-10-CM | POA: Diagnosis not present

## 2020-05-09 DIAGNOSIS — I4729 Other ventricular tachycardia: Secondary | ICD-10-CM

## 2020-05-09 DIAGNOSIS — I472 Ventricular tachycardia: Secondary | ICD-10-CM

## 2020-05-09 DIAGNOSIS — I10 Essential (primary) hypertension: Secondary | ICD-10-CM

## 2020-05-09 NOTE — Patient Instructions (Signed)
Medication Instructions:  No changes *If you need a refill on your cardiac medications before your next appointment, please call your pharmacy*   Lab Work: None ordered If you have labs (blood work) drawn today and your tests are completely normal, you will receive your results only by: [name] MyChart Message (if you have MyChart) OR . A paper copy in the mail If you have any lab test that is abnormal or we need to change your treatment, we will call you to review the results.   Testing/Procedures: None ordered   Follow-Up: At [facility], you and your health needs are our priority.  As part of our continuing mission to provide you with exceptional heart care, we have created designated Provider Care Teams.  These Care Teams include your primary Cardiologist (physician) and Advanced Practice Providers (APPs -  Physician Assistants and Nurse Practitioners) who all work together to provide you with the care you need, when you need it.  We recommend signing up for the patient portal called "MyChart".  Sign up information is provided on this After Visit Summary.  MyChart is used to connect with patients for Virtual Visits (Telemedicine).  Patients are able to view lab/test results, encounter notes, upcoming appointments, etc.  Non-urgent messages can be sent to your provider as well.   To learn more about what you can do with MyChart, go to [email or web address].    Your next appointment:   Follow up as needed with Dr. Sallyanne Kuster

## 2020-05-09 NOTE — Progress Notes (Signed)
Cardiology Office  Note   Evaluation Performed:  Follow-up visit  Date:  05/09/2020   ID:  Sophia [name], DOB 1940-10-18, MRN [id number]  PCP:  Mosie Lukes, MD  Cardiologist:  Quay Burow, MD  Electrophysiologist:  None   Chief Complaint:  AFib f/u  History of Present Illness:    Sophia [name] is a 79 y.o. female with a history of a single episode of near syncope for which she received a loop recorder (work-up had shown evidence of nonsustained VT and atrial tachycardia prior to her loop recorder implantation).  The loop recorder documented a 3-hour episode of paroxysmal atrial fibrillation with rapid ventricular response, associated with nausea and a sensation of weakness reminiscent of her near syncope.  She has not had recurrent syncope since then.  Her loop recorder has not documented any meaningful arrhythmia since the 10-day episode of atrial fibrillation in May 2021  In fact she is done quite well recently without any cardiovascular complaints. The patient specifically denies any chest pain at rest exertion, dyspnea at rest or with exertion, orthopnea, paroxysmal nocturnal dyspnea, syncope, palpitations, focal neurological deficits, intermittent claudication, lower extremity edema, unexplained weight gain, cough, hemoptysis or wheezing.  The loop recorder site is well-healed and does not bother her.  She has been seeing Dr. Debara Pickett in the lipid clinic.  She has had difficulty tolerating statins but is doing very well on Repatha.  Just that month ago her LDL cholesterol was 84.    She has a history of hyperlipidemia but has not tolerated statins.  She has a family history of premature onset coronary disease in a first-degree female relatives.  Her recent echocardiogram does not show significant structural heart disease.  LVEF is normal. She had a low risk nuclear myocardial perfusion study.  She does not have carotid stenosis on ultrasound studies.  She has had previous  problems with chest pain, but none recently and the work-up so far has been negative.  She has frequent palpitations and complains of a rushing sensation in her ear with each heartbeat.  This is positional.  She is a Restaurant manager, fast food.  She lives with her daughter, son-in-law and grandchild.      Past Medical History:  Diagnosis Date  . Anxiety and depression   . Arthritis    hands, knees, etc  . Bladder prolapse, female, acquired 09/16/2014  . Bowen's disease   . Diverticulitis   . Diverticulosis   . GERD (gastroesophageal reflux disease)   . H/O hematuria    for several years, work only revealed kidney stone on right side  . History of chicken pox   . History of kidney stones   . Hyperlipidemia   . Hypertension   . Leg cramps, sleep related 04/02/2016  . Myalgia 04/25/2017  . Osteopenia 06/26/2014  . Osteoporosis 06/26/2014  . Plantar wart of left foot 10/15/2016  . Right shoulder pain 04/23/2017  . SCC (squamous cell carcinoma)    arms  . Statin intolerance 09/11/2019   Past Surgical History:  Procedure Laterality Date  . CHOLECYSTECTOMY  2005  . LOOP RECORDER INSERTION N/A 04/15/2018   Procedure: LOOP RECORDER INSERTION;  Surgeon: Sanda Klein, MD;  Location: [place] [facility];  Service: Cardiovascular;  Laterality: N/A;  . SKIN BIOPSY     multiple     No outpatient medications have been marked as taking for the 05/09/20 encounter (Office Visit) with Sanda Klein, MD.     Allergies:   Morphine  and related, Statins, and Sulfa antibiotics   Social History   Tobacco Use  . Smoking status: Never Smoker  . Smokeless tobacco: Never Used  Vaping Use  . Vaping Use: Never used  Substance Use Topics  . Alcohol use: No    Alcohol/week: 0.0 standard drinks  . Drug use: No     Family Hx: The patient's family history includes Birth defects in her maternal uncle; Cancer in her father, mother, and paternal grandfather; Early death in her daughter; Heart attack in  her mother; Heart disease in her brother, maternal grandfather, and mother; Hyperlipidemia in her brother and mother; Hypertension in her brother, daughter, father, and mother; Stroke in her brother; Tuberculosis in her paternal grandmother.  ROS:   Please see the history of present illness.   All other systems are reviewed and are negative.   Prior CV studies:   The following studies were reviewed today: Loop recorder downloads May through October  Labs/Other Tests and Data Reviewed:    EKG: Not ordered today Recent Labs: 12/14/2019: Magnesium 2.0 04/02/2020: TSH 0.71 04/15/2020: ALT 14; BUN 10; Creat 0.82; Hemoglobin 13.1; Platelets 152; Potassium 4.6; Sodium 135   Recent Lipid Panel Lab Results  Component Value Date/Time   CHOL 175 04/02/2020 12:45 PM   TRIG 258 (H) 04/02/2020 12:45 PM   HDL 57 04/02/2020 12:45 PM   CHOLHDL 3.1 04/02/2020 12:45 PM   LDLCALC 84 04/02/2020 12:45 PM   LDLDIRECT 137.0 12/25/2019 01:31 PM     Wt Readings from Last 3 Encounters:  05/09/20 139 lb 9.6 oz (63.3 kg)  04/02/20 138 lb 6.4 oz (62.8 kg)  03/26/20 139 lb 6.4 oz (63.2 kg)     Objective:    Vital Signs:  Ht 5\' 1"  (1.549 m)   Wt 139 lb 9.6 oz (63.3 kg)   BMI 26.38 kg/m     General: Alert, oriented x3, no distress, mildly overweight.  Healthy loop recorder implantation site Head: no evidence of trauma, PERRL, EOMI, no exophtalmos or lid lag, no myxedema, no xanthelasma; normal ears, nose and oropharynx Neck: normal jugular venous pulsations and no hepatojugular reflux; brisk carotid pulses without delay and no carotid bruits Chest: clear to auscultation, no signs of consolidation by percussion or palpation, normal fremitus, symmetrical and full respiratory excursions Cardiovascular: normal position and quality of the apical impulse, regular rhythm, normal first and second heart sounds, no murmurs, rubs or gallops Abdomen: no tenderness or distention, no masses by palpation, no abnormal  pulsatility or arterial bruits, normal bowel sounds, no hepatosplenomegaly Extremities: no clubbing, cyanosis or edema; 2+ radial, ulnar and brachial pulses bilaterally; 2+ right femoral, posterior tibial and dorsalis pedis pulses; 2+ left femoral, posterior tibial and dorsalis pedis pulses; no subclavian or femoral bruits Neurological: grossly nonfocal Psych: Normal mood and affect    ASSESSMENT & PLAN:    1. Persistent atrial fibrillation ([place])   2. Long term (current) use of anticoagulants   3. NSVT (nonsustained ventricular tachycardia) ([place])   4. Encounter for loop recorder check   5. Essential hypertension   6. Mixed hyperlipidemia      1. AFib: Had a protracted episode of persistent atrial fibrillation in May, but no events since.  Presented with a sensation of near syncope and palpitations.  No recent recurrence clinically or documented by the loop recorder.  She is on appropriate anticoagulation. CHADSVasc 4 (age, gender, HTN). 2. Eliquis: Denies falls, injuries or bleeding problems..  She is a Restaurant manager, fast food. 3. NSVT: Previously  detected event monitoring: None has been seen since loop recorder implantation. 4. ILR: Normal device function, continue monthly downloads. 5. HTN: Fair control. 6. HLP: Markedly improved LDL cholesterol.  Continues to have mild hypertriglyceridemia.  She will continue follow-up with Dr. Gwenlyn Found and was seeing Dr. Debara Pickett in the lipid clinic.  She does not need to see me in person but I will continue to monitor her loop recorder remotely.   COVID-19 Education: The signs and symptoms of COVID-19 were discussed with the patient and how to seek care for testing (follow up with PCP or arrange E-visit).  The importance of social distancing was discussed today.  Time:   Today, I have spent 22 minutes with the patient with telehealth technology discussing the above problems.     Medication Adjustments/Labs and Tests Ordered: Current medicines are  reviewed at length with the patient today.  Concerns regarding medicines are outlined above.   Tests Ordered: No orders of the defined types were placed in this encounter.   Medication Changes: No orders of the defined types were placed in this encounter.   Disposition:  Follow up 12 months  Signed, Sanda Klein, MD  05/09/2020 2:46 PM    [place]

## 2020-05-10 ENCOUNTER — Encounter: Payer: Self-pay | Admitting: Cardiovascular Disease

## 2020-05-16 ENCOUNTER — Ambulatory Visit: Payer: Medicare HMO | Admitting: Internal Medicine

## 2020-05-20 ENCOUNTER — Ambulatory Visit (INDEPENDENT_AMBULATORY_CARE_PROVIDER_SITE_OTHER): Payer: Medicare HMO

## 2020-05-20 DIAGNOSIS — R55 Syncope and collapse: Secondary | ICD-10-CM

## 2020-05-20 LAB — CUP PACEART REMOTE DEVICE CHECK
Date Time Interrogation Session: 20211121233632
Implantable Pulse Generator Implant Date: 20191018

## 2020-05-22 NOTE — Progress Notes (Signed)
Carelink Summary Report / Loop Recorder 

## 2020-06-23 LAB — CUP PACEART REMOTE DEVICE CHECK
Date Time Interrogation Session: 20211222235928
Implantable Pulse Generator Implant Date: 20191018

## 2020-06-24 ENCOUNTER — Ambulatory Visit (INDEPENDENT_AMBULATORY_CARE_PROVIDER_SITE_OTHER): Payer: Medicare HMO

## 2020-06-24 DIAGNOSIS — R55 Syncope and collapse: Secondary | ICD-10-CM

## 2020-07-03 DIAGNOSIS — D225 Melanocytic nevi of trunk: Secondary | ICD-10-CM | POA: Diagnosis not present

## 2020-07-03 DIAGNOSIS — D485 Neoplasm of uncertain behavior of skin: Secondary | ICD-10-CM | POA: Diagnosis not present

## 2020-07-03 DIAGNOSIS — D1801 Hemangioma of skin and subcutaneous tissue: Secondary | ICD-10-CM | POA: Diagnosis not present

## 2020-07-03 DIAGNOSIS — L814 Other melanin hyperpigmentation: Secondary | ICD-10-CM | POA: Diagnosis not present

## 2020-07-03 DIAGNOSIS — L821 Other seborrheic keratosis: Secondary | ICD-10-CM | POA: Diagnosis not present

## 2020-07-03 DIAGNOSIS — Z85828 Personal history of other malignant neoplasm of skin: Secondary | ICD-10-CM | POA: Diagnosis not present

## 2020-07-03 DIAGNOSIS — L570 Actinic keratosis: Secondary | ICD-10-CM | POA: Diagnosis not present

## 2020-07-03 DIAGNOSIS — D2272 Melanocytic nevi of left lower limb, including hip: Secondary | ICD-10-CM | POA: Diagnosis not present

## 2020-07-03 DIAGNOSIS — D2371 Other benign neoplasm of skin of right lower limb, including hip: Secondary | ICD-10-CM | POA: Diagnosis not present

## 2020-07-03 DIAGNOSIS — D692 Other nonthrombocytopenic purpura: Secondary | ICD-10-CM | POA: Diagnosis not present

## 2020-07-08 NOTE — Progress Notes (Signed)
Carelink Summary Report / Loop Recorder 

## 2020-07-15 ENCOUNTER — Other Ambulatory Visit: Payer: Self-pay | Admitting: *Deleted

## 2020-07-15 ENCOUNTER — Telehealth: Payer: Self-pay | Admitting: Cardiovascular Disease

## 2020-07-15 DIAGNOSIS — E782 Mixed hyperlipidemia: Secondary | ICD-10-CM

## 2020-07-15 NOTE — Telephone Encounter (Signed)
Spoke with patient to reschedule her 07/16/20 appt with Dr. Gardiner Ramus to 08/30/20 at 10:45 am---patient voiced her understanding.

## 2020-07-16 ENCOUNTER — Telehealth: Payer: Medicare HMO | Admitting: Cardiovascular Disease

## 2020-07-22 DIAGNOSIS — E782 Mixed hyperlipidemia: Secondary | ICD-10-CM | POA: Diagnosis not present

## 2020-07-22 LAB — LIPID PANEL
Chol/HDL Ratio: 2.6 ratio (ref 0.0–4.4)
Cholesterol, Total: 143 mg/dL (ref 100–199)
HDL: 54 mg/dL (ref >39)
LDL Chol Calc (NIH): 61 mg/dL (ref 0–99)
Triglycerides: 171 mg/dL — ABNORMAL HIGH (ref 0–149)
VLDL Cholesterol Cal: 28 mg/dL (ref 5–40)

## 2020-07-23 ENCOUNTER — Telehealth: Payer: Self-pay | Admitting: Internal Medicine

## 2020-07-23 NOTE — Telephone Encounter (Signed)
PA for repatha sureclick submitted via CMM (Key: [id number]) PA Case: [id number], Status: Approved, Coverage Starts on: 06/29/2020 12:00:00 AM, Coverage Ends on: 06/28/2021 12:00:00 AM

## 2020-07-24 ENCOUNTER — Ambulatory Visit: Payer: Medicare HMO | Admitting: Internal Medicine

## 2020-07-24 ENCOUNTER — Other Ambulatory Visit: Payer: Self-pay

## 2020-07-24 ENCOUNTER — Encounter: Payer: Self-pay | Admitting: Internal Medicine

## 2020-07-24 VITALS — BP 142/100 | HR 78 | Ht 60.0 in | Wt 138.0 lb

## 2020-07-24 DIAGNOSIS — M791 Myalgia, unspecified site: Secondary | ICD-10-CM

## 2020-07-24 DIAGNOSIS — T466X5A Adverse effect of antihyperlipidemic and antiarteriosclerotic drugs, initial encounter: Secondary | ICD-10-CM

## 2020-07-24 DIAGNOSIS — I480 Paroxysmal atrial fibrillation: Secondary | ICD-10-CM

## 2020-07-24 DIAGNOSIS — T466X5D Adverse effect of antihyperlipidemic and antiarteriosclerotic drugs, subsequent encounter: Secondary | ICD-10-CM | POA: Diagnosis not present

## 2020-07-24 DIAGNOSIS — I10 Essential (primary) hypertension: Secondary | ICD-10-CM

## 2020-07-24 DIAGNOSIS — E7800 Pure hypercholesterolemia, unspecified: Secondary | ICD-10-CM

## 2020-07-24 MED ORDER — DILTIAZEM HCL ER COATED BEADS 240 MG PO CP24: 240.0000 mg | ORAL_CAPSULE | Freq: Every day | ORAL | 3 refills | Status: DC

## 2020-07-24 NOTE — Patient Instructions (Signed)
Medication Instructions:  INCREASE DILTIAZEM TO 240mg  (1 TABLET) DAILY- A NEW PRESCRIPTION HAS BEEN SENT IN FOR THE 240mg  TABLETS.  *If you need a refill on your cardiac medications before your next appointment, please call your pharmacy*  Follow-Up: At [facility], you and your health needs are our priority.  As part of our continuing mission to provide you with exceptional heart care, we have created designated Provider Care Teams.  These Care Teams include your primary Cardiologist (physician) and Advanced Practice Providers (APPs -  Physician Assistants and Nurse Practitioners) who all work together to provide you with the care you need, when you need it.  Your next appointment:   1 year(s)  LIPID  The format for your next appointment:   In Person  Provider:   K. Mali Hilty, MD   PLEASE MONITOR YOUR BLOOD PRESSURE DAILY AND CONTACT us WITH ANY ISSUES.

## 2020-07-24 NOTE — Progress Notes (Signed)
LIPID CLINIC CONSULT NOTE  Chief Complaint:  Follow-up dyslipidemia  Primary Care Physician: Mosie Lukes, MD  Primary Cardiologist:  Quay Burow, MD  HPI:  Sophia [name] is a 80 y.o. female who is being seen today for the evaluation of dyslipidemia at the request of Mosie Lukes, MD. This is a 80 year old female with a history of dyslipidemia and atrial fibrillation on Eliquis who has been to statins including having had myalgias and muscle weakness on atorvastatin right, red yeast rice, rosuvastatin and pravastatin in the past.  Although there are cardiovascular risk factors and family history of heart disease, she has no known coronary disease.  She was noted to have aortic atherosclerosis on a recent CT renal stone study performed in June 2021.  I suspect there is also coronary artery disease as well.  07/24/2020  Sophia [name] returns today for follow-up.  I last saw her via virtual visit.  We had arranged for her to start on Repatha.  She has been using that with no significant side effects.  Please report her lipids are markedly improved.  Her total cholesterol now is under 43 with HDL 54, LDL 61 and triglycerides 171.  A1c is normal at 5.7.  She did mention today that recently her blood pressure has been going up.  In the office today her blood pressure was 142/100.  I reviewed some home blood pressure readings which indicate systolics between 010 and 272 with diastolics in the 53G and 64Q.  Heart rate is generally in the 60s but was elevated at 78 today.  She had an appointment with Dr. Alvester Chou to review this however it was unfortunately canceled and rescheduled in March.  PMHx:  Past Medical History:  Diagnosis Date  . Anxiety and depression   . Arthritis    hands, knees, etc  . Bladder prolapse, female, acquired 09/16/2014  . Bowen's disease   . Diverticulitis   . Diverticulosis   . GERD (gastroesophageal reflux disease)   . H/O hematuria    for several years,  work only revealed kidney stone on right side  . History of chicken pox   . History of kidney stones   . Hyperlipidemia   . Hypertension   . Leg cramps, sleep related 04/02/2016  . Myalgia 04/25/2017  . Osteopenia 06/26/2014  . Osteoporosis 06/26/2014  . Plantar wart of left foot 10/15/2016  . Right shoulder pain 04/23/2017  . SCC (squamous cell carcinoma)    arms  . Statin intolerance 09/11/2019    Past Surgical History:  Procedure Laterality Date  . CHOLECYSTECTOMY  2005  . LOOP RECORDER INSERTION N/A 04/15/2018   Procedure: LOOP RECORDER INSERTION;  Surgeon: Sanda Klein, MD;  Location: [facility] [facility];  Service: Cardiovascular;  Laterality: N/A;  . SKIN BIOPSY     multiple    FAMHx:  Family History  Problem Relation Age of Onset  . Heart disease Mother        aortic stenosis  . Hyperlipidemia Mother   . Hypertension Mother   . Heart attack Mother   . Cancer Mother        BCC, SCC, skin  . Hypertension Father   . Cancer Father        SCC, BCC, skin  . Stroke Brother   . Early death Daughter   . Heart disease Maternal Grandfather        MI  . Tuberculosis Paternal Grandmother   . Cancer Paternal Grandfather  head and neck cancer, chewed tobacco  . Hypertension Daughter   . Heart disease Brother        arrythmia  . Hypertension Brother   . Hyperlipidemia Brother   . Birth defects Maternal Uncle     SOCHx:   reports that she has never smoked. She has never used smokeless tobacco. She reports that she does not drink alcohol and does not use drugs.  ALLERGIES:  Allergies  Allergen Reactions  . Morphine And Related Swelling  . Statins Other (See Comments)    Myalgias, weakness (atorvastatin, RYR, rosuvastatin, pravastatin)  . Sulfa Antibiotics Swelling and Rash    ROS: Pertinent items noted in HPI and remainder of comprehensive ROS otherwise negative.  HOME MEDS: Current Outpatient Medications on File Prior to Visit  Medication Sig Dispense  Refill  . acetaminophen (TYLENOL) 500 MG tablet Take 1,000 mg by mouth 2 (two) times daily as needed for moderate pain or headache.    . Calcium Carb-Cholecalciferol (CALCIUM 600 + D PO) Take 1 tablet by mouth daily.    . carvedilol (COREG) 25 MG tablet TAKE 1 TABLET(25 MG) BY MOUTH TWICE DAILY WITH A MEAL 180 tablet 1  . Cholecalciferol (VITAMIN D) 50 MCG (2000 UT) tablet Take 1 tablet (2,000 Units total) by mouth daily.    [name] ELIQUIS 5 MG TABS tablet TAKE 1 TABLET(5 MG) BY MOUTH TWICE DAILY 180 tablet 1  . estradiol (ESTRACE) 0.1 MG/GM vaginal cream Place 1 Applicatorful vaginally 2 (two) times a week.    . Evolocumab (REPATHA SURECLICK) XX123456 MG/ML SOAJ Inject 1 Dose into the skin every 14 (fourteen) days. 2.1 mL 11  . famotidine (PEPCID) 20 MG tablet Take 1 tablet (20 mg total) by mouth 2 (two) times daily as needed for heartburn or indigestion. (Patient taking differently: Take 20 mg by mouth 2 (two) times daily as needed for heartburn or indigestion. Pt takes before bed daily) 180 tablet 3  . Hypromellose (ARTIFICIAL TEARS OP) Place 1 drop into both eyes 2 (two) times daily.    . ondansetron (ZOFRAN) 4 MG tablet Take 1 tablet (4 mg total) by mouth every 8 (eight) hours as needed for nausea or vomiting. 20 tablet 0  . predniSONE (DELTASONE) 10 MG tablet TAKE 3 TABLETS PO QD FOR 3 DAYS THEN TAKE 2 TABLETS PO QD FOR 3 DAYS THEN TAKE 1 TABLET PO QD FOR 3 DAYS THEN TAKE 1/2 TAB PO QD FOR 3 DAYS 20 tablet 0  . Probiotic Product (PROBIOTIC DAILY PO) Take 1 capsule by mouth daily.     [name] tiZANidine (ZANAFLEX) 2 MG tablet Take 0.5-2 tablets (1-4 mg total) by mouth at bedtime as needed for muscle spasms. 30 tablet 0   No current facility-administered medications on file prior to visit.    LABS/IMAGING: No results found for this or any previous visit (from the past 48 hour(s)). No results found.  LIPID PANEL:    Component Value Date/Time   CHOL 143 07/22/2020 0909   TRIG 171 (H) 07/22/2020 0909    HDL 54 07/22/2020 0909   CHOLHDL 2.6 07/22/2020 0909   CHOLHDL 3.1 04/02/2020 1245   VLDL 53.8 (H) 12/25/2019 1331   LDLCALC 61 07/22/2020 0909   LDLCALC 84 04/02/2020 1245   LDLDIRECT 137.0 12/25/2019 1331    WEIGHTS: Wt Readings from Last 3 Encounters:  07/24/20 138 lb (62.6 kg)  05/09/20 139 lb 9.6 oz (63.3 kg)  04/02/20 138 lb 6.4 oz (62.8 kg)    VITALS: BP [name])  142/100 (BP Location: Left Arm, Patient Position: Sitting, Cuff Size: Normal)   Pulse 78   Ht 5' (1.524 m)   Wt 138 lb (62.6 kg)   BMI 26.95 kg/m   EXAM: Deferred  EKG: Deferred  ASSESSMENT: 1. Mixed dyslipidemia 2. Statin intolerant-myalgias 3. Aortic atherosclerosis 4. Family history of coronary disease 5. Uncontrolled hypertension 6. Afib  PLAN: 1.   Ms. Noland is doing well on Repatha with marked improvement in her lipids.  She was found to have some A. fib.  She is on diltiazem and metoprolol but recently her blood pressure has been elevated.  She was scheduled to see Dr. Gwenlyn Found but does not have an appointment till March.  I advised her to increase her diltiazem from 120 to 240 mg CD daily.  She can take 2 capsules and will increase her dose.  I encouraged her to monitor blood pressure and heart rate and contact us if there are low, particularly heart rate less than 50 or blood pressure less than 90/50.  Otherwise she should continue to track her blood pressures and follow-up with Dr. Alvester Chou in March.  We will plan to see her annually for her lipids or sooner as necessary.  Pixie Casino, MD, [facility], [place] Director of the Advanced Lipid Disorders &  Cardiovascular Risk Reduction Clinic Diplomate of the American Board of Clinical Lipidology Attending Cardiologist  Direct Dial: [phone]  Fax: [phone]  Website:  [email or web address] 07/24/2020, 4:44 PM

## 2020-07-27 ENCOUNTER — Other Ambulatory Visit: Payer: Self-pay | Admitting: Cardiovascular Disease

## 2020-07-29 ENCOUNTER — Ambulatory Visit (INDEPENDENT_AMBULATORY_CARE_PROVIDER_SITE_OTHER): Payer: Medicare HMO

## 2020-07-29 DIAGNOSIS — R55 Syncope and collapse: Secondary | ICD-10-CM | POA: Diagnosis not present

## 2020-07-29 LAB — CUP PACEART REMOTE DEVICE CHECK
Date Time Interrogation Session: 20220129231320
Implantable Pulse Generator Implant Date: 20191018

## 2020-07-31 ENCOUNTER — Other Ambulatory Visit: Payer: Self-pay | Admitting: Cardiovascular Disease

## 2020-08-06 NOTE — Progress Notes (Signed)
Carelink Summary Report / Loop Recorder 

## 2020-08-08 ENCOUNTER — Ambulatory Visit: Payer: Medicare HMO | Admitting: Family Medicine

## 2020-08-30 ENCOUNTER — Ambulatory Visit: Payer: Medicare HMO | Admitting: Cardiovascular Disease

## 2020-08-30 ENCOUNTER — Encounter: Payer: Self-pay | Admitting: Cardiovascular Disease

## 2020-08-30 ENCOUNTER — Other Ambulatory Visit: Payer: Self-pay

## 2020-08-30 DIAGNOSIS — I480 Paroxysmal atrial fibrillation: Secondary | ICD-10-CM

## 2020-08-30 DIAGNOSIS — E782 Mixed hyperlipidemia: Secondary | ICD-10-CM

## 2020-08-30 DIAGNOSIS — I10 Essential (primary) hypertension: Secondary | ICD-10-CM

## 2020-08-30 NOTE — Progress Notes (Signed)
08/30/2020 Sophia [name]   08-19-1940  [id number]  Primary Physician Mosie Lukes, MD Primary Cardiologist: Lorretta Harp MD FACP, [place], [place], [place]  HPI:  Sophia [name] is a 80 y.o.  mild to moderately overweight divorced Caucasian female mother of 3 children, grandmother of 5 grandchildren who I last saw in the office 01/02/2020. She was referred by the ER for evaluation of chest pain. Her primary care provider is Dr. [name] . Risk factors include hyperlipidemia intolerant to statin therapy and family history with mother who had a myocardial infarction at age 41 as well as extensive vascular disease. 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 She is never had a heart attack or stroke. She has had palpitations evaluated by cardiologist in [place] 7 years ago which time a work-up included stress testing and event monitoring. She was placed on carvedilol at that time. She was seen in the emergency room at [facility] on 02/22/2018 with chest pain and palpitations. Pain began in the early morning hours and got worse. She thought it was indigestion. She did feel increasing palpitations as well as shortness of breath. She says she can also hear her "heartbeat" in her left ear. Her troponins were negative and her EKG showed no acute changes at that time.  When I saw her in the office on 11/08/2019 she was in A. fib with a ventricular response of 106.  I did add Cardizem CD 120 mg a day.  She was already on Eliquis.  I referred her to the A. fib clinic who saw her on 11/13/2019.  She had converted spontaneously.    Since I saw her 9 months ago her major complaint is of left neck shoulder and upper extremity discomfort which is somewhat positional.  It sounds neurogenic to me.  I suspect she has a cervical radiculopathy.  She did see Dr. Debara Pickett for her lipids who noted that her blood pressure was elevated and doubled her  Cardizem CD from 122.240 mg a day.  Her blood pressure measurements at home have been well controlled.  She denies chest pain or shortness of breath.  She is on Eliquis for PAF maintaining sinus rhythm.  She has had a loop recorder implanted and has had no recurrent episodes of A. fib.  Current Meds  Medication Sig  . acetaminophen (TYLENOL) 500 MG tablet Take 1,000 mg by mouth 2 (two) times daily as needed for moderate pain or headache.  . Calcium Carb-Cholecalciferol (CALCIUM 600 + D PO) Take 1 tablet by mouth daily.  . carvedilol (COREG) 25 MG tablet TAKE 1 TABLET(25 MG) BY MOUTH TWICE DAILY WITH A MEAL  . Cholecalciferol (VITAMIN D) 50 MCG (2000 UT) tablet Take 1 tablet (2,000 Units total) by mouth daily.  [name] Co-Enzyme Q10 200 MG CAPS Take 200 mg by mouth daily.  [name] diltiazem (CARDIZEM CD) 240 MG 24 hr capsule Take 1 capsule (240 mg total) by mouth daily.  [name] ELIQUIS 5 MG TABS tablet TAKE 1 TABLET(5 MG) BY MOUTH TWICE DAILY  . estradiol (ESTRACE) 0.1 MG/GM vaginal cream Place 1 Applicatorful vaginally 2 (two) times a week.  . Evolocumab (REPATHA SURECLICK) 147 MG/ML SOAJ Inject 1 Dose into the skin every 14 (fourteen) days.  . famotidine (PEPCID) 20 MG tablet Take 1 tablet (20 mg total) by mouth 2 (two) times daily as needed for heartburn or indigestion. (Patient taking differently: Take 20 mg by  mouth 2 (two) times daily as needed for heartburn or indigestion. Pt takes before bed daily)  . Hypromellose (ARTIFICIAL TEARS OP) Place 1 drop into both eyes 2 (two) times daily.  . Probiotic Product (PROBIOTIC DAILY PO) Take 1 capsule by mouth daily.      Allergies  Allergen Reactions  . Morphine And Related Swelling  . Statins Other (See Comments)    Myalgias, weakness (atorvastatin, RYR, rosuvastatin, pravastatin)  . Sulfa Antibiotics Swelling and Rash    Social History   Socioeconomic History  . Marital status: Single    Spouse name: Not on file  . Number of children: Not on file  .  Years of education: Not on file  . Highest education level: Not on file  Occupational History  . Not on file  Tobacco Use  . Smoking status: Never Smoker  . Smokeless tobacco: Never Used  Vaping Use  . Vaping Use: Never used  Substance and Sexual Activity  . Alcohol use: No    Alcohol/week: 0.0 standard drinks  . Drug use: No  . Sexual activity: Never    Comment: divorced, widowed, lives with elderly parents, is the caregiver, no dietary restrictions.   Other Topics Concern  . Not on file  Social History Narrative  . Not on file   Social Determinants of Health   Financial Resource Strain: Not on file  Food Insecurity: Not on file  Transportation Needs: Not on file  Physical Activity: Not on file  Stress: Not on file  Social Connections: Not on file  Intimate Partner Violence: Not on file     Review of Systems: General: negative for chills, fever, night sweats or weight changes.  Cardiovascular: negative for chest pain, dyspnea on exertion, edema, orthopnea, palpitations, paroxysmal nocturnal dyspnea or shortness of breath Dermatological: negative for rash Respiratory: negative for cough or wheezing Urologic: negative for hematuria Abdominal: negative for nausea, vomiting, diarrhea, bright red blood per rectum, melena, or hematemesis Neurologic: negative for visual changes, syncope, or dizziness All other systems reviewed and are otherwise negative except as noted above.    Blood pressure (!) 160/90, pulse 60, height 5\' 1"  (1.549 m), weight 139 lb 9.6 oz (63.3 kg), SpO2 96 %.  General appearance: alert and no distress Neck: no adenopathy, no carotid bruit, no JVD, supple, symmetrical, trachea midline and thyroid not enlarged, symmetric, no tenderness/mass/nodules Lungs: clear to auscultation bilaterally Heart: regular rate and rhythm, S1, S2 normal, no murmur, click, rub or gallop Extremities: extremities normal, atraumatic, no cyanosis or edema Pulses: 2+ and  symmetric Skin: Skin color, texture, turgor normal. No rashes or lesions Neurologic: Alert and oriented X 3, normal strength and tone. Normal symmetric reflexes. Normal coordination and gait  EKG sinus rhythm at 60 without ST or T wave changes.  I personally reviewed this EKG.  ASSESSMENT AND PLAN:   Hypertension History of essential hypertension a blood pressure measured today at 160/90.  She is on carvedilol and diltiazem.  She showed me a blood pressure log which she took at home but showed essentially normal blood pressures but this was after Dr. Debara Pickett doubled her diltiazem dose.  She does complain of left neck and shoulder and upper extremity discomfort.  I suspect that her elevated blood pressure is related to this.  Hyperlipidemia History of hyperlipidemia on Repatha.  She sees Dr. Debara Pickett in lipid clinic.  Her most recent lipid profile performed 07/22/2020 revealed total cholesterol 143, LDL 61 and HDL of 54.  Paroxysmal atrial fibrillation (  [place]) History of paroxysmal A. fib currently maintaining sinus rhythm on Eliquis.  This probably was the cause of her near syncopal episode in the past which she has had no recurrence.  She does have a loop recorder implanted followed by Dr. Sallyanne Kuster that has not showed recurrent A. fib.      Lorretta Harp MD [place], [facility] 08/30/2020 12:02 PM

## 2020-08-30 NOTE — Patient Instructions (Signed)

## 2020-08-30 NOTE — Assessment & Plan Note (Signed)
History of paroxysmal A. fib currently maintaining sinus rhythm on Eliquis.  This probably was the cause of her near syncopal episode in the past which she has had no recurrence.  She does have a loop recorder implanted followed by Dr. Sallyanne Kuster that has not showed recurrent A. fib.

## 2020-08-30 NOTE — Assessment & Plan Note (Signed)
History of hyperlipidemia on Repatha.  She sees Dr. Debara Pickett in lipid clinic.  Her most recent lipid profile performed 07/22/2020 revealed total cholesterol 143, LDL 61 and HDL of 54.

## 2020-08-30 NOTE — Assessment & Plan Note (Signed)
History of essential hypertension a blood pressure measured today at 160/90.  She is on carvedilol and diltiazem.  She showed me a blood pressure log which she took at home but showed essentially normal blood pressures but this was after Dr. Debara Pickett doubled her diltiazem dose.  She does complain of left neck and shoulder and upper extremity discomfort.  I suspect that her elevated blood pressure is related to this.

## 2020-09-02 ENCOUNTER — Ambulatory Visit (INDEPENDENT_AMBULATORY_CARE_PROVIDER_SITE_OTHER): Payer: Medicare HMO

## 2020-09-02 DIAGNOSIS — I480 Paroxysmal atrial fibrillation: Secondary | ICD-10-CM

## 2020-09-04 LAB — CUP PACEART REMOTE DEVICE CHECK
Date Time Interrogation Session: 20220301231800
Implantable Pulse Generator Implant Date: 20191018

## 2020-09-09 DIAGNOSIS — H52203 Unspecified astigmatism, bilateral: Secondary | ICD-10-CM | POA: Diagnosis not present

## 2020-09-09 DIAGNOSIS — H2513 Age-related nuclear cataract, bilateral: Secondary | ICD-10-CM | POA: Diagnosis not present

## 2020-09-09 DIAGNOSIS — H5203 Hypermetropia, bilateral: Secondary | ICD-10-CM | POA: Diagnosis not present

## 2020-09-09 DIAGNOSIS — H04123 Dry eye syndrome of bilateral lacrimal glands: Secondary | ICD-10-CM | POA: Diagnosis not present

## 2020-09-10 NOTE — Progress Notes (Signed)
Carelink Summary Report / Loop Recorder 

## 2020-09-12 ENCOUNTER — Encounter: Payer: Self-pay | Admitting: Family Medicine

## 2020-09-12 ENCOUNTER — Ambulatory Visit (INDEPENDENT_AMBULATORY_CARE_PROVIDER_SITE_OTHER): Payer: Medicare HMO | Admitting: Family Medicine

## 2020-09-12 ENCOUNTER — Ambulatory Visit (HOSPITAL_BASED_OUTPATIENT_CLINIC_OR_DEPARTMENT_OTHER)
Admission: RE | Admit: 2020-09-12 | Discharge: 2020-09-12 | Disposition: A | Discharge status: Home / Self Care | Payer: Medicare HMO | Source: Ambulatory Visit | Attending: Family Medicine | Admitting: Family Medicine

## 2020-09-12 ENCOUNTER — Other Ambulatory Visit: Payer: Self-pay

## 2020-09-12 VITALS — BP 118/70 | HR 63 | Temp 98.4°F | Resp 15 | Ht 61.0 in | Wt 140.0 lb

## 2020-09-12 DIAGNOSIS — R739 Hyperglycemia, unspecified: Secondary | ICD-10-CM

## 2020-09-12 DIAGNOSIS — E782 Mixed hyperlipidemia: Secondary | ICD-10-CM

## 2020-09-12 DIAGNOSIS — M25512 Pain in left shoulder: Secondary | ICD-10-CM

## 2020-09-12 DIAGNOSIS — E559 Vitamin D deficiency, unspecified: Secondary | ICD-10-CM | POA: Diagnosis not present

## 2020-09-12 DIAGNOSIS — M542 Cervicalgia: Secondary | ICD-10-CM | POA: Insufficient documentation

## 2020-09-12 DIAGNOSIS — M47812 Spondylosis without myelopathy or radiculopathy, cervical region: Secondary | ICD-10-CM | POA: Diagnosis not present

## 2020-09-12 DIAGNOSIS — K219 Gastro-esophageal reflux disease without esophagitis: Secondary | ICD-10-CM | POA: Diagnosis not present

## 2020-09-12 DIAGNOSIS — I10 Essential (primary) hypertension: Secondary | ICD-10-CM

## 2020-09-12 MED ORDER — TIZANIDINE HCL 2 MG PO TABS: 1.0000 mg | ORAL_TABLET | Freq: Every evening | ORAL | 0 refills | Status: DC | PRN

## 2020-09-12 NOTE — Patient Instructions (Signed)

## 2020-09-12 NOTE — Progress Notes (Signed)
Patient ID: Sophia [name], female    DOB: 1941-02-06  Age: 80 y.o. MRN: [id number]    Subjective:  Subjective  HPI [name] presents for office visit today. In her last visit she had concerns of pain behind her left ear that developed in gland swelling, left neck and shoulder pain. She notes that the pain levels her sporadic and keeps her awake some nights. It makes it hard for her to rotate her left shoulder some days. She went to her Cardiologist to rule out if the issue was heart related and it has nothing to with any heart issues. She was dx with cervical(C5-C6) and lumbar DDD. She also relates her symptoms from overextending her left arm while putting on her bra and overuse. Her daughter recently broke her leg from a hiking trip which is causing her to move around more than usual. She states that the pain worsens when lying down. She states that extraneous physical activity does not change her levels of pain. She has been taking Tylenol and applying magnesium topical cream and states that it has helped some but then returns. No improvement with new pillows, hot and cold compresses. She denies any trauma and injury. Denies any chest pain, SOB, fever, abdominal pain, cough, chills, sore throat, dysuria, urinary incontinence, LE pain, HA, or N/VD.   She reported that her BP is under control with the medication, but she expressed concern about taking too many medications.   BP Readings from Last 3 Encounters:  09/12/20 118/70  08/30/20 (!) 160/90  07/24/20 (!) 142/100   She also expressed her concern regarding her bad reaction to the booster vaccine of COVID-19. She states having a adverse reaction(arm swelling).  Review of Systems  Constitutional: Negative for chills, fatigue and fever.  HENT: Positive for ear pain (left). Negative for congestion, rhinorrhea, sinus pressure, sinus pain and sore throat.   Eyes: Negative for pain.  Respiratory: Negative for shortness of breath.    Cardiovascular: Negative for chest pain, palpitations and leg swelling.  Gastrointestinal: Negative for abdominal pain, blood in stool, diarrhea, nausea and vomiting.  Genitourinary: Negative for dysuria, flank pain, frequency, vaginal bleeding, vaginal discharge and vaginal pain.  Musculoskeletal: Positive for back pain (left cervical and lumbar) and neck pain (left). Negative for myalgias.  Skin: Negative for rash.  Neurological: Negative for dizziness and headaches.    History Past Medical History:  Diagnosis Date  . Anxiety and depression   . Arthritis    hands, knees, etc  . Bladder prolapse, female, acquired 09/16/2014  . Bowen's disease   . Diverticulitis   . Diverticulosis   . GERD (gastroesophageal reflux disease)   . H/O hematuria    for several years, work only revealed kidney stone on right side  . History of chicken pox   . History of kidney stones   . Hyperlipidemia   . Hypertension   . Leg cramps, sleep related 04/02/2016  . Myalgia 04/25/2017  . Osteopenia 06/26/2014  . Osteoporosis 06/26/2014  . Plantar wart of left foot 10/15/2016  . Right shoulder pain 04/23/2017  . SCC (squamous cell carcinoma)    arms  . Statin intolerance 09/11/2019    She has a past surgical history that includes Cholecystectomy (2005); Skin biopsy; and LOOP RECORDER INSERTION (N/A, 04/15/2018).   Her family history includes Birth defects in her maternal uncle; Cancer in her father, mother, and paternal grandfather; Early death in her daughter; Heart attack in her mother; Heart disease in her  brother, maternal grandfather, and mother; Hyperlipidemia in her brother and mother; Hypertension in her brother, daughter, father, and mother; Stroke in her brother; Tuberculosis in her paternal grandmother.She reports that she has never smoked. She has never used smokeless tobacco. She reports that she does not drink alcohol and does not use drugs.  Current Outpatient Medications on File Prior to  Visit  Medication Sig Dispense Refill  . acetaminophen (TYLENOL) 500 MG tablet Take 1,000 mg by mouth 2 (two) times daily as needed for moderate pain or headache.    . Calcium Carb-Cholecalciferol (CALCIUM 600 + D PO) Take 1 tablet by mouth daily.    . carvedilol (COREG) 25 MG tablet TAKE 1 TABLET(25 MG) BY MOUTH TWICE DAILY WITH A MEAL 180 tablet 1  . Cholecalciferol (VITAMIN D) 50 MCG (2000 UT) tablet Take 1 tablet (2,000 Units total) by mouth daily.    [name] Co-Enzyme Q10 200 MG CAPS Take 200 mg by mouth daily.    [name] diltiazem (CARDIZEM CD) 240 MG 24 hr capsule Take 1 capsule (240 mg total) by mouth daily. 30 capsule 3  . ELIQUIS 5 MG TABS tablet TAKE 1 TABLET(5 MG) BY MOUTH TWICE DAILY 180 tablet 1  . estradiol (ESTRACE) 0.1 MG/GM vaginal cream Place 1 Applicatorful vaginally 2 (two) times a week.    . Evolocumab (REPATHA SURECLICK) 546 MG/ML SOAJ Inject 1 Dose into the skin every 14 (fourteen) days. 2.1 mL 11  . famotidine (PEPCID) 20 MG tablet Take 1 tablet (20 mg total) by mouth 2 (two) times daily as needed for heartburn or indigestion. (Patient taking differently: Take 20 mg by mouth 2 (two) times daily as needed for heartburn or indigestion. Pt takes before bed daily) 180 tablet 3  . Hypromellose (ARTIFICIAL TEARS OP) Place 1 drop into both eyes 2 (two) times daily.    . Probiotic Product (PROBIOTIC DAILY PO) Take 1 capsule by mouth daily.      No current facility-administered medications on file prior to visit.     Objective:  Objective  Physical Exam Constitutional:      General: She is not in acute distress.    Appearance: Normal appearance. She is well-developed. She is not ill-appearing.  HENT:     Head: Normocephalic and atraumatic.     Right Ear: External ear normal.     Left Ear: External ear normal.     Nose: Nose normal.  Eyes:     Extraocular Movements: Extraocular movements intact.     Pupils: Pupils are equal, round, and reactive to light.  Cardiovascular:     Rate  and Rhythm: Normal rate and regular rhythm.     Pulses: Normal pulses.     Heart sounds: Normal heart sounds. No murmur heard.   Pulmonary:     Effort: Pulmonary effort is normal.     Breath sounds: Normal breath sounds.  Abdominal:     General: Bowel sounds are normal.     Palpations: Abdomen is soft. There is no mass.     Tenderness: There is no abdominal tenderness. There is no guarding.  Musculoskeletal:        General: Tenderness present.     Cervical back: Normal range of motion. Tenderness present. No rigidity. Muscular tenderness (left sternocleidomastoid) present.     Thoracic back: Tenderness present.     Lumbar back: Tenderness present.  Skin:    General: Skin is warm and dry.  Neurological:     Mental Status: She is alert and  oriented to person, place, and time.  Psychiatric:        Behavior: Behavior normal.    BP 118/70 (BP Location: Left Arm, Patient Position: Sitting, Cuff Size: Normal)   Pulse 63   Temp 98.4 F (36.9 C)   Resp 15   Ht 5\' 1"  (1.549 m)   Wt 140 lb (63.5 kg)   SpO2 99%   BMI 26.45 kg/m  Wt Readings from Last 3 Encounters:  09/12/20 140 lb (63.5 kg)  08/30/20 139 lb 9.6 oz (63.3 kg)  07/24/20 138 lb (62.6 kg)     Lab Results  Component Value Date   WBC 9.3 04/15/2020   HGB 13.1 04/15/2020   HCT 39.1 04/15/2020   PLT 152 04/15/2020   GLUCOSE 95 04/15/2020   CHOL 143 07/22/2020   TRIG 171 (H) 07/22/2020   HDL 54 07/22/2020   LDLDIRECT 137.0 12/25/2019   LDLCALC 61 07/22/2020   ALT 14 04/15/2020   AST 16 04/15/2020   NA 135 04/15/2020   K 4.6 04/15/2020   CL 100 04/15/2020   CREATININE 0.82 04/15/2020   BUN 10 04/15/2020   CO2 27 04/15/2020   TSH 0.71 04/02/2020   HGBA1C 5.7 (H) 04/02/2020    CT CARDIAC SCORING  Addendum Date: 01/24/2020   ADDENDUM REPORT: 01/24/2020 20:37 CLINICAL DATA:  Risk stratification EXAM: Coronary Calcium Score TECHNIQUE: The patient was scanned on a [employer] scanner. Axial non-contrast 3 mm  slices were carried out through the heart. The data set was analyzed on a dedicated work station and scored using the [place]. FINDINGS: Non-cardiac: See separate report from [facility]. Ascending Aorta: Mildly dilated 3.8 cm at the level of the main PA bifurcation. Pericardium: Normal Coronary arteries: Normal origin.  Aortic atherosclerosis. IMPRESSION: Coronary calcium score of 304. This was 76th percentile for age and sex matched control Mildly dilated aorta to 3.8 cm at the level of the main PA bifurcation Aortic atherosclerosis Electronically Signed   By: Pixie Casino M.D.   On: 01/24/2020 20:37   Result Date: 01/24/2020 EXAM: OVER-READ INTERPRETATION  CT CHEST The following report is an over-read performed by radiologist Dr. Vinnie Langton of [facility], [place] on 01/24/2020. This over-read does not include interpretation of cardiac or coronary anatomy or pathology. The coronary calcium score interpretation by the cardiologist is attached. COMPARISON:  None. FINDINGS: Aortic atherosclerosis. Within the visualized portions of the thorax there are no suspicious appearing pulmonary nodules or masses, there is no acute consolidative airspace disease, no pleural effusions, no pneumothorax and no lymphadenopathy. Visualized portions of the upper abdomen demonstrates a 1 cm low-attenuation lesion in segment 2 of the liver, incompletely characterized on today's non-contrast CT examination, but statistically likely to represent a cyst. There are no aggressive appearing lytic or blastic lesions noted in the visualized portions of the skeleton. IMPRESSION: 1.  Aortic Atherosclerosis (ICD10-I70.0). Electronically Signed: By: Vinnie Langton M.D. On: 01/24/2020 13:37     Assessment & Plan:  Plan    Meds ordered this encounter  Medications  . tiZANidine (ZANAFLEX) 2 MG tablet    Sig: Take 0.5-2 tablets (1-4 mg total) by mouth at bedtime as needed for muscle spasms.    Dispense:  10  tablet    Refill:  0    Problem List Items Addressed This Visit    GERD (gastroesophageal reflux disease)    Avoid offending foods, start probiotics. Do not eat large meals in late evening and consider raising head of bed.  Hypertension    Well controlled, no changes to meds. Encouraged heart healthy diet such as the DASH diet and exercise as tolerated.       Hyperlipidemia    Encouraged heart healthy diet, increase exercise, avoid trans fats, consider a krill oil cap daily      Left shoulder pain    Encouraged moist heat and gentle stretching as tolerated. May try NSAIDs and prescription meds as directed and report if symptoms worsen or seek immediate care      Relevant Orders   DG Shoulder Left (Completed)   Hyperglycemia    hgba1c acceptable, minimize simple carbs. Increase exercise as tolerated      Neck pain - Primary    Encouraged moist heat and gentle stretching as tolerated. May try NSAIDs and prescription meds as directed and report if symptoms worsen or seek immediate care, try topical treatments, xray today shohw DDD if pain worsens my require referral, given an rx for Tizanidine to try 1-4 mg qhs prn      Relevant Orders   DG Cervical Spine Complete (Completed)   Vitamin D deficiency    Supplement and monitor         Follow-up: Return in about 4 months (around 01/07/2021).   I,Alexis Bryant,acting as a Education administrator for Penni Homans, MD.,have documented all relevant documentation on the behalf of Penni Homans, MD,as directed by  Penni Homans, MD while in the presence of Penni Homans, MD.  Medical screening examination/treatment was performed by qualified clinical staff member and as supervising physician I was immediately available for consultation/collaboration. I have reviewed documentation and agree with assessment and plan.  Penni Homans, MD

## 2020-09-15 NOTE — Assessment & Plan Note (Signed)
Avoid offending foods, start probiotics. Do not eat large meals in late evening and consider raising head of bed.  

## 2020-09-15 NOTE — Assessment & Plan Note (Signed)
Encouraged moist heat and gentle stretching as tolerated. May try NSAIDs and prescription meds as directed and report if symptoms worsen or seek immediate care, try topical treatments, xray today shohw DDD if pain worsens my require referral, given an rx for Tizanidine to try 1-4 mg qhs prn

## 2020-09-15 NOTE — Assessment & Plan Note (Signed)
Encouraged heart healthy diet, increase exercise, avoid trans fats, consider a krill oil cap daily 

## 2020-09-15 NOTE — Assessment & Plan Note (Signed)
Supplement and monitor 

## 2020-09-15 NOTE — Assessment & Plan Note (Signed)
hgba1c acceptable, minimize simple carbs. Increase exercise as tolerated.  

## 2020-09-15 NOTE — Assessment & Plan Note (Signed)
Well controlled, no changes to meds. Encouraged heart healthy diet such as the DASH diet and exercise as tolerated.  °

## 2020-09-15 NOTE — Assessment & Plan Note (Signed)
Encouraged moist heat and gentle stretching as tolerated. May try NSAIDs and prescription meds as directed and report if symptoms worsen or seek immediate care 

## 2020-10-05 LAB — CUP PACEART REMOTE DEVICE CHECK
Date Time Interrogation Session: 20220402001912
Implantable Pulse Generator Implant Date: 20191018

## 2020-10-07 ENCOUNTER — Ambulatory Visit (INDEPENDENT_AMBULATORY_CARE_PROVIDER_SITE_OTHER): Payer: Medicare HMO

## 2020-10-07 DIAGNOSIS — I480 Paroxysmal atrial fibrillation: Secondary | ICD-10-CM

## 2020-10-21 NOTE — Progress Notes (Signed)
Carelink Summary Report / Loop Recorder 

## 2020-11-11 ENCOUNTER — Ambulatory Visit (INDEPENDENT_AMBULATORY_CARE_PROVIDER_SITE_OTHER): Payer: Medicare HMO

## 2020-11-11 DIAGNOSIS — I480 Paroxysmal atrial fibrillation: Secondary | ICD-10-CM | POA: Diagnosis not present

## 2020-11-12 LAB — CUP PACEART REMOTE DEVICE CHECK
Date Time Interrogation Session: 20220514231202
Implantable Pulse Generator Implant Date: 20191018

## 2020-11-17 ENCOUNTER — Other Ambulatory Visit: Payer: Self-pay | Admitting: Internal Medicine

## 2020-11-17 DIAGNOSIS — I480 Paroxysmal atrial fibrillation: Secondary | ICD-10-CM

## 2020-11-18 NOTE — Telephone Encounter (Signed)
Rx(s) sent to pharmacy electronically.  

## 2020-12-04 NOTE — Progress Notes (Signed)
Carelink Summary Report / Loop Recorder 

## 2020-12-14 ENCOUNTER — Other Ambulatory Visit: Payer: Self-pay | Admitting: Family Medicine

## 2020-12-16 ENCOUNTER — Ambulatory Visit (INDEPENDENT_AMBULATORY_CARE_PROVIDER_SITE_OTHER): Payer: Medicare HMO

## 2020-12-16 DIAGNOSIS — I480 Paroxysmal atrial fibrillation: Secondary | ICD-10-CM | POA: Diagnosis not present

## 2020-12-17 LAB — CUP PACEART REMOTE DEVICE CHECK
Date Time Interrogation Session: 20220614232856
Implantable Pulse Generator Implant Date: 20191018

## 2020-12-20 DIAGNOSIS — Z4689 Encounter for fitting and adjustment of other specified devices: Secondary | ICD-10-CM | POA: Diagnosis not present

## 2020-12-20 DIAGNOSIS — N952 Postmenopausal atrophic vaginitis: Secondary | ICD-10-CM | POA: Diagnosis not present

## 2020-12-20 DIAGNOSIS — N8110 Cystocele, unspecified: Secondary | ICD-10-CM | POA: Diagnosis not present

## 2021-01-03 NOTE — Progress Notes (Signed)
Carelink Summary Report / Loop Recorder 

## 2021-01-14 ENCOUNTER — Other Ambulatory Visit: Payer: Self-pay

## 2021-01-14 ENCOUNTER — Ambulatory Visit (INDEPENDENT_AMBULATORY_CARE_PROVIDER_SITE_OTHER): Payer: Medicare HMO | Admitting: Family Medicine

## 2021-01-14 ENCOUNTER — Encounter: Payer: Self-pay | Admitting: Family Medicine

## 2021-01-14 VITALS — BP 102/64 | HR 64 | Temp 98.1°F | Resp 99 | Wt 139.2 lb

## 2021-01-14 DIAGNOSIS — H93A9 Pulsatile tinnitus, unspecified ear: Secondary | ICD-10-CM

## 2021-01-14 DIAGNOSIS — E782 Mixed hyperlipidemia: Secondary | ICD-10-CM | POA: Diagnosis not present

## 2021-01-14 DIAGNOSIS — Z789 Other specified health status: Secondary | ICD-10-CM | POA: Diagnosis not present

## 2021-01-14 DIAGNOSIS — M542 Cervicalgia: Secondary | ICD-10-CM | POA: Diagnosis not present

## 2021-01-14 DIAGNOSIS — K219 Gastro-esophageal reflux disease without esophagitis: Secondary | ICD-10-CM

## 2021-01-14 DIAGNOSIS — R739 Hyperglycemia, unspecified: Secondary | ICD-10-CM

## 2021-01-14 DIAGNOSIS — I10 Essential (primary) hypertension: Secondary | ICD-10-CM

## 2021-01-14 DIAGNOSIS — R519 Headache, unspecified: Secondary | ICD-10-CM | POA: Insufficient documentation

## 2021-01-14 DIAGNOSIS — R002 Palpitations: Secondary | ICD-10-CM

## 2021-01-14 DIAGNOSIS — E559 Vitamin D deficiency, unspecified: Secondary | ICD-10-CM

## 2021-01-14 DIAGNOSIS — I4819 Other persistent atrial fibrillation: Secondary | ICD-10-CM

## 2021-01-14 LAB — LIPID PANEL
Cholesterol: 144 mg/dL (ref 0–200)
HDL: 49.9 mg/dL (ref >39.00)
LDL Cholesterol: 56 mg/dL (ref 0–99)
NonHDL: 93.76
Total CHOL/HDL Ratio: 3
Triglycerides: 191 mg/dL — ABNORMAL HIGH (ref 0.0–149.0)
VLDL: 38.2 mg/dL (ref 0.0–40.0)

## 2021-01-14 LAB — COMPREHENSIVE METABOLIC PANEL
ALT: 11 U/L (ref 0–35)
AST: 17 U/L (ref 0–37)
Albumin: 4.4 g/dL (ref 3.5–5.2)
Alkaline Phosphatase: 76 U/L (ref 39–117)
BUN: 10 mg/dL (ref 6–23)
CO2: 26 mEq/L (ref 19–32)
Calcium: 9.3 mg/dL (ref 8.4–10.5)
Chloride: 101 mEq/L (ref 96–112)
Creatinine, Ser: 0.84 mg/dL (ref 0.40–1.20)
GFR: 66.46 mL/min (ref >60.00)
Glucose, Bld: 100 mg/dL — ABNORMAL HIGH (ref 70–99)
Potassium: 4.3 mEq/L (ref 3.5–5.1)
Sodium: 136 mEq/L (ref 135–145)
Total Bilirubin: 0.7 mg/dL (ref 0.2–1.2)
Total Protein: 6.6 g/dL (ref 6.0–8.3)

## 2021-01-14 LAB — CBC
HCT: 37.6 % (ref 36.0–46.0)
Hemoglobin: 12.7 g/dL (ref 12.0–15.0)
MCHC: 33.7 g/dL (ref 30.0–36.0)
MCV: 92.9 fl (ref 78.0–100.0)
Platelets: 169 10*3/uL (ref 150.0–400.0)
RBC: 4.05 Mil/uL (ref 3.87–5.11)
RDW: 13.9 % (ref 11.5–15.5)
WBC: 8.3 10*3/uL (ref 4.0–10.5)

## 2021-01-14 LAB — TSH: TSH: 1.78 u[IU]/mL (ref 0.35–5.50)

## 2021-01-14 LAB — VITAMIN D 25 HYDROXY (VIT D DEFICIENCY, FRACTURES): VITD: 36.04 ng/mL (ref 30.00–100.00)

## 2021-01-14 LAB — HEMOGLOBIN A1C: Hgb A1c MFr Bld: 5.9 % (ref 4.6–6.5)

## 2021-01-14 MED ORDER — BACLOFEN 10 MG PO TABS: 5.0000 mg | ORAL_TABLET | Freq: Every evening | ORAL | 1 refills | Status: DC | PRN

## 2021-01-14 NOTE — Assessment & Plan Note (Signed)
Encourage heart healthy diet such as MIND or DASH diet, increase exercise, avoid trans fats, simple carbohydrates and processed foods, consider a krill or fish or flaxseed oil cap daily.  °

## 2021-01-14 NOTE — Assessment & Plan Note (Signed)
hgba1c acceptable, minimize simple carbs. Increase exercise as tolerated.  

## 2021-01-14 NOTE — Patient Instructions (Signed)
Neck Exercises Ask your health care provider which exercises are safe for you. Do exercises exactly as told by your health care provider and adjust them as directed. It is normal to feel mild stretching, pulling, tightness, or discomfort as you do these exercises. Stop right away if you feel sudden pain or your pain gets worse. Do not begin these exercises until told by your health care provider. Neck exercises can be important for many reasons. They can improve strength and maintain flexibility in your neck, which will help your upper back and preventneck pain. Stretching exercises Rotation neck stretching  Sit in a chair or stand up. Place your feet flat on the floor, shoulder width apart. Slowly turn your head (rotate) to the right until a slight stretch is felt. Turn it all the way to the right so you can look over your right shoulder. Do not tilt or tip your head. Hold this position for 10-30 seconds. Slowly turn your head (rotate) to the left until a slight stretch is felt. Turn it all the way to the left so you can look over your left shoulder. Do not tilt or tip your head. Hold this position for 10-30 seconds. Repeat __________ times. Complete this exercise __________ times a day. Neck retraction Sit in a sturdy chair or stand up. Look straight ahead. Do not bend your neck. Use your fingers to push your chin backward (retraction). Do not bend your neck for this movement. Continue to face straight ahead. If you are doing the exercise properly, you will feel a slight sensation in your throat and a stretch at the back of your neck. Hold the stretch for 1-2 seconds. Repeat __________ times. Complete this exercise __________ times a day. Strengthening exercises Neck press Lie on your back on a firm bed or on the floor with a pillow under your head. Use your neck muscles to push your head down on the pillow and straighten your spine. Hold the position as well as you can. Keep your head  facing up (in a neutral position) and your chin tucked. Slowly count to 5 while holding this position. Repeat __________ times. Complete this exercise __________ times a day. Isometrics These are exercises in which you strengthen the muscles in your neck while keeping your neck still (isometrics). Sit in a supportive chair and place your hand on your forehead. Keep your head and face facing straight ahead. Do not flex or extend your neck while doing isometrics. Push forward with your head and neck while pushing back with your hand. Hold for 10 seconds. Do the sequence again, this time putting your hand against the back of your head. Use your head and neck to push backward against the hand pressure. Finally, do the same exercise on either side of your head, pushing sideways against the pressure of your hand. Repeat __________ times. Complete this exercise __________ times a day. Prone head lifts Lie face-down (prone position), resting on your elbows so that your chest and upper back are raised. Start with your head facing downward, near your chest. Position your chin either on or near your chest. Slowly lift your head upward. Lift until you are looking straight ahead. Then continue lifting your head as far back as you can comfortably stretch. Hold your head up for 5 seconds. Then slowly lower it to your starting position. Repeat __________ times. Complete this exercise __________ times a day. Supine head lifts Lie on your back (supine position), bending your knees to point to the   ceiling and keeping your feet flat on the floor. Lift your head slowly off the floor, raising your chin toward your chest. Hold for 5 seconds. Repeat __________ times. Complete this exercise __________ times a day. Scapular retraction Stand with your arms at your sides. Look straight ahead. Slowly pull both shoulders (scapulae) backward and downward (retraction) until you feel a stretch between your shoulder blades in  your upper back. Hold for 10-30 seconds. Relax and repeat. Repeat __________ times. Complete this exercise __________ times a day. Contact a health care provider if: Your neck pain or discomfort gets much worse when you do an exercise. Your neck pain or discomfort does not improve within 2 hours after you exercise. If you have any of these problems, stop exercising right away. Do not do the exercises again unless your health care provider says that you can. Get help right away if: You develop sudden, severe neck pain. If this happens, stop exercising right away. Do not do the exercises again unless your health care provider says that you can. This information is not intended to replace advice given to you by your health care provider. Make sure you discuss any questions you have with your healthcare provider. Document Revised: 04/13/2018 Document Reviewed: 04/13/2018 Elsevier Patient Education  2022 [employer]  

## 2021-01-14 NOTE — Assessment & Plan Note (Signed)
Stable, long standing left side.

## 2021-01-14 NOTE — Assessment & Plan Note (Signed)
Unable to tolerate statins has myalgias

## 2021-01-14 NOTE — Assessment & Plan Note (Signed)
Well controlled, no changes to meds. Encouraged heart healthy diet such as the DASH diet and exercise as tolerated.  °

## 2021-01-14 NOTE — Assessment & Plan Note (Signed)
Tolerating Eliquis and rate controlled 

## 2021-01-14 NOTE — Assessment & Plan Note (Signed)
Essentially having daily headaches now. Always on the left side. Starts in neck then generalizes across left side of head and she even notes pain/pressure behind left eye. Will proceed with CT head and neck and refer for PT and neurology evaluation to see if they can help her get some relief. Tizanidine qhs was not helpful and she felt like she developed urinary hesitancy. Will try Baclofen 5-10 mg qhs prn. And Tylenol does give some relief so she can try that prn

## 2021-01-14 NOTE — Assessment & Plan Note (Signed)
Supplement and monitor, taking 2000 IU daily

## 2021-01-14 NOTE — Progress Notes (Signed)
Patient ID: Sophia [name], female    DOB: Jan 19, 1941  Age: 80 y.o. MRN: [id number]    Subjective:  Subjective  HPI JUNI GLAAB presents for office visit today for follow up on recent dx of DDD and htn. She reports that her neck pain is worsening and bad to point that it wakes her up in middle of night and states that she cannot lay on her left side. She endorses that icy hot relives some of her neck pain temporarily which allows her to have better sleep. She limits taking tylenol to only once a day due to concern of liver complications. She reports that the neck pain alternates from waxing and waning and constant. Se states that she uses a neck pillow that sometimes gives her relief of the neck pain. She reports that either applying cold or heat does not help with her pain. She reports that she had an xray and DDD was found and dx.   She endorses trying Tizanidine 2 mg for neck pain, but it did not help and caused her decreased output of urination. Denies CP/palp/SOB/HA/congestion/fevers/GI or GU c/o. Taking meds as prescribed.  She reports that her tinnitus in left ear has not changed. She has a loop recorder for check on NSVT episodes, however at the moment it did not document any meaningful arrhythmia since her last AFIB in May 2021 and expresses interest in taking it out. Endorses taking 2000 IU of vitamin D supplements.   Review of Systems  Constitutional:  Negative for chills, fatigue and fever.  HENT:  Positive for tinnitus (left ear). Negative for congestion, rhinorrhea, sinus pressure, sinus pain, sore throat and trouble swallowing.   Eyes:  Negative for pain.  Respiratory:  Negative for cough and shortness of breath.   Cardiovascular:  Negative for chest pain, palpitations and leg swelling.  Gastrointestinal:  Negative for abdominal pain, blood in stool, diarrhea, nausea and vomiting.  Genitourinary:  Negative for decreased urine volume, flank pain, frequency, vaginal bleeding  and vaginal discharge.  Musculoskeletal:  Positive for arthralgias and neck pain. Negative for back pain.  Neurological:  Negative for headaches.   History Past Medical History:  Diagnosis Date   Anxiety and depression    Arthritis    hands, knees, etc   Bladder prolapse, female, acquired 09/16/2014   Bowen's disease    Diverticulitis    Diverticulosis    GERD (gastroesophageal reflux disease)    H/O hematuria    for several years, work only revealed kidney stone on right side   History of chicken pox    History of kidney stones    Hyperlipidemia    Hypertension    Leg cramps, sleep related 04/02/2016   Myalgia 04/25/2017   Osteopenia 06/26/2014   Osteoporosis 06/26/2014   Plantar wart of left foot 10/15/2016   Right shoulder pain 04/23/2017   SCC (squamous cell carcinoma)    arms   Statin intolerance 09/11/2019    She has a past surgical history that includes Cholecystectomy (2005); Skin biopsy; and LOOP RECORDER INSERTION (N/A, 04/15/2018).   Her family history includes Birth defects in her maternal uncle; Cancer in her father, mother, and paternal grandfather; Early death in her daughter; Heart attack in her mother; Heart disease in her brother, maternal grandfather, and mother; Hyperlipidemia in her brother and mother; Hypertension in her brother, daughter, father, and mother; Stroke in her brother; Tuberculosis in her paternal grandmother.She reports that she has never smoked. She has never used smokeless tobacco.  She reports that she does not drink alcohol and does not use drugs.  Current Outpatient Medications on File Prior to Visit  Medication Sig Dispense Refill   acetaminophen (TYLENOL) 500 MG tablet Take 1,000 mg by mouth 2 (two) times daily as needed for moderate pain or headache.     Calcium Carb-Cholecalciferol (CALCIUM 600 + D PO) Take 1 tablet by mouth daily.     carvedilol (COREG) 25 MG tablet TAKE 1 TABLET(25 MG) BY MOUTH TWICE DAILY WITH A MEAL 180 tablet 1    Cholecalciferol (VITAMIN D) 50 MCG (2000 UT) tablet Take 1 tablet (2,000 Units total) by mouth daily.     Co-Enzyme Q10 200 MG CAPS Take 200 mg by mouth daily.     diltiazem (CARDIZEM CD) 240 MG 24 hr capsule TAKE 1 CAPSULE(240 MG) BY MOUTH DAILY 90 capsule 2   ELIQUIS 5 MG TABS tablet TAKE 1 TABLET(5 MG) BY MOUTH TWICE DAILY 180 tablet 1   estradiol (ESTRACE) 0.1 MG/GM vaginal cream Place 1 Applicatorful vaginally 2 (two) times a week.     Evolocumab (REPATHA SURECLICK) 027 MG/ML SOAJ Inject 1 Dose into the skin every 14 (fourteen) days. 2.1 mL 11   famotidine (PEPCID) 20 MG tablet Take 1 tablet (20 mg total) by mouth 2 (two) times daily as needed for heartburn or indigestion. Pt takes before bed daily 180 tablet 0   Hypromellose (ARTIFICIAL TEARS OP) Place 1 drop into both eyes 2 (two) times daily.     Probiotic Product (PROBIOTIC DAILY PO) Take 1 capsule by mouth daily.      No current facility-administered medications on file prior to visit.     Objective:  Objective  Physical Exam Constitutional:      General: She is not in acute distress.    Appearance: Normal appearance. She is not ill-appearing or toxic-appearing.  HENT:     Head: Normocephalic and atraumatic.     Right Ear: Tympanic membrane, ear canal and external ear normal.     Left Ear: Tympanic membrane, ear canal and external ear normal.     Nose: No congestion or rhinorrhea.  Eyes:     Extraocular Movements: Extraocular movements intact.     Pupils: Pupils are equal, round, and reactive to light.  Cardiovascular:     Rate and Rhythm: Normal rate and regular rhythm.     Pulses: Normal pulses.     Heart sounds: Normal heart sounds. No murmur heard. Pulmonary:     Effort: Pulmonary effort is normal. No respiratory distress.     Breath sounds: Normal breath sounds. No wheezing, rhonchi or rales.  Abdominal:     General: Bowel sounds are normal.     Palpations: Abdomen is soft. There is no mass.     Tenderness: There  is no abdominal tenderness. There is no guarding.     Hernia: No hernia is present.  Musculoskeletal:        General: Normal range of motion.     Left shoulder: Tenderness (left sternocleidomastoid muscle) present.     Cervical back: Normal range of motion and neck supple.  Skin:    General: Skin is warm and dry.  Neurological:     Mental Status: She is alert and oriented to person, place, and time.  Psychiatric:        Behavior: Behavior normal.   BP 102/64   Pulse 64   Temp 98.1 F (36.7 C)   Resp (!) 99   Wt 139 lb  3.2 oz (63.1 kg)   HC 16" (40.6 cm)   BMI 26.30 kg/m  Wt Readings from Last 3 Encounters:  01/14/21 139 lb 3.2 oz (63.1 kg)  09/12/20 140 lb (63.5 kg)  08/30/20 139 lb 9.6 oz (63.3 kg)     Lab Results  Component Value Date   WBC 9.3 04/15/2020   HGB 13.1 04/15/2020   HCT 39.1 04/15/2020   PLT 152 04/15/2020   GLUCOSE 95 04/15/2020   CHOL 143 07/22/2020   TRIG 171 (H) 07/22/2020   HDL 54 07/22/2020   LDLDIRECT 137.0 12/25/2019   LDLCALC 61 07/22/2020   ALT 14 04/15/2020   AST 16 04/15/2020   NA 135 04/15/2020   K 4.6 04/15/2020   CL 100 04/15/2020   CREATININE 0.82 04/15/2020   BUN 10 04/15/2020   CO2 27 04/15/2020   TSH 0.71 04/02/2020   HGBA1C 5.7 (H) 04/02/2020    DG Cervical Spine Complete  Result Date: 09/14/2020 CLINICAL DATA:  Pain. EXAM: CERVICAL SPINE - COMPLETE 4+ VIEW COMPARISON:  April 26, 2018 FINDINGS: The pre odontoid space and prevertebral soft tissues are normal. The cervical spine is well seen to the bottom of C6. C7 and the cervicothoracic junction are partially obscured by bones and soft tissues. No malalignment identified. No fractures are noted. Neural foramina are patent. Uncovertebral changes are identified. Mild degenerative disc disease at C5-6. Lateral masses of C1 and visualized portions of the odontoid process are normal. IMPRESSION: Degenerative changes as above. No other acute abnormalities. C7 and T1 are partially  obscured by overlapping bones and soft tissues. Electronically Signed   By: Dorise Bullion III M.D   On: 09/14/2020 09:26   DG Shoulder Left  Result Date: 09/14/2020 CLINICAL DATA:  Pain. EXAM: LEFT SHOULDER - 2+ VIEW COMPARISON:  None. FINDINGS: There is no evidence of fracture or dislocation. There is no evidence of arthropathy or other focal bone abnormality. Soft tissues are unremarkable. IMPRESSION: Negative. Electronically Signed   By: Dorise Bullion III M.D   On: 09/14/2020 09:26     Assessment & Plan:  Plan    Meds ordered this encounter  Medications   baclofen (LIORESAL) 10 MG tablet    Sig: Take 0.5-1 tablets (5-10 mg total) by mouth at bedtime as needed for muscle spasms.    Dispense:  30 each    Refill:  1    Problem List Items Addressed This Visit       Cardiovascular and Mediastinum   Hypertension   Relevant Orders   CBC   Comprehensive metabolic panel   Lipid panel   TSH     Digestive   GERD (gastroesophageal reflux disease)     Other   Hyperlipidemia   Palpitations   Hyperglycemia   Relevant Orders   Lipid panel   Hemoglobin A1c   Vitamin D deficiency   Relevant Orders   CBC   VITAMIN D 25 Hydroxy (Vit-D Deficiency, Fractures)   Other Visit Diagnoses     Cervicalgia    -  Primary   Relevant Orders   Ambulatory referral to Physical Therapy   Ambulatory referral to Neurology   CT SOFT TISSUE NECK WO CONTRAST   Nonintractable headache, unspecified chronicity pattern, unspecified headache type       Relevant Medications   baclofen (LIORESAL) 10 MG tablet   Other Relevant Orders   Ambulatory referral to Physical Therapy   Ambulatory referral to Neurology   CT Head Wo Contrast  Follow-up: Return in about 3 months (around 04/16/2021).  I, Suezanne Jacquet, acting as a scribe for Penni Homans, MD, have documented all relevent documentation on behalf of Penni Homans, MD, as directed by Penni Homans, MD while in the presence of Penni Homans,  MD.  I, Mosie Lukes, MD personally performed the services described in this documentation. All medical record entries made by the scribe were at my direction and in my presence. I have reviewed the chart and agree that the record reflects my personal performance and is accurate and complete

## 2021-01-16 LAB — CUP PACEART REMOTE DEVICE CHECK
Date Time Interrogation Session: 20220716002645
Implantable Pulse Generator Implant Date: 20191018

## 2021-01-20 ENCOUNTER — Ambulatory Visit (INDEPENDENT_AMBULATORY_CARE_PROVIDER_SITE_OTHER): Payer: Medicare HMO

## 2021-01-20 DIAGNOSIS — I480 Paroxysmal atrial fibrillation: Secondary | ICD-10-CM | POA: Diagnosis not present

## 2021-01-21 ENCOUNTER — Other Ambulatory Visit: Payer: Self-pay | Admitting: Family Medicine

## 2021-01-21 DIAGNOSIS — M1990 Unspecified osteoarthritis, unspecified site: Secondary | ICD-10-CM

## 2021-01-21 DIAGNOSIS — M542 Cervicalgia: Secondary | ICD-10-CM

## 2021-01-22 ENCOUNTER — Other Ambulatory Visit: Payer: Self-pay

## 2021-01-22 ENCOUNTER — Ambulatory Visit (HOSPITAL_BASED_OUTPATIENT_CLINIC_OR_DEPARTMENT_OTHER)
Admission: RE | Admit: 2021-01-22 | Discharge: 2021-01-22 | Disposition: A | Discharge status: Home / Self Care | Payer: Medicare HMO | Source: Ambulatory Visit | Attending: Family Medicine | Admitting: Family Medicine

## 2021-01-22 ENCOUNTER — Ambulatory Visit (HOSPITAL_BASED_OUTPATIENT_CLINIC_OR_DEPARTMENT_OTHER): Admission: RE | Admit: 2021-01-22 | Payer: Medicare HMO | Source: Ambulatory Visit

## 2021-01-22 DIAGNOSIS — R519 Headache, unspecified: Secondary | ICD-10-CM | POA: Diagnosis not present

## 2021-01-22 DIAGNOSIS — M1990 Unspecified osteoarthritis, unspecified site: Secondary | ICD-10-CM | POA: Insufficient documentation

## 2021-01-22 DIAGNOSIS — M542 Cervicalgia: Secondary | ICD-10-CM

## 2021-01-22 DIAGNOSIS — M4312 Spondylolisthesis, cervical region: Secondary | ICD-10-CM | POA: Diagnosis not present

## 2021-01-22 DIAGNOSIS — M47812 Spondylosis without myelopathy or radiculopathy, cervical region: Secondary | ICD-10-CM | POA: Diagnosis not present

## 2021-01-24 ENCOUNTER — Other Ambulatory Visit: Payer: Self-pay | Admitting: Family Medicine

## 2021-01-24 DIAGNOSIS — M4802 Spinal stenosis, cervical region: Secondary | ICD-10-CM

## 2021-01-29 ENCOUNTER — Encounter: Payer: Self-pay | Admitting: Physical Therapy

## 2021-01-29 ENCOUNTER — Ambulatory Visit: Payer: Medicare HMO | Attending: Family Medicine | Admitting: Physical Therapy

## 2021-01-29 ENCOUNTER — Other Ambulatory Visit: Payer: Self-pay

## 2021-01-29 DIAGNOSIS — M5412 Radiculopathy, cervical region: Secondary | ICD-10-CM

## 2021-01-29 DIAGNOSIS — R293 Abnormal posture: Secondary | ICD-10-CM

## 2021-01-29 DIAGNOSIS — M542 Cervicalgia: Secondary | ICD-10-CM

## 2021-01-29 DIAGNOSIS — R29898 Other symptoms and signs involving the musculoskeletal system: Secondary | ICD-10-CM

## 2021-01-29 DIAGNOSIS — G4486 Cervicogenic headache: Secondary | ICD-10-CM | POA: Diagnosis not present

## 2021-01-29 NOTE — Therapy (Signed)
[facility] [address]  [place], [place], [address] Phone: [phone]   Fax:  [phone]  Physical Therapy Evaluation  Patient Details  Name: Sophia [name] MRN: [id number] Date of Birth: [birth year] Referring Provider (PT): Mosie Lukes, MD   Encounter Date: 01/29/2021   PT End of Session - 01/29/21 1106     Visit Number 1    Number of Visits 13    Date for PT Re-Evaluation 03/19/21    Authorization Type [employer] - prior auth required    PT Start Time 1106    PT Stop Time [id number]    PT Time Calculation (min) 49 min    Activity Tolerance Patient tolerated treatment well    Behavior During Therapy [facility] for tasks assessed/performed             Past Medical History:  Diagnosis Date   Anxiety and depression    Arthritis    hands, knees, etc   Bladder prolapse, female, acquired 09/16/2014   Bowen's disease    Diverticulitis    Diverticulosis    GERD (gastroesophageal reflux disease)    H/O hematuria    for several years, work only revealed kidney stone on right side   History of chicken pox    History of kidney stones    Hyperlipidemia    Hypertension    Leg cramps, sleep related 04/02/2016   Myalgia 04/25/2017   Osteopenia 06/26/2014   Osteoporosis 06/26/2014   Plantar wart of left foot 10/15/2016   Right shoulder pain 04/23/2017   SCC (squamous cell carcinoma)    arms   Statin intolerance 09/11/2019    Past Surgical History:  Procedure Laterality Date   CHOLECYSTECTOMY  2005   LOOP RECORDER INSERTION N/A 04/15/2018   Procedure: LOOP RECORDER INSERTION;  Surgeon: Sanda Klein, MD;  Location: [facility];  Service: Cardiovascular;  Laterality: N/A;   SKIN BIOPSY     multiple    There were no vitals filed for this visit.    Subjective Assessment - 01/29/21 1111     Subjective Pt reports multiple year h/o of neck pain with intermittent radiation into L shoulder & upper  arm, along with numbness and tingling into L UE. Pain limits sleeping tolerance - unable to lay on her L side but also difficulty with laying on her back or R side. Pt reports daily headaches but not constant. Both heat and cold make pain worse.    Diagnostic tests 01/23/21 CT head & neck: Negative CT head   Cervical spondylosis most prominent C5-6. Severe left foraminal stenosis C5-6.    Patient Stated Goals "to have less pain and not have to have surgery on my neck"    Currently in Pain? Yes    Pain Score 5     Pain Location Head    Pain Orientation Left   starts in parietal and extends down into occipital and up/forward into her orbit/sinus   Pain Descriptors / Indicators Tender;Radiating    Pain Type Chronic pain    Pain Radiating Towards intermittent pain numbness & tingling into L shoudler and upper arm    Pain Onset More than a month ago   first noted before COVID, with exacerbation including radiating pain ~6 months ago   Pain Frequency Intermittent    Aggravating Factors  heat ot cold, laying on L side, working in her garden, Clinical cytogeneticist (forward head position), shopping (carrying groceries)  Pain Relieving Factors Tylenol (limited ability to take Tylenol d/t liver - no more than 1x/day), laying on back with neck pillow under neck    Effect of Pain on Daily Activities interferes with sleeping and turning head while driving, limits tolerance for volunteer work and gardening                [facility] PT Assessment - 01/29/21 1106       Assessment   Medical Diagnosis Cervicalgia with nonintractable headache    Referring Provider (PT) Mosie Lukes, MD    Onset Date/Surgical Date --   chronic - neck pain onset pre-COVID pandemic with exacerbation into headaches ~6 months ago   Hand Dominance Right    Next MD Visit 04/17/21    Prior Therapy none      Precautions   Precautions None      Restrictions   Weight Bearing Restrictions No      Balance Screen   Has the patient  fallen in the past 6 months No    Has the patient had a decrease in activity level because of a fear of falling?  No    Is the patient reluctant to leave their home because of a fear of falling?  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   Overall Cognitive Status Within Functional Limits for tasks assessed      Observation/Other Assessments   Focus on Therapeutic Outcomes (FOTO)  Neck = 50; predicted D/C FS=57      Posture/Postural Control   Posture/Postural Control Postural limitations    Postural Limitations Forward head;Rounded Shoulders;Increased thoracic kyphosis;Flexed trunk      ROM / Strength   AROM / PROM / Strength AROM;Strength      AROM   Overall AROM Comments B shoulder ROM WNL    AROM Assessment Site Cervical    Cervical Flexion 38    Cervical Extension 24    Cervical - Right Side Bend 25    Cervical - Left Side Bend 16    Cervical - Right Rotation 56    Cervical - Left Rotation 40      Strength   Overall Strength Comments B shoulder strength 4+/5 to 5/5      Palpation   Palpation comment increased muscle tension & TTP in L>R UT, LS cervical paraspinals and subocciptals                        Objective measurements completed on examination: See above findings.       [facility] PT Treatment/Exercise - 01/29/21 1106       Exercises   Exercises Neck      Neck Exercises: Seated   Neck Retraction 5 reps;5 secs    Neck Retraction Limitations cues to elongate neck with retraction     Other Seated Exercise B scap retraction & depression 5 x 5 sec  Neck Exercises: Stretches   Upper Trapezius Stretch Left;Right;1 rep;30 seconds    Levator Stretch Left;Right;1 rep;30 seconds    Corner Stretch 3 reps;30 seconds    Corner Stretch Limitations low, mid & high doorway pec stretches   high position deferred from HEP d/t increased L shoulder pain                   PT Education - 01/29/21 1155     Education Details PT eval findings, anticipated POC & initial HEP - Access Code: [email or web address]    Person(s) Educated Patient    Methods Explanation;Demonstration;Verbal cues;Handout    Comprehension Verbalized understanding;Verbal cues required;Returned demonstration;Need further instruction              PT Short Term Goals - 01/29/21 1155       PT SHORT TERM GOAL #1   Title Patient will be independent with initial HEP    Status New    Target Date 02/19/21      PT SHORT TERM GOAL #2   Title Patient will verbalize/demonstrate good awareness of neutral spine posture and proper body mechanics for daily tasks    Status New    Target Date 02/19/21               PT Long Term Goals - 01/29/21 1155       PT LONG TERM GOAL #1   Title Patient will be independent with ongoing/advanced HEP for self-management at home in order to build upon functional gains in therapy    Status New    Target Date 03/19/21      PT LONG TERM GOAL #2   Title Patient to demonstrate ability to achieve and maintain good spinal alignment/posturing    Status New    Target Date 03/19/21      PT LONG TERM GOAL #3   Title Patient to improve cervical AROM to [facility] without pain provocation    Status New    Target Date 03/19/21      PT LONG TERM GOAL #4   Title Patient to report >/= 50-75% reduction in frequency and intensity of headaches and/or L UE radiculopathy    Status New    Target Date 03/19/21      PT LONG TERM GOAL #5   Title Patient to report ability to perform ADLs,  household, volunteer and gardening related tasks without increased pain    Status New    Target Date 03/19/21      PT LONG TERM GOAL #6   Title Patient will report no limitation with ability to turn her head while driving due to neck pain or stiffness    Status New    Target Date 03/19/21                    Plan - 01/29/21 1155     Clinical Impression Statement Sophia [name] is a 80 y/o female who is referred to OP PT for cervicalgia and nonintractable headaches. She reports she first noticed issues with her neck prior to the onset of the COVID pandemic with headaches and intermittent L UE radiculopathy becoming more of an issue ~6 months ago. Pain and radicular symptoms interfere with sleeping and turning head while driving, and limits tolerance for volunteer work and gardening. Headaches are not constant but occur almost daily, typically originating as tenderness in L parietal region and extending down into occipital and up/forward into her L orbit/sinus. Current deficits include frequent L sided headaches,  L sided neck pain and stiffness, increased muscle tension in suboccipitals, cervical paraspinals, upper shoulders and pecs, abnormal posture, and limited cervical ROM. Sophia [name] will benefit from skilled PT to address above deficits, promote neutral spinal posture and restore functional cervical ROM with decreased pain, muscle tightness and headache frequency and intensity to restore improved quality of life with decreased pain interference.    Personal Factors and Comorbidities Age;Comorbidity 3+;Past/Current Experience;Social Background;Time since onset of injury/illness/exacerbation    Comorbidities HTN, a-fib, NSVT, near syncope, GERD, diverticulosis, OA, osteoporosis, chronic LBP w/o sciatica, chronic hip pain, Raynaud phenomenon, sleep related leg cramps    Examination-Activity Limitations Bend;Caring for Others;Lift;Carry;Reach Overhead;Sit;Sleep;Stand    Examination-Participation  Restrictions Cleaning;Community Activity;Driving;Laundry;Meal Prep;Shop;Volunteer;Yard Work    Merchant navy officer Evolving/Moderate complexity    Clinical Decision Making Moderate    Rehab Potential Good    PT Frequency 2x / week    PT Duration 6 weeks    PT Treatment/Interventions ADLs/Self Care Home Management;Electrical Stimulation;Iontophoresis '4mg'$ /ml Dexamethasone;Moist Heat;Traction;Ultrasound;Functional mobility training;Therapeutic activities;Therapeutic exercise;Neuromuscular re-education;Patient/family education;Manual techniques;Passive range of motion;Dry needling;Taping    PT Next Visit Plan Review initial HEP; further assess cervical soft tissue flexibility/mobility - manual therapy including possible DN as indicated; postural stretching and strengthening; modalities PRN; posture & body mechanics education    PT Home Exercise Plan Access Code: [email or web address] (8/3)    Consulted and Agree with Plan of Care Patient             Patient will benefit from skilled therapeutic intervention in order to improve the following deficits and impairments:  Decreased activity tolerance, Decreased knowledge of precautions, Decreased mobility, Decreased range of motion, Decreased safety awareness, Decreased strength, Hypomobility, Increased fascial restricitons, Increased muscle spasms, Impaired perceived functional ability, Impaired flexibility, Impaired UE functional use, Improper body mechanics, Postural dysfunction, Pain  Visit Diagnosis: Cervicalgia  Radiculopathy, cervical region  Cervicogenic headache  Abnormal posture  Other symptoms and signs involving the musculoskeletal system     Problem List Patient Active Problem List   Diagnosis Date Noted   Nonintractable headache 01/14/2021   Allergic drug reaction 03/26/2020   Vitamin D deficiency 12/28/2019   Complex renal cyst 12/28/2019   Elevated liver function tests 12/22/2019   Bug bite 12/22/2019   Persistent  atrial fibrillation ([place]) 11/13/2019   Secondary hypercoagulable state ([place]) 11/13/2019   Encounter for loop recorder check 10/20/2019   Long term (current) use of anticoagulants 10/20/2019   Chronic bilateral low back pain without sciatica 09/24/2019   Statin intolerance 09/11/2019   Diarrhea 08/08/2018   Raynaud phenomenon 08/08/2018   Pulsatile tinnitus 04/26/2018   Hyperglycemia 04/26/2018   Cervicalgia 04/26/2018   Near syncope 04/15/2018   NSVT (nonsustained ventricular tachycardia) ([place]) 04/14/2018   Palpitations 02/25/2018   Bilateral carotid bruits 02/25/2018   Myalgia 04/25/2017   Left shoulder pain 04/23/2017   Plantar wart of left foot 10/15/2016   Leg cramps, sleep related 04/02/2016   Hip pain 05/29/2015   Preventative health care 03/24/2015   Bladder prolapse, female, acquired 09/16/2014   Osteoporosis 06/26/2014   Arthritis    GERD (gastroesophageal reflux disease)    Hypertension    Hyperlipidemia    History of kidney stones    H/O hematuria    Diverticulosis    History of chicken pox    Anxiety and depression    SCC (squamous cell carcinoma)    Bowen's disease     Percival Spanish, PT, MPT 01/29/2021, 2:33 PM  [facility]  [address]  [place], [place], [address] Phone: [phone]   Fax:  [phone]  Name: Sophia [name] MRN: [id number] Date of Birth: Mar 03, 1941

## 2021-01-29 NOTE — Patient Instructions (Signed)
    Access Code: [email or web address] URL: [email or web address] Date: 01/29/2021 Prepared by: Annie Paras  Exercises Seated Cervical Retraction - 2-3 x daily - 7 x weekly - 2 sets - 10 reps - 3-5 sec hold Seated Gentle Upper Trapezius Stretch - 2-3 x daily - 7 x weekly - 3 reps - 30 sec hold Gentle Levator Scapulae Stretch - 2-3 x daily - 7 x weekly - 3 reps - 30 sec hold Doorway Pec Stretch at 60 Elevation - 2-3 x daily - 7 x weekly - 3 reps - 30 sec hold Doorway Pec Stretch at 90 Degrees Abduction - 2-3 x daily - 7 x weekly - 3 reps - 30 sec hold Seated Scapular Retraction - 2-3 x daily - 7 x weekly - 2 sets - 10 reps - 5 sec hold

## 2021-02-05 ENCOUNTER — Encounter: Payer: Self-pay | Admitting: Physical Therapy

## 2021-02-05 ENCOUNTER — Other Ambulatory Visit: Payer: Self-pay

## 2021-02-05 ENCOUNTER — Ambulatory Visit: Payer: Medicare HMO | Admitting: Physical Therapy

## 2021-02-05 DIAGNOSIS — M5412 Radiculopathy, cervical region: Secondary | ICD-10-CM | POA: Diagnosis not present

## 2021-02-05 DIAGNOSIS — G4486 Cervicogenic headache: Secondary | ICD-10-CM

## 2021-02-05 DIAGNOSIS — R29898 Other symptoms and signs involving the musculoskeletal system: Secondary | ICD-10-CM | POA: Diagnosis not present

## 2021-02-05 DIAGNOSIS — R293 Abnormal posture: Secondary | ICD-10-CM | POA: Diagnosis not present

## 2021-02-05 DIAGNOSIS — M542 Cervicalgia: Secondary | ICD-10-CM

## 2021-02-05 NOTE — Patient Instructions (Signed)
     Access Code: [email or web address] URL: [email or web address] Date: 02/05/2021 Prepared by: Annie Paras  Exercises Seated Cervical Retraction - 2-3 x daily - 7 x weekly - 2 sets - 10 reps - 3-5 sec hold Seated Gentle Upper Trapezius Stretch - 2-3 x daily - 7 x weekly - 3 reps - 30 sec hold Gentle Levator Scapulae Stretch - 2-3 x daily - 7 x weekly - 3 reps - 30 sec hold Doorway Pec Stretch at 60 Elevation - 2-3 x daily - 7 x weekly - 3 reps - 30 sec hold Doorway Pec Stretch at 90 Degrees Abduction - 2-3 x daily - 7 x weekly - 3 reps - 30 sec hold Seated Scapular Retraction - 2-3 x daily - 7 x weekly - 2 sets - 10 reps - 5 sec hold Mid-Lower Cervical Extension SNAG with Strap - 1-2 x daily - 7 x weekly - 2 sets - 10 reps - 3 sec hold Seated Assisted Cervical Rotation with Towel - 1-2 x daily - 7 x weekly - 2 sets - 10 reps - 3 sec hold Standing Bilateral Low Shoulder Row with Anchored Resistance - 1 x daily - 7 x weekly - 2 sets - 10 reps - 3-5 sec hold Scapular Retraction with Resistance Advanced - 1 x daily - 7 x weekly - 2 sets - 10 reps - 3-5 sec hold

## 2021-02-05 NOTE — Therapy (Signed)
[facility] [address]  [place], [place], [address] Phone: [phone]   Fax:  [phone]  Physical Therapy Treatment  Patient Details  Name: Sophia [name] MRN: [id number] Date of Birth: 1941-06-29 Referring Provider (PT): Mosie Lukes, MD   Encounter Date: 02/05/2021   PT End of Session - 02/05/21 0846     Visit Number 2    Number of Visits 13    Date for PT Re-Evaluation 03/19/21    Authorization Type [employer]    Authorization Time Period 02/05/21 - 03/19/21    Authorization - Number of Visits 1    Progress Note Due on Visit 4    PT Start Time 0846    PT Stop Time 0928    PT Time Calculation (min) 42 min    Activity Tolerance Patient tolerated treatment well    Behavior During Therapy [facility] for tasks assessed/performed             Past Medical History:  Diagnosis Date   Anxiety and depression    Arthritis    hands, knees, etc   Bladder prolapse, female, acquired 09/16/2014   Bowen's disease    Diverticulitis    Diverticulosis    GERD (gastroesophageal reflux disease)    H/O hematuria    for several years, work only revealed kidney stone on right side   History of chicken pox    History of kidney stones    Hyperlipidemia    Hypertension    Leg cramps, sleep related 04/02/2016   Myalgia 04/25/2017   Osteopenia 06/26/2014   Osteoporosis 06/26/2014   Plantar wart of left foot 10/15/2016   Right shoulder pain 04/23/2017   SCC (squamous cell carcinoma)    arms   Statin intolerance 09/11/2019    Past Surgical History:  Procedure Laterality Date   CHOLECYSTECTOMY  2005   LOOP RECORDER INSERTION N/A 04/15/2018   Procedure: LOOP RECORDER INSERTION;  Surgeon: Sanda Klein, MD;  Location: [facility];  Service: Cardiovascular;  Laterality: N/A;   SKIN BIOPSY     multiple    There were no vitals filed for this visit.   Subjective Assessment - 02/05/21 0851     Subjective  Pt already noting improvement with initial HEP - notes her arm is not going to sleep at night.    Diagnostic tests 01/23/21 CT head & neck: Negative CT head   Cervical spondylosis most prominent C5-6. Severe left foraminal stenosis C5-6.    Patient Stated Goals "to have less pain and not have to have surgery on my neck"    Currently in Pain? Yes    Pain Score 4     Pain Location Neck    Pain Orientation Left    Pain Descriptors / Indicators Discomfort    Pain Type Chronic pain    Pain Onset --   first noted before COVID, with exacerbation including radiating pain ~6 months ago   Pain Frequency Intermittent                               [facility] Adult PT Treatment/Exercise - 02/05/21 0846       Exercises   Exercises Neck      Neck Exercises: Machines for Strengthening   UBE (Upper Arm Bike) L1.0 x 6 min (3' fwd/3' min back)      Neck Exercises: Theraband   Shoulder Extension  10 reps   yellow TB   Shoulder Extension Limitations standing at doorframe - cues for scap retraction & depression    Rows 10 reps   yellow TB   Rows Limitations standing at doorframe - cues for scap retraction & depression      Neck Exercises: Seated   Neck Retraction 10 reps;5 secs    Neck Retraction Limitations good return demonstration    Cervical Rotation Right;Left;10 reps    Cervical Rotation Limitations rotational SNAG with pillowcase    Other Seated Exercise B scap retraction & depression 10 x 5 sec    Other Seated Exercise Cervical extension SNAG with pillowcase support 10 x 3"      Neck Exercises: Stretches   Upper Trapezius Stretch Right;Left;2 reps;30 seconds    Levator Stretch Right;Left;2 reps;30 seconds    Corner Stretch 3 reps;30 seconds    Corner Stretch Limitations low & mid doorway pec stretches   low stretch performed B with elbows straight & mid stretch isolated to 1 arm at a time                   PT Education - 02/05/21 0926     Education Details HEP  update - cervical SNAGs & yellow TB rows/retraction - Access Code: [email or web address]    Person(s) Educated Patient    Methods Explanation;Demonstration;Verbal cues;Tactile cues;Handout    Comprehension Verbalized understanding;Verbal cues required;Tactile cues required;Returned demonstration;Need further instruction              PT Short Term Goals - 02/05/21 0855       PT SHORT TERM GOAL #1   Title Patient will be independent with initial HEP    Status On-going    Target Date 02/19/21      PT SHORT TERM GOAL #2   Title Patient will verbalize/demonstrate good awareness of neutral spine posture and proper body mechanics for daily tasks    Status On-going    Target Date 02/19/21               PT Long Term Goals - 02/05/21 0855       PT LONG TERM GOAL #1   Title Patient will be independent with ongoing/advanced HEP for self-management at home in order to build upon functional gains in therapy    Status On-going    Target Date 03/19/21      PT LONG TERM GOAL #2   Title Patient to demonstrate ability to achieve and maintain good spinal alignment/posturing    Status On-going    Target Date 03/19/21      PT LONG TERM GOAL #3   Title Patient to improve cervical AROM to [facility] without pain provocation    Status On-going    Target Date 03/19/21      PT LONG TERM GOAL #4   Title Patient to report >/= 50-75% reduction in frequency and intensity of headaches and/or L UE radiculopathy    Status On-going    Target Date 03/19/21      PT LONG TERM GOAL #5   Title Patient to report ability to perform ADLs, household, volunteer and gardening related tasks without increased pain    Status On-going    Target Date 03/19/21      PT LONG TERM GOAL #6   Title Patient will report no limitation with ability to turn her head while driving due to neck pain or stiffness    Status On-going    Target Date 03/19/21  Plan - 02/05/21 0858     Clinical Impression  Statement Sophia [name] already noting reduction in her radicular symptoms with performance of initial HEP, reporting her arm no longer goes numb while she is sleeping. Reviewed initial HEP with minor changes made to doorway pec stretches to improve tolerance/decrease pain, but otherwise pt able to perform good return demonstration of all stretches and exercises. Introduced cervical SNAGs for extension and rotation with pt requiring cues to avoid creating pressure against jaw/head during rotational motions. Progressed postural strengthening with addition of yellow TB resisted rows/retraction with pt able to perform good return demonstration. HEP updated to reflect exercise progression today.    Comorbidities HTN, a-fib, NSVT, near syncope, GERD, diverticulosis, OA, osteoporosis, chronic LBP w/o sciatica, chronic hip pain, Raynaud phenomenon, sleep related leg cramps    Rehab Potential Good    PT Frequency 2x / week    PT Duration 6 weeks    PT Treatment/Interventions ADLs/Self Care Home Management;Electrical Stimulation;Iontophoresis '4mg'$ /ml Dexamethasone;Moist Heat;Traction;Ultrasound;Functional mobility training;Therapeutic activities;Therapeutic exercise;Neuromuscular re-education;Patient/family education;Manual techniques;Passive range of motion;Dry needling;Taping    PT Next Visit Plan further assess cervical soft tissue flexibility/mobility - manual therapy including possible DN as indicated; postural stretching and strengthening; modalities PRN; posture & body mechanics education    PT Home Exercise Plan Access Code: [email or web address] (8/3, updated 8/10)    Consulted and Agree with Plan of Care Patient             Patient will benefit from skilled therapeutic intervention in order to improve the following deficits and impairments:  Decreased activity tolerance, Decreased knowledge of precautions, Decreased mobility, Decreased range of motion, Decreased safety awareness, Decreased strength, Hypomobility,  Increased fascial restricitons, Increased muscle spasms, Impaired perceived functional ability, Impaired flexibility, Impaired UE functional use, Improper body mechanics, Postural dysfunction, Pain  Visit Diagnosis: Cervicalgia  Radiculopathy, cervical region  Cervicogenic headache  Abnormal posture  Other symptoms and signs involving the musculoskeletal system     Problem List Patient Active Problem List   Diagnosis Date Noted   Nonintractable headache 01/14/2021   Allergic drug reaction 03/26/2020   Vitamin D deficiency 12/28/2019   Complex renal cyst 12/28/2019   Elevated liver function tests 12/22/2019   Bug bite 12/22/2019   Persistent atrial fibrillation ([place]) 11/13/2019   Secondary hypercoagulable state ([place]) 11/13/2019   Encounter for loop recorder check 10/20/2019   Long term (current) use of anticoagulants 10/20/2019   Chronic bilateral low back pain without sciatica 09/24/2019   Statin intolerance 09/11/2019   Diarrhea 08/08/2018   Raynaud phenomenon 08/08/2018   Pulsatile tinnitus 04/26/2018   Hyperglycemia 04/26/2018   Cervicalgia 04/26/2018   Near syncope 04/15/2018   NSVT (nonsustained ventricular tachycardia) ([place]) 04/14/2018   Palpitations 02/25/2018   Bilateral carotid bruits 02/25/2018   Myalgia 04/25/2017   Left shoulder pain 04/23/2017   Plantar wart of left foot 10/15/2016   Leg cramps, sleep related 04/02/2016   Hip pain 05/29/2015   Preventative health care 03/24/2015   Bladder prolapse, female, acquired 09/16/2014   Osteoporosis 06/26/2014   Arthritis    GERD (gastroesophageal reflux disease)    Hypertension    Hyperlipidemia    History of kidney stones    H/O hematuria    Diverticulosis    History of chicken pox    Anxiety and depression    SCC (squamous cell carcinoma)    Bowen's disease     Percival Spanish, PT, MPT 02/05/2021, 12:56 PM  [facility] [address]  [place], [place], [address] Phone: [phone]   Fax:  [phone]  Name: Sophia [name] MRN: [id number] Date of Birth: 23-Oct-1940

## 2021-02-08 ENCOUNTER — Other Ambulatory Visit: Payer: Self-pay | Admitting: Internal Medicine

## 2021-02-10 ENCOUNTER — Ambulatory Visit: Payer: Medicare HMO | Admitting: Physical Therapy

## 2021-02-10 ENCOUNTER — Encounter: Payer: Self-pay | Admitting: Physical Therapy

## 2021-02-10 ENCOUNTER — Other Ambulatory Visit: Payer: Self-pay

## 2021-02-10 DIAGNOSIS — M5412 Radiculopathy, cervical region: Secondary | ICD-10-CM | POA: Diagnosis not present

## 2021-02-10 DIAGNOSIS — M542 Cervicalgia: Secondary | ICD-10-CM | POA: Diagnosis not present

## 2021-02-10 DIAGNOSIS — R29898 Other symptoms and signs involving the musculoskeletal system: Secondary | ICD-10-CM | POA: Diagnosis not present

## 2021-02-10 DIAGNOSIS — R293 Abnormal posture: Secondary | ICD-10-CM | POA: Diagnosis not present

## 2021-02-10 DIAGNOSIS — G4486 Cervicogenic headache: Secondary | ICD-10-CM

## 2021-02-10 NOTE — Therapy (Signed)
[facility] [address]  [place], [place], [address] Phone: [phone]   Fax:  [phone]  Physical Therapy Treatment  Patient Details  Name: Sophia [name] MRN: [id number] Date of Birth: [birth year] Referring Provider (PT): Mosie Lukes, MD   Encounter Date: 02/10/2021   PT End of Session - 02/10/21 0844     Visit Number 3    Number of Visits 13    Date for PT Re-Evaluation 03/19/21    Authorization Type [employer]    Authorization Time Period 02/05/21 - 03/19/21    Authorization - Number of Visits 2    Progress Note Due on Visit 4    PT Start Time [id number]    PT Stop Time 0924    PT Time Calculation (min) 40 min    Activity Tolerance Patient tolerated treatment well    Behavior During Therapy [facility] for tasks assessed/performed             Past Medical History:  Diagnosis Date   Anxiety and depression    Arthritis    hands, knees, etc   Bladder prolapse, female, acquired 09/16/2014   Bowen's disease    Diverticulitis    Diverticulosis    GERD (gastroesophageal reflux disease)    H/O hematuria    for several years, work only revealed kidney stone on right side   History of chicken pox    History of kidney stones    Hyperlipidemia    Hypertension    Leg cramps, sleep related 04/02/2016   Myalgia 04/25/2017   Osteopenia 06/26/2014   Osteoporosis 06/26/2014   Plantar wart of left foot 10/15/2016   Right shoulder pain 04/23/2017   SCC (squamous cell carcinoma)    arms   Statin intolerance 09/11/2019    Past Surgical History:  Procedure Laterality Date   CHOLECYSTECTOMY  2005   LOOP RECORDER INSERTION N/A 04/15/2018   Procedure: LOOP RECORDER INSERTION;  Surgeon: Sanda Klein, MD;  Location: [facility];  Service: Cardiovascular;  Laterality: N/A;   SKIN BIOPSY     multiple    There were no vitals filed for this visit.   Subjective Assessment - 02/10/21 0847     Subjective  Pt reporting a more intense headache today with radiation up into her orbit and sinuses as well as down her neck.    Diagnostic tests 01/23/21 CT head & neck: Negative CT head   Cervical spondylosis most prominent C5-6. Severe left foraminal stenosis C5-6.    Patient Stated Goals "to have less pain and not have to have surgery on my neck"    Currently in Pain? Yes    Pain Score 7     Pain Location Head    Pain Orientation Left    Pain Descriptors / Indicators Aching    Pain Type Chronic pain    Pain Radiating Towards L orbit and sinuses, down into neck    Pain Onset --   first noted before COVID, with exacerbation including radiating pain ~6 months ago   Pain Frequency Intermittent                               [facility] Adult PT Treatment/Exercise - 02/10/21 0844       Exercises   Exercises Neck      Neck Exercises: Machines for Strengthening   UBE (Upper Arm Bike) L1.5 x 6 min (  3' fwd/3' min back)      Neck Exercises: Theraband   Shoulder External Rotation 10 reps   yellow TB   Shoulder External Rotation Limitations hooklying over pool noodle; cues for scap retraction    Horizontal ABduction 10 reps   yellow TB   Horizontal ABduction Limitations hooklying over pool noodle; cues for scap retraction    Other Theraband Exercises Alt yellow TB UE diagonals 10 x 3"; hooklying over pool noodle; cues for scap retraction      Neck Exercises: Stretches   Chest Stretch 1 rep;60 seconds    Chest Stretch Limitations hooklying over pool noodle      Manual Therapy   Manual Therapy Soft tissue mobilization;Myofascial release;Passive ROM;Manual Traction    Soft tissue mobilization STM/DTM to B cervical paraspinals, suboccipitals, LT UT, LS, scalenes, SCM, pecs & anterior deltoid    Myofascial Release B subocciptal release; pin & stretch to L SCM & UT    Passive ROM gentle cervical ROM & stretching all directions    Manual Traction gentle manual cervical distraction 3 x 30 sec                       PT Short Term Goals - 02/05/21 0855       PT SHORT TERM GOAL #1   Title Patient will be independent with initial HEP    Status On-going    Target Date 02/19/21      PT SHORT TERM GOAL #2   Title Patient will verbalize/demonstrate good awareness of neutral spine posture and proper body mechanics for daily tasks    Status On-going    Target Date 02/19/21               PT Long Term Goals - 02/05/21 0855       PT LONG TERM GOAL #1   Title Patient will be independent with ongoing/advanced HEP for self-management at home in order to build upon functional gains in therapy    Status On-going    Target Date 03/19/21      PT LONG TERM GOAL #2   Title Patient to demonstrate ability to achieve and maintain good spinal alignment/posturing    Status On-going    Target Date 03/19/21      PT LONG TERM GOAL #3   Title Patient to improve cervical AROM to [facility] without pain provocation    Status On-going    Target Date 03/19/21      PT LONG TERM GOAL #4   Title Patient to report >/= 50-75% reduction in frequency and intensity of headaches and/or L UE radiculopathy    Status On-going    Target Date 03/19/21      PT LONG TERM GOAL #5   Title Patient to report ability to perform ADLs, household, volunteer and gardening related tasks without increased pain    Status On-going    Target Date 03/19/21      PT LONG TERM GOAL #6   Title Patient will report no limitation with ability to turn her head while driving due to neck pain or stiffness    Status On-going    Target Date 03/19/21                   Plan - 02/10/21 0924     Clinical Impression Statement Sophia [name] reports increased intensity of headache today with radiation in to orbit and sinuses as well as down along her neck. Increased muscle tension evident  in her L>R cervical paraspinals, SCM, UT, LS, pecs and suboccipitals - addressed with MT followed by positional stretching and postural  strengthening hooklying over a pool noodle. Pt noting better tolerance for pec stretch over noodle as compared to doorway stretch as higher positions at doorway were bother her L shoulder. Pt reported full resolution of her headache by end of session. Instructed in pt in self-release technique for suboccipitals using 2 tennis balls tied together in a sock.    Comorbidities HTN, a-fib, NSVT, near syncope, GERD, diverticulosis, OA, osteoporosis, chronic LBP w/o sciatica, chronic hip pain, Raynaud phenomenon, sleep related leg cramps    Rehab Potential Good    PT Frequency 2x / week    PT Duration 6 weeks    PT Treatment/Interventions ADLs/Self Care Home Management;Electrical Stimulation;Iontophoresis '4mg'$ /ml Dexamethasone;Moist Heat;Traction;Ultrasound;Functional mobility training;Therapeutic activities;Therapeutic exercise;Neuromuscular re-education;Patient/family education;Manual techniques;Passive range of motion;Dry needling;Taping    PT Next Visit Plan manual therapy to cervical musculature including possible DN as indicated; postural stretching and strengthening; modalities PRN; posture & body mechanics education    PT Home Exercise Plan Access Code: [email or web address] (8/3, updated 8/10)    Consulted and Agree with Plan of Care Patient             Patient will benefit from skilled therapeutic intervention in order to improve the following deficits and impairments:  Decreased activity tolerance, Decreased knowledge of precautions, Decreased mobility, Decreased range of motion, Decreased safety awareness, Decreased strength, Hypomobility, Increased fascial restricitons, Increased muscle spasms, Impaired perceived functional ability, Impaired flexibility, Impaired UE functional use, Improper body mechanics, Postural dysfunction, Pain  Visit Diagnosis: Cervicalgia  Radiculopathy, cervical region  Cervicogenic headache  Abnormal posture  Other symptoms and signs involving the musculoskeletal  system     Problem List Patient Active Problem List   Diagnosis Date Noted   Nonintractable headache 01/14/2021   Allergic drug reaction 03/26/2020   Vitamin D deficiency 12/28/2019   Complex renal cyst 12/28/2019   Elevated liver function tests 12/22/2019   Bug bite 12/22/2019   Persistent atrial fibrillation ([place]) 11/13/2019   Secondary hypercoagulable state ([place]) 11/13/2019   Encounter for loop recorder check 10/20/2019   Long term (current) use of anticoagulants 10/20/2019   Chronic bilateral low back pain without sciatica 09/24/2019   Statin intolerance 09/11/2019   Diarrhea 08/08/2018   Raynaud phenomenon 08/08/2018   Pulsatile tinnitus 04/26/2018   Hyperglycemia 04/26/2018   Cervicalgia 04/26/2018   Near syncope 04/15/2018   NSVT (nonsustained ventricular tachycardia) ([place]) 04/14/2018   Palpitations 02/25/2018   Bilateral carotid bruits 02/25/2018   Myalgia 04/25/2017   Left shoulder pain 04/23/2017   Plantar wart of left foot 10/15/2016   Leg cramps, sleep related 04/02/2016   Hip pain 05/29/2015   Preventative health care 03/24/2015   Bladder prolapse, female, acquired 09/16/2014   Osteoporosis 06/26/2014   Arthritis    GERD (gastroesophageal reflux disease)    Hypertension    Hyperlipidemia    History of kidney stones    H/O hematuria    Diverticulosis    History of chicken pox    Anxiety and depression    SCC (squamous cell carcinoma)    Bowen's disease     Percival Spanish, PT, MPT 02/10/2021, 9:43 AM  [facility] [address]  [place], [place], [address] Phone: [phone]   Fax:  [phone]  Name: Sophia [name] MRN: [id number] Date of Birth: 01/15/1941

## 2021-02-11 ENCOUNTER — Ambulatory Visit (INDEPENDENT_AMBULATORY_CARE_PROVIDER_SITE_OTHER): Payer: Medicare HMO

## 2021-02-11 DIAGNOSIS — I480 Paroxysmal atrial fibrillation: Secondary | ICD-10-CM

## 2021-02-11 LAB — CUP PACEART REMOTE DEVICE CHECK
Date Time Interrogation Session: 20220816005736
Implantable Pulse Generator Implant Date: 20191018

## 2021-02-12 ENCOUNTER — Other Ambulatory Visit: Payer: Self-pay

## 2021-02-12 ENCOUNTER — Ambulatory Visit: Payer: Medicare HMO

## 2021-02-12 DIAGNOSIS — R29898 Other symptoms and signs involving the musculoskeletal system: Secondary | ICD-10-CM | POA: Diagnosis not present

## 2021-02-12 DIAGNOSIS — R293 Abnormal posture: Secondary | ICD-10-CM

## 2021-02-12 DIAGNOSIS — M542 Cervicalgia: Secondary | ICD-10-CM | POA: Diagnosis not present

## 2021-02-12 DIAGNOSIS — M5412 Radiculopathy, cervical region: Secondary | ICD-10-CM

## 2021-02-12 DIAGNOSIS — G4486 Cervicogenic headache: Secondary | ICD-10-CM | POA: Diagnosis not present

## 2021-02-12 NOTE — Therapy (Signed)
[facility] [place] [address]  [place], [place], [address] Phone: [phone]   Fax:  [phone]  Physical Therapy Treatment  Patient Details  Name: Sophia [name] MRN: [id number] Date of Birth: 09/09/1940 Referring Provider (PT): Mosie Lukes, MD   Encounter Date: 02/12/2021   PT End of Session - 02/12/21 1539     Visit Number 4    Number of Visits 13    Date for PT Re-Evaluation 03/19/21    Authorization Type [employer]    Authorization Time Period 02/05/21 - 03/19/21    Authorization - Number of Visits 3    Progress Note Due on Visit 4    PT Start Time 1449    PT Stop Time 1530    PT Time Calculation (min) 41 min    Activity Tolerance Patient tolerated treatment well    Behavior During Therapy [facility] for tasks assessed/performed             Past Medical History:  Diagnosis Date   Anxiety and depression    Arthritis    hands, knees, etc   Bladder prolapse, female, acquired 09/16/2014   Bowen's disease    Diverticulitis    Diverticulosis    GERD (gastroesophageal reflux disease)    H/O hematuria    for several years, work only revealed kidney stone on right side   History of chicken pox    History of kidney stones    Hyperlipidemia    Hypertension    Leg cramps, sleep related 04/02/2016   Myalgia 04/25/2017   Osteopenia 06/26/2014   Osteoporosis 06/26/2014   Plantar wart of left foot 10/15/2016   Right shoulder pain 04/23/2017   SCC (squamous cell carcinoma)    arms   Statin intolerance 09/11/2019    Past Surgical History:  Procedure Laterality Date   CHOLECYSTECTOMY  2005   LOOP RECORDER INSERTION N/A 04/15/2018   Procedure: LOOP RECORDER INSERTION;  Surgeon: Sanda Klein, MD;  Location: [facility] [facility];  Service: Cardiovascular;  Laterality: N/A;   SKIN BIOPSY     multiple    There were no vitals filed for this visit.   Subjective Assessment - 02/12/21 1452     Subjective  Feels great today, whatever we did last time really worked. No pain at all today.    Diagnostic tests 01/23/21 CT head & neck: Negative CT head   Cervical spondylosis most prominent C5-6. Severe left foraminal stenosis C5-6.    Patient Stated Goals "to have less pain and not have to have surgery on my neck"    Currently in Pain? No/denies                               [facility] Adult PT Treatment/Exercise - 02/12/21 0001       Exercises   Exercises Neck;Lumbar      Neck Exercises: Machines for Strengthening   Nustep L3x76mn      Neck Exercises: Standing   Neck Retraction 10 reps;3 secs    Neck Retraction Limitations with towel on wall    Upper Extremity Flexion with Stabilization Flexion;5 reps    UE Flexion with Stabilization Limitations 5" holds      Neck Exercises: Seated   Cervical Isometrics --    Cervical Isometrics Limitations --      Lumbar Exercises: Standing   Other Standing Lumbar Exercises trunk rotations with red weight  ball 10 rep each way      Lumbar Exercises: Sidelying   Other Sidelying Lumbar Exercises R/L open books 10x2"      Manual Therapy   Manual Therapy Soft tissue mobilization;Myofascial release;Manual Traction    Soft tissue mobilization STM/DTM to B cervical paraspinals, suboccipitals, LT UT, LS, SCM    Myofascial Release B subocciptal release; pin & stretch to L UT    Manual Traction gentle manual cervical distraction with prolonged holds                      PT Short Term Goals - 02/12/21 1532       PT SHORT TERM GOAL #1   Title Patient will be independent with initial HEP    Status Achieved    Target Date 02/19/21      PT SHORT TERM GOAL #2   Title Patient will verbalize/demonstrate good awareness of neutral spine posture and proper body mechanics for daily tasks    Status On-going   reviewed postural alignment with patient   Target Date 02/19/21               PT Long Term Goals - 02/12/21 1539        PT LONG TERM GOAL #1   Title Patient will be independent with ongoing/advanced HEP for self-management at home in order to build upon functional gains in therapy    Status Partially Met   met for intial HEP     PT LONG TERM GOAL #2   Title Patient to demonstrate ability to achieve and maintain good spinal alignment/posturing    Status On-going      PT LONG TERM GOAL #3   Title Patient to improve cervical AROM to [facility] without pain provocation    Status On-going      PT LONG TERM GOAL #4   Title Patient to report >/= 50-75% reduction in frequency and intensity of headaches and/or L UE radiculopathy    Status On-going      PT LONG TERM GOAL #5   Title Patient to report ability to perform ADLs, household, volunteer and gardening related tasks without increased pain    Status On-going      PT LONG TERM GOAL #6   Title Patient will report no limitation with ability to turn her head while driving due to neck pain or stiffness    Status On-going                   Plan - 02/12/21 1540     Clinical Impression Statement Pt came in reporting much improvement from the interventions last session. Still palpated tautness and ttp along the suboocipitals and cervical area but good response from [employer] thoracic mobility exercises and cervical stabilization to improve overall mobility. Talked about postural alignment with her due to observing her rounded shoulder posture. Post session she did demonstrates increased awareness of postural alignment. She reported no concerns with HEP and declined the need to progress exercises as of now. Pt responded well.    Personal Factors and Comorbidities Age;Comorbidity 3+;Past/Current Experience;Social Background;Time since onset of injury/illness/exacerbation    Comorbidities HTN, a-fib, NSVT, near syncope, GERD, diverticulosis, OA, osteoporosis, chronic LBP w/o sciatica, chronic hip pain, Raynaud phenomenon, sleep related leg cramps    PT  Frequency 2x / week    PT Duration 6 weeks    PT Treatment/Interventions ADLs/Self Care Home Management;Electrical Stimulation;Iontophoresis 48m/ml Dexamethasone;Moist Heat;Traction;Ultrasound;Functional mobility training;Therapeutic activities;Therapeutic exercise;Neuromuscular re-education;Patient/family  education;Manual techniques;Passive range of motion;Dry needling;Taping    PT Next Visit Plan manual therapy to cervical musculature including possible DN as indicated; postural stretching and strengthening; modalities PRN; posture & body mechanics education    PT Home Exercise Plan Access Code: [email or web address] (8/3, updated 8/10)    Consulted and Agree with Plan of Care Patient             Patient will benefit from skilled therapeutic intervention in order to improve the following deficits and impairments:  Decreased activity tolerance, Decreased knowledge of precautions, Decreased mobility, Decreased range of motion, Decreased safety awareness, Decreased strength, Hypomobility, Increased fascial restricitons, Increased muscle spasms, Impaired perceived functional ability, Impaired flexibility, Impaired UE functional use, Improper body mechanics, Postural dysfunction, Pain  Visit Diagnosis: Cervicalgia  Radiculopathy, cervical region  Cervicogenic headache  Abnormal posture  Other symptoms and signs involving the musculoskeletal system     Problem List Patient Active Problem List   Diagnosis Date Noted   Nonintractable headache 01/14/2021   Allergic drug reaction 03/26/2020   Vitamin D deficiency 12/28/2019   Complex renal cyst 12/28/2019   Elevated liver function tests 12/22/2019   Bug bite 12/22/2019   Persistent atrial fibrillation ([place]) 11/13/2019   Secondary hypercoagulable state ([place]) 11/13/2019   Encounter for loop recorder check 10/20/2019   Long term (current) use of anticoagulants 10/20/2019   Chronic bilateral low back pain without sciatica 09/24/2019   Statin  intolerance 09/11/2019   Diarrhea 08/08/2018   Raynaud phenomenon 08/08/2018   Pulsatile tinnitus 04/26/2018   Hyperglycemia 04/26/2018   Cervicalgia 04/26/2018   Near syncope 04/15/2018   NSVT (nonsustained ventricular tachycardia) ([place]) 04/14/2018   Palpitations 02/25/2018   Bilateral carotid bruits 02/25/2018   Myalgia 04/25/2017   Left shoulder pain 04/23/2017   Plantar wart of left foot 10/15/2016   Leg cramps, sleep related 04/02/2016   Hip pain 05/29/2015   Preventative health care 03/24/2015   Bladder prolapse, female, acquired 09/16/2014   Osteoporosis 06/26/2014   Arthritis    GERD (gastroesophageal reflux disease)    Hypertension    Hyperlipidemia    History of kidney stones    H/O hematuria    Diverticulosis    History of chicken pox    Anxiety and depression    SCC (squamous cell carcinoma)    Bowen's disease     Artist Pais, PTA 02/12/2021, 3:49 PM  [facility] [address]  [place], [place], [address] Phone: [phone]   Fax:  [phone]  Name: Sophia [name] MRN: [id number] Date of Birth: April 09, 1941

## 2021-02-13 ENCOUNTER — Telehealth: Payer: Self-pay | Admitting: Cardiovascular Disease

## 2021-02-13 MED ORDER — APIXABAN 5 MG PO TABS: ORAL_TABLET | ORAL | 1 refills | Status: DC

## 2021-02-13 NOTE — Telephone Encounter (Signed)
*  STAT* If patient is at the pharmacy, call can be transferred to refill team.   1. Which medications need to be refilled? (please list name of each medication and dose if known) ELIQUIS 5 MG TABS tablet  2. Which pharmacy/location (including street and city if local pharmacy) is medication to be sent to? [place], [place] [facility] [place]  3. Do they need a 30 day or 90 day supply? 30 day  Patient has been out since Saturday.

## 2021-02-13 NOTE — Telephone Encounter (Signed)
Prescription refill request for Eliquis received. Indication:afib Last office visit:[place] 08/30/20 Scr:0.84 01/14/21 Age: 16fWeight:63.1kg

## 2021-02-13 NOTE — Progress Notes (Signed)
Carelink Summary Report / Loop Recorder 

## 2021-02-17 ENCOUNTER — Ambulatory Visit: Payer: Medicare HMO | Admitting: Physical Therapy

## 2021-02-17 ENCOUNTER — Encounter: Payer: Self-pay | Admitting: Physical Therapy

## 2021-02-17 ENCOUNTER — Other Ambulatory Visit: Payer: Self-pay

## 2021-02-17 DIAGNOSIS — M5412 Radiculopathy, cervical region: Secondary | ICD-10-CM | POA: Diagnosis not present

## 2021-02-17 DIAGNOSIS — M5481 Occipital neuralgia: Secondary | ICD-10-CM | POA: Diagnosis not present

## 2021-02-17 DIAGNOSIS — M542 Cervicalgia: Secondary | ICD-10-CM | POA: Diagnosis not present

## 2021-02-17 DIAGNOSIS — R29898 Other symptoms and signs involving the musculoskeletal system: Secondary | ICD-10-CM

## 2021-02-17 DIAGNOSIS — R293 Abnormal posture: Secondary | ICD-10-CM | POA: Diagnosis not present

## 2021-02-17 DIAGNOSIS — I10 Essential (primary) hypertension: Secondary | ICD-10-CM | POA: Diagnosis not present

## 2021-02-17 DIAGNOSIS — G4486 Cervicogenic headache: Secondary | ICD-10-CM

## 2021-02-17 DIAGNOSIS — M47812 Spondylosis without myelopathy or radiculopathy, cervical region: Secondary | ICD-10-CM | POA: Diagnosis not present

## 2021-02-17 DIAGNOSIS — Z6826 Body mass index (BMI) 26.0-26.9, adult: Secondary | ICD-10-CM | POA: Diagnosis not present

## 2021-02-17 NOTE — Patient Instructions (Addendum)
    Access Code: [email or web address] URL: [email or web address] Date: 02/17/2021 Prepared by: Annie Paras  Exercises Seated Cervical Retraction - 2-3 x daily - 7 x weekly - 2 sets - 10 reps - 3-5 sec hold Seated Gentle Upper Trapezius Stretch - 2-3 x daily - 7 x weekly - 3 reps - 30 sec hold Gentle Levator Scapulae Stretch - 2-3 x daily - 7 x weekly - 3 reps - 30 sec hold Doorway Pec Stretch at 60 Elevation - 2-3 x daily - 7 x weekly - 3 reps - 30 sec hold Doorway Pec Stretch at 90 Degrees Abduction - 2-3 x daily - 7 x weekly - 3 reps - 30 sec hold Seated Scapular Retraction - 2-3 x daily - 7 x weekly - 2 sets - 10 reps - 5 sec hold Mid-Lower Cervical Extension SNAG with Strap - 1-2 x daily - 7 x weekly - 2 sets - 10 reps - 3 sec hold Seated Assisted Cervical Rotation with Towel - 1-2 x daily - 7 x weekly - 2 sets - 10 reps - 3 sec hold Standing Bilateral Low Shoulder Row with Anchored Resistance - 1 x daily - 7 x weekly - 2 sets - 10 reps - 3-5 sec hold Scapular Retraction with Resistance Advanced - 1 x daily - 7 x weekly - 2 sets - 10 reps - 3-5 sec hold Standing Shoulder Horizontal Abduction with Resistance - 1 x daily - 7 x weekly - 2 sets - 10 reps - 3 sec hold Standing Shoulder Diagonal Horizontal Abduction 60/120 Degrees with Resistance - 1 x daily - 7 x weekly - 2 sets - 10 reps - 3 sec hold Shoulder External Rotation and Scapular Retraction with Resistance - 1 x daily - 7 x weekly - 2 sets - 10 reps - 3 sec hold Doorway Rhomboid Stretch - 2 x daily - 7 x weekly - 3 reps - 30sec hold Seated Rhomboid Stretch - 1 x daily - 7 x weekly - 3 reps - 30 sec hold

## 2021-02-17 NOTE — Therapy (Signed)
[facility] [place] [facility] [address]  [address] [place], [place], [address] Phone: [phone]   Fax:  [phone]  Physical Therapy Treatment  Patient Details  Name: Sophia [name] MRN: [id number] Date of Birth: 01/03/1941 Referring Provider (PT): Stacey A Blyth, MD   Encounter Date: 02/17/2021   PT End of Session - 02/17/21 1315     Visit Number 5    Number of Visits 13    Date for PT Re-Evaluation 03/19/21    Authorization Type [employer]    Authorization Time Period 02/05/21 - 03/19/21    Authorization - Number of Visits 4    Progress Note Due on Visit 4    PT Start Time 1315    PT Stop Time 1400    PT Time Calculation (min) 45 min    Activity Tolerance Patient tolerated treatment well    Behavior During Therapy WFL for tasks assessed/performed             Past Medical History:  Diagnosis Date   Anxiety and depression    Arthritis    hands, knees, etc   Bladder prolapse, female, acquired 09/16/2014   Bowen's disease    Diverticulitis    Diverticulosis    GERD (gastroesophageal reflux disease)    H/O hematuria    for several years, work only revealed kidney stone on right side   History of chicken pox    History of kidney stones    Hyperlipidemia    Hypertension    Leg cramps, sleep related 04/02/2016   Myalgia 04/25/2017   Osteopenia 06/26/2014   Osteoporosis 06/26/2014   Plantar wart of left foot 10/15/2016   Right shoulder pain 04/23/2017   SCC (squamous cell carcinoma)    arms   Statin intolerance 09/11/2019    Past Surgical History:  Procedure Laterality Date   CHOLECYSTECTOMY  2005   LOOP RECORDER INSERTION N/A 04/15/2018   Procedure: LOOP RECORDER INSERTION;  Surgeon: Croitoru, Mihai, MD;  Location: [facility];  Service: Cardiovascular;  Laterality: N/A;   SKIN BIOPSY     multiple    There were no vitals filed for this visit.   Subjective Assessment - 02/17/21 1322     Subjective  Pt still notes some mild headaches a few times today - currently just enough to remind her that they are still there.    Diagnostic tests 01/23/21 CT head & neck: Negative CT head   Cervical spondylosis most prominent C5-6. Severe left foraminal stenosis C5-6.    Patient Stated Goals "to have less pain and not have to have surgery on my neck"    Currently in Pain? Yes    Pain Score 3    2-3/10   Pain Location Head    Pain Orientation Left    Pain Descriptors / Indicators Aching    Pain Type Chronic pain                               [facility] Adult PT Treatment/Exercise - 02/17/21 1315       Neck Exercises: Machines for Strengthening   Nustep L4 x 6 min (UE/LE)      Neck Exercises: Theraband   Shoulder Extension 10 reps;Red    Shoulder Extension Limitations standing at doorframe - cues for scap retraction & depression    Rows 10 reps;Red    Rows Limitations standing at doorframe - cues for   scap retraction & depression    Shoulder External Rotation 10 reps;Red   2 sets - 1 each supine & standing   Shoulder External Rotation Limitations hooklying over towel roll & standing with back against doorframe; cues for scap retraction    Horizontal ABduction 10 reps;Red   2 sets - 1 each supine & standing   Horizontal ABduction Limitations hooklying over towel roll & standing with back against doorframe; cues for scap retraction    Other Theraband Exercises Alt red TB UE diagonals 10 x 3"; hooklying over towel roll & standing with back against doorframe; cues for scap retraction   2 sets - 1 each supine & standing     Neck Exercises: Stretches   Other Neck Stretches B & single arm rhomboid stretches x 30 sec each      Manual Therapy   Manual Therapy Soft tissue mobilization;Myofascial release;Manual Traction    Soft tissue mobilization STM/DTM to B cervical paraspinals, suboccipitals, LT UT, LS, scalenes, and rhomboids    Myofascial Release B subocciptal release    Manual  Traction gentle manual cervical distraction with prolonged holds                    PT Education - 02/17/21 1528     Education Details HEP update - scapular strengtening progression, rhomboid stretches - Access Code: [email or web address]    Person(s) Educated Patient    Methods Explanation;Demonstration;Verbal cues;Handout    Comprehension Verbalized understanding;Verbal cues required;Returned demonstration;Need further instruction              PT Short Term Goals - 02/12/21 1532       PT SHORT TERM GOAL #1   Title Patient will be independent with initial HEP    Status Achieved    Target Date 02/19/21      PT SHORT TERM GOAL #2   Title Patient will verbalize/demonstrate good awareness of neutral spine posture and proper body mechanics for daily tasks    Status On-going   reviewed postural alignment with patient   Target Date 02/19/21               PT Long Term Goals - 02/12/21 1539       PT LONG TERM GOAL #1   Title Patient will be independent with ongoing/advanced HEP for self-management at home in order to build upon functional gains in therapy    Status Partially Met   met for intial HEP     PT LONG TERM GOAL #2   Title Patient to demonstrate ability to achieve and maintain good spinal alignment/posturing    Status On-going      PT LONG TERM GOAL #3   Title Patient to improve cervical AROM to [facility] without pain provocation    Status On-going      PT LONG TERM GOAL #4   Title Patient to report >/= 50-75% reduction in frequency and intensity of headaches and/or L UE radiculopathy    Status On-going      PT LONG TERM GOAL #5   Title Patient to report ability to perform ADLs, household, volunteer and gardening related tasks without increased pain    Status On-going      PT LONG TERM GOAL #6   Title Patient will report no limitation with ability to turn her head while driving due to neck pain or stiffness    Status On-going  Plan -  02/17/21 1530     Clinical Impression Statement Sophia [name] reports she has been diligent with the HEP and feels that PT is helping her neck pain and ROM, but she still has headaches a few times a day. She reports the surgeon told her that the headaches are not likely coming from her neck but are more likely a form of neuropathy - he wants to refer her to someone for an injection to block the nerve, but she wants to finish with the PT first as she feels that it is helping. She continues to note benefit from manual therapy with decreasing muscle tension/tightness noted allowing for improved ROM. She is tolerating exercise progression well with advancement of theraband resistance and progression to more upright strengthening with good scapular activation. Sophia [name] will continue to benefit from skilled PT to further normalize cervicothoracic muscle tension, maximize functional cervical ROM and postural control/strength.    Comorbidities HTN, a-fib, NSVT, near syncope, GERD, diverticulosis, OA, osteoporosis, chronic LBP w/o sciatica, chronic hip pain, Raynaud phenomenon, sleep related leg cramps    Rehab Potential Good    PT Frequency 2x / week    PT Duration 6 weeks    PT Treatment/Interventions ADLs/Self Care Home Management;Electrical Stimulation;Iontophoresis 4mg/ml Dexamethasone;Moist Heat;Traction;Ultrasound;Functional mobility training;Therapeutic activities;Therapeutic exercise;Neuromuscular re-education;Patient/family education;Manual techniques;Passive range of motion;Dry needling;Taping    PT Next Visit Plan update [employer] authorization; manual therapy to cervical musculature including possible DN as indicated; postural stretching and strengthening; modalities PRN; posture & body mechanics education    PT Home Exercise Plan Access Code: [email or web address] (8/3, updated 8/10 & 8/22)    Consulted and Agree with Plan of Care Patient             Patient will benefit from skilled therapeutic intervention in order  to improve the following deficits and impairments:  Decreased activity tolerance, Decreased knowledge of precautions, Decreased mobility, Decreased range of motion, Decreased safety awareness, Decreased strength, Hypomobility, Increased fascial restricitons, Increased muscle spasms, Impaired perceived functional ability, Impaired flexibility, Impaired UE functional use, Improper body mechanics, Postural dysfunction, Pain  Visit Diagnosis: Cervicalgia  Radiculopathy, cervical region  Cervicogenic headache  Abnormal posture  Other symptoms and signs involving the musculoskeletal system     Problem List Patient Active Problem List   Diagnosis Date Noted   Nonintractable headache 01/14/2021   Allergic drug reaction 03/26/2020   Vitamin D deficiency 12/28/2019   Complex renal cyst 12/28/2019   Elevated liver function tests 12/22/2019   Bug bite 12/22/2019   Persistent atrial fibrillation ([facility]) 11/13/2019   Secondary hypercoagulable state ([facility]) 11/13/2019   Encounter for loop recorder check 10/20/2019   Long term (current) use of anticoagulants 10/20/2019   Chronic bilateral low back pain without sciatica 09/24/2019   Statin intolerance 09/11/2019   Diarrhea 08/08/2018   Raynaud phenomenon 08/08/2018   Pulsatile tinnitus 04/26/2018   Hyperglycemia 04/26/2018   Cervicalgia 04/26/2018   Near syncope 04/15/2018   NSVT (nonsustained ventricular tachycardia) ([facility]) 04/14/2018   Palpitations 02/25/2018   Bilateral carotid bruits 02/25/2018   Myalgia 04/25/2017   Left shoulder pain 04/23/2017   Plantar wart of left foot 10/15/2016   Leg cramps, sleep related 04/02/2016   Hip pain 05/29/2015   Preventative health care 03/24/2015   Bladder prolapse, female, acquired 09/16/2014   Osteoporosis 06/26/2014   Arthritis    GERD (gastroesophageal reflux disease)    Hypertension    Hyperlipidemia    History of kidney stones    H/O hematuria    Diverticulosis      History of chicken pox     Anxiety and depression    SCC (squamous cell carcinoma)    Bowen's disease     Percival Spanish, PT, MPT 02/17/2021, 8:20 PM  [facility] [address]  [place], [place], [address] Phone: [phone]   Fax:  [phone]  Name: Sophia [name] MRN: [id number] Date of Birth: Nov 20, 1940

## 2021-02-19 ENCOUNTER — Ambulatory Visit: Payer: Medicare HMO

## 2021-02-19 ENCOUNTER — Other Ambulatory Visit: Payer: Self-pay

## 2021-02-19 DIAGNOSIS — G4486 Cervicogenic headache: Secondary | ICD-10-CM

## 2021-02-19 DIAGNOSIS — R293 Abnormal posture: Secondary | ICD-10-CM | POA: Diagnosis not present

## 2021-02-19 DIAGNOSIS — R29898 Other symptoms and signs involving the musculoskeletal system: Secondary | ICD-10-CM | POA: Diagnosis not present

## 2021-02-19 DIAGNOSIS — M542 Cervicalgia: Secondary | ICD-10-CM

## 2021-02-19 DIAGNOSIS — M5412 Radiculopathy, cervical region: Secondary | ICD-10-CM | POA: Diagnosis not present

## 2021-02-19 NOTE — Therapy (Signed)
[facility] [address]  [place], [place], [address] Phone: [phone]   Fax:  [phone]  Physical Therapy Treatment  Patient Details  Name: Sophia [name] MRN: [id number] Date of Birth: 03/24/1941 Referring Provider (PT): Mosie Lukes, MD   Encounter Date: 02/19/2021   PT End of Session - 02/19/21 1319     Visit Number 6    Number of Visits 13    Date for PT Re-Evaluation 03/19/21    Authorization Type [employer]    Authorization Time Period 02/19/21 - 03/19/21    Authorization - Visit Number 1    Authorization - Number of Visits 8    PT Start Time 7711    PT Stop Time 6579    PT Time Calculation (min) 43 min    Activity Tolerance Patient tolerated treatment well    Behavior During Therapy [facility] for tasks assessed/performed             Past Medical History:  Diagnosis Date   Anxiety and depression    Arthritis    hands, knees, etc   Bladder prolapse, female, acquired 09/16/2014   Bowen's disease    Diverticulitis    Diverticulosis    GERD (gastroesophageal reflux disease)    H/O hematuria    for several years, work only revealed kidney stone on right side   History of chicken pox    History of kidney stones    Hyperlipidemia    Hypertension    Leg cramps, sleep related 04/02/2016   Myalgia 04/25/2017   Osteopenia 06/26/2014   Osteoporosis 06/26/2014   Plantar wart of left foot 10/15/2016   Right shoulder pain 04/23/2017   SCC (squamous cell carcinoma)    arms   Statin intolerance 09/11/2019    Past Surgical History:  Procedure Laterality Date   CHOLECYSTECTOMY  2005   LOOP RECORDER INSERTION N/A 04/15/2018   Procedure: LOOP RECORDER INSERTION;  Surgeon: Sanda Klein, MD;  Location: [facility];  Service: Cardiovascular;  Laterality: N/A;   SKIN BIOPSY     multiple    There were no vitals filed for this visit.   Subjective Assessment - 02/19/21 1318     Subjective  I have less of a headache today, I want to continue with PT because it is really helping.    Diagnostic tests 01/23/21 CT head & neck: Negative CT head   Cervical spondylosis most prominent C5-6. Severe left foraminal stenosis C5-6.    Patient Stated Goals "to have less pain and not have to have surgery on my neck"    Currently in Pain? No/denies                               [facility] Adult PT Treatment/Exercise - 02/19/21 0001       Exercises   Exercises Neck;Lumbar      Neck Exercises: Machines for Strengthening   UBE (Upper Arm Bike) L1.0 x 6 min (3' fwd/3' min back)      Neck Exercises: Theraband   Shoulder External Rotation 20 reps;Red    Shoulder External Rotation Limitations seated    Horizontal ABduction 20 reps;Red    Horizontal ABduction Limitations seated      Neck Exercises: Standing   Other Standing Exercises B shoulder flexion and abduction with 1# weight; 10 reps each; cues to keep posterior shoulder engaged  Lumbar Exercises: Seated   Other Seated Lumbar Exercises R/L trunk rotation 10 reps with red TB    Other Seated Lumbar Exercises rollouts with orange pball 3 way, 5x5"      Manual Therapy   Manual Therapy Soft tissue mobilization;Myofascial release    Soft tissue mobilization STM to UT, LS, suboccipitals    Myofascial Release manual TPR to UT, LS                      PT Short Term Goals - 02/12/21 1532       PT SHORT TERM GOAL #1   Title Patient will be independent with initial HEP    Status Achieved    Target Date 02/19/21      PT SHORT TERM GOAL #2   Title Patient will verbalize/demonstrate good awareness of neutral spine posture and proper body mechanics for daily tasks    Status On-going   reviewed postural alignment with patient   Target Date 02/19/21               PT Long Term Goals - 02/12/21 1539       PT LONG TERM GOAL #1   Title Patient will be independent with ongoing/advanced HEP for  self-management at home in order to build upon functional gains in therapy    Status Partially Met   met for intial HEP     PT LONG TERM GOAL #2   Title Patient to demonstrate ability to achieve and maintain good spinal alignment/posturing    Status On-going      PT LONG TERM GOAL #3   Title Patient to improve cervical AROM to [facility] without pain provocation    Status On-going      PT LONG TERM GOAL #4   Title Patient to report >/= 50-75% reduction in frequency and intensity of headaches and/or L UE radiculopathy    Status On-going      PT LONG TERM GOAL #5   Title Patient to report ability to perform ADLs, household, volunteer and gardening related tasks without increased pain    Status On-going      PT LONG TERM GOAL #6   Title Patient will report no limitation with ability to turn her head while driving due to neck pain or stiffness    Status On-going                   Plan - 02/19/21 1401     Clinical Impression Statement Pt notes improvements with less headaches today and increasing mobility along her neck. She required instruction and cues to fully engage the periscap muscles with scap stab exercises. Also instruction given with trunk rotations to follow with neck to increase stretch. She still is demonstrating lots of ttp and tautness along her B UT,LS, and suboccipitals. I briefly mentioned DN to her and will monitor if she wants to continue with it in the future. We were able to increase repetitions with exercises today and she may be ready to increase resistance next visit. No concerns post session.    Personal Factors and Comorbidities Age;Comorbidity 3+;Past/Current Experience;Social Background;Time since onset of injury/illness/exacerbation    Comorbidities HTN, a-fib, NSVT, near syncope, GERD, diverticulosis, OA, osteoporosis, chronic LBP w/o sciatica, chronic hip pain, Raynaud phenomenon, sleep related leg cramps    PT Frequency 2x / week    PT Duration 6 weeks     PT Treatment/Interventions ADLs/Self Care Home Management;Electrical Stimulation;Iontophoresis 41m/ml Dexamethasone;Moist Heat;Traction;Ultrasound;Functional mobility  training;Therapeutic activities;Therapeutic exercise;Neuromuscular re-education;Patient/family education;Manual techniques;Passive range of motion;Dry needling;Taping    PT Next Visit Plan manual therapy to cervical musculature including possible DN as indicated; postural stretching and strengthening; modalities PRN; posture & body mechanics education    PT Home Exercise Plan Access Code: [email or web address] (8/3, updated 8/10 & 8/22)    Consulted and Agree with Plan of Care Patient             Patient will benefit from skilled therapeutic intervention in order to improve the following deficits and impairments:  Decreased activity tolerance, Decreased knowledge of precautions, Decreased mobility, Decreased range of motion, Decreased safety awareness, Decreased strength, Hypomobility, Increased fascial restricitons, Increased muscle spasms, Impaired perceived functional ability, Impaired flexibility, Impaired UE functional use, Improper body mechanics, Postural dysfunction, Pain  Visit Diagnosis: Cervicalgia  Radiculopathy, cervical region  Cervicogenic headache  Abnormal posture  Other symptoms and signs involving the musculoskeletal system     Problem List Patient Active Problem List   Diagnosis Date Noted   Nonintractable headache 01/14/2021   Allergic drug reaction 03/26/2020   Vitamin D deficiency 12/28/2019   Complex renal cyst 12/28/2019   Elevated liver function tests 12/22/2019   Bug bite 12/22/2019   Persistent atrial fibrillation ([place]) 11/13/2019   Secondary hypercoagulable state ([place]) 11/13/2019   Encounter for loop recorder check 10/20/2019   Long term (current) use of anticoagulants 10/20/2019   Chronic bilateral low back pain without sciatica 09/24/2019   Statin intolerance 09/11/2019   Diarrhea 08/08/2018    Raynaud phenomenon 08/08/2018   Pulsatile tinnitus 04/26/2018   Hyperglycemia 04/26/2018   Cervicalgia 04/26/2018   Near syncope 04/15/2018   NSVT (nonsustained ventricular tachycardia) ([place]) 04/14/2018   Palpitations 02/25/2018   Bilateral carotid bruits 02/25/2018   Myalgia 04/25/2017   Left shoulder pain 04/23/2017   Plantar wart of left foot 10/15/2016   Leg cramps, sleep related 04/02/2016   Hip pain 05/29/2015   Preventative health care 03/24/2015   Bladder prolapse, female, acquired 09/16/2014   Osteoporosis 06/26/2014   Arthritis    GERD (gastroesophageal reflux disease)    Hypertension    Hyperlipidemia    History of kidney stones    H/O hematuria    Diverticulosis    History of chicken pox    Anxiety and depression    SCC (squamous cell carcinoma)    Bowen's disease     Artist Pais, PTA 02/19/2021, 2:33 PM  [facility] [address]  [place], [place], [address] Phone: [phone]   Fax:  [phone]  Name: Sophia [name] MRN: [id number] Date of Birth: 03-Mar-1941

## 2021-02-24 ENCOUNTER — Other Ambulatory Visit: Payer: Self-pay

## 2021-02-24 ENCOUNTER — Ambulatory Visit: Payer: Medicare HMO

## 2021-02-24 DIAGNOSIS — G4486 Cervicogenic headache: Secondary | ICD-10-CM

## 2021-02-24 DIAGNOSIS — M5412 Radiculopathy, cervical region: Secondary | ICD-10-CM

## 2021-02-24 DIAGNOSIS — R29898 Other symptoms and signs involving the musculoskeletal system: Secondary | ICD-10-CM | POA: Diagnosis not present

## 2021-02-24 DIAGNOSIS — M542 Cervicalgia: Secondary | ICD-10-CM

## 2021-02-24 DIAGNOSIS — R293 Abnormal posture: Secondary | ICD-10-CM

## 2021-02-24 NOTE — Therapy (Signed)
[facility] [address]  [place], [place], [address] Phone: [phone]   Fax:  [phone]  Physical Therapy Treatment  Patient Details  Name: Sophia [name] MRN: [id number] Date of Birth: [birth year] Referring Provider (PT): Mosie Lukes, MD   Encounter Date: 02/24/2021   PT End of Session - 02/24/21 1400     Visit Number 7    Number of Visits 13    Date for PT Re-Evaluation 03/19/21    Authorization Type [employer]    Authorization Time Period 02/19/21 - 03/19/21    Authorization - Visit Number 2    Authorization - Number of Visits 8    Progress Note Due on Visit 14    PT Start Time 1315    PT Stop Time 1400    PT Time Calculation (min) 45 min    Activity Tolerance Patient tolerated treatment well    Behavior During Therapy [facility] for tasks assessed/performed             Past Medical History:  Diagnosis Date   Anxiety and depression    Arthritis    hands, knees, etc   Bladder prolapse, female, acquired 09/16/2014   Bowen's disease    Diverticulitis    Diverticulosis    GERD (gastroesophageal reflux disease)    H/O hematuria    for several years, work only revealed kidney stone on right side   History of chicken pox    History of kidney stones    Hyperlipidemia    Hypertension    Leg cramps, sleep related 04/02/2016   Myalgia 04/25/2017   Osteopenia 06/26/2014   Osteoporosis 06/26/2014   Plantar wart of left foot 10/15/2016   Right shoulder pain 04/23/2017   SCC (squamous cell carcinoma)    arms   Statin intolerance 09/11/2019    Past Surgical History:  Procedure Laterality Date   CHOLECYSTECTOMY  2005   LOOP RECORDER INSERTION N/A 04/15/2018   Procedure: LOOP RECORDER INSERTION;  Surgeon: Sanda Klein, MD;  Location: [facility];  Service: Cardiovascular;  Laterality: N/A;   SKIN BIOPSY     multiple    There were no vitals filed for this visit.   Subjective  Assessment - 02/24/21 1318     Subjective Pt reports having a headache all day today, tried to do exercises but not much improvement.    Diagnostic tests 01/23/21 CT head & neck: Negative CT head   Cervical spondylosis most prominent C5-6. Severe left foraminal stenosis C5-6.    Patient Stated Goals "to have less pain and not have to have surgery on my neck"    Currently in Pain? Yes    Pain Score 5     Pain Location Head    Pain Orientation Left    Pain Descriptors / Indicators Aching    Pain Type Chronic pain                               [facility] Adult PT Treatment/Exercise - 02/24/21 0001       Self-Care   Self-Care Posture    Posture reviewed posture and body mechanics handout with copy given to patient      Exercises   Exercises Neck;Lumbar      Neck Exercises: Machines for Strengthening   UBE (Upper Arm Bike) L2.0 x 6 min (3' fwd/3' min back)      Neck  Exercises: Theraband   Shoulder Extension 10 reps;Red    Shoulder Extension Limitations standing    Horizontal ABduction 10 reps    Horizontal ABduction Limitations standing      Neck Exercises: Seated   Cervical Isometrics Extension;Right lateral flexion;Left lateral flexion;5 secs;10 reps    Cervical Isometrics Limitations with towel    Other Seated Exercise cervical extension with towel 10 reps                    PT Education - 02/24/21 1355     Education Details HEP update: Access Code: [email or web address]    Person(s) Educated Patient    Methods Explanation;Demonstration;Handout    Comprehension Verbalized understanding;Returned demonstration              PT Short Term Goals - 02/24/21 1334       PT SHORT TERM GOAL #1   Title Patient will be independent with initial HEP    Status Achieved    Target Date 02/19/21      PT SHORT TERM GOAL #2   Title Patient will verbalize/demonstrate good awareness of neutral spine posture and proper body mechanics for daily tasks    Status Achieved    reviewed postural alignment with patient   Target Date 02/19/21               PT Long Term Goals - 02/12/21 1539       PT LONG TERM GOAL #1   Title Patient will be independent with ongoing/advanced HEP for self-management at home in order to build upon functional gains in therapy    Status Partially Met   met for intial HEP     PT LONG TERM GOAL #2   Title Patient to demonstrate ability to achieve and maintain good spinal alignment/posturing    Status On-going      PT LONG TERM GOAL #3   Title Patient to improve cervical AROM to [facility] without pain provocation    Status On-going      PT LONG TERM GOAL #4   Title Patient to report >/= 50-75% reduction in frequency and intensity of headaches and/or L UE radiculopathy    Status On-going      PT LONG TERM GOAL #5   Title Patient to report ability to perform ADLs, household, volunteer and gardening related tasks without increased pain    Status On-going      PT LONG TERM GOAL #6   Title Patient will report no limitation with ability to turn her head while driving due to neck pain or stiffness    Status On-going                   Plan - 02/24/21 1402     Clinical Impression Statement Pt initial arrived with c/o headache pain today. She demonstrated good tolerance for cervical isometrics and added these to her HEP. Also provided handout for posture and body mechanics, briefly reviewed this with her. She is more aware of posture with better alignment shown during session but occassionally reverts back to slouched posture, overall STG 2 met. Provided instructions on fully engaging persicap muscles for improved posture. She reported a decrease in headache intensity post session, good response.    Personal Factors and Comorbidities Age;Comorbidity 3+;Past/Current Experience;Social Background;Time since onset of injury/illness/exacerbation    Comorbidities HTN, a-fib, NSVT, near syncope, GERD, diverticulosis, OA, osteoporosis,  chronic LBP w/o sciatica, chronic hip pain, Raynaud phenomenon, sleep related leg cramps  PT Frequency 2x / week    PT Duration 6 weeks    PT Treatment/Interventions ADLs/Self Care Home Management;Electrical Stimulation;Iontophoresis 20m/ml Dexamethasone;Moist Heat;Traction;Ultrasound;Functional mobility training;Therapeutic activities;Therapeutic exercise;Neuromuscular re-education;Patient/family education;Manual techniques;Passive range of motion;Dry needling;Taping    PT Next Visit Plan manual therapy to cervical musculature including possible DN as indicated; postural stretching and strengthening; modalities PRN; posture & body mechanics education    PT Home Exercise Plan Access Code: [email or web address] (8/3, updated 8/10 & 8/22)    Consulted and Agree with Plan of Care Patient             Patient will benefit from skilled therapeutic intervention in order to improve the following deficits and impairments:  Decreased activity tolerance, Decreased knowledge of precautions, Decreased mobility, Decreased range of motion, Decreased safety awareness, Decreased strength, Hypomobility, Increased fascial restricitons, Increased muscle spasms, Impaired perceived functional ability, Impaired flexibility, Impaired UE functional use, Improper body mechanics, Postural dysfunction, Pain  Visit Diagnosis: Cervicalgia  Radiculopathy, cervical region  Cervicogenic headache  Abnormal posture  Other symptoms and signs involving the musculoskeletal system     Problem List Patient Active Problem List   Diagnosis Date Noted   Nonintractable headache 01/14/2021   Allergic drug reaction 03/26/2020   Vitamin D deficiency 12/28/2019   Complex renal cyst 12/28/2019   Elevated liver function tests 12/22/2019   Bug bite 12/22/2019   [place]istent [place] fibrillation [place] 11/13/2019   [place] hypercoagulable [place] 11/13/2019   Encounter for loop recorder check 10/20/2019   Long term (current) use of  anticoagulants 10/20/2019   Chronic bilateral low back pain without sciatica 09/24/2019   Statin intolerance 09/11/2019   Diarrhea 08/08/2018   Raynaud phenomenon 08/08/2018   Pulsatile tinnitus 04/26/2018   Hyperglycemia 04/26/2018   Cervicalgia 04/26/2018   Near syncope 04/15/2018   [place] (nonsustained [place] tachycardia) ([place] 04/14/2018   Palpitations 02/25/2018   Bilateral carotid bruits 02/25/2018   Myalgia 04/25/2017   Left shoulder pain 04/23/2017   Plantar wart of left foot 10/15/2016   Leg cramps, sleep related 04/02/2016   Hip pain 05/29/2015   Preventative health care 03/24/2015   Bladder prolapse, female, acquired 09/16/2014   Osteoporosis 06/26/2014   Arthritis    GERD (gastroesophageal reflux disease)    Hypertension    Hyperlipidemia    History of kidney stones    H/O hematuria    Diverticulosis    History of chicken pox    Anxiety and depression    SCC (squamous cell carcinoma)    Bowen's disease     BArtist Pais PTA 02/24/2021, 2:32 PM  [facility] [place] [address]Phone: [phone]  Fax:  [phone] Name: SMAKENLEIGH CROWNOVERMRN: [id number] of Birth: 1May 01, 1942

## 2021-02-26 ENCOUNTER — Ambulatory Visit: Payer: Medicare HMO | Admitting: Physical Therapy

## 2021-02-26 ENCOUNTER — Encounter: Payer: Self-pay | Admitting: Physical Therapy

## 2021-02-26 ENCOUNTER — Other Ambulatory Visit: Payer: Self-pay

## 2021-02-26 DIAGNOSIS — G4486 Cervicogenic headache: Secondary | ICD-10-CM

## 2021-02-26 DIAGNOSIS — M542 Cervicalgia: Secondary | ICD-10-CM | POA: Diagnosis not present

## 2021-02-26 DIAGNOSIS — R29898 Other symptoms and signs involving the musculoskeletal system: Secondary | ICD-10-CM | POA: Diagnosis not present

## 2021-02-26 DIAGNOSIS — R293 Abnormal posture: Secondary | ICD-10-CM

## 2021-02-26 DIAGNOSIS — M5412 Radiculopathy, cervical region: Secondary | ICD-10-CM

## 2021-02-26 NOTE — Therapy (Signed)
[facility] [address]  [place], [place], [address] Phone: [phone]   Fax:  [phone]  Physical Therapy Treatment / Progress Note  Patient Details  Name: Sophia [name] MRN: [id number] Date of Birth: Oct 11, 1940 Referring Provider (PT): Mosie Lukes, MD  Progress Note  Reporting Period 01/29/2021 to 02/26/2021  See note below for Objective Data and Assessment of Progress/Goals.     Encounter Date: 02/26/2021   PT End of Session - 02/26/21 1056     Visit Number 8    Number of Visits 13    Date for PT Re-Evaluation 03/19/21    Authorization Type [employer]    Authorization Time Period 02/19/21 - 03/19/21    Authorization - Visit Number 3    Authorization - Number of Visits 8    Progress Note Due on Visit 13   PN completed on visit #8   PT Start Time 1056    PT Stop Time 1147    PT Time Calculation (min) 51 min    Activity Tolerance Patient tolerated treatment well    Behavior During Therapy WFL for tasks assessed/performed             Past Medical History:  Diagnosis Date   Anxiety and depression    Arthritis    hands, knees, etc   Bladder prolapse, female, acquired 09/16/2014   Bowen's disease    Diverticulitis    Diverticulosis    GERD (gastroesophageal reflux disease)    H/O hematuria    for several years, work only revealed kidney stone on right side   History of chicken pox    History of kidney stones    Hyperlipidemia    Hypertension    Leg cramps, sleep related 04/02/2016   Myalgia 04/25/2017   Osteopenia 06/26/2014   Osteoporosis 06/26/2014   Plantar wart of left foot 10/15/2016   Right shoulder pain 04/23/2017   SCC (squamous cell carcinoma)    arms   Statin intolerance 09/11/2019    Past Surgical History:  Procedure Laterality Date   CHOLECYSTECTOMY  2005   LOOP RECORDER INSERTION N/A 04/15/2018   Procedure: LOOP RECORDER INSERTION;  Surgeon: Sanda Klein, MD;   Location: [facility];  Service: Cardiovascular;  Laterality: N/A;   SKIN BIOPSY     multiple    There were no vitals filed for this visit.   Subjective Assessment - 02/26/21 1058     Subjective Pt noting only a slight headache which does not interfere with her ability to to what she wants to do.    Diagnostic tests 01/23/21 CT head & neck: Negative CT head   Cervical spondylosis most prominent C5-6. Severe left foraminal stenosis C5-6.    Patient Stated Goals "to have less pain and not have to have surgery on my neck"    Currently in Pain? Yes    Pain Score 4     Pain Location Head    Pain Orientation Left    Pain Descriptors / Indicators Headache    Pain Type Chronic pain    Pain Frequency Intermittent                [facility] PT Assessment - 02/26/21 1056       Assessment   Medical Diagnosis Cervicalgia with nonintractable headache    Referring Provider (PT) Mosie Lukes, MD    Onset Date/Surgical Date --   chronic - neck pain onset pre-COVID pandemic  with exacerbation into headaches ~6 months ago   Next MD Visit 04/17/21      AROM   Cervical Flexion 46   pulling   Cervical Extension 45    Cervical - Right Side Bend 31    Cervical - Left Side Bend 18   21 following MT   Cervical - Right Rotation 70    Cervical - Left Rotation 57                           [facility] Adult PT Treatment/Exercise - 02/26/21 1056       Exercises   Exercises Neck      Neck Exercises: Machines for Strengthening   UBE (Upper Arm Bike) L2.0 x 6 min (3' fwd/3' min back)      Neck Exercises: Stretches   Upper Trapezius Stretch Right;Left;2 reps;30 seconds    Upper Trapezius Stretch Limitations pulling down on towel over shoulder for shoulder & 1st rib depression    Levator Stretch Right;Left;2 reps;30 seconds    Levator Stretch Limitations pulling down on towel over shoulder for shoulder & 1st rib depression      Manual Therapy   Manual Therapy Joint  mobilization;Soft tissue mobilization;Myofascial release;Passive ROM;Manual Traction    Joint Mobilization cervical lateral glides & rotational NAGs    Soft tissue mobilization STM/DTM to B cervical paraspinals, suboccipitals, UT, LS & scalenes, as well as L teres group, infraspinatus & subscapularis    Myofascial Release B subocciptal release; manual TPR to B UT, LS & scalenes, as well as L teres group, infraspinatus & subscapularis    Passive ROM gentle cervical ROM & MWM as well as stretching all directions    Manual Traction gentle manual cervical distraction with prolonged holds                    PT Education - 02/26/21 1140     Education Details Role of DN in addressing abnormal muscle tension    Person(s) Educated Patient    Methods Explanation   email.   Comprehension Verbalized understanding              PT Short Term Goals - 02/26/21 1102       PT SHORT TERM GOAL #1   Title Patient will be independent with initial HEP    Status Achieved   02/12/21     PT SHORT TERM GOAL #2   Title Patient will verbalize/demonstrate good awareness of neutral spine posture and proper body mechanics for daily tasks    Status Achieved   02/24/21              PT Long Term Goals - 02/26/21 1103       PT LONG TERM GOAL #1   Title Patient will be independent with ongoing/advanced HEP for self-management at home in order to build upon functional gains in therapy    Status Partially Met   02/26/21 - met for current HEP   Target Date 03/19/21      PT LONG TERM GOAL #2   Title Patient to demonstrate ability to achieve and maintain good spinal alignment/posturing    Status Partially Met    Target Date 03/19/21      PT LONG TERM GOAL #3   Title Patient to improve cervical AROM to [facility] without pain provocation    Status Partially Met   02/26/21 - gains demonstrated in all motions   Target Date  03/19/21      PT LONG TERM GOAL #4   Title Patient to report >/= 50-75% reduction  in frequency and intensity of headaches and/or L UE radiculopathy    Status Partially Met   02/26/21 - Pt states frequency of headahes is "lots better" but unable to give a percentage of improvement. No recent L UE radiculopathy noted.   Target Date 03/19/21      PT LONG TERM GOAL #5   Title Patient to report ability to perform ADLs, household, volunteer and gardening related tasks without increased pain    Status Partially Met    Target Date 03/19/21      PT LONG TERM GOAL #6   Title Patient will report no limitation with ability to turn her head while driving due to neck pain or stiffness    Status Partially Met   02/26/21 - Pt reports improved ability to turn her head while driving   Target Date 03/19/21                   Plan - 02/26/21 1147     Clinical Impression Statement Aaminah feel that she is "lots better" (unable to give a percentage of improvement) with PT - she reports her headaches are less frequent and notes increased ease of turning her head while driving. Cervical ROM has improved in all planes with further gains noted following manual therapy today. She also notes reduction in HA intensity as well as increased ease of L shoulder elevation following MT. Overall, Sholonda is progressing well with PT with all STGs met and LTGs partially met. She will continue to benefit from skilled PT to promote further normalization of cervical and L shoulder ROM, further reduce frequency and intensity of headaches and improve postural stability to prevent recurrence of current problems. Given ongoing muscle tension and intermittent headaches, discussed possibility of DN in upcoming visits    Comorbidities HTN, a-fib, NSVT, near syncope, GERD, diverticulosis, OA, osteoporosis, chronic LBP w/o sciatica, chronic hip pain, Raynaud phenomenon, sleep related leg cramps    Rehab Potential Good    PT Frequency 2x / week    PT Duration 6 weeks    PT Treatment/Interventions ADLs/Self Care Home  Management;Electrical Stimulation;Iontophoresis 4mg /ml Dexamethasone;Moist Heat;Traction;Ultrasound;Functional mobility training;Therapeutic activities;Therapeutic exercise;Neuromuscular re-education;Patient/family education;Manual techniques;Passive range of motion;Dry needling;Taping    PT Next Visit Plan manual therapy to cervical musculature including possible DN as indicated; postural stretching and strengthening; modalities PRN; posture & body mechanics education    PT Home Exercise Plan Access Code: [email or web address] (8/3, updated 8/10 & 8/22), [email or web address] (8/29)    Consulted and Agree with Plan of Care Patient             Patient will benefit from skilled therapeutic intervention in order to improve the following deficits and impairments:  Decreased activity tolerance, Decreased knowledge of precautions, Decreased mobility, Decreased range of motion, Decreased safety awareness, Decreased strength, Hypomobility, Increased fascial restricitons, Increased muscle spasms, Impaired perceived functional ability, Impaired flexibility, Impaired UE functional use, Improper body mechanics, Postural dysfunction, Pain  Visit Diagnosis: Cervicalgia  Radiculopathy, cervical region  Cervicogenic headache  Abnormal posture  Other symptoms and signs involving the musculoskeletal system     Problem List Patient Active Problem List   Diagnosis Date Noted   Nonintractable headache 01/14/2021   Allergic drug reaction 03/26/2020   Vitamin D deficiency 12/28/2019   Complex renal cyst 12/28/2019   Elevated liver function tests 12/22/2019   Bug bite 12/22/2019   Persistent  atrial fibrillation ([place]) 11/13/2019   Secondary hypercoagulable state ([place]) 11/13/2019   Encounter for loop recorder check 10/20/2019   Long term (current) use of anticoagulants 10/20/2019   Chronic bilateral low back pain without sciatica 09/24/2019   Statin intolerance 09/11/2019   Diarrhea 08/08/2018   Raynaud phenomenon  08/08/2018   Pulsatile tinnitus 04/26/2018   Hyperglycemia 04/26/2018   Cervicalgia 04/26/2018   Near syncope 04/15/2018   NSVT (nonsustained ventricular tachycardia) ([place]) 04/14/2018   Palpitations 02/25/2018   Bilateral carotid bruits 02/25/2018   Myalgia 04/25/2017   Left shoulder pain 04/23/2017   Plantar wart of left foot 10/15/2016   Leg cramps, sleep related 04/02/2016   Hip pain 05/29/2015   Preventative health care 03/24/2015   Bladder prolapse, female, acquired 09/16/2014   Osteoporosis 06/26/2014   Arthritis    GERD (gastroesophageal reflux disease)    Hypertension    Hyperlipidemia    History of kidney stones    H/O hematuria    Diverticulosis    History of chicken pox    Anxiety and depression    SCC (squamous cell carcinoma)    Bowen's disease     Percival Spanish, PT, MPT 02/26/2021, 1:25 PM  [facility] [address]  [place], [place], [address] Phone: [phone]   Fax:  [phone]  Name: Sophia [name] MRN: [id number] Date of Birth: 1940/09/07

## 2021-02-26 NOTE — Patient Instructions (Signed)

## 2021-02-28 ENCOUNTER — Ambulatory Visit: Payer: Medicare HMO | Admitting: Cardiovascular Disease

## 2021-03-03 NOTE — Progress Notes (Signed)
Carelink Summary Report / Loop Recorder 

## 2021-03-12 ENCOUNTER — Other Ambulatory Visit: Payer: Self-pay

## 2021-03-12 ENCOUNTER — Ambulatory Visit: Payer: Medicare HMO | Attending: Family Medicine

## 2021-03-12 DIAGNOSIS — R293 Abnormal posture: Secondary | ICD-10-CM | POA: Insufficient documentation

## 2021-03-12 DIAGNOSIS — R29898 Other symptoms and signs involving the musculoskeletal system: Secondary | ICD-10-CM | POA: Diagnosis not present

## 2021-03-12 DIAGNOSIS — G4486 Cervicogenic headache: Secondary | ICD-10-CM | POA: Insufficient documentation

## 2021-03-12 DIAGNOSIS — M542 Cervicalgia: Secondary | ICD-10-CM | POA: Diagnosis not present

## 2021-03-12 DIAGNOSIS — M5412 Radiculopathy, cervical region: Secondary | ICD-10-CM | POA: Insufficient documentation

## 2021-03-12 NOTE — Therapy (Signed)
[facility] [address]  [place], [place], [address] Phone: [phone]   Fax:  [phone]  Physical Therapy Treatment  Patient Details  Name: Sophia [name] MRN: [id number] Date of Birth: July 26, 1940 Referring Provider (PT): Mosie Lukes, MD   Encounter Date: 03/12/2021   PT End of Session - 03/12/21 1359     Visit Number 9    Number of Visits 13    Date for PT Re-Evaluation 03/19/21    Authorization Type [employer]    Authorization Time Period 02/19/21 - 03/19/21    Authorization - Visit Number 4    Authorization - Number of Visits 8    Progress Note Due on Visit 13    PT Start Time 1316    PT Stop Time 1357    PT Time Calculation (min) 41 min    Activity Tolerance Patient tolerated treatment well    Behavior During Therapy [facility] for tasks assessed/performed             Past Medical History:  Diagnosis Date   Anxiety and depression    Arthritis    hands, knees, etc   Bladder prolapse, female, acquired 09/16/2014   Bowen's disease    Diverticulitis    Diverticulosis    GERD (gastroesophageal reflux disease)    H/O hematuria    for several years, work only revealed kidney stone on right side   History of chicken pox    History of kidney stones    Hyperlipidemia    Hypertension    Leg cramps, sleep related 04/02/2016   Myalgia 04/25/2017   Osteopenia 06/26/2014   Osteoporosis 06/26/2014   Plantar wart of left foot 10/15/2016   Right shoulder pain 04/23/2017   SCC (squamous cell carcinoma)    arms   Statin intolerance 09/11/2019    Past Surgical History:  Procedure Laterality Date   CHOLECYSTECTOMY  2005   LOOP RECORDER INSERTION N/A 04/15/2018   Procedure: LOOP RECORDER INSERTION;  Surgeon: Sanda Klein, MD;  Location: [facility];  Service: Cardiovascular;  Laterality: N/A;   SKIN BIOPSY     multiple    There were no vitals filed for this visit.   Subjective  Assessment - 03/12/21 1318     Subjective Pt reports only mild headache today, doing way better than before as far as pain.    Diagnostic tests 01/23/21 CT head & neck: Negative CT head   Cervical spondylosis most prominent C5-6. Severe left foraminal stenosis C5-6.    Patient Stated Goals "to have less pain and not have to have surgery on my neck"    Currently in Pain? Yes    Pain Score 2     Pain Location Head    Pain Orientation Left    Pain Descriptors / Indicators Headache    Pain Type Chronic pain                               [facility] Adult PT Treatment/Exercise - 03/12/21 0001       Neck Exercises: Machines for Strengthening   UBE (Upper Arm Bike) L2.0 x 6 min (3' fwd/3' min back)      Neck Exercises: Theraband   Shoulder External Rotation 10 reps;Red    Shoulder External Rotation Limitations standing with pool noodle behind    Horizontal ABduction 10 reps    Horizontal ABduction Limitations standing with  pool noodle      Neck Exercises: Standing   Wall Push Ups 10 reps    Wall Push Ups Limitations push ups with a plus      Neck Exercises: Stretches   Upper Trapezius Stretch Right;Left;2 reps;30 seconds    Levator Stretch Right;Left;2 reps;30 seconds      Lumbar Exercises: Stretches   Other Lumbar Stretch Exercise thoracic extension seated with foam roll 10x3"    Other Lumbar Stretch Exercise upper trunk rotations seated 10 reps each way      Manual Therapy   Manual Therapy Soft tissue mobilization;Myofascial release    Soft tissue mobilization STM/DTM to B cervical paraspinals, suboccipitals, UT, LS & scalenes    Myofascial Release manual TPR to B UT, LS & scalenes                       PT Short Term Goals - 02/26/21 1102       PT SHORT TERM GOAL #1   Title Patient will be independent with initial HEP    Status Achieved   02/12/21     PT SHORT TERM GOAL #2   Title Patient will verbalize/demonstrate good awareness of neutral  spine posture and proper body mechanics for daily tasks    Status Achieved   02/24/21              PT Long Term Goals - 02/26/21 1103       PT LONG TERM GOAL #1   Title Patient will be independent with ongoing/advanced HEP for self-management at home in order to build upon functional gains in therapy    Status Partially Met   02/26/21 - met for current HEP   Target Date 03/19/21      PT LONG TERM GOAL #2   Title Patient to demonstrate ability to achieve and maintain good spinal alignment/posturing    Status Partially Met    Target Date 03/19/21      PT LONG TERM GOAL #3   Title Patient to improve cervical AROM to [facility] without pain provocation    Status Partially Met   02/26/21 - gains demonstrated in all motions   Target Date 03/19/21      PT LONG TERM GOAL #4   Title Patient to report >/= 50-75% reduction in frequency and intensity of headaches and/or L UE radiculopathy    Status Partially Met   02/26/21 - Pt states frequency of headahes is "lots better" but unable to give a percentage of improvement. No recent L UE radiculopathy noted.   Target Date 03/19/21      PT LONG TERM GOAL #5   Title Patient to report ability to perform ADLs, household, volunteer and gardening related tasks without increased pain    Status Partially Met    Target Date 03/19/21      PT LONG TERM GOAL #6   Title Patient will report no limitation with ability to turn her head while driving due to neck pain or stiffness    Status Partially Met   02/26/21 - Pt reports improved ability to turn her head while driving   Target Date 03/19/21                   Plan - 03/12/21 1402     Clinical Impression Statement Started session with STM to the L neck and shoulder musculature. She reported improvement in movement with the L neck stretches. She reports that her pain has  improved since the beginning of PT but at times it increases up to a 7/10. She does continue to require cues for posture during  exercises. She demonstrates decreased thoracic extension which could contribute to cervical mobility deficits. Overall she responded well, would benefit from DN and postural exercises.    Personal Factors and Comorbidities Age;Comorbidity 3+;Past/Current Experience;Social Background;Time since onset of injury/illness/exacerbation    Comorbidities HTN, a-fib, NSVT, near syncope, GERD, diverticulosis, OA, osteoporosis, chronic LBP w/o sciatica, chronic hip pain, Raynaud phenomenon, sleep related leg cramps    PT Frequency 2x / week    PT Duration 6 weeks    PT Treatment/Interventions ADLs/Self Care Home Management;Electrical Stimulation;Iontophoresis 23m/ml Dexamethasone;Moist Heat;Traction;Ultrasound;Functional mobility training;Therapeutic activities;Therapeutic exercise;Neuromuscular re-education;Patient/family education;Manual techniques;Passive range of motion;Dry needling;Taping    PT Next Visit Plan manual therapy to cervical musculature including possible DN as indicated; postural stretching and strengthening; modalities PRN; posture & body mechanics education    PT Home Exercise Plan Access Code: [email or web address] (8/3, updated 8/10 & 8/22), [email or web address] (8/29)    Consulted and Agree with Plan of Care Patient             Patient will benefit from skilled therapeutic intervention in order to improve the following deficits and impairments:  Decreased activity tolerance, Decreased knowledge of precautions, Decreased mobility, Decreased range of motion, Decreased safety awareness, Decreased strength, Hypomobility, Increased fascial restricitons, Increased muscle spasms, Impaired perceived functional ability, Impaired flexibility, Impaired UE functional use, Improper body mechanics, Postural dysfunction, Pain  Visit Diagnosis: Cervicalgia  Radiculopathy, cervical region  Cervicogenic headache  Abnormal posture  Other symptoms and signs involving the musculoskeletal system     Problem  List Patient Active Problem List   Diagnosis Date Noted   Nonintractable headache 01/14/2021   Allergic drug reaction 03/26/2020   Vitamin D deficiency 12/28/2019   Complex renal cyst 12/28/2019   Elevated liver function tests 12/22/2019   Bug bite 12/22/2019   [place]istent [place] 11/13/2019   [place]coagulable [place] 11/13/2019   Encounter for loop recorder check 10/20/2019   Long term (current) use of anticoagulants 10/20/2019   Chronic bilateral low back pain without sciatica 09/24/2019   Statin intolerance 09/11/2019   Diarrhea 08/08/2018   Raynaud phenomenon 08/08/2018   Pulsatile tinnitus 04/26/2018   Hyperglycemia 04/26/2018   Cervicalgia 04/26/2018   Near syncope 04/15/2018   [place] (n[place]ust[place] tachycardia) ([place] 04/14/2018   Palpitations 02/25/2018   Bilateral carotid bruits 02/25/2018   Myalgia 04/25/2017   Left shoulder pain 04/23/2017   Plantar wart of left foot 10/15/2016   Leg cramps, sleep related 04/02/2016   Hip pain 05/29/2015   Preventative health care 03/24/2015   Bladder prolapse, female, acquired 09/16/2014   Osteoporosis 06/26/2014   Arthritis    GERD (gastroesophageal reflux disease)    Hypertension    Hyperlipidemia    History of kidney stones    H/O hematuria    Diverticulosis    History of chicken pox    Anxiety and depression    SCC (squamous cell carcinoma)    Bowen's disease     BArtist Pais PTA 03/12/2021, 2:42 PM  [facility] [place] [address]Phone: [phone]  Fax:  [phone] Name: Sophia HARWICKMRN: [id number] of Birth: [birth year]

## 2021-03-13 ENCOUNTER — Ambulatory Visit: Payer: Medicare HMO | Admitting: Physical Therapy

## 2021-03-13 DIAGNOSIS — R29898 Other symptoms and signs involving the musculoskeletal system: Secondary | ICD-10-CM

## 2021-03-13 DIAGNOSIS — M542 Cervicalgia: Secondary | ICD-10-CM

## 2021-03-13 DIAGNOSIS — R293 Abnormal posture: Secondary | ICD-10-CM | POA: Diagnosis not present

## 2021-03-13 DIAGNOSIS — M5412 Radiculopathy, cervical region: Secondary | ICD-10-CM | POA: Diagnosis not present

## 2021-03-13 DIAGNOSIS — G4486 Cervicogenic headache: Secondary | ICD-10-CM | POA: Diagnosis not present

## 2021-03-13 NOTE — Therapy (Signed)
[facility] [address]  [place], [place], [address] Phone: [phone]   Fax:  [phone]  Physical Therapy Treatment  Patient Details  Name: Sophia [name] MRN: [id number] Date of Birth: [birth year] Referring Provider (PT): Mosie Lukes, MD   Encounter Date: 03/13/2021   PT End of Session - 03/13/21 1402     Visit Number 10    Number of Visits 13    Date for PT Re-Evaluation 03/19/21    Authorization Type [employer]    Authorization Time Period 02/19/21 - 03/19/21    Authorization - Visit Number 5    Authorization - Number of Visits 8    Progress Note Due on Visit 13   PN completed on visit #8   PT Start Time 1275    PT Stop Time 1445    PT Time Calculation (min) 47 min    Activity Tolerance Patient tolerated treatment well    Behavior During Therapy [facility] for tasks assessed/performed             Past Medical History:  Diagnosis Date   Anxiety and depression    Arthritis    hands, knees, etc   Bladder prolapse, female, acquired 09/16/2014   Bowen's disease    Diverticulitis    Diverticulosis    GERD (gastroesophageal reflux disease)    H/O hematuria    for several years, work only revealed kidney stone on right side   History of chicken pox    History of kidney stones    Hyperlipidemia    Hypertension    Leg cramps, sleep related 04/02/2016   Myalgia 04/25/2017   Osteopenia 06/26/2014   Osteoporosis 06/26/2014   Plantar wart of left foot 10/15/2016   Right shoulder pain 04/23/2017   SCC (squamous cell carcinoma)    arms   Statin intolerance 09/11/2019    Past Surgical History:  Procedure Laterality Date   CHOLECYSTECTOMY  2005   LOOP RECORDER INSERTION N/A 04/15/2018   Procedure: LOOP RECORDER INSERTION;  Surgeon: Sanda Klein, MD;  Location: [facility];  Service: Cardiovascular;  Laterality: N/A;   SKIN BIOPSY     multiple    There were no vitals filed for this  visit.   Subjective Assessment - 03/13/21 1401     Subjective Pt reports slight HA yesterday and earlier today but gone now. Overall she feels she is doing much better.    Diagnostic tests 01/23/21 CT head & neck: Negative CT head   Cervical spondylosis most prominent C5-6. Severe left foraminal stenosis C5-6.    Patient Stated Goals "to have less pain and not have to have surgery on my neck"    Currently in Pain? No/denies                               [facility] Adult PT Treatment/Exercise - 03/13/21 1358       Exercises   Exercises Neck      Neck Exercises: Machines for Strengthening   UBE (Upper Arm Bike) L2.0 x 6 min (3' fwd/3' min back)      Neck Exercises: Theraband   Shoulder Extension 10 reps;Green    Shoulder Extension Limitations band anchored in door - pt demonstrating good scap retraction & depression    Rows 10 reps;Green    Rows Limitations band anchored in door - pt demonstrating good scap retraction & depression  Shoulder External Rotation 10 reps;Green    Shoulder External Rotation Limitations standing with back along doorframe    Horizontal ABduction 10 reps;Green    Horizontal ABduction Limitations standing with back along doorframe    Other Theraband Exercises Alt green TB UE diagonals 10 x 3"; standing with back against doorframe; cues for scap retraction      Neck Exercises: Seated   Neck Retraction 10 reps;5 secs    Cervical Rotation Right;Left;10 reps    Cervical Rotation Limitations rotational SNAG with pillowcase    Other Seated Exercise Cervical extension SNAG with pillowcase support 10 x 3"      Neck Exercises: Stretches   Upper Trapezius Stretch Right;Left;2 reps;30 seconds    Upper Trapezius Stretch Limitations anchoring hand on seat x1 and pulling down on towel over shoulder for shoulder & 1st rib depression x1    Levator Stretch Right;Left;2 reps;30 seconds    Levator Stretch Limitations anchoring hand on seat x1 and pulling  down on towel over shoulder for shoulder & 1st rib depression x1    Other Neck Stretches single arm rhomboid stretches holding onto doorframe & seated B with hands clasped and reaching fwd x 30 sec each    Other Neck Stretches R/L open book stretches 10 x 5"                       PT Short Term Goals - 02/26/21 1102       PT SHORT TERM GOAL #1   Title Patient will be independent with initial HEP    Status Achieved   02/12/21     PT SHORT TERM GOAL #2   Title Patient will verbalize/demonstrate good awareness of neutral spine posture and proper body mechanics for daily tasks    Status Achieved   02/24/21              PT Long Term Goals - 03/13/21 1403       PT LONG TERM GOAL #1   Title Patient will be independent with ongoing/advanced HEP for self-management at home in order to build upon functional gains in therapy    Status Partially Met   03/13/21 - HEP reviewed & updated   Target Date 03/19/21      PT LONG TERM GOAL #2   Title Patient to demonstrate ability to achieve and maintain good spinal alignment/posturing    Status Achieved   03/13/21     PT LONG TERM GOAL #3   Title Patient to improve cervical AROM to [facility] without pain provocation    Status Partially Met   02/26/21 - gains demonstrated in all motions   Target Date 03/19/21      PT LONG TERM GOAL #4   Title Patient to report >/= 50-75% reduction in frequency and intensity of headaches and/or L UE radiculopathy    Status Achieved   03/13/21 - Pt states frequency and intensity of headaches is "lots better" but unable to give a percentage of improvement. No recent L UE radiculopathy noted.     PT LONG TERM GOAL #5   Title Patient to report ability to perform ADLs, household, volunteer and gardening related tasks without increased pain    Status Achieved   03/13/21     PT LONG TERM GOAL #6   Title Patient will report no limitation with ability to turn her head while driving due to neck pain or stiffness     Status Achieved   03/13/21 -  Pt reports improved ability to turn her head while driving                  Plan - 03/13/21 1445     Clinical Impression Statement Sophia [name] reports her pain has been much better - only occasional mild headaches and no recent UE radiculopathy. She has not had to use the Biofreeze in several weeks. She is nearing the end of her POC and feels like she would be able to continue on her own with her HEP, therefore session focusing on review and update of her HEP, advancing theraband resistance to green band and clarifying/modifying exercises and stretches for maximal effectiveness. Sophia [name] able to perform good return demonstration of all exercises and tolerated progression of resistance well. Anticipate we will be able to transition to HEP + 30-day hold pending goal assessment on next visit.    Comorbidities HTN, a-fib, NSVT, near syncope, GERD, diverticulosis, OA, osteoporosis, chronic LBP w/o sciatica, chronic hip pain, Raynaud phenomenon, sleep related leg cramps    Rehab Potential Good    PT Frequency 2x / week    PT Duration 6 weeks    PT Treatment/Interventions ADLs/Self Care Home Management;Electrical Stimulation;Iontophoresis 45m/ml Dexamethasone;Moist Heat;Traction;Ultrasound;Functional mobility training;Therapeutic activities;Therapeutic exercise;Neuromuscular re-education;Patient/family education;Manual techniques;Passive range of motion;Dry needling;Taping    PT Next Visit Plan FOTO & goal assessment; anticipate transition to HEP + 30-day hold    PT Home Exercise Plan Access Code: [email or web address] (8/3, updated 8/10 & 8/22), [email or web address] (8/29)    Consulted and Agree with Plan of Care Patient             Patient will benefit from skilled therapeutic intervention in order to improve the following deficits and impairments:  Decreased activity tolerance, Decreased knowledge of precautions, Decreased mobility, Decreased range of motion, Decreased safety awareness,  Decreased strength, Hypomobility, Increased fascial restricitons, Increased muscle spasms, Impaired perceived functional ability, Impaired flexibility, Impaired UE functional use, Improper body mechanics, Postural dysfunction, Pain  Visit Diagnosis: Cervicalgia  Radiculopathy, cervical region  Cervicogenic headache  Abnormal posture  Other symptoms and signs involving the musculoskeletal system     Problem List Patient Active Problem List   Diagnosis Date Noted   Nonintractable headache 01/14/2021   Allergic drug reaction 03/26/2020   Vitamin D deficiency 12/28/2019   Complex renal cyst 12/28/2019   Elevated liver function tests 12/22/2019   Bug bite 12/22/2019   Persistent atrial fibrillation ([place] 11/13/2019   Secondary hypercoagulable state ([facility] 11/13/2019   Encounter for loop recorder check 10/20/2019   Long term (current) use of anticoagulants 10/20/2019   Chronic bilateral low back pain without sciatica 09/24/2019   Statin intolerance 09/11/2019   Diarrhea 08/08/2018   Raynaud phenomenon 08/08/2018   Pulsatile tinnitus 04/26/2018   Hyperglycemia 04/26/2018   Cervicalgia 04/26/2018   Near syncope 04/15/2018   NSVT (nonsustained ventricular tachycardia) ([facility] [place] 04/14/2018   Palpitations 02/25/2018   Bilateral carotid bruits 02/25/2018   Myalgia 04/25/2017   Left shoulder pain 04/23/2017   Plantar wart of left foot 10/15/2016   Leg cramps, sleep related 04/02/2016   Hip pain 05/29/2015   Preventative health care 03/24/2015   Bladder prolapse, female, acquired 09/16/2014   Osteoporosis 06/26/2014   Arthritis    GERD (gastroesophageal reflux disease)    Hypertension    Hyperlipidemia    History of kidney stones    H/O hematuria    Diverticulosis    History of chicken pox    Anxiety and depression    SCC (squamous cell  carcinoma)    Bowen's disease     Percival Spanish, PT 03/13/2021, 2:55 PM  [facility] [address]  [place], [place], [address] Phone: [phone]   Fax:  [phone]  Name: Sophia [name] MRN: [id number] Date of Birth: 1940/11/19

## 2021-03-14 ENCOUNTER — Encounter: Payer: Medicare HMO | Admitting: Physical Therapy

## 2021-03-17 ENCOUNTER — Ambulatory Visit (INDEPENDENT_AMBULATORY_CARE_PROVIDER_SITE_OTHER): Payer: Medicare HMO

## 2021-03-17 ENCOUNTER — Ambulatory Visit: Payer: Medicare HMO | Admitting: Physical Therapy

## 2021-03-17 DIAGNOSIS — I480 Paroxysmal atrial fibrillation: Secondary | ICD-10-CM

## 2021-03-18 ENCOUNTER — Ambulatory Visit (HOSPITAL_BASED_OUTPATIENT_CLINIC_OR_DEPARTMENT_OTHER)
Admission: RE | Admit: 2021-03-18 | Discharge: 2021-03-18 | Disposition: A | Discharge status: Home / Self Care | Payer: Medicare HMO | Source: Ambulatory Visit | Attending: Medical | Admitting: Medical

## 2021-03-18 ENCOUNTER — Ambulatory Visit (INDEPENDENT_AMBULATORY_CARE_PROVIDER_SITE_OTHER): Payer: Medicare HMO | Admitting: Medical

## 2021-03-18 ENCOUNTER — Other Ambulatory Visit: Payer: Self-pay

## 2021-03-18 VITALS — BP 155/70 | HR 80 | Temp 98.5°F | Resp 18 | Ht 61.0 in | Wt 137.0 lb

## 2021-03-18 DIAGNOSIS — M25559 Pain in unspecified hip: Secondary | ICD-10-CM | POA: Insufficient documentation

## 2021-03-18 DIAGNOSIS — M25551 Pain in right hip: Secondary | ICD-10-CM | POA: Diagnosis not present

## 2021-03-18 LAB — CUP PACEART REMOTE DEVICE CHECK
Date Time Interrogation Session: 20220916011212
Implantable Pulse Generator Implant Date: 20191018

## 2021-03-18 MED ORDER — PREDNISONE 10 MG (21) PO TBPK: ORAL_TABLET | ORAL | 0 refills | Status: DC

## 2021-03-18 NOTE — Patient Instructions (Signed)
For rt hip pain severe with activity will get rt hip xray. Considered option for pain. Can't take nsaids and presently want to avoid naroctics unless indicated. Rx 6 day taper prednisone. After xay if still in severe pain consider orthopedist or sports med referral. If xray negative consider soft tissue/bursae as source of pain.  Bp elevated but expect from severe pain.   Follow up date to be determined after xray review and after assess response to treatment.

## 2021-03-18 NOTE — Progress Notes (Signed)
Subjective:    Patient ID: Sophia [name], female    DOB: [birth year], 80 y.o.   MRN: [id number]  HPI  Pt in for some recent hip pain.  Pt states she has history of some chronic intetmittent rt hip pain in past. Pt states all day yesterday was helping brother move. She was walking a lot yesterday, scrubbing, bending over and light lifting. Then last night bent down last night to get something under her bed and stool up felt severe rt hip pain. Had to borrow walker from daughter.   Pain level 8/10. Some pain event sitting but a lot lower.   09-24-2020 FINDINGS: Pelvic ring is intact. No acute fracture or dislocation is noted. Stable bone island in the right ischial tuberosity is seen. No soft tissue abnormality is noted. A pessary is noted in place.   IMPRESSION: No acute abnormality noted.  Labs show normal kidney function.  No ulcers in stomach. Hx of refux but controlled.  No diabetes.    Review of Systems  Constitutional:  Negative for chills, fatigue and fever.  Respiratory:  Negative for cough, chest tightness, shortness of breath and wheezing.   Cardiovascular:  Negative for chest pain and palpitations.  Gastrointestinal:  Negative for abdominal distention, anal bleeding, constipation, nausea and vomiting.  Genitourinary:  Negative for dysuria, flank pain and frequency.  Musculoskeletal:  Negative for back pain and myalgias.  Skin:  Negative for pallor and rash.    Past Medical History:  Diagnosis Date   Anxiety and depression    Arthritis    hands, knees, etc   Bladder prolapse, female, acquired 09/16/2014   Bowen's disease    Diverticulitis    Diverticulosis    GERD (gastroesophageal reflux disease)    H/O hematuria    for several years, work only revealed kidney stone on right side   History of chicken pox    History of kidney stones    Hyperlipidemia    Hypertension    Leg cramps, sleep related 04/02/2016   Myalgia 04/25/2017   Osteopenia 06/26/2014    Osteoporosis 06/26/2014   Plantar wart of [name] foot 10/15/2016   Right shoulder pain 04/23/2017   SCC (squamous cell carcinoma)    arms   Statin intolerance 09/11/2019     Social History   Socioeconomic History   Marital status: Single    Spouse name: Not on file   Number of children: Not on file   Years of education: Not on file   Highest education level: Not on file  Occupational History   Not on file  Tobacco Use   Smoking status: Never   Smokeless tobacco: Never  Vaping Use   Vaping Use: Never used  Substance and Sexual Activity   Alcohol use: No    Alcohol/week: 0.0 standard drinks   Drug use: No   Sexual activity: Never    Comment: divorced, widowed, lives with elderly parents, is the caregiver, no dietary restrictions.   Other Topics Concern   Not on file  Social History Narrative   Not on file   Social Determinants of Health   Financial Resource Strain: Not on file  Food Insecurity: Not on file  Transportation Needs: Not on file  Physical Activity: Not on file  Stress: Not on file  Social Connections: Not on file  Intimate Partner Violence: Not on file    Past Surgical History:  Procedure Laterality Date   CHOLECYSTECTOMY  2005   [place] N/A 04/15/2018  Procedure: LOOP RECORDER INSERTION;  Surgeon: Sanda Klein, MD;  Location: [facility];  Service: Cardiovascular;  Laterality: N/A;   SKIN BIOPSY     multiple    Family History  Problem Relation Age of Onset   Heart disease Mother        aortic stenosis   Hyperlipidemia Mother    Hypertension Mother    Heart attack Mother    Cancer Mother        BCC, SCC, skin   Hypertension Father    Cancer Father        SCC, BCC, skin   Stroke Brother    Early death Daughter    Heart disease Maternal Grandfather        MI   Tuberculosis Paternal Grandmother    Cancer Paternal Grandfather        head and neck cancer, chewed tobacco   Hypertension Daughter    Heart disease  Brother        arrythmia   Hypertension Brother    Hyperlipidemia Brother    Birth defects Maternal Uncle     Allergies  Allergen Reactions   Morphine And Related Swelling   Statins Other (See Comments)    Myalgias, weakness (atorvastatin, RYR, rosuvastatin, pravastatin)   Sulfa Antibiotics Swelling and Rash    Current Outpatient Medications on File Prior to Visit  Medication Sig Dispense Refill   acetaminophen (TYLENOL) 500 MG tablet Take 1,000 mg by mouth 2 (two) times daily as needed for moderate pain or headache.     apixaban (ELIQUIS) 5 MG TABS tablet TAKE 1 TABLET(5 MG) BY MOUTH TWICE DAILY 180 tablet 1   baclofen (LIORESAL) 10 MG tablet Take 0.5-1 tablets (5-10 mg total) by mouth at bedtime as needed for muscle spasms. 30 each 1   Calcium Carb-Cholecalciferol (CALCIUM 600 + D PO) Take 1 tablet by mouth daily.     carvedilol (COREG) 25 MG tablet TAKE 1 TABLET(25 MG) BY MOUTH TWICE DAILY WITH A MEAL 180 tablet 1   Cholecalciferol (VITAMIN D) 50 MCG (2000 UT) tablet Take 1 tablet (2,000 Units total) by mouth daily.     Co-Enzyme Q10 200 MG CAPS Take 200 mg by mouth daily.     diltiazem (CARDIZEM CD) 240 MG 24 hr capsule TAKE 1 CAPSULE(240 MG) BY MOUTH DAILY 90 capsule 2   estradiol (ESTRACE) 0.1 MG/GM vaginal cream Place 1 Applicatorful vaginally 2 (two) times a week.     famotidine (PEPCID) 20 MG tablet Take 1 tablet (20 mg total) by mouth 2 (two) times daily as needed for heartburn or indigestion. Pt takes before bed daily 180 tablet 0   Hypromellose (ARTIFICIAL TEARS OP) Place 1 drop into both eyes 2 (two) times daily.     Probiotic Product (PROBIOTIC DAILY PO) Take 1 capsule by mouth daily.      REPATHA SURECLICK 081 MG/ML SOAJ INJECT 1 DOSE INTO THE SKIN EVERY 14 DAYS 2 mL 11   No current facility-administered medications on file prior to visit.    BP (!) 155/70   Pulse 80   Temp 98.5 F (36.9 C)   Resp 18   Ht 5\' 1"  (1.549 m)   Wt 137 lb (62.1 kg)   SpO2 98%   BMI  25.89 kg/m       Objective:   Physical Exam  General- No acute distress. Pleasant patient. Neck- Full range of motion, no jvd Lungs- Clear, even and unlabored. Heart- regular rate and rhythm. Neurologic-  CNII- XII grossly intact.   Rt hip- easily induced pain on light palpation. Pain on log roll of thigh. When stands and rotates rt lower ext pain increased.    Assessment & Plan:   Patient Instructions  For rt hip pain severe with activity will get rt hip xray. Considered option for pain. Can't take nsaids and presently want to avoid naroctics unless indicated. Rx 6 day taper prednisone. After xay if still in severe pain consider orthopedist or sports med referral. If xray negative consider soft tissue/bursae as source of pain.  Bp elevated but expect from severe pain.   Follow up date to be determined after xray review and after assess response to treatment.   Mackie Pai, PA-C

## 2021-03-19 ENCOUNTER — Telehealth: Payer: Self-pay | Admitting: Family Medicine

## 2021-03-19 ENCOUNTER — Other Ambulatory Visit: Payer: Self-pay | Admitting: Family Medicine

## 2021-03-19 DIAGNOSIS — R300 Dysuria: Secondary | ICD-10-CM

## 2021-03-19 MED ORDER — AMOXICILLIN 500 MG PO CAPS: 500.0000 mg | ORAL_CAPSULE | Freq: Three times a day (TID) | ORAL | 0 refills | Status: AC

## 2021-03-19 NOTE — Addendum Note (Signed)
Addended by: Anabel Halon on: 03/19/2021 09:26 PM   Modules accepted: Orders

## 2021-03-19 NOTE — Telephone Encounter (Signed)
Patient daughter Angus [place]ms called and stated that her mom is having uncontrollable bladder issues. Please advise. [phone]

## 2021-03-20 NOTE — Telephone Encounter (Signed)
Patient stated that she is doing better now and does not need to take medication.  She will cancel rx at pharmacy.

## 2021-03-21 NOTE — Progress Notes (Signed)
Carelink Summary Report / Loop Recorder 

## 2021-03-26 ENCOUNTER — Ambulatory Visit: Payer: Medicare HMO

## 2021-03-26 ENCOUNTER — Other Ambulatory Visit: Payer: Self-pay

## 2021-03-26 ENCOUNTER — Other Ambulatory Visit: Payer: Self-pay | Admitting: Family Medicine

## 2021-03-26 ENCOUNTER — Telehealth: Payer: Self-pay | Admitting: Family Medicine

## 2021-03-26 DIAGNOSIS — M5412 Radiculopathy, cervical region: Secondary | ICD-10-CM

## 2021-03-26 DIAGNOSIS — R293 Abnormal posture: Secondary | ICD-10-CM

## 2021-03-26 DIAGNOSIS — G4486 Cervicogenic headache: Secondary | ICD-10-CM

## 2021-03-26 DIAGNOSIS — M25551 Pain in right hip: Secondary | ICD-10-CM

## 2021-03-26 DIAGNOSIS — M542 Cervicalgia: Secondary | ICD-10-CM | POA: Diagnosis not present

## 2021-03-26 DIAGNOSIS — R29898 Other symptoms and signs involving the musculoskeletal system: Secondary | ICD-10-CM

## 2021-03-26 NOTE — Therapy (Addendum)
[facility] [address]  [place], [place], [address] Phone: [phone]   Fax:  [phone]  Physical Therapy Treatment / Discharge Summary  Patient Details  Name: Sophia [name] MRN: [id number] Date of Birth: Oct 14, 1940 Referring Provider (PT): Mosie Lukes, MD   Encounter Date: 03/26/2021   PT End of Session - 03/26/21 1437     Visit Number 11    Number of Visits 13    Date for PT Re-Evaluation 03/19/21    Authorization Type [employer]    Authorization Time Period 02/19/21 - 03/19/21    Authorization - Visit Number 6    Authorization - Number of Visits 8    Progress Note Due on Visit 13    PT Start Time 6962    PT Stop Time 1430    PT Time Calculation (min) 28 min    Activity Tolerance Patient tolerated treatment well    Behavior During Therapy [facility] for tasks assessed/performed             Past Medical History:  Diagnosis Date   Anxiety and depression    Arthritis    hands, knees, etc   Bladder prolapse, female, acquired 09/16/2014   Bowen's disease    Diverticulitis    Diverticulosis    GERD (gastroesophageal reflux disease)    H/O hematuria    for several years, work only revealed kidney stone on right side   History of chicken pox    History of kidney stones    Hyperlipidemia    Hypertension    Leg cramps, sleep related 04/02/2016   Myalgia 04/25/2017   Osteopenia 06/26/2014   Osteoporosis 06/26/2014   Plantar wart of left foot 10/15/2016   Right shoulder pain 04/23/2017   SCC (squamous cell carcinoma)    arms   Statin intolerance 09/11/2019    Past Surgical History:  Procedure Laterality Date   CHOLECYSTECTOMY  2005   LOOP RECORDER INSERTION N/A 04/15/2018   Procedure: LOOP RECORDER INSERTION;  Surgeon: Sanda Klein, MD;  Location: [facility];  Service: Cardiovascular;  Laterality: N/A;   SKIN BIOPSY     multiple    There were no vitals filed for this visit.    Subjective Assessment - 03/26/21 1405     Subjective Pt reports that she helped some family members move into a new house, flared up her R hip but doing better today.    Diagnostic tests 01/23/21 CT head & neck: Negative CT head   Cervical spondylosis most prominent C5-6. Severe left foraminal stenosis C5-6.    Patient Stated Goals "to have less pain and not have to have surgery on my neck"    Currently in Pain? No/denies                [facility] PT Assessment - 03/26/21 0001       Observation/Other Assessments   Focus on Therapeutic Outcomes (FOTO)  Neck = 61; predicted D/C FS=57      AROM   Cervical Flexion 44    Cervical Extension 46    Cervical - Right Side Bend 33    Cervical - Left Side Bend 28    Cervical - Right Rotation 67    Cervical - Left Rotation 55                           [facility] Adult PT Treatment/Exercise - 03/26/21 0001  Neck Exercises: Machines for Strengthening   Nustep L4 x 6 min (UE/LE)                     PT Education - 03/26/21 1436     Education Details Edu on goal status, progress, 30 day hold details.    Person(s) Educated Patient    Methods Explanation;Tactile cues    Comprehension Verbalized understanding;Returned demonstration              PT Short Term Goals - 02/26/21 1102       PT SHORT TERM GOAL #1   Title Patient will be independent with initial HEP    Status Achieved   02/12/21     PT SHORT TERM GOAL #2   Title Patient will verbalize/demonstrate good awareness of neutral spine posture and proper body mechanics for daily tasks    Status Achieved   02/24/21              PT Long Term Goals - 03/26/21 1434       PT LONG TERM GOAL #1   Title Patient will be independent with ongoing/advanced HEP for self-management at home in order to build upon functional gains in therapy    Status Achieved   03/26/21      PT LONG TERM GOAL #2   Title Patient to demonstrate ability to achieve and  maintain good spinal alignment/posturing    Status Achieved   03/13/21     PT LONG TERM GOAL #3   Title Patient to improve cervical AROM to [facility] without pain provocation    Status Partially Met   AROM WFL no pain provocation but still mildly tight with L SB and rotation     PT LONG TERM GOAL #4   Title Patient to report >/= 50-75% reduction in frequency and intensity of headaches and/or L UE radiculopathy    Status Achieved   03/13/21 - Pt states frequency and intensity of headaches is "lots better" but unable to give a percentage of improvement. No recent L UE radiculopathy noted.     PT LONG TERM GOAL #5   Title Patient to report ability to perform ADLs, household, volunteer and gardening related tasks without increased pain    Status Achieved   03/13/21     PT LONG TERM GOAL #6   Title Patient will report no limitation with ability to turn her head while driving due to neck pain or stiffness    Status Achieved   03/13/21 - Pt reports improved ability to turn her head while driving                  Plan - 03/26/21 1438     Clinical Impression Statement Mrs. Robar has made much progress with PT. She currently has met all of her goals except LTG 3. She still demonstrates less ROM on the L side but w/o pain provocation. She noted that she has increased ability with rotating her head to check blind spots. She also demonstrates very good postural awareness. Spent session providing instructions with HEP and talking with her about the details of 30 day hold. Due to her functional improvement she will be placed on a 30 day hold.    Personal Factors and Comorbidities Age;Comorbidity 3+;Past/Current Experience;Social Background;Time since onset of injury/illness/exacerbation    Comorbidities HTN, a-fib, NSVT, near syncope, GERD, diverticulosis, OA, osteoporosis, chronic LBP w/o sciatica, chronic hip pain, Raynaud phenomenon, sleep related leg cramps  PT Frequency 2x / week    PT Duration 6  weeks    PT Treatment/Interventions ADLs/Self Care Home Management;Electrical Stimulation;Iontophoresis 56m/ml Dexamethasone;Moist Heat;Traction;Ultrasound;Functional mobility training;Therapeutic activities;Therapeutic exercise;Neuromuscular re-education;Patient/family education;Manual techniques;Passive range of motion;Dry needling;Taping    PT Next Visit Plan transition to HEP + 30-day hold    PT Home Exercise Plan Access Code: [email or web address] (8/3, updated 8/10 & 8/22), [email or web address] (8/29)    Consulted and Agree with Plan of Care Patient             Patient will benefit from skilled therapeutic intervention in order to improve the following deficits and impairments:  Decreased activity tolerance, Decreased knowledge of precautions, Decreased mobility, Decreased range of motion, Decreased safety awareness, Decreased strength, Hypomobility, Increased fascial restricitons, Increased muscle spasms, Impaired perceived functional ability, Impaired flexibility, Impaired UE functional use, Improper body mechanics, Postural dysfunction, Pain  Visit Diagnosis: Cervicalgia  Radiculopathy, cervical region  Cervicogenic headache  Abnormal posture  Other symptoms and signs involving the musculoskeletal system     Problem List Patient Active Problem List   Diagnosis Date Noted   Nonintractable headache 01/14/2021   Allergic drug reaction 03/26/2020   Vitamin D deficiency 12/28/2019   Complex renal cyst 12/28/2019   Elevated liver function tests 12/22/2019   Bug bite 12/22/2019   Persistent atrial fibrillation ([facility] 11/13/2019   Secondary hypercoagulable state ([facility] 11/13/2019   Encounter for loop recorder check 10/20/2019   Long term (current) use of anticoagulants 10/20/2019   Chronic bilateral low back pain without sciatica 09/24/2019   Statin intolerance 09/11/2019   Diarrhea 08/08/2018   Raynaud phenomenon 08/08/2018   Pulsatile tinnitus 04/26/2018   Hyperglycemia 04/26/2018    Cervicalgia 04/26/2018   Near syncope 04/15/2018   NSVT (nonsustained ventricular tachycardia) ([facility] 04/14/2018   Palpitations 02/25/2018   Bilateral carotid bruits 02/25/2018   Myalgia 04/25/2017   Left shoulder pain 04/23/2017   Plantar wart of left foot 10/15/2016   Leg cramps, sleep related 04/02/2016   Hip pain 05/29/2015   Preventative health care 03/24/2015   Bladder prolapse, female, acquired 09/16/2014   Osteoporosis 06/26/2014   Arthritis    GERD (gastroesophageal reflux disease)    Hypertension    Hyperlipidemia    History of kidney stones    H/O hematuria    Diverticulosis    History of chicken pox    Anxiety and depression    SCC (squamous cell carcinoma)    Bowen's disease     BArtist Pais PTA 03/26/2021, 2:56 PM  [facility] [place] [address]Phone: [phone]  Fax:  [phone] Name: Sophia MATUSKAMRN: [id number] of Birth: 130-Oct-1942  PHYSICAL THERAPY DISCHARGE SUMMARY  Visits from Start of Care: 11  Current functional level related to goals / functional outcomes:   Refer to above clinical impression for status as of last visit on 03/26/21. Patient was placed on hold for 30 days and has not needed to return to PT for cervical related issues, although new referral received for R hip pain with eval completed 04/25/21 at which time she stated her neck has been great and she has not has any headaches since completing PT. Will proceed with discharge from PT for this episode related to cervicalgia and headaches.   Remaining deficits:   As above.    Education / Equipment:   HEP; pBiomedical scientisteducation   Patient agrees to discharge. Patient  goals were mostly met. Patient is being discharged due to being pleased with the current functional level.   Percival Spanish, PT, MPT 04/25/21, 12:24 PM  [facility] [address]  [place], [place], [address] Phone: [phone]   Fax:  [phone]

## 2021-03-26 NOTE — Telephone Encounter (Signed)
Pt came in office stating was referral to an Orthopedic specialist for her right side hip by her pcp but pt thought that she will get better and really did not need an orthopedic to see her, pcp decided that since pt didn't not want to see orthopedics at the moment pt was referral to Sport medicine, pt states that her daughter advise pt that it was best for pt to see an orthopedic specialist.  Pt would like to know if she can get another referral put in for orthopedics (pt states last referral has been quiet awhile) since daughter are insisting to be seen by orthopedic and not sport medicine. If possible for provider to put in a referral for orthopedic Dr Delilah Shan at [facility] near [address] in [place]. Pt would like to be called at [phone] to let her know about the referral. Please advise.

## 2021-03-27 NOTE — Telephone Encounter (Signed)
done

## 2021-03-28 ENCOUNTER — Ambulatory Visit: Payer: Medicare HMO

## 2021-03-31 ENCOUNTER — Ambulatory Visit: Payer: Medicare HMO | Admitting: Physical Therapy

## 2021-04-02 DIAGNOSIS — M25551 Pain in right hip: Secondary | ICD-10-CM | POA: Diagnosis not present

## 2021-04-07 ENCOUNTER — Ambulatory Visit: Payer: Medicare HMO | Admitting: Family Medicine

## 2021-04-09 ENCOUNTER — Other Ambulatory Visit: Payer: Self-pay

## 2021-04-09 ENCOUNTER — Ambulatory Visit: Payer: Medicare HMO | Admitting: Cardiovascular Disease

## 2021-04-09 ENCOUNTER — Encounter: Payer: Self-pay | Admitting: Cardiovascular Disease

## 2021-04-09 DIAGNOSIS — E782 Mixed hyperlipidemia: Secondary | ICD-10-CM | POA: Diagnosis not present

## 2021-04-09 DIAGNOSIS — R002 Palpitations: Secondary | ICD-10-CM

## 2021-04-09 DIAGNOSIS — R931 Abnormal findings on diagnostic imaging of heart and coronary circulation: Secondary | ICD-10-CM | POA: Diagnosis not present

## 2021-04-09 DIAGNOSIS — I10 Essential (primary) hypertension: Secondary | ICD-10-CM | POA: Diagnosis not present

## 2021-04-09 DIAGNOSIS — R0989 Other specified symptoms and signs involving the circulatory and respiratory systems: Secondary | ICD-10-CM

## 2021-04-09 NOTE — Progress Notes (Signed)
04/09/2021 Sophia LOUISSAINT   February 03, 1941  [id number]  Primary Physician Mosie Lukes, MD Primary Cardiologist: Lorretta Harp MD FACP, [place], [place], [place]  HPI:  Sophia [name] is a 80 y.o.  mild to moderately overweight divorced Caucasian female mother of 3 children, grandmother of 5 grandchildren who I last saw in the office 08/30/2020.  She is accompanied by her daughter Angus Palms today.  She was referred by the ER for evaluation of chest pain.  Her primary care provider is Dr. [name] .   Risk factors include hyperlipidemia intolerant to statin therapy and family history with mother who had a myocardial infarction at age 51 as well as extensive vascular disease.  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  She is never had a heart attack or stroke.  She has had palpitations evaluated by cardiologist in [place] 7 years ago which time a work-up included stress testing and event monitoring.  She was placed on carvedilol at that time.  She was seen in the emergency room at [facility] on 02/22/2018 with chest pain and palpitations.  Pain began in the early morning hours and got worse.  She thought it was indigestion.  She did feel increasing palpitations as well as shortness of breath.  She says she can also hear her "heartbeat" in her left ear.  Her troponins were negative and her EKG showed no acute changes at that time.   When I saw her in the office on 11/08/2019 she was in A. fib with a ventricular response of 106.  I did add Cardizem CD 120 mg a day.  She was already on Eliquis.  I referred her to the A. fib clinic who saw her on 11/13/2019.  She had converted spontaneously.     Since I saw her 6 months ago she continues to do well.  She does walk 1/2 to 1 mile a day is completely asymptomatic.  She has no palpitations.  She remains on Eliquis oral anticoagulation for her PAF maintaining sinus rhythm.  Her loop recorder placed by Dr.  Sallyanne Kuster has expired but she has elected to keep it and replace rather than explanted.  She is tolerating her Repatha.   Current Meds  Medication Sig   acetaminophen (TYLENOL) 500 MG tablet Take 1,000 mg by mouth 2 (two) times daily as needed for moderate pain or headache.   apixaban (ELIQUIS) 5 MG TABS tablet TAKE 1 TABLET(5 MG) BY MOUTH TWICE DAILY   baclofen (LIORESAL) 10 MG tablet Take 0.5-1 tablets (5-10 mg total) by mouth at bedtime as needed for muscle spasms.   Calcium Carb-Cholecalciferol (CALCIUM 600 + D PO) Take 1 tablet by mouth daily.   carvedilol (COREG) 25 MG tablet TAKE 1 TABLET(25 MG) BY MOUTH TWICE DAILY WITH A MEAL   Cholecalciferol (VITAMIN D) 50 MCG (2000 UT) tablet Take 1 tablet (2,000 Units total) by mouth daily.   Co-Enzyme Q10 200 MG CAPS Take 200 mg by mouth daily.   diltiazem (CARDIZEM CD) 240 MG 24 hr capsule TAKE 1 CAPSULE(240 MG) BY MOUTH DAILY   estradiol (ESTRACE) 0.1 MG/GM vaginal cream Place 1 Applicatorful vaginally 2 (two) times a week.   famotidine (PEPCID) 20 MG tablet Take 1 tablet (20 mg total) by mouth 2 (two) times daily as needed for heartburn or indigestion. Pt takes before bed daily   Hypromellose (ARTIFICIAL TEARS OP) Place 1 drop into both eyes 2 (  two) times daily.   Probiotic Product (PROBIOTIC DAILY PO) Take 1 capsule by mouth daily.    REPATHA SURECLICK 696 MG/ML SOAJ INJECT 1 DOSE INTO THE SKIN EVERY 14 DAYS     Allergies  Allergen Reactions   Morphine And Related Swelling   Statins Other (See Comments)    Myalgias, weakness (atorvastatin, RYR, rosuvastatin, pravastatin)   Sulfa Antibiotics Swelling and Rash    Social History   Socioeconomic History   Marital status: Single    Spouse name: Not on file   Number of children: Not on file   Years of education: Not on file   Highest education level: Not on file  Occupational History   Not on file  Tobacco Use   Smoking status: Never   Smokeless tobacco: Never  Vaping Use   Vaping  Use: Never used  Substance and Sexual Activity   Alcohol use: No    Alcohol/week: 0.0 standard drinks   Drug use: No   Sexual activity: Never    Comment: divorced, widowed, lives with elderly parents, is the caregiver, no dietary restrictions.   Other Topics Concern   Not on file  Social History Narrative   Not on file   Social Determinants of Health   Financial Resource Strain: Not on file  Food Insecurity: Not on file  Transportation Needs: Not on file  Physical Activity: Not on file  Stress: Not on file  Social Connections: Not on file  Intimate Partner Violence: Not on file     Review of Systems: General: negative for chills, fever, night sweats or weight changes.  Cardiovascular: negative for chest pain, dyspnea on exertion, edema, orthopnea, palpitations, paroxysmal nocturnal dyspnea or shortness of breath Dermatological: negative for rash Respiratory: negative for cough or wheezing Urologic: negative for hematuria Abdominal: negative for nausea, vomiting, diarrhea, bright red blood per rectum, melena, or hematemesis Neurologic: negative for visual changes, syncope, or dizziness All other systems reviewed and are otherwise negative except as noted above.    Blood pressure 140/78, pulse (!) 57, height 5\' 1"  (1.549 m), weight 137 lb 3.2 oz (62.2 kg), SpO2 99 %.  General appearance: alert and no distress Neck: no adenopathy, no JVD, supple, symmetrical, trachea midline, thyroid not enlarged, symmetric, no tenderness/mass/nodules, and right carotid bruit Lungs: clear to auscultation bilaterally Heart: regular rate and rhythm, S1, S2 normal, no murmur, click, rub or gallop Extremities: extremities normal, atraumatic, no cyanosis or edema Pulses: 2+ and symmetric Skin: Skin color, texture, turgor normal. No rashes or lesions Neurologic: Grossly normal  EKG sinus bradycardia 57 without ST or T wave changes.  Personally reviewed this EKG.  ASSESSMENT AND PLAN:    Hypertension History of essential hypertension a blood pressure measured today at 140/78.  She is on carvedilol, diltiazem.  Hyperlipidemia History of hyperlipidemia on Repatha with recent lipid profile performed 01/14/2021 revealing a total cholesterol 144, LDL 56 and HDL 49.  Palpitations History of palpitations found to be PAF.  She does have a loop recorder in which has expired and she is elected to have it remain.  She is maintained sinus rhythm on Eliquis oral anticoagulation.  Bilateral carotid bruits Right carotid bruit on exam today.  Negative carotid Dopplers in 2019.  We will repeat carotid Doppler studies.  Elevated coronary artery calcium score Coronary calcium score performed 01/24/2020 was 304.  Her lipid profile is at goal for secondary prevention.  She did have a negative Myoview stress test performed 03/10/2018.     Roderic Palau  Adora Fridge MD FACP,FACC,FAHA, [facility] 04/09/2021 10:18 AM

## 2021-04-09 NOTE — Assessment & Plan Note (Signed)
Right carotid bruit on exam today.  Negative carotid Dopplers in 2019.  We will repeat carotid Doppler studies.

## 2021-04-09 NOTE — Patient Instructions (Signed)
Medication Instructions:  Your physician recommends that you continue on your current medications as directed. Please refer to the Current Medication list given to you today.  *If you need a refill on your cardiac medications before your next appointment, please call your pharmacy*   Testing/Procedures: Your physician has requested that you have a carotid duplex. This test is an ultrasound of the carotid arteries in your neck. It looks at blood flow through these arteries that supply the brain with blood. Allow one hour for this exam. There are no restrictions or special instructions. This procedure is done at [address]    Follow-Up: At [facility], you and your health needs are our priority.  As part of our continuing mission to provide you with exceptional heart care, we have created designated Provider Care Teams.  These Care Teams include your primary Cardiologist (physician) and Advanced Practice Providers (APPs -  Physician Assistants and Nurse Practitioners) who all work together to provide you with the care you need, when you need it.  We recommend signing up for the patient portal called "MyChart".  Sign up information is provided on this After Visit Summary.  MyChart is used to connect with patients for Virtual Visits (Telemedicine).  Patients are able to view lab/test results, encounter notes, upcoming appointments, etc.  Non-urgent messages can be sent to your provider as well.   To learn more about what you can do with MyChart, go to [email or web address].    Your next appointment:   12 month(s)  The format for your next appointment:   In Person  Provider:   Jonathan Berry, MD 

## 2021-04-09 NOTE — Assessment & Plan Note (Signed)
History of hyperlipidemia on Repatha with recent lipid profile performed 01/14/2021 revealing a total cholesterol 144, LDL 56 and HDL 49.

## 2021-04-09 NOTE — Assessment & Plan Note (Signed)
History of palpitations found to be PAF.  She does have a loop recorder in which has expired and she is elected to have it remain.  She is maintained sinus rhythm on Eliquis oral anticoagulation.

## 2021-04-09 NOTE — Assessment & Plan Note (Signed)
History of essential hypertension a blood pressure measured today at 140/78.  She is on carvedilol, diltiazem.

## 2021-04-09 NOTE — Assessment & Plan Note (Signed)
Coronary calcium score performed 01/24/2020 was 304.  Her lipid profile is at goal for secondary prevention.  She did have a negative Myoview stress test performed 03/10/2018.

## 2021-04-17 ENCOUNTER — Other Ambulatory Visit: Payer: Self-pay

## 2021-04-17 ENCOUNTER — Telehealth (INDEPENDENT_AMBULATORY_CARE_PROVIDER_SITE_OTHER): Payer: Medicare HMO | Admitting: Family Medicine

## 2021-04-17 DIAGNOSIS — R739 Hyperglycemia, unspecified: Secondary | ICD-10-CM | POA: Diagnosis not present

## 2021-04-17 DIAGNOSIS — I10 Essential (primary) hypertension: Secondary | ICD-10-CM

## 2021-04-17 DIAGNOSIS — E559 Vitamin D deficiency, unspecified: Secondary | ICD-10-CM | POA: Diagnosis not present

## 2021-04-17 DIAGNOSIS — R519 Headache, unspecified: Secondary | ICD-10-CM | POA: Diagnosis not present

## 2021-04-17 NOTE — Progress Notes (Signed)
MyChart Video Visit    Virtual Visit via Video Note   This visit type was conducted due to national recommendations for restrictions regarding the COVID-19 Pandemic (e.g. social distancing) in an effort to limit this patient's exposure and mitigate transmission in our community. This patient is at least at moderate risk for complications without adequate follow up. This format is felt to be most appropriate for this patient at this time. Physical exam was limited by quality of the video and audio technology used for the visit. S. Chism, CMA was able to get the patient set up on a video visit.  Patient location: home Patient and provider in visit Provider location: Office  I discussed the limitations of evaluation and management by telemedicine and the availability of in person appointments. The patient expressed understanding and agreed to proceed.  Visit Date: 04/18/21  Today's healthcare provider: Penni Homans, MD     Subjective:    Patient ID: Sophia [name], female    DOB: 03/07/1941, 80 y.o.   MRN: [id number]  No chief complaint on file.   HPI Patient is in today for a virtual visit for follow up on HA's and HTN. Since starting PT 3 weeks ago her HA's have subsided and her neck pain has improved. She is doing well and has no febrile illnesses or recent ER visits to report. Denies CP/palp/SOB/HA/congestion/fevers/GI or GU c/o. Taking meds as prescribed     Past Medical History:  Diagnosis Date   Anxiety and depression    Arthritis    hands, knees, etc   Bladder prolapse, female, acquired 09/16/2014   Bowen's disease    Diverticulitis    Diverticulosis    GERD (gastroesophageal reflux disease)    H/O hematuria    for several years, work only revealed kidney stone on right side   History of chicken pox    History of kidney stones    Hyperlipidemia    Hypertension    Leg cramps, sleep related 04/02/2016   Myalgia 04/25/2017   Osteopenia 06/26/2014    Osteoporosis 06/26/2014   Plantar wart of [name] foot 10/15/2016   Right shoulder pain 04/23/2017   SCC (squamous cell carcinoma)    arms   Statin intolerance 09/11/2019    Past Surgical History:  Procedure Laterality Date   CHOLECYSTECTOMY  2005   LOOP RECORDER INSERTION N/A 04/15/2018   Procedure: LOOP RECORDER INSERTION;  Surgeon: Sanda Klein, MD;  Location: [facility];  Service: Cardiovascular;  Laterality: N/A;   SKIN BIOPSY     multiple    Family History  Problem Relation Age of Onset   Heart disease Mother        aortic stenosis   Hyperlipidemia Mother    Hypertension Mother    Heart attack Mother    Cancer Mother        BCC, SCC, skin   Hypertension Father    Cancer Father        SCC, BCC, skin   Stroke Brother    Early death Daughter    Heart disease Maternal Grandfather        MI   Tuberculosis Paternal Grandmother    Cancer Paternal Grandfather        head and neck cancer, chewed tobacco   Hypertension Daughter    Heart disease Brother        arrythmia   Hypertension Brother    Hyperlipidemia Brother    Birth defects Maternal Uncle  Social History   Socioeconomic History   Marital status: Single    Spouse name: Not on file   Number of children: Not on file   Years of education: Not on file   Highest education level: Not on file  Occupational History   Not on file  Tobacco Use   Smoking status: Never   Smokeless tobacco: Never  Vaping Use   Vaping Use: Never used  Substance and Sexual Activity   Alcohol use: No    Alcohol/week: 0.0 standard drinks   Drug use: No   Sexual activity: Never    Comment: divorced, widowed, lives with elderly parents, is the caregiver, no dietary restrictions.   Other Topics Concern   Not on file  Social History Narrative   Not on file   Social Determinants of Health   Financial Resource Strain: Not on file  Food Insecurity: Not on file  Transportation Needs: Not on file  Physical Activity: Not  on file  Stress: Not on file  Social Connections: Not on file  Intimate Partner Violence: Not on file    Outpatient Medications Prior to Visit  Medication Sig Dispense Refill   acetaminophen (TYLENOL) 500 MG tablet Take 1,000 mg by mouth 2 (two) times daily as needed for moderate pain or headache.     apixaban (ELIQUIS) 5 MG TABS tablet TAKE 1 TABLET(5 MG) BY MOUTH TWICE DAILY 180 tablet 1   baclofen (LIORESAL) 10 MG tablet Take 0.5-1 tablets (5-10 mg total) by mouth at bedtime as needed for muscle spasms. 30 each 1   Calcium Carb-Cholecalciferol (CALCIUM 600 + D PO) Take 1 tablet by mouth daily.     carvedilol (COREG) 25 MG tablet TAKE 1 TABLET(25 MG) BY MOUTH TWICE DAILY WITH A MEAL 180 tablet 1   Cholecalciferol (VITAMIN D) 50 MCG (2000 UT) tablet Take 1 tablet (2,000 Units total) by mouth daily.     Co-Enzyme Q10 200 MG CAPS Take 200 mg by mouth daily.     diltiazem (CARDIZEM CD) 240 MG 24 hr capsule TAKE 1 CAPSULE(240 MG) BY MOUTH DAILY 90 capsule 2   estradiol (ESTRACE) 0.1 MG/GM vaginal cream Place 1 Applicatorful vaginally 2 (two) times a week.     famotidine (PEPCID) 20 MG tablet Take 1 tablet (20 mg total) by mouth 2 (two) times daily as needed for heartburn or indigestion. Pt takes before bed daily 180 tablet 0   Hypromellose (ARTIFICIAL TEARS OP) Place 1 drop into both eyes 2 (two) times daily.     Probiotic Product (PROBIOTIC DAILY PO) Take 1 capsule by mouth daily.      REPATHA SURECLICK 242 MG/ML SOAJ INJECT 1 DOSE INTO THE SKIN EVERY 14 DAYS 2 mL 11   predniSONE (STERAPRED UNI-PAK 21 TAB) 10 MG (21) TBPK tablet Stand taper over 6 days. (Patient not taking: Reported on 04/09/2021) 21 tablet 0   No facility-administered medications prior to visit.    Allergies  Allergen Reactions   Morphine And Related Swelling   Statins Other (See Comments)    Myalgias, weakness (atorvastatin, RYR, rosuvastatin, pravastatin)   Sulfa Antibiotics Swelling and Rash    Review of Systems   Constitutional:  Negative for chills, fever and malaise/fatigue.  HENT:  Negative for congestion, sinus pain and sore throat.   Eyes:  Negative for blurred vision.  Respiratory:  Negative for cough and shortness of breath.   Cardiovascular:  Negative for chest pain, palpitations and leg swelling.  Gastrointestinal:  Negative for blood in  stool, diarrhea, nausea and vomiting.  Genitourinary:  Negative for flank pain and frequency.  Musculoskeletal:  Negative for back pain.  Skin:  Negative for rash.  Neurological:  Negative for headaches.      Objective:    Physical Exam Constitutional:      Appearance: Normal appearance.  HENT:     Head: Normocephalic and atraumatic.     Right Ear: External ear normal.     [name] Ear: External ear normal.  Pulmonary:     Effort: Pulmonary effort is normal.  Musculoskeletal:        General: Normal range of motion.     Cervical back: Normal range of motion.  Skin:    General: Skin is dry.  Neurological:     Mental Status: She is alert and oriented to person, place, and time.  Psychiatric:        Behavior: Behavior normal.    BP 116/71   Pulse 61   Temp 98.4 F (36.9 C)   Ht 5\' 1"  (1.549 m)   Wt 136 lb (61.7 kg)   BMI 25.70 kg/m  Wt Readings from Last 3 Encounters:  04/17/21 136 lb (61.7 kg)  04/09/21 137 lb 3.2 oz (62.2 kg)  03/18/21 137 lb (62.1 kg)    Diabetic Foot Exam - Simple   No data filed    Lab Results  Component Value Date   WBC 8.3 01/14/2021   HGB 12.7 01/14/2021   HCT 37.6 01/14/2021   PLT 169.0 01/14/2021   GLUCOSE 100 (H) 01/14/2021   CHOL 144 01/14/2021   TRIG 191.0 (H) 01/14/2021   HDL 49.90 01/14/2021   LDLDIRECT 137.0 12/25/2019   LDLCALC 56 01/14/2021   ALT 11 01/14/2021   AST 17 01/14/2021   NA 136 01/14/2021   K 4.3 01/14/2021   CL 101 01/14/2021   CREATININE 0.84 01/14/2021   BUN 10 01/14/2021   CO2 26 01/14/2021   TSH 1.78 01/14/2021   HGBA1C 5.9 01/14/2021    Lab Results  Component  Value Date   TSH 1.78 01/14/2021   Lab Results  Component Value Date   WBC 8.3 01/14/2021   HGB 12.7 01/14/2021   HCT 37.6 01/14/2021   MCV 92.9 01/14/2021   PLT 169.0 01/14/2021   Lab Results  Component Value Date   NA 136 01/14/2021   K 4.3 01/14/2021   CO2 26 01/14/2021   GLUCOSE 100 (H) 01/14/2021   BUN 10 01/14/2021   CREATININE 0.84 01/14/2021   BILITOT 0.7 01/14/2021   ALKPHOS 76 01/14/2021   AST 17 01/14/2021   ALT 11 01/14/2021   PROT 6.6 01/14/2021   ALBUMIN 4.4 01/14/2021   CALCIUM 9.3 01/14/2021   ANIONGAP 10 02/22/2018   GFR 66.46 01/14/2021   Lab Results  Component Value Date   CHOL 144 01/14/2021   Lab Results  Component Value Date   HDL 49.90 01/14/2021   Lab Results  Component Value Date   LDLCALC 56 01/14/2021   Lab Results  Component Value Date   TRIG 191.0 (H) 01/14/2021   Lab Results  Component Value Date   CHOLHDL 3 01/14/2021   Lab Results  Component Value Date   HGBA1C 5.9 01/14/2021       Assessment & Plan:   Problem List Items Addressed This Visit     Hypertension    Monitor and report any concerns. no changes to meds. Encouraged heart healthy diet such as the DASH diet and exercise as tolerated.  Hyperglycemia    hgba1c acceptable, minimize simple carbs. Increase exercise as tolerated. Reviewed previous labs which were acceptable and she continues to eat wella nd stay active so we will wait to draw labs at next visit      Vitamin D deficiency    Supplement and monitor      Nonintractable headache    Her headaches improved with course of physical therapy. Encouraged increased hydration, 64 ounces of clear fluids daily. Minimize alcohol and caffeine. Eat small frequent meals with lean proteins and complex carbs. Avoid high and low blood sugars. Get adequate sleep, 7-8 hours a night. Needs exercise daily preferably in the morning.         No orders of the defined types were placed in this encounter.   I  discussed the assessment and treatment plan with the patient. The patient was provided an opportunity to ask questions and all were answered. The patient agreed with the plan and demonstrated an understanding of the instructions.   The patient was advised to call back or seek an in-person evaluation if the symptoms worsen or if the condition fails to improve as anticipated.    Penni Homans, MD [facility] [phone] (phone) [phone] (fax)  [place], Suezanne Jacquet, acting as a scribe for Penni Homans, MD, have documented all relevent documentation on behalf of Penni Homans, MD, as directed by Penni Homans, MD while in the presence of Penni Homans, MD. DO:04/18/21.  I, Mosie Lukes, MD personally performed the services described in this documentation. All medical record entries made by the scribe were at my direction and in my presence. I have reviewed the chart and agree that the record reflects my personal performance and is accurate and complete

## 2021-04-18 ENCOUNTER — Ambulatory Visit (HOSPITAL_COMMUNITY): Payer: Medicare HMO

## 2021-04-18 NOTE — Assessment & Plan Note (Signed)
hgba1c acceptable, minimize simple carbs. Increase exercise as tolerated. Reviewed previous labs which were acceptable and she continues to eat wella nd stay active so we will wait to draw labs at next visit

## 2021-04-18 NOTE — Assessment & Plan Note (Signed)
Monitor and report any concerns. no changes to meds. Encouraged heart healthy diet such as the DASH diet and exercise as tolerated.  

## 2021-04-18 NOTE — Assessment & Plan Note (Signed)
Supplement and monitor 

## 2021-04-18 NOTE — Assessment & Plan Note (Signed)
Her headaches improved with course of physical therapy. Encouraged increased hydration, 64 ounces of clear fluids daily. Minimize alcohol and caffeine. Eat small frequent meals with lean proteins and complex carbs. Avoid high and low blood sugars. Get adequate sleep, 7-8 hours a night. Needs exercise daily preferably in the morning.

## 2021-04-21 ENCOUNTER — Ambulatory Visit (INDEPENDENT_AMBULATORY_CARE_PROVIDER_SITE_OTHER): Payer: Medicare HMO

## 2021-04-21 DIAGNOSIS — R55 Syncope and collapse: Secondary | ICD-10-CM | POA: Diagnosis not present

## 2021-04-21 LAB — CUP PACEART REMOTE DEVICE CHECK
Date Time Interrogation Session: 20221017011641
Implantable Pulse Generator Implant Date: 20191018

## 2021-04-23 ENCOUNTER — Other Ambulatory Visit: Payer: Self-pay

## 2021-04-23 ENCOUNTER — Ambulatory Visit (HOSPITAL_COMMUNITY)
Admission: RE | Admit: 2021-04-23 | Discharge: 2021-04-23 | Disposition: A | Discharge status: Home / Self Care | Payer: Medicare HMO | Source: Ambulatory Visit | Attending: Cardiology | Admitting: Cardiology

## 2021-04-23 DIAGNOSIS — R0989 Other specified symptoms and signs involving the circulatory and respiratory systems: Secondary | ICD-10-CM | POA: Diagnosis not present

## 2021-04-24 ENCOUNTER — Telehealth: Payer: Self-pay

## 2021-04-24 NOTE — Telephone Encounter (Signed)
I received some repatha forms but they were filled out incorrectly so I lmom the pt that we missed a signature and some financial information

## 2021-04-25 ENCOUNTER — Ambulatory Visit: Payer: Medicare HMO | Attending: Sports Medicine | Admitting: Physical Therapy

## 2021-04-25 ENCOUNTER — Other Ambulatory Visit: Payer: Self-pay

## 2021-04-25 ENCOUNTER — Encounter: Payer: Self-pay | Admitting: Physical Therapy

## 2021-04-25 DIAGNOSIS — M6281 Muscle weakness (generalized): Secondary | ICD-10-CM | POA: Insufficient documentation

## 2021-04-25 DIAGNOSIS — M25651 Stiffness of right hip, not elsewhere classified: Secondary | ICD-10-CM | POA: Insufficient documentation

## 2021-04-25 DIAGNOSIS — R2689 Other abnormalities of gait and mobility: Secondary | ICD-10-CM | POA: Insufficient documentation

## 2021-04-25 DIAGNOSIS — R262 Difficulty in walking, not elsewhere classified: Secondary | ICD-10-CM | POA: Insufficient documentation

## 2021-04-25 DIAGNOSIS — M25551 Pain in right hip: Secondary | ICD-10-CM | POA: Diagnosis not present

## 2021-04-25 NOTE — Patient Instructions (Signed)
   Access Code: [facility] URL: [email or web address] Date: 04/25/2021 Prepared by: Annie Paras  Exercises Hooklying Hamstring Stretch with Strap - 2-3 x daily - 7 x weekly - 3 reps - 30 sec hold Supine ITB Stretch with Strap - 2-3 x daily - 7 x weekly - 3 reps - 30 sec hold Supine Piriformis Stretch with Foot on Ground - 2-3 x daily - 7 x weekly - 3 reps - 30 sec hold Supine Figure 4 Piriformis Stretch - 2-3 x daily - 7 x weekly - 3 reps - 30 sec hold Modified Thomas Stretch - 2-3 x daily - 7 x weekly - 3 reps - 30 sec hold

## 2021-04-25 NOTE — Therapy (Addendum)
[facility] [address]  [place], [place], [address] Phone: [phone]   Fax:  [phone]  Physical Therapy Evaluation  Patient Details  Name: Sophia [name] MRN: [id number] Date of Birth: June 09, 1941 Referring Provider (PT): Vickki Hearing, MD   Encounter Date: 04/25/2021   PT End of Session - 04/25/21 0930     Visit Number 1    Number of Visits 13    Date for PT Re-Evaluation 06/06/21    Authorization Type [employer]    PT Start Time 0930    PT Stop Time 6606    PT Time Calculation (min) 68 min    Activity Tolerance Patient tolerated treatment well    Behavior During Therapy [facility] for tasks assessed/performed             Past Medical History:  Diagnosis Date   Anxiety and depression    Arthritis    hands, knees, etc   Bladder prolapse, female, acquired 09/16/2014   Bowen's disease    Diverticulitis    Diverticulosis    GERD (gastroesophageal reflux disease)    H/O hematuria    for several years, work only revealed kidney stone on right side   History of chicken pox    History of kidney stones    Hyperlipidemia    Hypertension    Leg cramps, sleep related 04/02/2016   Myalgia 04/25/2017   Osteopenia 06/26/2014   Osteoporosis 06/26/2014   Plantar wart of left foot 10/15/2016   Right shoulder pain 04/23/2017   SCC (squamous cell carcinoma)    arms   Statin intolerance 09/11/2019    Past Surgical History:  Procedure Laterality Date   CHOLECYSTECTOMY  2005   LOOP RECORDER INSERTION N/A 04/15/2018   Procedure: LOOP RECORDER INSERTION;  Surgeon: Sanda Klein, MD;  Location: [place];  Service: Cardiovascular;  Laterality: N/A;   SKIN BIOPSY     multiple    There were no vitals filed for this visit.    Subjective Assessment - 04/25/21 0935     Subjective Pt reports she was helping her handicapped brother move about a month ago and feels like she may have overdone things.  That night she was trying to pull something out from under her bed and had sudden onset of pain in her R hip. She went to bed that night and slept fitfully and when she got up the next morning, she was unable to bear weight on her L LE. She had to use her dtr's walker for several days. Visit to MD revealed hetertropic osteophytes at hip from prior trauma (h/o 2 remote falls) which are likely irritating her bursa, as well as probable labral tear.    Limitations Sitting;Standing;Walking;House hold activities    How long can you sit comfortably? 15-20 minutes    How long can you stand comfortably? up to 1 hr    How long can you walk comfortably? ~1/2 mile    Diagnostic tests 03/18/21 - R hip x-ray: 1. No acute osseous abnormality.  2. Heterotopic ossification about the right greater trochanter, in  keeping with sequelae of prior trauma but increased compared to  prior examination.    Patient Stated Goals "to get my muscles stronger and avoid surgery"    Currently in Pain? No/denies    Pain Score 0-No pain   up to 6/10   Pain Location Hip    Pain Orientation Right    Pain Descriptors /  Indicators Pressure;Tender   "vice grip"   Pain Type Acute pain;Chronic pain    Pain Radiating Towards n/a recently but some radicular pain down back of LE with numbness in L foot at time of initial flare-up    Pain Onset More than a month ago    Pain Frequency Intermittent    Aggravating Factors  up/down stairs, esp when carrying something; walking up incline, fatigue    Pain Relieving Factors ice and heat, noted good relief from prednisone dose pack    Effect of Pain on Daily Activities unable to squat, difficulty with climbing stair/walking up inclines, laying on R side on floor to exercise (not so bad in bed), finds herself holding/rubbing her hip while walking                [facility] PT Assessment - 04/25/21 0930       Assessment   Medical Diagnosis R hip pain    Referring Provider (PT) Vickki Hearing, MD     Onset Date/Surgical Date 03/17/21    Hand Dominance Right    Next MD Visit PRN    Prior Therapy recent PT for cervicalgia & headaches      Precautions   Precautions None      Restrictions   Weight Bearing Restrictions No      Balance Screen   Has the patient fallen in the past 6 months No    Has the patient had a decrease in activity level because of a fear of falling?  No    Is the patient reluctant to leave their home because of a fear of falling?  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   Overall Cognitive Status Within Functional Limits for tasks assessed      Observation/Other Assessments   Focus on Therapeutic Outcomes (FOTO)  Hip = 52; predicted D/C FS=67      AROM   Overall AROM Comments B hip ROM mosly WFL other than R hip limited in IT & ER    AROM Assessment Site Lumbar;Hip    Lumbar Flexion fingertips to toes    Lumbar Extension 30% limited    Lumbar - Right Side Bend WFL but slight R hip discomfort    Lumbar - Left Side Bend WFL    Lumbar - Right Rotation WFL    Lumbar - Left Rotation 25% limited      Strength   Strength Assessment Site Hip;Knee;Ankle    Right/Left Hip Right;Left    Right Hip Flexion 4/5    Right Hip Extension 4-/5    Right Hip External Rotation  4-/5    Right Hip Internal Rotation 4/5    Right Hip ABduction 4-/5    Right Hip ADduction 3+/5    Left Hip Flexion 4/5    Left Hip Extension 4-/5    Left Hip External Rotation 4-/5    Left Hip Internal Rotation 4/5  Left Hip ABduction  4-/5    Left Hip ADduction 4-/5    Right/Left Knee Right;Left    Right Knee Flexion 5/5    Right Knee Extension 5/5    Left Knee Flexion 5/5    Left Knee Extension 5/5    Right/Left Ankle Right;Left    Right Ankle Dorsiflexion 4/5    Right Ankle Plantar Flexion 5/5    Left Ankle Dorsiflexion 4/5    Left Ankle Plantar Flexion 4+/5      Flexibility   Soft Tissue Assessment /Muscle Length yes    Hamstrings mild/mod tight B    Quadriceps mild/mod tight quads & hip flexors B    ITB mild/mod tight R>L with pain onR    Piriformis mod tight R + pain; mild tight L    Quadratus Lumborum WFL    Obturator Internus mild/mod tight B      Palpation   Palpation comment TTP over R>L greater trochanter, ITB & proximal/distal hip flexors      Special Tests    Special Tests Hip Special Tests    Hip Special Tests  Saralyn Pilar (FABER) Test      Saralyn Pilar [facility]) Test   Findings Positive    Side Right                        Objective measurements completed on examination: See above findings.       [facility] Adult PT Treatment/Exercise - 04/25/21 0930       Exercises   Exercises Knee/Hip      Knee/Hip Exercises: Stretches   Passive Hamstring Stretch Right;1 rep;30 seconds    Passive Hamstring Stretch Limitations hooklying with strap    Hip Flexor Stretch Right;1 rep;30 seconds    Hip Flexor Stretch Limitations mod thomas    ITB Stretch Right;1 rep;30 seconds    ITB Stretch Limitations supine cross-body with strap    Piriformis Stretch Right;2 reps;30 seconds    Piriformis Stretch Limitations hooklying KTOS & figure-4 + gentle overpressure                     PT Education - 04/25/21 1036     Education Details PT eval findings, anitipated POC & initial HEP - Access Code: [email or web address]    Person(s) Educated Patient    Methods Explanation;Demonstration;Verbal cues;Handout    Comprehension Verbalized understanding;Verbal cues required;Returned demonstration;Need further  instruction              PT Short Term Goals - 04/25/21 1038       PT SHORT TERM GOAL #1   Title Patient will be independent with initial HEP    Status New    Target Date 05/16/21               PT Long Term Goals - 04/25/21 1212       PT LONG TERM GOAL #1   Title Patient will be independent with ongoing/advanced HEP for self-management at home in order to build upon functional gains in therapy    Status New    Target Date 06/06/21      PT LONG TERM GOAL #2   Title Patient to report reduction in frequency and intensity of R hip pain by >/= 50-75% to allow for improved activity tolerance    Status New    Target Date 06/06/21      PT LONG TERM GOAL #3   Title Patient will demonstrate improved B hip strength  to >/= 4+/5 for improved stability and ease of mobility    Status New    Target Date 06/06/21      PT LONG TERM GOAL #4   Title Patient will report ability to complete walking for exercise at her prior level w/o limitation due to R hip pain or weakness    Status New    Target Date 06/06/21      PT LONG TERM GOAL #5   Title Patient to report ability to climb stairs without increased R hip pain    Status New    Target Date 06/06/21                    Plan - 04/25/21 1038     Clinical Impression Statement Ritha is a 80 y/o female who presents to OP PT for acute R hip pain. Onset of pain occurred following a busy day helping her brother move followed by a strain to her hip while attempting to pull something out from under the bed. Pain very severe initially causing her to have to use her daughter's RW to walk but improved with steroid dose pack. Pt reports MD attributes pain to heterotrophic osteophytes causing irritation to her trochanteric bursa as well as probable labral injury. Pain currently intermittent, usually associated with climbing stairs or walking long distances, especially up inclines. Deficits include intermittent R>L hip pain with TTP over  R>L GT, ITB, and proximal/distal hip flexors, mildly limited lumbar and R hip ROM, decreased proximal LE flexibility and mild to moderate proximal LE weakness. Pain limits standing tolerance, gait (esp on uneven surfaces/inclines), stairs and positional tolerance with laying on R side to exercise on the floor. Lashon will benefit from skilled PT to address above deficits to improve proximal LE flexibility and decrease abnormal muscle tension, as well as improve strength for better stability and to reduce B hip pain for increased activity tolerance.    Personal Factors and Comorbidities Time since onset of injury/illness/exacerbation;Past/Current Experience;Comorbidity 3+;Age;Social Background    Comorbidities HTN, a-fib, NSVT, near syncope, GERD, diverticulosis, OA, osteoporosis, chronic LBP w/o sciatica, chronic hip pain, Raynaud phenomenon, sleep related leg cramps    Examination-Activity Limitations Bend;Lift;Carry;Sit;Stand;Locomotion Level;Stairs;Squat    Examination-Participation Restrictions Cleaning;Community Activity;Laundry;Meal Prep;Shop;Volunteer;Yard Work    Stability/Clinical Decision Making Stable/Uncomplicated    Clinical Decision Making Low    Rehab Potential Good    PT Frequency 2x / week    PT Duration 6 weeks    PT Treatment/Interventions ADLs/Self Care Home Management;Cryotherapy;Electrical Stimulation;Iontophoresis 4mg /ml Dexamethasone;Moist Heat;Ultrasound;DME Instruction;Gait training;Stair training;Functional mobility training;Therapeutic activities;Therapeutic exercise;Balance training;Neuromuscular re-education;Patient/family education;Manual techniques;Passive range of motion;Dry needling;Taping;Joint Manipulations    PT Next Visit Plan Review initial HEP; initiate gentle hip/lumbopelvic strengthening; manual therapy and modalities PRN including possible ionto patch if approved by MD    PT Home Exercise Plan Access Code: [email or web address] (10/28)    Consulted and Agree with Plan of  Care Patient             Patient will benefit from skilled therapeutic intervention in order to improve the following deficits and impairments:  Decreased activity tolerance, Decreased mobility, Decreased range of motion, Decreased strength, Difficulty walking, Increased fascial restricitons, Increased muscle spasms, Impaired perceived functional ability, Impaired flexibility, Improper body mechanics, Postural dysfunction, Pain  Visit Diagnosis: Pain in right hip - Plan: PT plan of care cert/re-cert  Stiffness of right hip, not elsewhere classified - Plan: PT plan of care cert/re-cert  Muscle weakness (generalized) - Plan: PT plan of care  cert/re-cert  Other abnormalities of gait and mobility - Plan: PT plan of care cert/re-cert  Difficulty in walking, not elsewhere classified - Plan: PT plan of care cert/re-cert     Problem List Patient Active Problem List   Diagnosis Date Noted   Elevated coronary artery calcium score 04/09/2021   Nonintractable headache 01/14/2021   Allergic drug reaction 03/26/2020   Vitamin D deficiency 12/28/2019   Complex renal cyst 12/28/2019   Elevated liver function tests 12/22/2019   Bug bite 12/22/2019   Persistent atrial fibrillation ([place]) 11/13/2019   Secondary hypercoagulable state ([place]) 11/13/2019   Encounter for loop recorder check 10/20/2019   Long term (current) use of anticoagulants 10/20/2019   Chronic bilateral low back pain without sciatica 09/24/2019   Statin intolerance 09/11/2019   Diarrhea 08/08/2018   Raynaud phenomenon 08/08/2018   Pulsatile tinnitus 04/26/2018   Hyperglycemia 04/26/2018   Cervicalgia 04/26/2018   Near syncope 04/15/2018   NSVT (nonsustained ventricular tachycardia) 04/14/2018   Palpitations 02/25/2018   Bilateral carotid bruits 02/25/2018   Myalgia 04/25/2017   Left shoulder pain 04/23/2017   Plantar wart of left foot 10/15/2016   Leg cramps, sleep related 04/02/2016   Hip pain 05/29/2015    Preventative health care 03/24/2015   Bladder prolapse, female, acquired 09/16/2014   Osteoporosis 06/26/2014   Arthritis    GERD (gastroesophageal reflux disease)    Hypertension    Hyperlipidemia    History of kidney stones    H/O hematuria    Diverticulosis    History of chicken pox    Anxiety and depression    SCC (squamous cell carcinoma)    Bowen's disease     Percival Spanish, PT 04/25/2021, 12:45 PM  [facility] [address]  [place], [place], [address] Phone: [phone]   Fax:  [phone]  Name: KHALEESI GRUEL MRN: [id number] Date of Birth: [birth year]

## 2021-04-28 NOTE — Telephone Encounter (Signed)
Patient assistance application for [employer] sureclick faxed to [employer]

## 2021-04-29 NOTE — Progress Notes (Signed)
Carelink Summary Report / Loop Recorder 

## 2021-04-30 ENCOUNTER — Ambulatory Visit: Payer: Medicare HMO | Attending: Sports Medicine

## 2021-04-30 ENCOUNTER — Other Ambulatory Visit: Payer: Self-pay

## 2021-04-30 ENCOUNTER — Telehealth: Payer: Self-pay

## 2021-04-30 DIAGNOSIS — R2689 Other abnormalities of gait and mobility: Secondary | ICD-10-CM | POA: Insufficient documentation

## 2021-04-30 DIAGNOSIS — R262 Difficulty in walking, not elsewhere classified: Secondary | ICD-10-CM | POA: Diagnosis not present

## 2021-04-30 DIAGNOSIS — M25551 Pain in right hip: Secondary | ICD-10-CM | POA: Insufficient documentation

## 2021-04-30 DIAGNOSIS — M25651 Stiffness of right hip, not elsewhere classified: Secondary | ICD-10-CM | POA: Diagnosis not present

## 2021-04-30 DIAGNOSIS — M6281 Muscle weakness (generalized): Secondary | ICD-10-CM | POA: Insufficient documentation

## 2021-04-30 NOTE — Therapy (Signed)
[facility] [address]  [place], [place], [address] Phone: [phone]   Fax:  [phone]  Physical Therapy Treatment  Patient Details  Name: Sophia [name] MRN: [id number] Date of Birth: 22-Mar-1941 Referring Provider (PT): Vickki Hearing, MD   Encounter Date: 04/30/2021   PT End of Session - 04/30/21 1017     Visit Number 2    Number of Visits 13    Date for PT Re-Evaluation 06/06/21    Authorization Type [employer]    Authorization Time Period 12 visits approved from 11.2.22 - 12.9.22    Authorization - Visit Number 1    Authorization - Number of Visits 12    Progress Note Due on Visit 10    PT Start Time 0932    PT Stop Time 4403    PT Time Calculation (min) 43 min    Activity Tolerance Patient tolerated treatment well;Patient limited by pain    Behavior During Therapy [facility] for tasks assessed/performed             Past Medical History:  Diagnosis Date   Anxiety and depression    Arthritis    hands, knees, etc   Bladder prolapse, female, acquired 09/16/2014   Bowen's disease    Diverticulitis    Diverticulosis    GERD (gastroesophageal reflux disease)    H/O hematuria    for several years, work only revealed kidney stone on right side   History of chicken pox    History of kidney stones    Hyperlipidemia    Hypertension    Leg cramps, sleep related 04/02/2016   Myalgia 04/25/2017   Osteopenia 06/26/2014   Osteoporosis 06/26/2014   Plantar wart of left foot 10/15/2016   Right shoulder pain 04/23/2017   SCC (squamous cell carcinoma)    arms   Statin intolerance 09/11/2019    Past Surgical History:  Procedure Laterality Date   CHOLECYSTECTOMY  2005   LOOP RECORDER INSERTION N/A 04/15/2018   Procedure: LOOP RECORDER INSERTION;  Surgeon: Sanda Klein, MD;  Location: [facility];  Service: Cardiovascular;  Laterality: N/A;   SKIN BIOPSY     multiple    There were no vitals  filed for this visit.   Subjective Assessment - 04/30/21 0934     Subjective Pt reports that both of her hips are sore and stiff today, doesn't feel pain.    Diagnostic tests 03/18/21 - R hip x-ray: 1. No acute osseous abnormality.  2. Heterotopic ossification about the right greater trochanter, in  keeping with sequelae of prior trauma but increased compared to  prior examination.    Patient Stated Goals "to get my muscles stronger and avoid surgery"    Currently in Pain? No/denies                               [facility] Adult PT Treatment/Exercise - 04/30/21 0001       Exercises   Exercises Knee/Hip      Neck Exercises: Machines for Strengthening   Nustep --      Lumbar Exercises: Stretches   Lower Trunk Rotation Limitations 10x3"    Pelvic Tilt 10 reps    Pelvic Tilt Limitations 3" hold; TC and demo for technique      Knee/Hip Exercises: Stretches   Passive Hamstring Stretch Right;Left;30 seconds    Passive Hamstring Stretch Limitations hooklying with strap  Hip Flexor Stretch Right;30 seconds    Hip Flexor Stretch Limitations runner stretch    ITB Stretch Right;Left;30 seconds    ITB Stretch Limitations supine cross-body with strap    Piriformis Stretch Right;Left;30 seconds    Piriformis Stretch Limitations hooklying KTOS & figure-4 + gentle overpressure    Gastroc Stretch Right;30 seconds    Gastroc Stretch Limitations runner stretch      Knee/Hip Exercises: Aerobic   Nustep L1x84min      Knee/Hip Exercises: Sidelying   Other Sidelying Knee/Hip Exercises R HF stretch in ober position 30 sec hold      Manual Therapy   Manual Therapy Soft tissue mobilization    Soft tissue mobilization STM to R ITB, lateral quads, and glutes                       PT Short Term Goals - 04/30/21 1026       PT SHORT TERM GOAL #1   Title Patient will be independent with initial HEP    Status On-going    Target Date 05/16/21               PT  Long Term Goals - 04/30/21 1026       PT LONG TERM GOAL #1   Title Patient will be independent with ongoing/advanced HEP for self-management at home in order to build upon functional gains in therapy    Status On-going      PT LONG TERM GOAL #2   Title Patient to report reduction in frequency and intensity of R hip pain by >/= 50-75% to allow for improved activity tolerance    Status On-going      PT LONG TERM GOAL #3   Title Patient will demonstrate improved B hip strength to >/= 4+/5 for improved stability and ease of mobility    Status On-going      PT LONG TERM GOAL #4   Title Patient will report ability to complete walking for exercise at her prior level w/o limitation due to R hip pain or weakness    Status On-going      PT LONG TERM GOAL #5   Title Patient to report ability to climb stairs without increased R hip pain    Status On-going                   Plan - 04/30/21 1021     Clinical Impression Statement Reviewed inital stretches today with pt, only pain noted with R ITB an hamstring stretch but able to modify to decrease pain levels. Cues needed for proper movement with the pelvic tilts. Did some STM to her R ITB and lateral hip, she noted much improvement in her hip pain and mobility after manual work. Followed manual with hip flexor stretching to further decrease pain. Cues for upright stance with gastroc/hip flexor stretch today. Pt had good response to her first treatment.    Personal Factors and Comorbidities Time since onset of injury/illness/exacerbation;Past/Current Experience;Comorbidity 3+;Age;Social Background    Comorbidities HTN, a-fib, NSVT, near syncope, GERD, diverticulosis, OA, osteoporosis, chronic LBP w/o sciatica, chronic hip pain, Raynaud phenomenon, sleep related leg cramps    PT Frequency 2x / week    PT Duration 6 weeks    PT Treatment/Interventions ADLs/Self Care Home Management;Cryotherapy;Electrical Stimulation;Iontophoresis 4mg /ml  Dexamethasone;Moist Heat;Ultrasound;DME Instruction;Gait training;Stair training;Functional mobility training;Therapeutic activities;Therapeutic exercise;Balance training;Neuromuscular re-education;Patient/family education;Manual techniques;Passive range of motion;Dry needling;Taping;Joint Manipulations    PT Next Visit Plan initiate  gentle hip/lumbopelvic strengthening; manual therapy and modalities PRN including possible ionto patch if approved by MD    PT Home Exercise Plan Access Code: [email or web address] (10/28)    Consulted and Agree with Plan of Care Patient             Patient will benefit from skilled therapeutic intervention in order to improve the following deficits and impairments:  Decreased activity tolerance, Decreased mobility, Decreased range of motion, Decreased strength, Difficulty walking, Increased fascial restricitons, Increased muscle spasms, Impaired perceived functional ability, Impaired flexibility, Improper body mechanics, Postural dysfunction, Pain  Visit Diagnosis: Pain in right hip  Stiffness of right hip, not elsewhere classified  Muscle weakness (generalized)  Other abnormalities of gait and mobility  Difficulty in walking, not elsewhere classified     Problem List Patient Active Problem List   Diagnosis Date Noted   Elevated coronary artery calcium score 04/09/2021   Nonintractable headache 01/14/2021   Allergic drug reaction 03/26/2020   Vitamin D deficiency 12/28/2019   Complex renal cyst 12/28/2019   Elevated liver function tests 12/22/2019   Bug bite 12/22/2019   Persistent atrial fibrillation ([place]) 11/13/2019   Secondary hypercoagulable state ([place]) 11/13/2019   Encounter for loop recorder check 10/20/2019   Long term (current) use of anticoagulants 10/20/2019   Chronic bilateral low back pain without sciatica 09/24/2019   Statin intolerance 09/11/2019   Diarrhea 08/08/2018   Raynaud phenomenon 08/08/2018   Pulsatile tinnitus 04/26/2018    Hyperglycemia 04/26/2018   Cervicalgia 04/26/2018   Near syncope 04/15/2018   NSVT (nonsustained ventricular tachycardia) 04/14/2018   Palpitations 02/25/2018   Bilateral carotid bruits 02/25/2018   Myalgia 04/25/2017   Left shoulder pain 04/23/2017   Plantar wart of left foot 10/15/2016   Leg cramps, sleep related 04/02/2016   Hip pain 05/29/2015   Preventative health care 03/24/2015   Bladder prolapse, female, acquired 09/16/2014   Osteoporosis 06/26/2014   Arthritis    GERD (gastroesophageal reflux disease)    Hypertension    Hyperlipidemia    History of kidney stones    H/O hematuria    Diverticulosis    History of chicken pox    Anxiety and depression    SCC (squamous cell carcinoma)    Bowen's disease     Artist Pais, PTA 04/30/2021, 10:30 AM  [facility] [address]  [place], [place], [address] Phone: [phone]   Fax:  [phone]  Name: Sophia [name] MRN: [id number] Date of Birth: [birth year]

## 2021-04-30 NOTE — Telephone Encounter (Signed)
Sophia [name] pt assistance form filled out and sign by Dr. Gwenlyn Found. Faxed today.

## 2021-05-02 ENCOUNTER — Other Ambulatory Visit: Payer: Self-pay

## 2021-05-02 ENCOUNTER — Encounter: Payer: Self-pay | Admitting: Physical Therapy

## 2021-05-02 ENCOUNTER — Ambulatory Visit: Payer: Medicare HMO | Admitting: Physical Therapy

## 2021-05-02 DIAGNOSIS — M25551 Pain in right hip: Secondary | ICD-10-CM | POA: Diagnosis not present

## 2021-05-02 DIAGNOSIS — M6281 Muscle weakness (generalized): Secondary | ICD-10-CM | POA: Diagnosis not present

## 2021-05-02 DIAGNOSIS — M25651 Stiffness of right hip, not elsewhere classified: Secondary | ICD-10-CM

## 2021-05-02 DIAGNOSIS — R262 Difficulty in walking, not elsewhere classified: Secondary | ICD-10-CM | POA: Diagnosis not present

## 2021-05-02 DIAGNOSIS — R2689 Other abnormalities of gait and mobility: Secondary | ICD-10-CM

## 2021-05-02 NOTE — Therapy (Signed)
[facility] [address]  [place], [place], [address] Phone: [phone]   Fax:  [phone]  Physical Therapy Treatment  Patient Details  Name: Sophia [name] MRN: [id number] Date of Birth: 16-Mar-1941 Referring Provider (PT): Vickki Hearing, MD   Encounter Date: 05/02/2021   PT End of Session - 05/02/21 0937     Visit Number 3    Number of Visits 13    Date for PT Re-Evaluation 06/06/21    Authorization Type [employer]    Authorization Time Period 04/29/21 - 06/06/21    Authorization - Visit Number 2    Authorization - Number of Visits 12    Progress Note Due on Visit 10    PT Start Time 3817    PT Stop Time 1016    PT Time Calculation (min) 45 min    Activity Tolerance Patient tolerated treatment well    Behavior During Therapy [facility] for tasks assessed/performed             Past Medical History:  Diagnosis Date   Anxiety and depression    Arthritis    hands, knees, etc   Bladder prolapse, female, acquired 09/16/2014   Bowen's disease    Diverticulitis    Diverticulosis    GERD (gastroesophageal reflux disease)    H/O hematuria    for several years, work only revealed kidney stone on right side   History of chicken pox    History of kidney stones    Hyperlipidemia    Hypertension    Leg cramps, sleep related 04/02/2016   Myalgia 04/25/2017   Osteopenia 06/26/2014   Osteoporosis 06/26/2014   Plantar wart of left foot 10/15/2016   Right shoulder pain 04/23/2017   SCC (squamous cell carcinoma)    arms   Statin intolerance 09/11/2019    Past Surgical History:  Procedure Laterality Date   CHOLECYSTECTOMY  2005   LOOP RECORDER INSERTION N/A 04/15/2018   Procedure: LOOP RECORDER INSERTION;  Surgeon: Sanda Klein, MD;  Location: [facility];  Service: Cardiovascular;  Laterality: N/A;   SKIN BIOPSY     multiple    There were no vitals filed for this visit.   Subjective Assessment  - 05/02/21 0935     Subjective Pt reports she woke up sore this morning but since she has been moving around it is no longer hurting.    Diagnostic tests 03/18/21 - R hip x-ray: 1. No acute osseous abnormality.  2. Heterotopic ossification about the right greater trochanter, in  keeping with sequelae of prior trauma but increased compared to  prior examination.    Patient Stated Goals "to get my muscles stronger and avoid surgery"    Currently in Pain? No/denies                               [facility] Adult PT Treatment/Exercise - 05/02/21 0931       Knee/Hip Exercises: Stretches   ITB Stretch Right;2 reps;30 seconds    ITB Stretch Limitations standing lateral lean with counter support    Other Knee/Hip Stretches Supine B hip adductor butterfly stretch 2 x 30 sec      Knee/Hip Exercises: Aerobic   Nustep L3 x 6 min (UE/LE)      Knee/Hip Exercises: Supine   Hip Adduction Isometric Both;10 reps;Strengthening   5" hold   Hip Adduction Isometric Limitations hooklying ball squeeze  Bridges Both;10 reps;Strengthening    Bridges Limitations + red TB hip ABD isometric    Bridges with [employer] Both;10 reps;Strengthening    Other Supine Knee/Hip Exercises R/L red TB hip ABD/ER alt unilateral clam 10 x 3"      Knee/Hip Exercises: Sidelying   Clams R/L red TB clam 10 x 3"      Manual Therapy   Manual Therapy Soft tissue mobilization;Myofascial release    Soft tissue mobilization STM to R ITB, lateral quads, glutes, hip flexors and adductors    Myofascial Release manual TPR to R TFL, proximal iliacus & distal iliopsoas, proximal hip adductors                     PT Education - 05/02/21 1015     Education Details HEP update - ITB stretch subsitution + hip adductor stretch & hip/lumbopelvic strengthening - Access Code: [email or web address]; Ionto patch information/education    Person(s) Educated Patient    Methods Explanation;Demonstration;Verbal cues;Tactile  cues;Handout   + HEP tracker handout   Comprehension Verbalized understanding;Verbal cues required;Tactile cues required;Returned demonstration;Need further instruction              PT Short Term Goals - 04/30/21 1026       PT SHORT TERM GOAL #1   Title Patient will be independent with initial HEP    Status On-going    Target Date 05/16/21               PT Long Term Goals - 05/02/21 1311       PT LONG TERM GOAL #1   Title Patient will be independent with ongoing/advanced HEP for self-management at home in order to build upon functional gains in therapy    Status On-going    Target Date 06/06/21      PT LONG TERM GOAL #2   Title Patient to report reduction in frequency and intensity of R hip pain by >/= 50-75% to allow for improved activity tolerance    Status On-going    Target Date 06/06/21      PT LONG TERM GOAL #3   Title Patient will demonstrate improved B hip strength to >/= 4+/5 for improved stability and ease of mobility    Status On-going    Target Date 06/06/21      PT LONG TERM GOAL #4   Title Patient will report ability to complete walking for exercise at her prior level w/o limitation due to R hip pain or weakness    Status On-going    Target Date 06/06/21      PT LONG TERM GOAL #5   Title Patient to report ability to climb stairs without increased R hip pain    Status On-going    Target Date 06/06/21                   Plan - 05/02/21 0938     Clinical Impression Statement Sophia [name] reports initial HEP going well other than some ongoing discomfort when she does the supine ITB stretch with the strap. Provided instruction in alternative ITB stretch from standing with pt reporting good stretch w/o pain therefore instructions provided for home performance. Progressed hip and lumbopelvic strengthening with good tolerance and HEP updated to include strengthening in addition to previously provided stretches. Manual therapy revealing mild TTP in hip  and proximal thigh musculature which was addressed with manual STM/MFR but no localized TTP over R greater trochanter, therefore deferred ionto patch for  now.    Comorbidities HTN, a-fib, NSVT, near syncope, GERD, diverticulosis, OA, osteoporosis, chronic LBP w/o sciatica, chronic hip pain, Raynaud phenomenon, sleep related leg cramps    Rehab Potential Good    PT Frequency 2x / week    PT Duration 6 weeks    PT Treatment/Interventions ADLs/Self Care Home Management;Cryotherapy;Electrical Stimulation;Iontophoresis 4mg /ml Dexamethasone;Moist Heat;Ultrasound;DME Instruction;Gait training;Stair training;Functional mobility training;Therapeutic activities;Therapeutic exercise;Balance training;Neuromuscular re-education;Patient/family education;Manual techniques;Passive range of motion;Dry needling;Taping;Joint Manipulations    PT Next Visit Plan progress hip/lumbopelvic flexibility and strengthening; manual therapy and modalities PRN including possible ionto patch    PT Home Exercise Plan Access Code: [email or web address] (10/28, updated 11/4)    Consulted and Agree with Plan of Care Patient             Patient will benefit from skilled therapeutic intervention in order to improve the following deficits and impairments:  Decreased activity tolerance, Decreased mobility, Decreased range of motion, Decreased strength, Difficulty walking, Increased fascial restricitons, Increased muscle spasms, Impaired perceived functional ability, Impaired flexibility, Improper body mechanics, Postural dysfunction, Pain  Visit Diagnosis: Pain in right hip  Stiffness of right hip, not elsewhere classified  Muscle weakness (generalized)  Other abnormalities of gait and mobility  Difficulty in walking, not elsewhere classified     Problem List Patient Active Problem List   Diagnosis Date Noted   Elevated coronary artery calcium score 04/09/2021   Nonintractable headache 01/14/2021   Allergic drug reaction 03/26/2020    Vitamin D deficiency 12/28/2019   Complex renal cyst 12/28/2019   Elevated liver function tests 12/22/2019   Bug bite 12/22/2019   Persistent atrial fibrillation ([place]) 11/13/2019   Secondary hypercoagulable state ([place]) 11/13/2019   Encounter for loop recorder check 10/20/2019   Long term (current) use of anticoagulants 10/20/2019   Chronic bilateral low back pain without sciatica 09/24/2019   Statin intolerance 09/11/2019   Diarrhea 08/08/2018   Raynaud phenomenon 08/08/2018   Pulsatile tinnitus 04/26/2018   Hyperglycemia 04/26/2018   Cervicalgia 04/26/2018   Near syncope 04/15/2018   NSVT (nonsustained ventricular tachycardia) 04/14/2018   Palpitations 02/25/2018   Bilateral carotid bruits 02/25/2018   Myalgia 04/25/2017   Left shoulder pain 04/23/2017   Plantar wart of left foot 10/15/2016   Leg cramps, sleep related 04/02/2016   Hip pain 05/29/2015   Preventative health care 03/24/2015   Bladder prolapse, female, acquired 09/16/2014   Osteoporosis 06/26/2014   Arthritis    GERD (gastroesophageal reflux disease)    Hypertension    Hyperlipidemia    History of kidney stones    H/O hematuria    Diverticulosis    History of chicken pox    Anxiety and depression    SCC (squamous cell carcinoma)    Bowen's disease     Percival Spanish, PT 05/02/2021, 1:35 PM  [facility] [address]  [place], [place], [address] Phone: [phone]   Fax:  [phone]  Name: Sophia [name] MRN: [id number] Date of Birth: [birth year]

## 2021-05-02 NOTE — Patient Instructions (Addendum)
Access Code: [facility] URL: [email or web address] Date: 05/02/2021 Prepared by: Annie Paras  Exercises Hooklying Hamstring Stretch with Strap - 2-3 x daily - 7 x weekly - 3 reps - 30 sec hold Supine Piriformis Stretch with Foot on Ground - 2-3 x daily - 7 x weekly - 3 reps - 30 sec hold Supine Figure 4 Piriformis Stretch - 2-3 x daily - 7 x weekly - 3 reps - 30 sec hold Modified Thomas Stretch - 2-3 x daily - 7 x weekly - 3 reps - 30 sec hold Supine Hip Adductor Stretch - 2-3 x daily - 7 x weekly - 3 reps - 30 sec hold Standing ITB Stretch - 2-3 x daily - 7 x weekly - 3 reps - 30 sec hold Hooklying Isometric Clamshell - 1 x daily - 7 x weekly - 2 sets - 10 reps - 3 sec hold Bridge with Resistance - 1 x daily - 7 x weekly - 2 sets - 10 reps - 5 sec hold Clamshell with Resistance - 1 x daily - 7 x weekly - 2 sets - 10 reps - 3-5 sec hold Supine Hip Adduction Isometric with Ball - 1 x daily - 7 x weekly - 2 sets - 10 reps - 5 sec hold Supine Bridge with Mini Swiss Ball Between Knees - 1 x daily - 7 x weekly - 2 sets - 10 reps - 5 sec hold  Patient Education Ionto Pt Instructions - [facility]     IONTOPHORESIS PATIENT PRECAUTIONS & CONTRAINDICATIONS:  Redness under one or both electrodes can occur.  This characterized by a uniform redness that usually disappears within 12 hours of treatment. Small pinhead size blisters may result in response to the drug.  Contact your physician if the problem persists more than 24 hours. On rare occasions, iontophoresis therapy can result in temporary skin reactions such as rash, inflammation, irritation or burns.  The skin reactions may be the result of individual sensitivity to the ionic solution used, the condition of the skin at the start of treatment, reaction to the materials in the electrodes, allergies or sensitivity to dexamethasone, or a poor connection between the patch and your skin.  Discontinue using iontophoresis if you have  any of these reactions and report to your therapist. Remove the Patch or electrodes if you have any undue sensation of pain or burning during the treatment and report discomfort to your therapist. Tell your Therapist if you have had known adverse reactions to the application of electrical current. Approximate treatment time is 4-6 hours.  Remove the patch after 6 hours. The Patch can be worn during normal activity, however excessive motion where the electrodes have been placed can cause poor contact between the skin and the electrode or uneven electrical current resulting in greater risk of skin irritation. Keep out of the reach of children.   DO NOT use if you have a cardiac pacemaker or any other electrically sensitive implanted device. DO NOT use if you have a known sensitivity to dexamethasone. DO NOT use during Magnetic Resonance Imaging (MRI). DO NOT use over broken or compromised skin (e.g. sunburn, cuts, or acne) due to the increased risk of skin reaction. DO NOT SHAVE over the area to be treated:  To establish good contact between the Patch and the skin, excessive hair may be clipped. DO NOT place the Patch or electrodes on or over your eyes, directly over your heart, or brain. DO NOT reuse the Patch or electrodes as  this may cause burns to occur.   For questions, please contact your therapist at:  [facility] [address]  [place], [place], [address] Phone: [phone]   Fax:  [phone]    Iontophoresis in Physical Therapy Using Electrical Stimulation to Deliver Medication  By Berlinda Last, PT  Updated on April 30, 2021  Medically reviewed by Damien Fusi, PT, DPT  Iontophoresis is a type of electrical stimulation used in physical therapy. A negatively- or positively-charged current is applied to skin treated with a medicated solution with the same polarity. The two repel each other, pushing the  medication deep into tissues. Iontophoresis can help manage scar tissue, decrease swelling and inflammation, and treat conditions like bursitis and tendonitis.  How Iontophoresis Works To understand the basic principles of iontophoresis, you should draw on some basic lessons from physics and chemistry class. In general, ionic charges that are alike will repel one another, while ions that are oppositely charged will be attracted to one another.  So if you have a medicine in a solution that is negatively charged and you apply a negative electrical charge to it, the medicine in solution will be pushed away, or repelled, from the negative electricity. When using iontophoresis, your physical therapist is using electricity to push medicine into your injured tissues.  The medication used in iontophoresis is ionically charged. So if your physical therapist decides to introduce medication into your injured tissues via iontophoresis and that medication is negatively charged, he or she will use a negative current to drive that medication into your body.  Common Uses There are many different uses for iontophoresis. These include, but are not limited to: Decrease inflammation Decrease pain Decrease muscle spasm Decrease swelling and edema Reduce calcium deposits in the body Manage scar tissue Your PT will work with you to decide on the treatment goals and the rationale for using iontophoresis.  Procedure Before applying iontophoresis, your PT must first decide on which type of medication to use. The medication used in iontophoresis depends on the goals of the treatment. Different medications have different effects on the body, and your PT will decide on the best medication for your specific condition.  Iontophoresis can be used in physical therapy for the local delivery of anesthetics (such as lidocaine), cortisteroids, anti-inflammatory drugs. and analgesics to inflamed joints, muscles, and subcutaneous  tissues.  Many states require that your PT obtain a prescription from your healthcare provider before administering the medication into your body via iontophoresis. Don't be surprised if your therapist contacts your healthcare provider or asks you to contact your healthcare provider prior to administering iontophoresis medication.  A direct current electrical stimulation unit is used to apply iontophoresis. The unit has two electrodes; one electrode is for the negative current, and one is for the positive current. Your PT will apply medication to either the positive electrode or the negative one, depending on the type of medication that is being used for iontophoresis. The electrodes are then applied to your body. The electrode with the medication is applied to the area of your body that is being treated. The electrode without the medication is applied to your body nearby. The electrical stimulation unit is then turned on, and the electricity pushes the medication into your injured body part while you relax.  What to Expect When your physical therapist applies iontophoresis to your body, he or she uses an electrical stimulation device. When the electrical current is turned on,  you will likely feel a slight tingling sensation. Sometimes the stimulation feels like a tiny bee sting. If you are uncomfortable during the iontophoresis treatment, notify your physical therapist and adjustments can be made.  A typical iontophoresis treatment takes 10 to 20 minutes, depending on the amount of medication that your PT is administering to you. When your iontophoresis treatment is completed, your PT will remove the electrodes and inspect your skin. Don't be surprised if your skin is red where the medication electrode was placed; this is common after iontophoresis.  Once you receive your iontophoresis treatment, your PT will give you specific instructions. Many times, withholding ice or heat treatments after  iontophoresis is recommended since these treatments alter circulation to the injured area. This altered circulation might "wash away" the medication that was just introduced to your body. If you have any questions about what to do after iontophoresis, be sure to ask your physical therapist.  Side Effects Iontophoresis is a safe procedure, and side effects are minimal. While receiving the stimulation, you may feel a slight pin prick tingling sensation. Redness may also occur underneath the electrodes used for it. Some patients notice some dryness or rough skin in the area where the iontophoresis was administered. This can be mitigated by using skin lotion over the area several hours after receiving the treatment.??  In the literature review of 25 iontophoresis studies, including 13 randomized trials, rates of adverse skin reactions varied widely but were mostly mild and did not require treatment.  Keep in mind that iontophoresis is a passive treatment, and the most successful physical therapy programs require you to be actively involved in your care. Active exercises are often the most important component of your rehabilitation, so be sure that your PT gives you a strategy to manage your condition when you are not in the physical therapy clinic.  Contraindications While generally considered safe, the procedure is not without its limitations and safety issues. When used for systemic drug delivery, iontophoresis machines are classified by the U.S. Food and Drug Administration (FDA) as a class 3 device alongside total artificial disc replacements and implanted neurostimulators.  Due to the lack of research into its affect on fetal health, iontophoresis is contraindicated in pregnancy. It is also contraindicated if you have a pacemaker, metal implant, cardiac arrhythmia, a skin rash, or skin disease.  Evidence If your physical therapist considers using iontophoresis for your treatment, you should know if  it is likely to be of benefit for your condition. Studies investigating iontophoresis have been performed, some of which are more promising than others.  A 2015 study published in the journal Physiotherapy examined the role of lidocaine iontophoresis in the treatment of spasticity in children with cerebral palsy. Thirty children were randomized to one of two groups: those who received PT and iontophoresis, and those who only received PT. The group that received iontophoresis showed greater improvements in certain walking variables compared to the PT-only group.??  Another study examined the effect of iontophoresis for shoulder impingement syndrome. Eighty-eight subjects with shoulder impingement were randomized into one of three groups: one with placebo ultrasonophoresis and placebo iontophoresis; another with placebo ultrasonophoresis and real iontophoresis; and a third with a real ultrasonophoresis and placebo iontophoresis. The group that received only iontophoresis (without ultrasonophoresis) showed no significant improvements when added to the standard treatment.??  In terms of systemic drug delivery, a 2013 study published in the [place] of Clinical Pharmacology concluded that iontopheresis is "theoretically promising" and, depending on the  aims of treatment, may offer advantages over a transdermal patch. So, iontophoresis may be helpful for some conditions and not in others. But the most important study participant is you. If your PT suggests iontophoresis for your condition, it may be worth a try, but it should not be considered a panacea by any means.  A Word From [facility] Iontophoresis, a form of electrical stimulation, can be an important part of your physical therapy treatment. It is used to introduce medication into your body to achieve specific therapeutic goals. Iontophoresis may be one treatment that can help you return to normal activity quickly and safely after injury.

## 2021-05-05 ENCOUNTER — Other Ambulatory Visit: Payer: Self-pay | Admitting: Cardiovascular Disease

## 2021-05-07 ENCOUNTER — Other Ambulatory Visit: Payer: Self-pay

## 2021-05-07 ENCOUNTER — Encounter: Payer: Self-pay | Admitting: Physical Therapy

## 2021-05-07 ENCOUNTER — Ambulatory Visit: Payer: Medicare HMO | Admitting: Physical Therapy

## 2021-05-07 DIAGNOSIS — R262 Difficulty in walking, not elsewhere classified: Secondary | ICD-10-CM | POA: Diagnosis not present

## 2021-05-07 DIAGNOSIS — M6281 Muscle weakness (generalized): Secondary | ICD-10-CM | POA: Diagnosis not present

## 2021-05-07 DIAGNOSIS — M25651 Stiffness of right hip, not elsewhere classified: Secondary | ICD-10-CM

## 2021-05-07 DIAGNOSIS — R2689 Other abnormalities of gait and mobility: Secondary | ICD-10-CM

## 2021-05-07 DIAGNOSIS — M25551 Pain in right hip: Secondary | ICD-10-CM

## 2021-05-07 NOTE — Therapy (Signed)
[facility] [address]  [place], [place], [address] Phone: [phone]   Fax:  [phone]  Physical Therapy Treatment  Patient Details  Name: Sophia [name] MRN: [id number] Date of Birth: 01-10-1941 Referring Provider (PT): Vickki Hearing, MD   Encounter Date: 05/07/2021   PT End of Session - 05/07/21 0929     Visit Number 4    Number of Visits 13    Date for PT Re-Evaluation 06/06/21    Authorization Type [employer]    Authorization Time Period 04/29/21 - 06/06/21    Authorization - Visit Number 3    Authorization - Number of Visits 12    Progress Note Due on Visit 10    PT Start Time 0929    PT Stop Time 1017    PT Time Calculation (min) 48 min    Activity Tolerance Patient tolerated treatment well    Behavior During Therapy [facility] for tasks assessed/performed             Past Medical History:  Diagnosis Date   Anxiety and depression    Arthritis    hands, knees, etc   Bladder prolapse, female, acquired 09/16/2014   Bowen's disease    Diverticulitis    Diverticulosis    GERD (gastroesophageal reflux disease)    H/O hematuria    for several years, work only revealed kidney stone on right side   History of chicken pox    History of kidney stones    Hyperlipidemia    Hypertension    Leg cramps, sleep related 04/02/2016   Myalgia 04/25/2017   Osteopenia 06/26/2014   Osteoporosis 06/26/2014   Plantar wart of left foot 10/15/2016   Right shoulder pain 04/23/2017   SCC (squamous cell carcinoma)    arms   Statin intolerance 09/11/2019    Past Surgical History:  Procedure Laterality Date   CHOLECYSTECTOMY  2005   LOOP RECORDER INSERTION N/A 04/15/2018   Procedure: LOOP RECORDER INSERTION;  Surgeon: Sanda Klein, MD;  Location: [facility];  Service: Cardiovascular;  Laterality: N/A;   SKIN BIOPSY     multiple    There were no vitals filed for this visit.   Subjective Assessment  - 05/07/21 0932     Subjective Pt reports no localized pain today - just some soreness and stiffness from exercising.    Diagnostic tests 03/18/21 - R hip x-ray: 1. No acute osseous abnormality.  2. Heterotopic ossification about the right greater trochanter, in  keeping with sequelae of prior trauma but increased compared to  prior examination.    Patient Stated Goals "to get my muscles stronger and avoid surgery"    Currently in Pain? No/denies                               [facility] Adult PT Treatment/Exercise - 05/07/21 0929       Exercises   Exercises Knee/Hip      Knee/Hip Exercises: Stretches   ITB Stretch Right;Left;1 rep;30 seconds    ITB Stretch Limitations standing lateral lean with counter support - cues for increased side bending at hip    Piriformis Stretch Right;Left;2 reps;30 seconds    Piriformis Stretch Limitations hooklying KTOS & figure-4 + gentle overpressure    Other Knee/Hip Stretches Supine B hip adductor butterfly stretch 2 x 30 sec      Knee/Hip Exercises: Aerobic   Recumbent  Bike L1 x 6 min      Knee/Hip Exercises: Supine   Bridges Both;10 reps;2 sets;Strengthening    Bridges Limitations + red TB hip ABD isometric    Other Supine Knee/Hip Exercises R/L red TB hip ABD/ER alt unilateral clam 10 x 3", 2 sets      Knee/Hip Exercises: Sidelying   Clams R/L red TB clam 10 x 3"      Manual Therapy   Manual Therapy Soft tissue mobilization;Myofascial release;Other (comment)    Soft tissue mobilization STM/DTM to R hip flexors and adductors, glutes and piriformis    Myofascial Release manual TPR to R distal hip flexors, proximal adductors, lateral glutes and piriformis    Other Manual Therapy Provided instruction in use of tennis ball for glute & piriformis self-STM as well as rolling pin for hip adductor & flexor self-STM                       PT Short Term Goals - 04/30/21 1026       PT SHORT TERM GOAL #1   Title Patient  will be independent with initial HEP    Status On-going    Target Date 05/16/21               PT Long Term Goals - 05/02/21 1311       PT LONG TERM GOAL #1   Title Patient will be independent with ongoing/advanced HEP for self-management at home in order to build upon functional gains in therapy    Status On-going    Target Date 06/06/21      PT LONG TERM GOAL #2   Title Patient to report reduction in frequency and intensity of R hip pain by >/= 50-75% to allow for improved activity tolerance    Status On-going    Target Date 06/06/21      PT LONG TERM GOAL #3   Title Patient will demonstrate improved B hip strength to >/= 4+/5 for improved stability and ease of mobility    Status On-going    Target Date 06/06/21      PT LONG TERM GOAL #4   Title Patient will report ability to complete walking for exercise at her prior level w/o limitation due to R hip pain or weakness    Status On-going    Target Date 06/06/21      PT LONG TERM GOAL #5   Title Patient to report ability to climb stairs without increased R hip pain    Status On-going    Target Date 06/06/21                   Plan - 05/07/21 0935     Clinical Impression Statement Sophia [name] reports new version of ITB stretch better tolerated w/o increased however cues necessary on review to ensure side bending at hip to achieve desired stretch. Pt noting restricted motion with some of stretches and strengthening exercises related to tightness in anterior, medial and posterior hip with increased muscle tension identified in distal R hip flexors, proximal hip adductors and lateral glutes/piriformis. Manual STM/DTM and TPR addressed areas identified with pt noting improving ROM and stretching tolerance with less pain following MT. Instructions provided in options for self-STM at home using a tennis ball or rolling pin. Pt also noting better tolerance for band resisted HEP exercises following MT with PT addressing pt's  questions regarding exercise performance.    Comorbidities HTN, a-fib, NSVT, near syncope, GERD,  diverticulosis, OA, osteoporosis, chronic LBP w/o sciatica, chronic hip pain, Raynaud phenomenon, sleep related leg cramps    Rehab Potential Good    PT Frequency 2x / week    PT Duration 6 weeks    PT Treatment/Interventions ADLs/Self Care Home Management;Cryotherapy;Electrical Stimulation;Iontophoresis 4mg /ml Dexamethasone;Moist Heat;Ultrasound;DME Instruction;Gait training;Stair training;Functional mobility training;Therapeutic activities;Therapeutic exercise;Balance training;Neuromuscular re-education;Patient/family education;Manual techniques;Passive range of motion;Dry needling;Taping;Joint Manipulations    PT Next Visit Plan progress hip/lumbopelvic flexibility and strengthening; manual therapy and modalities PRN including possible ionto patch    PT Home Exercise Plan Access Code: [email or web address] (10/28, updated 11/4)    Consulted and Agree with Plan of Care Patient             Patient will benefit from skilled therapeutic intervention in order to improve the following deficits and impairments:  Decreased activity tolerance, Decreased mobility, Decreased range of motion, Decreased strength, Difficulty walking, Increased fascial restricitons, Increased muscle spasms, Impaired perceived functional ability, Impaired flexibility, Improper body mechanics, Postural dysfunction, Pain  Visit Diagnosis: Pain in right hip  Stiffness of right hip, not elsewhere classified  Muscle weakness (generalized)  Other abnormalities of gait and mobility  Difficulty in walking, not elsewhere classified     Problem List Patient Active Problem List   Diagnosis Date Noted   Elevated coronary artery calcium score 04/09/2021   Nonintractable headache 01/14/2021   Allergic drug reaction 03/26/2020   Vitamin D deficiency 12/28/2019   Complex renal cyst 12/28/2019   Elevated liver function tests 12/22/2019    Bug bite 12/22/2019   Persistent atrial fibrillation ([place]) 11/13/2019   Secondary hypercoagulable state ([place]) 11/13/2019   Encounter for loop recorder check 10/20/2019   Long term (current) use of anticoagulants 10/20/2019   Chronic bilateral low back pain without sciatica 09/24/2019   Statin intolerance 09/11/2019   Diarrhea 08/08/2018   Raynaud phenomenon 08/08/2018   Pulsatile tinnitus 04/26/2018   Hyperglycemia 04/26/2018   Cervicalgia 04/26/2018   Near syncope 04/15/2018   NSVT (nonsustained ventricular tachycardia) 04/14/2018   Palpitations 02/25/2018   Bilateral carotid bruits 02/25/2018   Myalgia 04/25/2017   Left shoulder pain 04/23/2017   Plantar wart of left foot 10/15/2016   Leg cramps, sleep related 04/02/2016   Hip pain 05/29/2015   Preventative health care 03/24/2015   Bladder prolapse, female, acquired 09/16/2014   Osteoporosis 06/26/2014   Arthritis    GERD (gastroesophageal reflux disease)    Hypertension    Hyperlipidemia    History of kidney stones    H/O hematuria    Diverticulosis    History of chicken pox    Anxiety and depression    SCC (squamous cell carcinoma)    Bowen's disease     Percival Spanish, PT 05/07/2021, 10:48 AM  [facility] [address]  [place], [place], [address] Phone: [phone]   Fax:  [phone]  Name: Sophia [name] MRN: [id number] Date of Birth: 1940/08/01

## 2021-05-09 ENCOUNTER — Ambulatory Visit: Payer: Medicare HMO | Admitting: Physical Therapy

## 2021-05-11 ENCOUNTER — Other Ambulatory Visit: Payer: Self-pay | Admitting: Internal Medicine

## 2021-05-11 DIAGNOSIS — I480 Paroxysmal atrial fibrillation: Secondary | ICD-10-CM

## 2021-05-12 NOTE — Telephone Encounter (Signed)
Patient denied [employer] assistance   She previously had a [employer] grant Re-enrolled her for this application - approved  Patient aware of approval. MyChart message sent with info as noted below  CARD NO. [id number]  BIN [id number]   PCN [id number]   PC GROUP [id number]   PROVIDER PDMI   PROCESSOR PDMI

## 2021-05-13 ENCOUNTER — Ambulatory Visit: Payer: Medicare HMO

## 2021-05-13 ENCOUNTER — Other Ambulatory Visit: Payer: Self-pay

## 2021-05-13 DIAGNOSIS — R262 Difficulty in walking, not elsewhere classified: Secondary | ICD-10-CM | POA: Diagnosis not present

## 2021-05-13 DIAGNOSIS — R2689 Other abnormalities of gait and mobility: Secondary | ICD-10-CM | POA: Diagnosis not present

## 2021-05-13 DIAGNOSIS — M25551 Pain in right hip: Secondary | ICD-10-CM | POA: Diagnosis not present

## 2021-05-13 DIAGNOSIS — M25651 Stiffness of right hip, not elsewhere classified: Secondary | ICD-10-CM | POA: Diagnosis not present

## 2021-05-13 DIAGNOSIS — M6281 Muscle weakness (generalized): Secondary | ICD-10-CM

## 2021-05-13 NOTE — Therapy (Signed)
[facility] [address]  [place], [place], [address] Phone: [phone]   Fax:  [phone]  Physical Therapy Treatment  Patient Details  Name: Sophia [name] MRN: [id number] Date of Birth: 1941-04-28 Referring Provider (PT): Vickki Hearing, MD   Encounter Date: 05/13/2021   PT End of Session - 05/13/21 1019     Visit Number 5    Number of Visits 13    Date for PT Re-Evaluation 06/06/21    Authorization Type [employer]    Authorization Time Period 04/29/21 - 06/06/21    Authorization - Visit Number 4    Authorization - Number of Visits 12    Progress Note Due on Visit 10    PT Start Time 0933    PT Stop Time 1016    PT Time Calculation (min) 43 min    Activity Tolerance Patient tolerated treatment well;Patient limited by pain    Behavior During Therapy [facility] for tasks assessed/performed             Past Medical History:  Diagnosis Date   Anxiety and depression    Arthritis    hands, knees, etc   Bladder prolapse, female, acquired 09/16/2014   Bowen's disease    Diverticulitis    Diverticulosis    GERD (gastroesophageal reflux disease)    H/O hematuria    for several years, work only revealed kidney stone on right side   History of chicken pox    History of kidney stones    Hyperlipidemia    Hypertension    Leg cramps, sleep related 04/02/2016   Myalgia 04/25/2017   Osteopenia 06/26/2014   Osteoporosis 06/26/2014   Plantar wart of left foot 10/15/2016   Right shoulder pain 04/23/2017   SCC (squamous cell carcinoma)    arms   Statin intolerance 09/11/2019    Past Surgical History:  Procedure Laterality Date   CHOLECYSTECTOMY  2005   LOOP RECORDER INSERTION N/A 04/15/2018   Procedure: LOOP RECORDER INSERTION;  Surgeon: Sanda Klein, MD;  Location: [facility];  Service: Cardiovascular;  Laterality: N/A;   SKIN BIOPSY     multiple    There were no vitals filed for this  visit.   Subjective Assessment - 05/13/21 0936     Subjective Pt reports some soreness from doing her home exercises.    Diagnostic tests 03/18/21 - R hip x-ray: 1. No acute osseous abnormality.  2. Heterotopic ossification about the right greater trochanter, in  keeping with sequelae of prior trauma but increased compared to  prior examination.    Patient Stated Goals "to get my muscles stronger and avoid surgery"    Currently in Pain? No/denies                               [facility] Adult PT Treatment/Exercise - 05/13/21 0001       Exercises   Exercises Lumbar;Knee/Hip      Lumbar Exercises: Sidelying   Other Sidelying Lumbar Exercises R/L open books 10x3"      Knee/Hip Exercises: Stretches   Piriformis Stretch Right;Left;2 reps;30 seconds    Piriformis Stretch Limitations seated KTOS; seated figure 4 was too painful      Knee/Hip Exercises: Aerobic   Nustep L4x60min      Knee/Hip Exercises: Sidelying   Clams R/L red TB clam 20 x 3"      Manual Therapy  Manual Therapy Soft tissue mobilization    Soft tissue mobilization IASTM with foam roll to R lateral hip, ITB, quads, hip flexors                       PT Short Term Goals - 04/30/21 1026       PT SHORT TERM GOAL #1   Title Patient will be independent with initial HEP    Status On-going    Target Date 05/16/21               PT Long Term Goals - 05/02/21 1311       PT LONG TERM GOAL #1   Title Patient will be independent with ongoing/advanced HEP for self-management at home in order to build upon functional gains in therapy    Status On-going    Target Date 06/06/21      PT LONG TERM GOAL #2   Title Patient to report reduction in frequency and intensity of R hip pain by >/= 50-75% to allow for improved activity tolerance    Status On-going    Target Date 06/06/21      PT LONG TERM GOAL #3   Title Patient will demonstrate improved B hip strength to >/= 4+/5 for improved  stability and ease of mobility    Status On-going    Target Date 06/06/21      PT LONG TERM GOAL #4   Title Patient will report ability to complete walking for exercise at her prior level w/o limitation due to R hip pain or weakness    Status On-going    Target Date 06/06/21      PT LONG TERM GOAL #5   Title Patient to report ability to climb stairs without increased R hip pain    Status On-going    Target Date 06/06/21                   Plan - 05/13/21 1026     Clinical Impression Statement Pt had some pain with trying a seated figure 4 stretch on the R side, but well tolerated the KTOS in sitting. She needed some modification with the open books going to the L side to do with hand over head due to L shoulder problems. She was still very tight on the lateral portion of her hip and knee, we spoke briely about DN, I educated her on the process of DN and benefits with a handout provided. Good response from the manual work done today.    Personal Factors and Comorbidities Time since onset of injury/illness/exacerbation;Past/Current Experience;Comorbidity 3+;Age;Social Background    Comorbidities HTN, a-fib, NSVT, near syncope, GERD, diverticulosis, OA, osteoporosis, chronic LBP w/o sciatica, chronic hip pain, Raynaud phenomenon, sleep related leg cramps    PT Frequency 2x / week    PT Duration 6 weeks    PT Treatment/Interventions ADLs/Self Care Home Management;Cryotherapy;Electrical Stimulation;Iontophoresis 4mg /ml Dexamethasone;Moist Heat;Ultrasound;DME Instruction;Gait training;Stair training;Functional mobility training;Therapeutic activities;Therapeutic exercise;Balance training;Neuromuscular re-education;Patient/family education;Manual techniques;Passive range of motion;Dry needling;Taping;Joint Manipulations    PT Next Visit Plan progress hip/lumbopelvic flexibility and strengthening; manual therapy and modalities PRN including possible ionto patch    PT Home Exercise Plan  Access Code: [email or web address] (10/28, updated 11/4)    Consulted and Agree with Plan of Care Patient             Patient will benefit from skilled therapeutic intervention in order to improve the following deficits and impairments:  Decreased activity tolerance, Decreased mobility,  Decreased range of motion, Decreased strength, Difficulty walking, Increased fascial restricitons, Increased muscle spasms, Impaired perceived functional ability, Impaired flexibility, Improper body mechanics, Postural dysfunction, Pain  Visit Diagnosis: Pain in right hip  Stiffness of right hip, not elsewhere classified  Muscle weakness (generalized)  Other abnormalities of gait and mobility  Difficulty in walking, not elsewhere classified     Problem List Patient Active Problem List   Diagnosis Date Noted   Elevated coronary artery calcium score 04/09/2021   Nonintractable headache 01/14/2021   Allergic drug reaction 03/26/2020   Vitamin D deficiency 12/28/2019   Complex renal cyst 12/28/2019   Elevated liver function tests 12/22/2019   Bug bite 12/22/2019   Persistent atrial fibrillation ([place]) 11/13/2019   Secondary hypercoagulable state ([place]) 11/13/2019   Encounter for loop recorder check 10/20/2019   Long term (current) use of anticoagulants 10/20/2019   Chronic bilateral low back pain without sciatica 09/24/2019   Statin intolerance 09/11/2019   Diarrhea 08/08/2018   Raynaud phenomenon 08/08/2018   Pulsatile tinnitus 04/26/2018   Hyperglycemia 04/26/2018   Cervicalgia 04/26/2018   Near syncope 04/15/2018   NSVT (nonsustained ventricular tachycardia) 04/14/2018   Palpitations 02/25/2018   Bilateral carotid bruits 02/25/2018   Myalgia 04/25/2017   Left shoulder pain 04/23/2017   Plantar wart of left foot 10/15/2016   Leg cramps, sleep related 04/02/2016   Hip pain 05/29/2015   Preventative health care 03/24/2015   Bladder prolapse, female, acquired 09/16/2014   Osteoporosis 06/26/2014    Arthritis    GERD (gastroesophageal reflux disease)    Hypertension    Hyperlipidemia    History of kidney stones    H/O hematuria    Diverticulosis    History of chicken pox    Anxiety and depression    SCC (squamous cell carcinoma)    Bowen's disease     Artist Pais, PTA 05/13/2021, 11:23 AM  [facility] [address]  [place], [place], [address] Phone: [phone]   Fax:  [phone]  Name: ONNA NODAL MRN: [id number] Date of Birth: 03/15/1941

## 2021-05-14 ENCOUNTER — Encounter: Payer: Medicare HMO | Admitting: Physical Therapy

## 2021-05-16 ENCOUNTER — Other Ambulatory Visit: Payer: Self-pay

## 2021-05-16 ENCOUNTER — Encounter: Payer: Self-pay | Admitting: Physical Therapy

## 2021-05-16 ENCOUNTER — Ambulatory Visit: Payer: Medicare HMO | Admitting: Physical Therapy

## 2021-05-16 DIAGNOSIS — M6281 Muscle weakness (generalized): Secondary | ICD-10-CM | POA: Diagnosis not present

## 2021-05-16 DIAGNOSIS — M25651 Stiffness of right hip, not elsewhere classified: Secondary | ICD-10-CM | POA: Diagnosis not present

## 2021-05-16 DIAGNOSIS — M25551 Pain in right hip: Secondary | ICD-10-CM | POA: Diagnosis not present

## 2021-05-16 DIAGNOSIS — R262 Difficulty in walking, not elsewhere classified: Secondary | ICD-10-CM

## 2021-05-16 DIAGNOSIS — R2689 Other abnormalities of gait and mobility: Secondary | ICD-10-CM | POA: Diagnosis not present

## 2021-05-16 NOTE — Therapy (Signed)
[facility] [address]  [place], [place], [address] Phone: [phone]   Fax:  [phone]  Physical Therapy Treatment  Patient Details  Name: Sophia [name] MRN: [id number] Date of Birth: 13-Jul-1940 Referring Provider (PT): Vickki Hearing, MD   Encounter Date: 05/16/2021   PT End of Session - 05/16/21 0845     Visit Number 6    Number of Visits 13    Date for PT Re-Evaluation 06/06/21    Authorization Type [employer]    Authorization Time Period 04/29/21 - 06/06/21    Authorization - Visit Number 5    Authorization - Number of Visits 12    Progress Note Due on Visit 10    PT Start Time 0845    PT Stop Time 0928    PT Time Calculation (min) 43 min    Activity Tolerance Patient tolerated treatment well    Behavior During Therapy [facility] for tasks assessed/performed             Past Medical History:  Diagnosis Date   Anxiety and depression    Arthritis    hands, knees, etc   Bladder prolapse, female, acquired 09/16/2014   Bowen's disease    Diverticulitis    Diverticulosis    GERD (gastroesophageal reflux disease)    H/O hematuria    for several years, work only revealed kidney stone on right side   History of chicken pox    History of kidney stones    Hyperlipidemia    Hypertension    Leg cramps, sleep related 04/02/2016   Myalgia 04/25/2017   Osteopenia 06/26/2014   Osteoporosis 06/26/2014   Plantar wart of left foot 10/15/2016   Right shoulder pain 04/23/2017   SCC (squamous cell carcinoma)    arms   Statin intolerance 09/11/2019    Past Surgical History:  Procedure Laterality Date   CHOLECYSTECTOMY  2005   LOOP RECORDER INSERTION N/A 04/15/2018   Procedure: LOOP RECORDER INSERTION;  Surgeon: Sanda Klein, MD;  Location: [facility];  Service: Cardiovascular;  Laterality: N/A;   SKIN BIOPSY     multiple    There were no vitals filed for this visit.   Subjective  Assessment - 05/16/21 0849     Subjective Pt reports she is doing better - able to walk a whole mile w/o any pain now and feels like she is getting further with the stretches.    How long can you walk comfortably? 1 mile    Diagnostic tests 03/18/21 - R hip x-ray: 1. No acute osseous abnormality.  2. Heterotopic ossification about the right greater trochanter, in  keeping with sequelae of prior trauma but increased compared to  prior examination.    Patient Stated Goals "to get my muscles stronger and avoid surgery"    Currently in Pain? No/denies                               [facility] Adult PT Treatment/Exercise - 05/16/21 0845       Knee/Hip Exercises: Aerobic   Recumbent Bike L2 x 6 min      Knee/Hip Exercises: Standing   Hip Abduction Right;Left;10 reps;Knee straight;Stengthening    Abduction Limitations pain with straight plane abduction but better tolerated with 45 into extension   UE support on back of chair   Hip Extension Right;Left;10 reps;Stengthening;Knee straight    Extension Limitations cues  to avoid excessive trunk flexion    Lateral Step Up Right;Left;10 reps;Step Height: 6";Hand Hold: 1    Functional Squat 10 reps;3 seconds    Functional Squat Limitations + red TB hip ABD isometric to discourage genu valgum, cues to avoid knees foward of toes    SLS with Vectors --   R/L SLS + opp LE fwd/back arc maintaining level pelvis x 10   Other Standing Knee Exercises B side stepping & fwd monster walk with looped red TB at knees 2 x 20 ft each    Other Standing Knee Exercises R/L crossover lateral step-up x10                     PT Education - 05/16/21 0928     Education Details HEP update - standing strengthening exercises - Access Code: [facility]    Person(s) Educated Patient    Methods Explanation;Demonstration;Verbal cues;Handout    Comprehension Verbalized understanding;Verbal cues required;Returned demonstration;Need further instruction               PT Short Term Goals - 05/16/21 0852       PT SHORT TERM GOAL #1   Title Patient will be independent with initial HEP    Status Achieved   05/16/21              PT Long Term Goals - 05/02/21 1311       PT LONG TERM GOAL #1   Title Patient will be independent with ongoing/advanced HEP for self-management at home in order to build upon functional gains in therapy    Status On-going    Target Date 06/06/21      PT LONG TERM GOAL #2   Title Patient to report reduction in frequency and intensity of R hip pain by >/= 50-75% to allow for improved activity tolerance    Status On-going    Target Date 06/06/21      PT LONG TERM GOAL #3   Title Patient will demonstrate improved B hip strength to >/= 4+/5 for improved stability and ease of mobility    Status On-going    Target Date 06/06/21      PT LONG TERM GOAL #4   Title Patient will report ability to complete walking for exercise at her prior level w/o limitation due to R hip pain or weakness    Status On-going    Target Date 06/06/21      PT LONG TERM GOAL #5   Title Patient to report ability to climb stairs without increased R hip pain    Status On-going    Target Date 06/06/21                   Plan - 05/16/21 0852     Clinical Impression Statement Lovey Newcomer reports her pain seems to be improving, noting she is now able to walk a mile w/o any pain. She reports good comfort with the HEP and feels like she is getting further with the stretches - she denies need for further review - STG met. Progressed strengthening to more standing focused exercises with good tolerance for all exercises other than some pain noted with straight plane hip abduction on R - a few exercises added to HEP with pt informed that she can alternate days on strengthening exercises as needed to avoid over-fatiguing with exercises.    Comorbidities HTN, a-fib, NSVT, near syncope, GERD, diverticulosis, OA, osteoporosis, chronic LBP w/o  sciatica, chronic hip pain, Raynaud  phenomenon, sleep related leg cramps    Rehab Potential Good    PT Frequency 2x / week    PT Duration 6 weeks    PT Treatment/Interventions ADLs/Self Care Home Management;Cryotherapy;Electrical Stimulation;Iontophoresis 78m/ml Dexamethasone;Moist Heat;Ultrasound;DME Instruction;Gait training;Stair training;Functional mobility training;Therapeutic activities;Therapeutic exercise;Balance training;Neuromuscular re-education;Patient/family education;Manual techniques;Passive range of motion;Dry needling;Taping;Joint Manipulations    PT Next Visit Plan progress hip/lumbopelvic flexibility and strengthening; manual therapy and modalities PRN including possible ionto patch    PT Home Exercise Plan Access Code: [email or web address] (10/28, updated 11/4)    Consulted and Agree with Plan of Care Patient             Patient will benefit from skilled therapeutic intervention in order to improve the following deficits and impairments:  Decreased activity tolerance, Decreased mobility, Decreased range of motion, Decreased strength, Difficulty walking, Increased fascial restricitons, Increased muscle spasms, Impaired perceived functional ability, Impaired flexibility, Improper body mechanics, Postural dysfunction, Pain  Visit Diagnosis: Pain in right hip  Stiffness of right hip, not elsewhere classified  Muscle weakness (generalized)  Other abnormalities of gait and mobility  Difficulty in walking, not elsewhere classified     Problem List Patient Active Problem List   Diagnosis Date Noted   Elevated coronary artery calcium score 04/09/2021   Nonintractable headache 01/14/2021   Allergic drug reaction 03/26/2020   Vitamin D deficiency 12/28/2019   Complex renal cyst 12/28/2019   Elevated liver function tests 12/22/2019   Bug bite 12/22/2019   Persistent atrial fibrillation ([facility] 11/13/2019   Secondary hypercoagulable state ([facility] 11/13/2019   Encounter for loop  recorder check 10/20/2019   Long term (current) use of anticoagulants 10/20/2019   Chronic bilateral low back pain without sciatica 09/24/2019   Statin intolerance 09/11/2019   Diarrhea 08/08/2018   Raynaud phenomenon 08/08/2018   Pulsatile tinnitus 04/26/2018   Hyperglycemia 04/26/2018   Cervicalgia 04/26/2018   Near syncope 04/15/2018   NSVT (nonsustained ventricular tachycardia) 04/14/2018   Palpitations 02/25/2018   Bilateral carotid bruits 02/25/2018   Myalgia 04/25/2017   Left shoulder pain 04/23/2017   Plantar wart of left foot 10/15/2016   Leg cramps, sleep related 04/02/2016   Hip pain 05/29/2015   Preventative health care 03/24/2015   Bladder prolapse, female, acquired 09/16/2014   Osteoporosis 06/26/2014   Arthritis    GERD (gastroesophageal reflux disease)    Hypertension    Hyperlipidemia    History of kidney stones    H/O hematuria    Diverticulosis    History of chicken pox    Anxiety and depression    SCC (squamous cell carcinoma)    Bowen's disease     JPercival Spanish PT 05/16/2021, 9:37 AM  [facility] [place] [address]Phone: [phone]  Fax:  [phone] Name: SAMMI HUTTMRN: [id number] of Birth: [birth year]

## 2021-05-16 NOTE — Patient Instructions (Addendum)
   Access Code: [facility] URL: [email or web address] Date: 05/16/2021 Prepared by: Annie Paras  Exercises Hooklying Hamstring Stretch with Strap - 2-3 x daily - 7 x weekly - 3 reps - 30 sec hold Supine Piriformis Stretch with Foot on Ground - 2-3 x daily - 7 x weekly - 3 reps - 30 sec hold Supine Figure 4 Piriformis Stretch - 2-3 x daily - 7 x weekly - 3 reps - 30 sec hold Modified Thomas Stretch - 2-3 x daily - 7 x weekly - 3 reps - 30 sec hold Supine Hip Adductor Stretch - 2-3 x daily - 7 x weekly - 3 reps - 30 sec hold Standing ITB Stretch - 2-3 x daily - 7 x weekly - 3 reps - 30 sec hold Hooklying Isometric Clamshell - 1 x daily - 7 x weekly - 2 sets - 10 reps - 3 sec hold Bridge with Resistance - 1 x daily - 7 x weekly - 2 sets - 10 reps - 5 sec hold Clamshell with Resistance - 1 x daily - 7 x weekly - 2 sets - 10 reps - 3-5 sec hold Supine Hip Adduction Isometric with Ball - 1 x daily - 7 x weekly - 2 sets - 10 reps - 5 sec hold Supine Bridge with Mini Swiss Ball Between Knees - 1 x daily - 7 x weekly - 2 sets - 10 reps - 5 sec hold Diagonal Hip Extension - 1 x daily - 7 x weekly - 2 sets - 10 reps - 3 sec hold Side Stepping with Resistance at Thighs and Counter Support - 1 x daily - 3-4 x weekly - 2 sets - 10 reps - 3 sec hold Forward Backward Monster Walk with Band at Thighs and Counter Support - 1 x daily - 3-4 x weekly - 2 sets - 10 reps  Patient Education [place] Pt Instructions - [facility]

## 2021-05-19 ENCOUNTER — Other Ambulatory Visit: Payer: Self-pay

## 2021-05-19 ENCOUNTER — Ambulatory Visit: Payer: Medicare HMO | Admitting: Physical Therapy

## 2021-05-19 ENCOUNTER — Encounter: Payer: Self-pay | Admitting: Physical Therapy

## 2021-05-19 DIAGNOSIS — M25651 Stiffness of right hip, not elsewhere classified: Secondary | ICD-10-CM | POA: Diagnosis not present

## 2021-05-19 DIAGNOSIS — R262 Difficulty in walking, not elsewhere classified: Secondary | ICD-10-CM | POA: Diagnosis not present

## 2021-05-19 DIAGNOSIS — M6281 Muscle weakness (generalized): Secondary | ICD-10-CM

## 2021-05-19 DIAGNOSIS — M25551 Pain in right hip: Secondary | ICD-10-CM | POA: Diagnosis not present

## 2021-05-19 DIAGNOSIS — R2689 Other abnormalities of gait and mobility: Secondary | ICD-10-CM | POA: Diagnosis not present

## 2021-05-19 LAB — CUP PACEART REMOTE DEVICE CHECK
Date Time Interrogation Session: 20221117001857
Implantable Pulse Generator Implant Date: 20191018

## 2021-05-19 NOTE — Therapy (Signed)
[facility] [address]  [place], [place], [address] Phone: [phone]   Fax:  [phone]  Physical Therapy Treatment  Patient Details  Name: Sophia [name] MRN: [id number] Date of Birth: 10/02/1940 Referring Provider (PT): Vickki Hearing, MD   Encounter Date: 05/19/2021   PT End of Session - 05/19/21 0847     Visit Number 7    Number of Visits 13    Date for PT Re-Evaluation 06/06/21    Authorization Type [employer]    Authorization Time Period 04/29/21 - 06/06/21    Authorization - Visit Number 6    Authorization - Number of Visits 12    Progress Note Due on Visit 10    PT Start Time 6962    PT Stop Time 0928    PT Time Calculation (min) 41 min    Activity Tolerance Patient tolerated treatment well    Behavior During Therapy [facility] for tasks assessed/performed             Past Medical History:  Diagnosis Date   Anxiety and depression    Arthritis    hands, knees, etc   Bladder prolapse, female, acquired 09/16/2014   Bowen's disease    Diverticulitis    Diverticulosis    GERD (gastroesophageal reflux disease)    H/O hematuria    for several years, work only revealed kidney stone on right side   History of chicken pox    History of kidney stones    Hyperlipidemia    Hypertension    Leg cramps, sleep related 04/02/2016   Myalgia 04/25/2017   Osteopenia 06/26/2014   Osteoporosis 06/26/2014   Plantar wart of left foot 10/15/2016   Right shoulder pain 04/23/2017   SCC (squamous cell carcinoma)    arms   Statin intolerance 09/11/2019    Past Surgical History:  Procedure Laterality Date   CHOLECYSTECTOMY  2005   LOOP RECORDER INSERTION N/A 04/15/2018   Procedure: LOOP RECORDER INSERTION;  Surgeon: Sanda Klein, MD;  Location: [facility];  Service: Cardiovascular;  Laterality: N/A;   SKIN BIOPSY     multiple    There were no vitals filed for this visit.   Subjective  Assessment - 05/19/21 0849     Subjective Pt reports a little muscle soreness after the new exercises last time which is now resolved.    Diagnostic tests 03/18/21 - R hip x-ray: 1. No acute osseous abnormality.  2. Heterotopic ossification about the right greater trochanter, in  keeping with sequelae of prior trauma but increased compared to  prior examination.    Patient Stated Goals "to get my muscles stronger and avoid surgery"    Currently in Pain? No/denies                               [facility] Adult PT Treatment/Exercise - 05/19/21 0847       Knee/Hip Exercises: Aerobic   Nustep L5 x 6 min (UE/LE)      Knee/Hip Exercises: Standing   Hip Abduction Right;Left;2 sets;10 reps;Stengthening;Knee straight    Abduction Limitations looped red TB at knees; 1st set - straight plane abduction; 2nd set - 45 into extension   UE support on back of chair   Hip Extension Right;Left;10 reps;Stengthening;Knee straight    Extension Limitations looped red TB at knees   UE support on back of chair   Functional Squat  10 reps;3 seconds    Functional Squat Limitations + red TB hip ABD isometric to discourage genu valgum, cues to avoid knees foward of toes   UE support on back of chair   SLS SLS + R/L hip ABD isometric into ball on wall 10 x 3"    SLS with Vectors R/L SLS + 3-way opp toe tap to cones x 10      Ankle Exercises: Seated   Toe Raise 10 reps;3 seconds    Toe Raise Limitations R/L with looped red TB resistance      Ankle Exercises: Standing   Heel Raises Both;20 reps;3 seconds    Toe Raise 20 reps;3 seconds                       PT Short Term Goals - 05/16/21 0852       PT SHORT TERM GOAL #1   Title Patient will be independent with initial HEP    Status Achieved   05/16/21              PT Long Term Goals - 05/19/21 0851       PT LONG TERM GOAL #1   Title Patient will be independent with ongoing/advanced HEP for self-management at home in  order to build upon functional gains in therapy    Status Partially Met    Target Date 06/06/21      PT LONG TERM GOAL #2   Title Patient to report reduction in frequency and intensity of R hip pain by >/= 50-75% to allow for improved activity tolerance    Status On-going   05/19/21 - Pt reports 40% improvement in pain thus far   Target Date 06/06/21      PT LONG TERM GOAL #3   Title Patient will demonstrate improved B hip strength to >/= 4+/5 for improved stability and ease of mobility    Status On-going    Target Date 06/06/21      PT LONG TERM GOAL #4   Title Patient will report ability to complete walking for exercise at her prior level w/o limitation due to R hip pain or weakness    Status On-going    Target Date 06/06/21      PT LONG TERM GOAL #5   Title Patient to report ability to climb stairs without increased R hip pain    Status Partially Met   05/19/21 - Pt reports she still feels a pull on the R side when negotiating stairs but no pain   Target Date 06/06/21                   Plan - 05/19/21 0855     Clinical Impression Statement Lovey Newcomer reports some increased muscle soreness in B LE following new exercises last visit but was able to resolve this with repetition of exercises and use of rolling pin for self-STM. She denies any muscle soreness or TTP today and hence no need for STM/MT. She was able to tolerate addition of proximal red TB resistance with standing hip SLRs today w/o increased pain including with straight plan abduction which had been painful last visit. Introduced ankle strengthening along with balance/proprioceptive training to further improve stability with good tolerance.  Overall, Sandy reports 40% improvement in pain and functional mobility/activity tolerance related to her R hip since starting PT.    Comorbidities HTN, a-fib, NSVT, near syncope, GERD, diverticulosis, OA, osteoporosis, chronic LBP w/o sciatica, chronic hip pain,  Raynaud phenomenon,  sleep related leg cramps    Rehab Potential Good    PT Frequency 2x / week    PT Duration 6 weeks    PT Treatment/Interventions ADLs/Self Care Home Management;Cryotherapy;Electrical Stimulation;Iontophoresis 55m/ml Dexamethasone;Moist Heat;Ultrasound;DME Instruction;Gait training;Stair training;Functional mobility training;Therapeutic activities;Therapeutic exercise;Balance training;Neuromuscular re-education;Patient/family education;Manual techniques;Passive range of motion;Dry needling;Taping;Joint Manipulations    PT Next Visit Plan progress hip/lumbopelvic flexibility and strengthening; manual therapy and modalities PRN including possible ionto patch    PT Home Exercise Plan Access Code: [email or web address] (10/28, updated 11/4 & 11/18)    Consulted and Agree with Plan of Care Patient             Patient will benefit from skilled therapeutic intervention in order to improve the following deficits and impairments:  Decreased activity tolerance, Decreased mobility, Decreased range of motion, Decreased strength, Difficulty walking, Increased fascial restricitons, Increased muscle spasms, Impaired perceived functional ability, Impaired flexibility, Improper body mechanics, Postural dysfunction, Pain  Visit Diagnosis: Pain in right hip  Stiffness of right hip, not elsewhere classified  Muscle weakness (generalized)  Other abnormalities of gait and mobility  Difficulty in walking, not elsewhere classified     Problem List Patient Active Problem List   Diagnosis Date Noted   Elevated coronary artery calcium score 04/09/2021   Nonintractable headache 01/14/2021   Allergic drug reaction 03/26/2020   Vitamin D deficiency 12/28/2019   Complex renal cyst 12/28/2019   Elevated liver function tests 12/22/2019   Bug bite 12/22/2019   Persistent atrial fibrillation ([facility] 11/13/2019   Secondary hypercoagulable state ([facility] 11/13/2019   Encounter for loop recorder check 10/20/2019   Long term  (current) use of anticoagulants 10/20/2019   Chronic bilateral low back pain without sciatica 09/24/2019   Statin intolerance 09/11/2019   Diarrhea 08/08/2018   Raynaud phenomenon 08/08/2018   Pulsatile tinnitus 04/26/2018   Hyperglycemia 04/26/2018   Cervicalgia 04/26/2018   Near syncope 04/15/2018   NSVT (nonsustained ventricular tachycardia) 04/14/2018   Palpitations 02/25/2018   Bilateral carotid bruits 02/25/2018   Myalgia 04/25/2017   Left shoulder pain 04/23/2017   Plantar wart of left foot 10/15/2016   Leg cramps, sleep related 04/02/2016   Hip pain 05/29/2015   Preventative health care 03/24/2015   Bladder prolapse, female, acquired 09/16/2014   Osteoporosis 06/26/2014   Arthritis    GERD (gastroesophageal reflux disease)    Hypertension    Hyperlipidemia    History of kidney stones    H/O hematuria    Diverticulosis    History of chicken pox    Anxiety and depression    SCC (squamous cell carcinoma)    Bowen's disease     JPercival Spanish PT 05/19/2021, 9:31 AM  [facility] [place] [address]Phone: [phone]  Fax:  [phone] Name: SALLISSON SCHINDELMRN: [id number] of Birth: 115-Jun-1942

## 2021-05-21 ENCOUNTER — Other Ambulatory Visit: Payer: Self-pay

## 2021-05-21 ENCOUNTER — Ambulatory Visit: Payer: Medicare HMO

## 2021-05-21 DIAGNOSIS — R2689 Other abnormalities of gait and mobility: Secondary | ICD-10-CM | POA: Diagnosis not present

## 2021-05-21 DIAGNOSIS — R262 Difficulty in walking, not elsewhere classified: Secondary | ICD-10-CM

## 2021-05-21 DIAGNOSIS — M6281 Muscle weakness (generalized): Secondary | ICD-10-CM

## 2021-05-21 DIAGNOSIS — M25651 Stiffness of right hip, not elsewhere classified: Secondary | ICD-10-CM

## 2021-05-21 DIAGNOSIS — M25551 Pain in right hip: Secondary | ICD-10-CM

## 2021-05-21 NOTE — Therapy (Signed)
[facility] [address]  [place], [place], [address] Phone: [phone]   Fax:  [phone]  Physical Therapy Treatment  Patient Details  Name: Sophia [name] MRN: [id number] Date of Birth: September 11, 1940 Referring Provider (PT): Vickki Hearing, MD   Encounter Date: 05/21/2021   PT End of Session - 05/21/21 0931     Visit Number 8    Number of Visits 13    Date for PT Re-Evaluation 06/06/21    Authorization Type [employer]    Authorization Time Period 04/29/21 - 06/06/21    Authorization - Visit Number 7    Authorization - Number of Visits 12    Progress Note Due on Visit 10    PT Start Time 9030    PT Stop Time 0927    PT Time Calculation (min) 40 min    Activity Tolerance Patient tolerated treatment well    Behavior During Therapy [facility] for tasks assessed/performed             Past Medical History:  Diagnosis Date   Anxiety and depression    Arthritis    hands, knees, etc   Bladder prolapse, female, acquired 09/16/2014   Bowen's disease    Diverticulitis    Diverticulosis    GERD (gastroesophageal reflux disease)    H/O hematuria    for several years, work only revealed kidney stone on right side   History of chicken pox    History of kidney stones    Hyperlipidemia    Hypertension    Leg cramps, sleep related 04/02/2016   Myalgia 04/25/2017   Osteopenia 06/26/2014   Osteoporosis 06/26/2014   Plantar wart of left foot 10/15/2016   Right shoulder pain 04/23/2017   SCC (squamous cell carcinoma)    arms   Statin intolerance 09/11/2019    Past Surgical History:  Procedure Laterality Date   CHOLECYSTECTOMY  2005   LOOP RECORDER INSERTION N/A 04/15/2018   Procedure: LOOP RECORDER INSERTION;  Surgeon: Sanda Klein, MD;  Location: [place] [facility];  Service: Cardiovascular;  Laterality: N/A;   SKIN BIOPSY     multiple    There were no vitals filed for this visit.   Subjective  Assessment - 05/21/21 0847     Subjective Pt reports that she hasn't experienced any soreness or the usual tightness she has for a couple of days.    Diagnostic tests 03/18/21 - R hip x-ray: 1. No acute osseous abnormality.  2. Heterotopic ossification about the right greater trochanter, in  keeping with sequelae of prior trauma but increased compared to  prior examination.    Patient Stated Goals "to get my muscles stronger and avoid surgery"    Currently in Pain? No/denies                               [facility] Adult PT Treatment/Exercise - 05/21/21 0001       Knee/Hip Exercises: Aerobic   Recumbent Bike L2 x 6 min      Knee/Hip Exercises: Machines for Strengthening   Cybex Knee Flexion 20lb 2x10      Knee/Hip Exercises: Standing   Hip Abduction Stengthening;Both;2 sets;10 reps;Knee straight    Abduction Limitations red TB; abduction with extension for progression   tactile cues needed to avoid pelvic rotation   Lateral Step Up Both;2 sets;10 reps;Hand Hold: 1    Lateral Step Up Limitations step  up and tapping toes onto treadmill with red TB at knees    Functional Squat 2 sets;10 reps    Functional Squat Limitations + red TB hip ABD isometric to discourage genu valgum,      Manual Therapy   Manual Therapy Soft tissue mobilization;Myofascial release    Soft tissue mobilization IASTM with rolling stick to R ITB, hip flexors, quads    Myofascial Release TPR to R hip flexors                       PT Short Term Goals - 05/16/21 0852       PT SHORT TERM GOAL #1   Title Patient will be independent with initial HEP    Status Achieved   05/16/21              PT Long Term Goals - 05/19/21 0851       PT LONG TERM GOAL #1   Title Patient will be independent with ongoing/advanced HEP for self-management at home in order to build upon functional gains in therapy    Status Partially Met    Target Date 06/06/21      PT LONG TERM GOAL #2   Title  Patient to report reduction in frequency and intensity of R hip pain by >/= 50-75% to allow for improved activity tolerance    Status On-going   05/19/21 - Pt reports 40% improvement in pain thus far   Target Date 06/06/21      PT LONG TERM GOAL #3   Title Patient will demonstrate improved B hip strength to >/= 4+/5 for improved stability and ease of mobility    Status On-going    Target Date 06/06/21      PT LONG TERM GOAL #4   Title Patient will report ability to complete walking for exercise at her prior level w/o limitation due to R hip pain or weakness    Status On-going    Target Date 06/06/21      PT LONG TERM GOAL #5   Title Patient to report ability to climb stairs without increased R hip pain    Status Partially Met   05/19/21 - Pt reports she still feels a pull on the R side when negotiating stairs but no pain   Target Date 06/06/21                   Plan - 05/21/21 0914     Clinical Impression Statement Pt did well with all interventions today reporting no pain. Required some tactile cues with resisted ABD to prevent pelvic rotation. Interventions mostly focused on strength today to improve stability of the hip and pelvic girdle. Finished with some STM to her R hip flexors and ITB to reduce soreness and decrease stiffness in the musculature.    Personal Factors and Comorbidities Time since onset of injury/illness/exacerbation;Past/Current Experience;Comorbidity 3+;Age;Social Background    Comorbidities HTN, a-fib, NSVT, near syncope, GERD, diverticulosis, OA, osteoporosis, chronic LBP w/o sciatica, chronic hip pain, Raynaud phenomenon, sleep related leg cramps    Rehab Potential Good    PT Frequency 2x / week    PT Duration 6 weeks    PT Treatment/Interventions ADLs/Self Care Home Management;Cryotherapy;Electrical Stimulation;Iontophoresis 46m/ml Dexamethasone;Moist Heat;Ultrasound;DME Instruction;Gait training;Stair training;Functional mobility training;Therapeutic  activities;Therapeutic exercise;Balance training;Neuromuscular re-education;Patient/family education;Manual techniques;Passive range of motion;Dry needling;Taping;Joint Manipulations    PT Next Visit Plan progress hip/lumbopelvic flexibility and strengthening; manual therapy and modalities PRN including possible ionto patch  PT Home Exercise Plan Access Code: [email or web address] (10/28, updated 11/4 & 11/18)    Consulted and Agree with Plan of Care Patient             Patient will benefit from skilled therapeutic intervention in order to improve the following deficits and impairments:  Decreased activity tolerance, Decreased mobility, Decreased range of motion, Decreased strength, Difficulty walking, Increased fascial restricitons, Increased muscle spasms, Impaired perceived functional ability, Impaired flexibility, Improper body mechanics, Postural dysfunction, Pain  Visit Diagnosis: Pain in right hip  Stiffness of right hip, not elsewhere classified  Muscle weakness (generalized)  Other abnormalities of gait and mobility  Difficulty in walking, not elsewhere classified     Problem List Patient Active Problem List   Diagnosis Date Noted   Elevated coronary artery calcium score 04/09/2021   Nonintractable headache 01/14/2021   Allergic drug reaction 03/26/2020   Vitamin D deficiency 12/28/2019   Complex renal cyst 12/28/2019   Elevated liver function tests 12/22/2019   Bug bite 12/22/2019   Persistent atrial fibrillation ([place]) 11/13/2019   Secondary hypercoagulable state ([place]) 11/13/2019   Encounter for loop recorder check 10/20/2019   Long term (current) use of anticoagulants 10/20/2019   Chronic bilateral low back pain without sciatica 09/24/2019   Statin intolerance 09/11/2019   Diarrhea 08/08/2018   Raynaud phenomenon 08/08/2018   Pulsatile tinnitus 04/26/2018   Hyperglycemia 04/26/2018   Cervicalgia 04/26/2018   Near syncope 04/15/2018   NSVT (nonsustained ventricular  tachycardia) 04/14/2018   Palpitations 02/25/2018   Bilateral carotid bruits 02/25/2018   Myalgia 04/25/2017   Left shoulder pain 04/23/2017   Plantar wart of left foot 10/15/2016   Leg cramps, sleep related 04/02/2016   Hip pain 05/29/2015   Preventative health care 03/24/2015   Bladder prolapse, female, acquired 09/16/2014   Osteoporosis 06/26/2014   Arthritis    GERD (gastroesophageal reflux disease)    Hypertension    Hyperlipidemia    History of kidney stones    H/O hematuria    Diverticulosis    History of chicken pox    Anxiety and depression    SCC (squamous cell carcinoma)    Bowen's disease     Artist Pais, PTA 05/21/2021, 9:40 AM  [facility] [address]  [place], [place], [address] Phone: [phone]   Fax:  [phone]  Name: Sophia [name] MRN: [id number] Date of Birth: 1941/01/08

## 2021-05-26 ENCOUNTER — Ambulatory Visit (INDEPENDENT_AMBULATORY_CARE_PROVIDER_SITE_OTHER): Payer: Medicare HMO

## 2021-05-26 DIAGNOSIS — R55 Syncope and collapse: Secondary | ICD-10-CM

## 2021-05-28 ENCOUNTER — Ambulatory Visit: Payer: Medicare HMO | Admitting: Physical Therapy

## 2021-05-28 ENCOUNTER — Other Ambulatory Visit: Payer: Self-pay

## 2021-05-28 ENCOUNTER — Encounter: Payer: Self-pay | Admitting: Physical Therapy

## 2021-05-28 DIAGNOSIS — M25551 Pain in right hip: Secondary | ICD-10-CM | POA: Diagnosis not present

## 2021-05-28 DIAGNOSIS — M25651 Stiffness of right hip, not elsewhere classified: Secondary | ICD-10-CM | POA: Diagnosis not present

## 2021-05-28 DIAGNOSIS — R2689 Other abnormalities of gait and mobility: Secondary | ICD-10-CM | POA: Diagnosis not present

## 2021-05-28 DIAGNOSIS — M6281 Muscle weakness (generalized): Secondary | ICD-10-CM | POA: Diagnosis not present

## 2021-05-28 DIAGNOSIS — R262 Difficulty in walking, not elsewhere classified: Secondary | ICD-10-CM | POA: Diagnosis not present

## 2021-05-28 NOTE — Therapy (Signed)
[facility] [address]  [place], [place], [address] Phone: [phone]   Fax:  [phone]  Physical Therapy Treatment / Progress Note  Patient Details  Name: Sophia [name] MRN: [id number] Date of Birth: Jul 03, 1940 Referring Provider (PT): Vickki Hearing, MD  Progress Note  Reporting Period 04/25/2021 to 05/28/2021  See note below for Objective Data and Assessment of Progress/Goals.     Encounter Date: 05/28/2021   PT End of Session - 05/28/21 0844     Visit Number 9    Number of Visits 13    Date for PT Re-Evaluation 06/06/21    Authorization Type [employer]    Authorization Time Period 04/29/21 - 06/06/21    Authorization - Visit Number 8    Authorization - Number of Visits 12    Progress Note Due on Visit 10    PT Start Time 7169    PT Stop Time 0928    PT Time Calculation (min) 44 min    Activity Tolerance Patient tolerated treatment well    Behavior During Therapy [facility] for tasks assessed/performed             Past Medical History:  Diagnosis Date   Anxiety and depression    Arthritis    hands, knees, etc   Bladder prolapse, female, acquired 09/16/2014   Bowen's disease    Diverticulitis    Diverticulosis    GERD (gastroesophageal reflux disease)    H/O hematuria    for several years, work only revealed kidney stone on right side   History of chicken pox    History of kidney stones    Hyperlipidemia    Hypertension    Leg cramps, sleep related 04/02/2016   Myalgia 04/25/2017   Osteopenia 06/26/2014   Osteoporosis 06/26/2014   Plantar wart of left foot 10/15/2016   Right shoulder pain 04/23/2017   SCC (squamous cell carcinoma)    arms   Statin intolerance 09/11/2019    Past Surgical History:  Procedure Laterality Date   CHOLECYSTECTOMY  2005   LOOP RECORDER INSERTION N/A 04/15/2018   Procedure: LOOP RECORDER INSERTION;  Surgeon: Sanda Klein, MD;  Location: [place] [facility];  Service: Cardiovascular;  Laterality: N/A;   SKIN BIOPSY     multiple    There were no vitals filed for this visit.   Subjective Assessment - 05/28/21 0847     Subjective Pt reports no recent pain and feels like she is doing well.    Diagnostic tests 03/18/21 - R hip x-ray: 1. No acute osseous abnormality.  2. Heterotopic ossification about the right greater trochanter, in  keeping with sequelae of prior trauma but increased compared to  prior examination.    Patient Stated Goals "to get my muscles stronger and avoid surgery"    Currently in Pain? No/denies                [facility] PT Assessment - 05/28/21 0844       Assessment   Medical Diagnosis R hip pain    Referring Provider (PT) Vickki Hearing, MD    Onset Date/Surgical Date 03/17/21    Next MD Visit PRN      Strength   Right Hip Flexion 4+/5    Right Hip Extension 4/5    Right Hip External Rotation  4+/5    Right Hip Internal Rotation 4+/5    Right Hip ABduction 4/5    Right Hip ADduction 4/5  Left Hip Flexion 4+/5    Left Hip Extension 4/5    Left Hip External Rotation 4+/5    Left Hip Internal Rotation 4+/5    Left Hip ABduction 4/5    Left Hip ADduction 4+/5    Right Knee Flexion 5/5    Right Knee Extension 5/5    Left Knee Flexion 5/5    Left Knee Extension 5/5    Right Ankle Dorsiflexion 4+/5    Right Ankle Plantar Flexion 5/5    Left Ankle Dorsiflexion 4+/5    Left Ankle Plantar Flexion 5/5      Flexibility   Hamstrings mild tight    Quadriceps mild tight    ITB mild tight    Piriformis mild tight R    Obturator Internus WFL but slight pain R                           [facility] Adult PT Treatment/Exercise - 05/28/21 0844       Knee/Hip Exercises: Aerobic   Nustep L5 x 6 min (UE/LE)      Knee/Hip Exercises: Standing   Hip Abduction Right;Left;2 sets;10 reps;Stengthening;Knee straight    Abduction Limitations looped green TB at knees; 1st set - straight plane abduction; 2nd  set - 45 into extension   UE support on back of chair   Hip Extension Right;Left;10 reps;Stengthening;Knee straight    Extension Limitations looped red TB at knees   UE support on back of chair   Functional Squat 10 reps    Functional Squat Limitations + green TB hip ABD isometric to discourage genu valgum,    SLS with Vectors R/L SLS + 3-way opp toe tap to cones x 10; 2# cuff wt at ankle    Other Standing Knee Exercises B side stepping & fwd monster walk with looped green TB at knees 2 x 20 ft each    Other Standing Knee Exercises R/L SLS + opp LE slight hip hike with circumduction fwd/back arc with 2# cuff wt at ankle x 10                       PT Short Term Goals - 05/16/21 0852       PT SHORT TERM GOAL #1   Title Patient will be independent with initial HEP    Status Achieved   05/16/21              PT Long Term Goals - 05/28/21 0850       PT LONG TERM GOAL #1   Title Patient will be independent with ongoing/advanced HEP for self-management at home in order to build upon functional gains in therapy    Status Partially Met    Target Date 06/06/21      PT LONG TERM GOAL #2   Title Patient to report reduction in frequency and intensity of R hip pain by >/= 50-75% to allow for improved activity tolerance    Status Achieved   05/28/21 - Pt reports 50% improvement in pain thus far     PT LONG TERM GOAL #3   Title Patient will demonstrate improved B hip strength to >/= 4+/5 for improved stability and ease of mobility    Status Partially Met    Target Date 06/06/21      PT LONG TERM GOAL #4   Title Patient will report ability to complete walking for exercise at her prior level w/o  limitation due to R hip pain or weakness    Status Achieved   05/28/21 - Pt reports she is able to walk 1 mile w/o limitation due to pain (2x her prior level)     PT LONG TERM GOAL #5   Title Patient to report ability to climb stairs without increased R hip pain    Status Achieved    05/28/21                  Plan - 05/28/21 0849     Clinical Impression Statement Lovey Newcomer reports 50% improvement in pain and activity tolerance thus far, noting that she is now able to walk 1 mile including inclines as well as climb stair w/o R hip pain - LTGs #2, 4 & 5 met. MMT revealing good strength gains with remaining weakness most pronounced in R>L hip abduction & extension. Pt able to tolerate progression of resistance with upgrade to green TB and addition of 2# cuff wts with other standing exercises. Remaining LTGs now at least partially met and pt appears to be on track to transition to her POC at the end of her current episode following continued skilled PT to address remaining strength deficits.    Comorbidities HTN, a-fib, NSVT, near syncope, GERD, diverticulosis, OA, osteoporosis, chronic LBP w/o sciatica, chronic hip pain, Raynaud phenomenon, sleep related leg cramps    Rehab Potential Good    PT Frequency 2x / week    PT Duration 6 weeks    PT Treatment/Interventions ADLs/Self Care Home Management;Cryotherapy;Electrical Stimulation;Iontophoresis 30m/ml Dexamethasone;Moist Heat;Ultrasound;DME Instruction;Gait training;Stair training;Functional mobility training;Therapeutic activities;Therapeutic exercise;Balance training;Neuromuscular re-education;Patient/family education;Manual techniques;Passive range of motion;Dry needling;Taping;Joint Manipulations    PT Next Visit Plan progress hip/lumbopelvic flexibility and strengthening; manual therapy and modalities PRN including possible ionto patch    PT Home Exercise Plan Access Code: [email or web address] (10/28, updated 11/4 & 11/18)    Consulted and Agree with Plan of Care Patient             Patient will benefit from skilled therapeutic intervention in order to improve the following deficits and impairments:  Decreased activity tolerance, Decreased mobility, Decreased range of motion, Decreased strength, Difficulty walking, Increased  fascial restricitons, Increased muscle spasms, Impaired perceived functional ability, Impaired flexibility, Improper body mechanics, Postural dysfunction, Pain  Visit Diagnosis: Pain in right hip  Stiffness of right hip, not elsewhere classified  Muscle weakness (generalized)  Other abnormalities of gait and mobility  Difficulty in walking, not elsewhere classified     Problem List Patient Active Problem List   Diagnosis Date Noted   Elevated coronary artery calcium score 04/09/2021   Nonintractable headache 01/14/2021   Allergic drug reaction 03/26/2020   Vitamin D deficiency 12/28/2019   Complex renal cyst 12/28/2019   Elevated liver function tests 12/22/2019   Bug bite 12/22/2019   [place]istent [place] fibrillation [place] 11/13/2019   Secondary [place]coagulable [place] 11/13/2019   Encounter for loop recorder check 10/20/2019   Long term (current) use of anticoagulants 10/20/2019   Chronic bilateral low back pain without sciatica 09/24/2019   Statin intolerance 09/11/2019   Diarrhea 08/08/2018   Raynaud phenomenon 08/08/2018   Pulsatile tinnitus 04/26/2018   Hyperglycemia 04/26/2018   Cervicalgia 04/26/2018   Near syncope 04/15/2018   NSVT (nonsustained ventricular tachycardia) 04/14/2018   Palpitations 02/25/2018   Bilateral carotid bruits 02/25/2018   Myalgia 04/25/2017   Left shoulder pain 04/23/2017   Plantar wart of left foot 10/15/2016   Leg cramps, sleep related 04/02/2016  Hip pain 05/29/2015   Preventative health care 03/24/2015   Bladder prolapse, female, acquired 09/16/2014   Osteoporosis 06/26/2014   Arthritis    GERD (gastroesophageal reflux disease)    Hypertension    Hyperlipidemia    History of kidney stones    H/O hematuria    Diverticulosis    History of chicken pox    Anxiety and depression    SCC (squamous cell carcinoma)    Bowen's disease     Percival Spanish, PT 05/28/2021, 9:31 AM  [facility] [address]  [place], [place], [address] Phone: [phone]   Fax:  [phone]  Name: EARNESTINE SHIPP MRN: [id number] Date of Birth: [birth year]

## 2021-05-29 ENCOUNTER — Telehealth: Payer: Self-pay | Admitting: Internal Medicine

## 2021-05-29 DIAGNOSIS — E782 Mixed hyperlipidemia: Secondary | ICD-10-CM

## 2021-05-29 NOTE — Telephone Encounter (Signed)
Lipid panel ordered Lab slip mailed to patient w/reminder to schedule routine 1 year lipid clinic visit with Dr. Debara Pickett

## 2021-05-30 ENCOUNTER — Other Ambulatory Visit: Payer: Self-pay

## 2021-05-30 ENCOUNTER — Ambulatory Visit: Payer: Medicare HMO | Attending: Sports Medicine | Admitting: Physical Therapy

## 2021-05-30 ENCOUNTER — Encounter: Payer: Self-pay | Admitting: Physical Therapy

## 2021-05-30 DIAGNOSIS — R2689 Other abnormalities of gait and mobility: Secondary | ICD-10-CM | POA: Diagnosis not present

## 2021-05-30 DIAGNOSIS — M25651 Stiffness of right hip, not elsewhere classified: Secondary | ICD-10-CM | POA: Diagnosis not present

## 2021-05-30 DIAGNOSIS — M6281 Muscle weakness (generalized): Secondary | ICD-10-CM | POA: Diagnosis not present

## 2021-05-30 DIAGNOSIS — R262 Difficulty in walking, not elsewhere classified: Secondary | ICD-10-CM | POA: Diagnosis not present

## 2021-05-30 DIAGNOSIS — M25551 Pain in right hip: Secondary | ICD-10-CM | POA: Insufficient documentation

## 2021-05-30 NOTE — Patient Instructions (Signed)
   Access Code: [facility] URL: [email or web address] Date: 05/30/2021 Prepared by: Annie Paras  Exercises Hooklying Hamstring Stretch with Strap - 2-3 x daily - 7 x weekly - 3 reps - 30 sec hold Supine Piriformis Stretch with Foot on Ground - 2-3 x daily - 7 x weekly - 3 reps - 30 sec hold Supine Figure 4 Piriformis Stretch - 2-3 x daily - 7 x weekly - 3 reps - 30 sec hold Modified Thomas Stretch - 2-3 x daily - 7 x weekly - 3 reps - 30 sec hold Supine Hip Adductor Stretch - 2-3 x daily - 7 x weekly - 3 reps - 30 sec hold Standing ITB Stretch - 2-3 x daily - 7 x weekly - 3 reps - 30 sec hold Hooklying Isometric Clamshell - 1 x daily - 7 x weekly - 2 sets - 10 reps - 3 sec hold Bridge with Resistance - 1 x daily - 7 x weekly - 2 sets - 10 reps - 5 sec hold Clamshell with Resistance - 1 x daily - 7 x weekly - 2 sets - 10 reps - 3-5 sec hold Supine Hip Adduction Isometric with Ball - 1 x daily - 7 x weekly - 2 sets - 10 reps - 5 sec hold Supine Bridge with Mini Swiss Ball Between Knees - 1 x daily - 7 x weekly - 2 sets - 10 reps - 5 sec hold Diagonal Hip Extension - 1 x daily - 7 x weekly - 2 sets - 10 reps - 3 sec hold Side Stepping with Resistance at Thighs and Counter Support - 1 x daily - 3-4 x weekly - 2 sets - 10 reps - 3 sec hold Forward Backward Monster Walk with Band at [employer] and Counter Support - 1 x daily - 3-4 x weekly - 2 sets - 10 reps Quadruped Hip Extension Kicks - 1 x daily - 3 x weekly - 2 sets - 10 reps - 3 sec hold Quadruped Fire Hydrant - 1 x daily - 3 x weekly - 2 sets - 10 reps - 3 sec hold Side Lunge with Counter Support - 1 x daily - 3 x weekly - 2 sets - 10 reps - 3 sec hold  Patient Education [place] Pt Instructions - [facility]

## 2021-05-30 NOTE — Therapy (Addendum)
[facility] [address]  [place], [place], [address] Phone: [phone]   Fax:  [phone]  Physical Therapy Treatment  Patient Details  Name: Sophia [name] MRN: [id number] Date of Birth: 1940/10/12 Referring Provider (PT): Vickki Hearing, MD   Encounter Date: 05/30/2021   PT End of Session - 05/30/21 0844     Visit Number 10    Number of Visits 13    Date for PT Re-Evaluation 06/06/21    Authorization Type [employer]    Authorization Time Period 04/29/21 - 06/06/21    Authorization - Visit Number 9    Authorization - Number of Visits 12    Progress Note Due on Visit 13   PN completed on visit #9 - 05/28/21   PT Start Time 0844   PT Stop Time 928   Activity Tolerance Patient tolerated treatment well    Behavior During Therapy [facility] for tasks assessed/performed             Past Medical History:  Diagnosis Date   Anxiety and depression    Arthritis    hands, knees, etc   Bladder prolapse, female, acquired 09/16/2014   Bowen's disease    Diverticulitis    Diverticulosis    GERD (gastroesophageal reflux disease)    H/O hematuria    for several years, work only revealed kidney stone on right side   History of chicken pox    History of kidney stones    Hyperlipidemia    Hypertension    Leg cramps, sleep related 04/02/2016   Myalgia 04/25/2017   Osteopenia 06/26/2014   Osteoporosis 06/26/2014   Plantar wart of left foot 10/15/2016   Right shoulder pain 04/23/2017   SCC (squamous cell carcinoma)    arms   Statin intolerance 09/11/2019    Past Surgical History:  Procedure Laterality Date   CHOLECYSTECTOMY  2005   LOOP RECORDER INSERTION N/A 04/15/2018   Procedure: LOOP RECORDER INSERTION;  Surgeon: Sanda Klein, MD;  Location: [facility];  Service: Cardiovascular;  Laterality: N/A;   SKIN BIOPSY     multiple    There were no vitals filed for this visit.   Subjective Assessment -  05/30/21 0850     Subjective Pt reports trying the resistance bands at her ankles left her more sore at her ankles than when she places the band at her knees.    Diagnostic tests 03/18/21 - R hip x-ray: 1. No acute osseous abnormality.  2. Heterotopic ossification about the right greater trochanter, in  keeping with sequelae of prior trauma but increased compared to  prior examination.    Patient Stated Goals "to get my muscles stronger and avoid surgery"    Currently in Pain? No/denies                               [facility] Adult PT Treatment/Exercise - 05/30/21 0844       Lumbar Exercises: Quadruped   Straight Leg Raise 10 reps;3 seconds    Other Quadruped Lumbar Exercises R/L bent knee hip extension 10 x 3"      Knee/Hip Exercises: Aerobic   Nustep L6 x 6 min (UE/LE)      Knee/Hip Exercises: Standing   Side Lunges Right;Left;2 sets;10 reps;3 seconds    Side Lunges Limitations TRX & counter support x 1 set each    Hip Abduction Right;Left;10 reps;Stengthening;Knee bent  Abduction Limitations Fitter (1 black)    Hip Extension Right;Left;10 reps;Stengthening;Knee straight    Extension Limitations Fitter (1 black)    Lateral Step Up Both;10 reps;Step Height: 6";Hand Hold: 2    Lateral Step Up Limitations cues for level pelvis   UE support of window sill   Functional Squat 10 reps;3 seconds    Functional Squat Limitations TRX                     PT Education - 05/30/21 0928     Education Details HEP update - quadruped exercises and side lunges - Access Code: [facility]    Person(s) Educated Patient    Methods Explanation;Demonstration;Verbal cues;Handout    Comprehension Verbalized understanding;Verbal cues required;Returned demonstration;Need further instruction              PT Short Term Goals - 05/16/21 0852       PT SHORT TERM GOAL #1   Title Patient will be independent with initial HEP    Status Achieved   05/16/21               PT Long Term Goals - 05/28/21 0850       PT LONG TERM GOAL #1   Title Patient will be independent with ongoing/advanced HEP for self-management at home in order to build upon functional gains in therapy    Status Partially Met    Target Date 06/06/21      PT LONG TERM GOAL #2   Title Patient to report reduction in frequency and intensity of R hip pain by >/= 50-75% to allow for improved activity tolerance    Status Achieved   05/28/21 - Pt reports 50% improvement in pain thus far     PT LONG TERM GOAL #3   Title Patient will demonstrate improved B hip strength to >/= 4+/5 for improved stability and ease of mobility    Status Partially Met    Target Date 06/06/21      PT LONG TERM GOAL #4   Title Patient will report ability to complete walking for exercise at her prior level w/o limitation due to R hip pain or weakness    Status Achieved   05/28/21 - Pt reports she is able to walk 1 mile w/o limitation due to pain (2x her prior level)     PT LONG TERM GOAL #5   Title Patient to report ability to climb stairs without increased R hip pain    Status Achieved   05/28/21                  Plan - 05/30/21 0928     Clinical Impression Statement Sophia [name] reports increased soreness at her ankles after trying to use the green TB for her standing hip exercises - reviewed mechanics of increasing lever arm and suggested using red TB if band at ankles or green TB if band at knees when performing the exercises. Progressed glute strengthening with introduction of Fitter and quadruped exercises with good tolerance. Advanced squats to TRX and introduced side lunges for more unilateral strengthening - also well tolerated. HEP update provided at pt request with instructions to vary strengthening exercises aiming for ~3x/wk alternating exercises from day to day.    Comorbidities HTN, a-fib, NSVT, near syncope, GERD, diverticulosis, OA, osteoporosis, chronic LBP w/o sciatica, chronic hip pain, Raynaud  phenomenon, sleep related leg cramps    Rehab Potential Good    PT Frequency 2x / week  PT Duration 6 weeks    PT Treatment/Interventions ADLs/Self Care Home Management;Cryotherapy;Electrical Stimulation;Iontophoresis 17m/ml Dexamethasone;Moist Heat;Ultrasound;DME Instruction;Gait training;Stair training;Functional mobility training;Therapeutic activities;Therapeutic exercise;Balance training;Neuromuscular re-education;Patient/family education;Manual techniques;Passive range of motion;Dry needling;Taping;Joint Manipulations    PT Next Visit Plan HEP review & consolidation PRN in prep for transition to HEP - hip/lumbopelvic flexibility and strengthening; manual therapy and modalities PRN including possible ionto patch    PT Home Exercise Plan Access Code: [email or web address] (10/28, updated 11/4, 11/18 & 12/2)    Consulted and Agree with Plan of Care Patient             Patient will benefit from skilled therapeutic intervention in order to improve the following deficits and impairments:  Decreased activity tolerance, Decreased mobility, Decreased range of motion, Decreased strength, Difficulty walking, Increased fascial restricitons, Increased muscle spasms, Impaired perceived functional ability, Impaired flexibility, Improper body mechanics, Postural dysfunction, Pain  Visit Diagnosis: Pain in right hip  Stiffness of right hip, not elsewhere classified  Muscle weakness (generalized)  Other abnormalities of gait and mobility  Difficulty in walking, not elsewhere classified     Problem List Patient Active Problem List   Diagnosis Date Noted   Elevated coronary artery calcium score 04/09/2021   Nonintractable headache 01/14/2021   Allergic drug reaction 03/26/2020   Vitamin D deficiency 12/28/2019   Complex renal cyst 12/28/2019   Elevated liver function tests 12/22/2019   Bug bite 12/22/2019   Persistent atrial fibrillation ([facility] 11/13/2019   Secondary hypercoagulable state ([facility]  11/13/2019   Encounter for loop recorder check 10/20/2019   Long term (current) use of anticoagulants 10/20/2019   Chronic bilateral low back pain without sciatica 09/24/2019   Statin intolerance 09/11/2019   Diarrhea 08/08/2018   Raynaud phenomenon 08/08/2018   Pulsatile tinnitus 04/26/2018   Hyperglycemia 04/26/2018   Cervicalgia 04/26/2018   Near syncope 04/15/2018   NSVT (nonsustained ventricular tachycardia) 04/14/2018   Palpitations 02/25/2018   Bilateral carotid bruits 02/25/2018   Myalgia 04/25/2017   Left shoulder pain 04/23/2017   Plantar wart of left foot 10/15/2016   Leg cramps, sleep related 04/02/2016   Hip pain 05/29/2015   Preventative health care 03/24/2015   Bladder prolapse, female, acquired 09/16/2014   Osteoporosis 06/26/2014   Arthritis    GERD (gastroesophageal reflux disease)    Hypertension    Hyperlipidemia    History of kidney stones    H/O hematuria    Diverticulosis    History of chicken pox    Anxiety and depression    SCC (squamous cell carcinoma)    Bowen's disease     JPercival Spanish PT 05/30/2021, 12:43 PM  [facility] [address] [place] [address]Phone: [phone]  Fax:  [phone] Name: Sophia ROCKMANMRN: [id number] of Birth: [birth year]

## 2021-06-03 NOTE — Progress Notes (Signed)
Carelink Summary Report / Loop Recorder 

## 2021-06-04 ENCOUNTER — Other Ambulatory Visit: Payer: Self-pay

## 2021-06-04 ENCOUNTER — Ambulatory Visit: Payer: Medicare HMO

## 2021-06-04 DIAGNOSIS — R262 Difficulty in walking, not elsewhere classified: Secondary | ICD-10-CM

## 2021-06-04 DIAGNOSIS — M25551 Pain in right hip: Secondary | ICD-10-CM | POA: Diagnosis not present

## 2021-06-04 DIAGNOSIS — M25651 Stiffness of right hip, not elsewhere classified: Secondary | ICD-10-CM | POA: Diagnosis not present

## 2021-06-04 DIAGNOSIS — M6281 Muscle weakness (generalized): Secondary | ICD-10-CM | POA: Diagnosis not present

## 2021-06-04 DIAGNOSIS — R2689 Other abnormalities of gait and mobility: Secondary | ICD-10-CM

## 2021-06-04 NOTE — Therapy (Signed)
[facility] [address]  [place], [place], [address] Phone: [phone]   Fax:  [phone]  Physical Therapy Treatment  Patient Details  Name: Sophia [name] MRN: [id number] Date of Birth: April 14, 1941 Referring Provider (PT): Vickki Hearing, MD   Encounter Date: 06/04/2021   PT End of Session - 06/04/21 0931     Visit Number 11    Number of Visits 13    Date for PT Re-Evaluation 06/06/21    Authorization Type [employer]    Authorization Time Period 04/29/21 - 06/06/21    Authorization - Visit Number 10    Authorization - Number of Visits 12    Progress Note Due on Visit 13   PN completed on visit #9 - 05/28/21   PT Start Time 0846    PT Stop Time 0929    PT Time Calculation (min) 43 min    Activity Tolerance Patient tolerated treatment well    Behavior During Therapy [facility] for tasks assessed/performed             Past Medical History:  Diagnosis Date   Anxiety and depression    Arthritis    hands, knees, etc   Bladder prolapse, female, acquired 09/16/2014   Bowen's disease    Diverticulitis    Diverticulosis    GERD (gastroesophageal reflux disease)    H/O hematuria    for several years, work only revealed kidney stone on right side   History of chicken pox    History of kidney stones    Hyperlipidemia    Hypertension    Leg cramps, sleep related 04/02/2016   Myalgia 04/25/2017   Osteopenia 06/26/2014   Osteoporosis 06/26/2014   Plantar wart of left foot 10/15/2016   Right shoulder pain 04/23/2017   SCC (squamous cell carcinoma)    arms   Statin intolerance 09/11/2019    Past Surgical History:  Procedure Laterality Date   CHOLECYSTECTOMY  2005   LOOP RECORDER INSERTION N/A 04/15/2018   Procedure: LOOP RECORDER INSERTION;  Surgeon: Sanda Klein, MD;  Location: [facility];  Service: Cardiovascular;  Laterality: N/A;   SKIN BIOPSY     multiple    There were no vitals filed  for this visit.   Subjective Assessment - 06/04/21 0845     Subjective Pt reports that she feels much stronger and more flexible in her hip joints.    Diagnostic tests 03/18/21 - R hip x-ray: 1. No acute osseous abnormality.  2. Heterotopic ossification about the right greater trochanter, in  keeping with sequelae of prior trauma but increased compared to  prior examination.    Patient Stated Goals "to get my muscles stronger and avoid surgery"    Currently in Pain? No/denies                               [facility] Adult PT Treatment/Exercise - 06/04/21 0001       Lumbar Exercises: Stretches   Standing Side Bend Right    Standing Side Bend Limitations 10 reps with UE support on wall    Other Lumbar Stretch Exercise trunk rotations in standing 10x3"      Lumbar Exercises: Quadruped   Straight Leg Raise 10 reps;3 seconds   Bilat   Other Quadruped Lumbar Exercises B fire hydrants 10x3"      Knee/Hip Exercises: Aerobic   Nustep L5 x 6 min (UE/LE)  Knee/Hip Exercises: Standing   Forward Lunges Both;10 reps;2 seconds    Forward Lunges Limitations counter support,    Functional Squat 10 reps;3 seconds    Functional Squat Limitations at counter    Wall Squat 5 reps;3 seconds      Knee/Hip Exercises: Seated   Long Arc Quad Strengthening;Both;10 reps    Long Arc Quad Limitations red TB anchored on floor    Clamshell with TheraBand Red   10x5"   Hamstring Curl Strengthening;Both;10 reps    Hamstring Limitations red TB anchored on floor                     PT Education - 06/04/21 0931     Education Details HEP update- fwd lunge    Person(s) Educated Patient    Methods Explanation;Demonstration;Handout    Comprehension Verbalized understanding;Returned demonstration              PT Short Term Goals - 05/16/21 0852       PT SHORT TERM GOAL #1   Title Patient will be independent with initial HEP    Status Achieved   05/16/21               PT Long Term Goals - 05/28/21 0850       PT LONG TERM GOAL #1   Title Patient will be independent with ongoing/advanced HEP for self-management at home in order to build upon functional gains in therapy    Status Partially Met    Target Date 06/06/21      PT LONG TERM GOAL #2   Title Patient to report reduction in frequency and intensity of R hip pain by >/= 50-75% to allow for improved activity tolerance    Status Achieved   05/28/21 - Pt reports 50% improvement in pain thus far     PT LONG TERM GOAL #3   Title Patient will demonstrate improved B hip strength to >/= 4+/5 for improved stability and ease of mobility    Status Partially Met    Target Date 06/06/21      PT LONG TERM GOAL #4   Title Patient will report ability to complete walking for exercise at her prior level w/o limitation due to R hip pain or weakness    Status Achieved   05/28/21 - Pt reports she is able to walk 1 mile w/o limitation due to pain (2x her prior level)     PT LONG TERM GOAL #5   Title Patient to report ability to climb stairs without increased R hip pain    Status Achieved   05/28/21                  Plan - 06/04/21 0932     Clinical Impression Statement Pt noted some problems with side lunges at home, she was more comfortable with forward lunges, so we added these to HEP. Progressed to wall squats today with decreased ROM for the patients comfort and did some isolated knee strengthening exercises. She needed cues for neutral spine with the quadruped exercises and required break in between each exercise due to her wrist.    Personal Factors and Comorbidities Time since onset of injury/illness/exacerbation;Past/Current Experience;Comorbidity 3+;Age;Social Background    Comorbidities HTN, a-fib, NSVT, near syncope, GERD, diverticulosis, OA, osteoporosis, chronic LBP w/o sciatica, chronic hip pain, Raynaud phenomenon, sleep related leg cramps    PT Frequency 2x / week    PT Duration 6  weeks  PT Treatment/Interventions ADLs/Self Care Home Management;Cryotherapy;Electrical Stimulation;Iontophoresis 5m/ml Dexamethasone;Moist Heat;Ultrasound;DME Instruction;Gait training;Stair training;Functional mobility training;Therapeutic activities;Therapeutic exercise;Balance training;Neuromuscular re-education;Patient/family education;Manual techniques;Passive range of motion;Dry needling;Taping;Joint Manipulations    PT Next Visit Plan HEP review & consolidation PRN in prep for transition to HEP- transition to HEP, assess strength    PT Home Exercise Plan Access Code: [email or web address] (10/28, updated 11/4, 11/18 & 12/2)    Consulted and Agree with Plan of Care Patient             Patient will benefit from skilled therapeutic intervention in order to improve the following deficits and impairments:  Decreased activity tolerance, Decreased mobility, Decreased range of motion, Decreased strength, Difficulty walking, Increased fascial restricitons, Increased muscle spasms, Impaired perceived functional ability, Impaired flexibility, Improper body mechanics, Postural dysfunction, Pain  Visit Diagnosis: Pain in right hip  Stiffness of right hip, not elsewhere classified  Muscle weakness (generalized)  Other abnormalities of gait and mobility  Difficulty in walking, not elsewhere classified     Problem List Patient Active Problem List   Diagnosis Date Noted   Elevated coronary artery calcium score 04/09/2021   Nonintractable headache 01/14/2021   Allergic drug reaction 03/26/2020   Vitamin D deficiency 12/28/2019   Complex renal cyst 12/28/2019   Elevated liver function tests 12/22/2019   Bug bite 12/22/2019   Persistent atrial fibrillation ([facility] 11/13/2019   Secondary hypercoagulable state ([facility] 11/13/2019   Encounter for loop recorder check 10/20/2019   Long term (current) use of anticoagulants 10/20/2019   Chronic bilateral low back pain without sciatica 09/24/2019   Statin  intolerance 09/11/2019   Diarrhea 08/08/2018   Raynaud phenomenon 08/08/2018   Pulsatile tinnitus 04/26/2018   Hyperglycemia 04/26/2018   Cervicalgia 04/26/2018   Near syncope 04/15/2018   NSVT (nonsustained ventricular tachycardia) 04/14/2018   Palpitations 02/25/2018   Bilateral carotid bruits 02/25/2018   Myalgia 04/25/2017   Left shoulder pain 04/23/2017   Plantar wart of left foot 10/15/2016   Leg cramps, sleep related 04/02/2016   Hip pain 05/29/2015   Preventative health care 03/24/2015   Bladder prolapse, female, acquired 09/16/2014   Osteoporosis 06/26/2014   Arthritis    GERD (gastroesophageal reflux disease)    Hypertension    Hyperlipidemia    History of kidney stones    H/O hematuria    Diverticulosis    History of chicken pox    Anxiety and depression    SCC (squamous cell carcinoma)    Bowen's disease     BArtist Pais PTA 06/04/2021, 9:55 AM  [facility] [place] [address]Phone: [phone]  Fax:  [phone] Name: SMARIENA MEARESMRN: [id number] of Birth: 1January 13, 1942

## 2021-06-06 ENCOUNTER — Ambulatory Visit: Payer: Medicare HMO | Admitting: Physical Therapy

## 2021-06-06 ENCOUNTER — Telehealth: Payer: Self-pay | Admitting: Internal Medicine

## 2021-06-06 ENCOUNTER — Other Ambulatory Visit: Payer: Self-pay

## 2021-06-06 ENCOUNTER — Encounter: Payer: Self-pay | Admitting: Physical Therapy

## 2021-06-06 DIAGNOSIS — R262 Difficulty in walking, not elsewhere classified: Secondary | ICD-10-CM

## 2021-06-06 DIAGNOSIS — R2689 Other abnormalities of gait and mobility: Secondary | ICD-10-CM

## 2021-06-06 DIAGNOSIS — M25651 Stiffness of right hip, not elsewhere classified: Secondary | ICD-10-CM

## 2021-06-06 DIAGNOSIS — M6281 Muscle weakness (generalized): Secondary | ICD-10-CM | POA: Diagnosis not present

## 2021-06-06 DIAGNOSIS — M25551 Pain in right hip: Secondary | ICD-10-CM

## 2021-06-06 NOTE — Telephone Encounter (Signed)
Attempted PA for [employer] via [place] already on file for this request.  Authorization starting on 06/29/2020 and ending on 06/28/2022

## 2021-06-06 NOTE — Therapy (Signed)
[facility] [place] [address]  [address] [place], [place], [address] Phone: [phone]   Fax:  [phone]  Physical Therapy Treatment / Discharge Summary  Patient Details  Name: Sophia [name] MRN: [id number] Date of Birth: 02/24/1941 Referring Provider (PT): Adam Kendall, MD   Encounter Date: 06/06/2021   PT End of Session - 06/06/21 0854     Visit Number 12    Number of Visits 13    Date for PT Re-Evaluation 06/06/21    Authorization Type [employer]    Authorization Time Period 04/29/21 - 06/06/21    Authorization - Visit Number 11    Authorization - Number of Visits 12    Progress Note Due on Visit --    PT Start Time 0854   Pt arrived late   PT Stop Time 0928    PT Time Calculation (min) 34 min    Activity Tolerance Patient tolerated treatment well    Behavior During Therapy WFL for tasks assessed/performed             Past Medical History:  Diagnosis Date   Anxiety and depression    Arthritis    hands, knees, etc   Bladder prolapse, female, acquired 09/16/2014   Bowen's disease    Diverticulitis    Diverticulosis    GERD (gastroesophageal reflux disease)    H/O hematuria    for several years, work only revealed kidney stone on right side   History of chicken pox    History of kidney stones    Hyperlipidemia    Hypertension    Leg cramps, sleep related 04/02/2016   Myalgia 04/25/2017   Osteopenia 06/26/2014   Osteoporosis 06/26/2014   Plantar wart of left foot 10/15/2016   Right shoulder pain 04/23/2017   SCC (squamous cell carcinoma)    arms   Statin intolerance 09/11/2019    Past Surgical History:  Procedure Laterality Date   CHOLECYSTECTOMY  2005   LOOP RECORDER INSERTION N/A 04/15/2018   Procedure: LOOP RECORDER INSERTION;  Surgeon: Croitoru, Mihai, MD;  Location: [facility];  Service: Cardiovascular;  Laterality: N/A;   SKIN BIOPSY     multiple    There were no vitals filed  for this visit.   Subjective Assessment - 06/06/21 0857     Subjective Pt noting some increased muscle soreness left over from her last PT visit but denies any pain. She feels ready to proceed with the planned transition to her HEP today.    Diagnostic tests 03/18/21 - R hip x-ray: 1. No acute osseous abnormality.  2. Heterotopic ossification about the right greater trochanter, in  keeping with sequelae of prior trauma but increased compared to  prior examination.    Patient Stated Goals "to get my muscles stronger and avoid surgery"    Currently in Pain? No/denies                [facility] PT Assessment - 06/06/21 0854       Assessment   Medical Diagnosis R hip pain    Referring Provider (PT) Adam Kendall, MD    Onset Date/Surgical Date 03/17/21    Next MD Visit PRN      Observation/Other Assessments   Focus on Therapeutic Outcomes (FOTO)  Hip = 84 (32 point improvement from eval); exceeding predicted D/C FS=67      Strength   Right Hip Flexion 5/5    Right Hip Extension 4+/5    Right   Hip External Rotation  4+/5    Right Hip Internal Rotation 5/5    Right Hip ABduction 4/5    Right Hip ADduction 4+/5    Left Hip Flexion 5/5    Left Hip Extension 4+/5    Left Hip External Rotation 4+/5    Left Hip Internal Rotation 5/5    Left Hip ABduction 4+/5    Left Hip ADduction 5/5    Right Knee Flexion 5/5    Right Knee Extension 5/5    Left Knee Flexion 5/5    Left Knee Extension 5/5    Right Ankle Dorsiflexion 4+/5    Right Ankle Plantar Flexion 5/5    Left Ankle Dorsiflexion 4+/5    Left Ankle Plantar Flexion 5/5                           [facility] Adult PT Treatment/Exercise - 06/06/21 0854       Knee/Hip Exercises: Aerobic   Nustep L5 x 6 min (UE/LE)                     PT Education - 06/06/21 0925     Education Details Overview of HEP identifying most important ongoing exercsises and maintenance level frequency    Person(s) Educated Patient     Methods Explanation    Comprehension Verbalized understanding              PT Short Term Goals - 05/16/21 0852       PT SHORT TERM GOAL #1   Title Patient will be independent with initial HEP    Status Achieved   05/16/21              PT Long Term Goals - 06/06/21 0903       PT LONG TERM GOAL #1   Title Patient will be independent with ongoing/advanced HEP for self-management at home in order to build upon functional gains in therapy    Status Achieved   06/06/21     PT LONG TERM GOAL #2   Title Patient to report reduction in frequency and intensity of R hip pain by >/= 50-75% to allow for improved activity tolerance    Status Achieved   06/06/21 - Pt reports pain is now gone     PT LONG TERM GOAL #3   Title Patient will demonstrate improved B hip strength to >/= 4+/5 for improved stability and ease of mobility    Status Achieved   06/06/21 - grossly met except R hip ABD 4/5   Target Date --      PT LONG TERM GOAL #4   Title Patient will report ability to complete walking for exercise at her prior level w/o limitation due to R hip pain or weakness    Status Achieved   05/28/21 - Pt reports she is able to walk 1 mile w/o limitation due to pain (2x her prior level)     PT LONG TERM GOAL #5   Title Patient to report ability to climb stairs without increased R hip pain    Status Achieved   05/28/21                  Plan - 06/06/21 0928     Clinical Impression Statement Sandy is very pleased with her progress with PT noting her hip pain now seems to be fully resolved and she has been able to resume all of her   normal activities including walking for exercise and climbing stairs w/o limitations. She has exceeded her predicted level of improvement on her FOTO of 26 with a 32-point increase in her score to 84. Her overall LE strength is now 4+/5 to 5/5 with only exception being R hip abduction at 4/5. She reports good comfort/confidence with her HEP and we reviewed  the exercises today to help her identify the most relevant exercises to focus on at a maintenance level. All goals met and Lovey Newcomer feels confident with transitioning to her HEP, therefore will proceed with discharge from PT for this episode.    Comorbidities HTN, a-fib, NSVT, near syncope, GERD, diverticulosis, OA, osteoporosis, chronic LBP w/o sciatica, chronic hip pain, Raynaud phenomenon, sleep related leg cramps    Rehab Potential Good    PT Treatment/Interventions ADLs/Self Care Home Management;Cryotherapy;Electrical Stimulation;Iontophoresis 55m/ml Dexamethasone;Moist Heat;Ultrasound;DME Instruction;Gait training;Stair training;Functional mobility training;Therapeutic activities;Therapeutic exercise;Balance training;Neuromuscular re-education;Patient/family education;Manual techniques;Passive range of motion;Dry needling;Taping;Joint Manipulations    PT Next Visit Plan Discharge with transition to HEP    PT Home Exercise Plan Access Code: [email or web address] (10/28, updated 11/4, 11/18, 12/2 & 12/7)    Consulted and Agree with Plan of Care Patient             Patient will benefit from skilled therapeutic intervention in order to improve the following deficits and impairments:  Decreased activity tolerance, Decreased mobility, Decreased range of motion, Decreased strength, Difficulty walking, Increased fascial restricitons, Increased muscle spasms, Impaired perceived functional ability, Impaired flexibility, Improper body mechanics, Postural dysfunction, Pain  Visit Diagnosis: Pain in right hip  Stiffness of right hip, not elsewhere classified  Muscle weakness (generalized)  Other abnormalities of gait and mobility  Difficulty in walking, not elsewhere classified     Problem List Patient Active Problem List   Diagnosis Date Noted   Elevated coronary artery calcium score 04/09/2021   Nonintractable headache 01/14/2021   Allergic drug reaction 03/26/2020   Vitamin D deficiency 12/28/2019    Complex renal cyst 12/28/2019   Elevated liver function tests 12/22/2019   Bug bite 12/22/2019   Persistent atrial fibrillation ([facility] 11/13/2019   Secondary hypercoagulable state ([facility] 11/13/2019   Encounter for loop recorder check 10/20/2019   Long term (current) use of anticoagulants 10/20/2019   Chronic bilateral low back pain without sciatica 09/24/2019   Statin intolerance 09/11/2019   Diarrhea 08/08/2018   Raynaud phenomenon 08/08/2018   Pulsatile tinnitus 04/26/2018   Hyperglycemia 04/26/2018   Cervicalgia 04/26/2018   Near syncope 04/15/2018   NSVT (nonsustained ventricular tachycardia) 04/14/2018   Palpitations 02/25/2018   Bilateral carotid bruits 02/25/2018   Myalgia 04/25/2017   Left shoulder pain 04/23/2017   Plantar wart of left foot 10/15/2016   Leg cramps, sleep related 04/02/2016   Hip pain 05/29/2015   Preventative health care 03/24/2015   Bladder prolapse, female, acquired 09/16/2014   Osteoporosis 06/26/2014   Arthritis    GERD (gastroesophageal reflux disease)    Hypertension    Hyperlipidemia    History of kidney stones    H/O hematuria    Diverticulosis    History of chicken pox    Anxiety and depression    SCC (squamous cell carcinoma)    Bowen's disease      PHYSICAL THERAPY DISCHARGE SUMMARY  Visits from Start of Care: 12  Current functional level related to goals / functional outcomes:   Refer to above clinical impression.   Remaining deficits:   Mild R hip ABD weakness at 4/5.   Education /  Equipment:   HEP   Patient agrees to discharge. Patient goals were met. Patient is being discharged due to meeting the stated rehab goals.  JoAnne M Kreis, PT 06/06/2021, 12:24 PM  [facility] [place] [address]  [address] [place], , [address] Phone: [phone]   Fax:  [phone]  Name: Weslee L Longino MRN: [id number] Date of Birth: 02/21/1941    

## 2021-06-17 LAB — CUP PACEART REMOTE DEVICE CHECK
Date Time Interrogation Session: 20221218002453
Implantable Pulse Generator Implant Date: 20191018

## 2021-06-19 ENCOUNTER — Telehealth: Payer: Self-pay | Admitting: Family Medicine

## 2021-06-19 NOTE — Telephone Encounter (Signed)
Left message for patient to call back and schedule Medicare Annual Wellness Visit (AWV) in office.   If not able to come in office, please offer to do virtually or by telephone.  Left office number and my jabber [phone].  Last AWV:05/04/2019  Please schedule at anytime with Nurse Health Advisor.

## 2021-06-25 ENCOUNTER — Ambulatory Visit (INDEPENDENT_AMBULATORY_CARE_PROVIDER_SITE_OTHER): Payer: Medicare HMO

## 2021-06-25 DIAGNOSIS — R55 Syncope and collapse: Secondary | ICD-10-CM | POA: Diagnosis not present

## 2021-07-04 DIAGNOSIS — N814 Uterovaginal prolapse, unspecified: Secondary | ICD-10-CM | POA: Diagnosis not present

## 2021-07-04 DIAGNOSIS — N8110 Cystocele, unspecified: Secondary | ICD-10-CM | POA: Diagnosis not present

## 2021-07-04 DIAGNOSIS — N8111 Cystocele, midline: Secondary | ICD-10-CM | POA: Diagnosis not present

## 2021-07-04 DIAGNOSIS — Z4689 Encounter for fitting and adjustment of other specified devices: Secondary | ICD-10-CM | POA: Diagnosis not present

## 2021-07-07 NOTE — Progress Notes (Signed)
Carelink Summary Report / Loop Recorder 

## 2021-07-15 ENCOUNTER — Telehealth: Payer: Self-pay | Admitting: Family Medicine

## 2021-07-15 NOTE — Telephone Encounter (Signed)
Patient states she believes to be having a diverticulitis flare up, and the last time this happened Dr. Charlett Blake advised her to reach out as soon as she gets the symptoms so medication can be sent for her. Patient states it started 01/14 and would like the medication before it gets worse. Please advice.    [id number], [address]  [place], [place] [address]  Phone:  [phone]  Fax:  [phone]

## 2021-07-16 ENCOUNTER — Other Ambulatory Visit: Payer: Self-pay | Admitting: Family Medicine

## 2021-07-16 MED ORDER — CIPROFLOXACIN HCL 250 MG PO TABS: 250.0000 mg | ORAL_TABLET | Freq: Two times a day (BID) | ORAL | 0 refills | Status: AC

## 2021-07-16 MED ORDER — METRONIDAZOLE 500 MG PO TABS: 500.0000 mg | ORAL_TABLET | Freq: Three times a day (TID) | ORAL | 0 refills | Status: AC

## 2021-07-16 NOTE — Telephone Encounter (Signed)
Pt aware of medication being sent and instructions

## 2021-07-28 ENCOUNTER — Ambulatory Visit (INDEPENDENT_AMBULATORY_CARE_PROVIDER_SITE_OTHER): Payer: Medicare HMO

## 2021-07-28 DIAGNOSIS — I4819 Other persistent atrial fibrillation: Secondary | ICD-10-CM

## 2021-07-28 LAB — CUP PACEART REMOTE DEVICE CHECK
Date Time Interrogation Session: 20230129232429
Implantable Pulse Generator Implant Date: 20191018

## 2021-08-05 ENCOUNTER — Other Ambulatory Visit: Payer: Self-pay | Admitting: Cardiovascular Disease

## 2021-08-05 NOTE — Progress Notes (Signed)
Carelink Summary Report / Loop Recorder 

## 2021-08-11 ENCOUNTER — Telehealth: Payer: Self-pay | Admitting: Cardiovascular Disease

## 2021-08-11 NOTE — Telephone Encounter (Signed)
°*  STAT* If patient is at the pharmacy, call can be transferred to refill team.   1. Which medications need to be refilled? (please list name of each medication and dose if known)  apixaban (ELIQUIS) 5 MG TABS tablet  2. Which pharmacy/location (including street and city if local pharmacy) is medication to be sent to? [id number] - [place], [place] - [address] AT [address]  3. Do they need a 30 day or 90 day supply? 90  

## 2021-08-12 MED ORDER — APIXABAN 5 MG PO TABS: ORAL_TABLET | ORAL | 1 refills | Status: DC

## 2021-08-12 NOTE — Telephone Encounter (Signed)
Prescription refill request for Eliquis received. Indication: Afib  Last office visit:04/09/21 Gwenlyn Found)  Scr:0.84 (01/14/21)  Age: 81 Weight: 61.7kg  Appropriate dose and refill sent to requested pharmacy.

## 2021-09-01 ENCOUNTER — Ambulatory Visit (INDEPENDENT_AMBULATORY_CARE_PROVIDER_SITE_OTHER): Payer: Medicare HMO

## 2021-09-01 DIAGNOSIS — I480 Paroxysmal atrial fibrillation: Secondary | ICD-10-CM | POA: Diagnosis not present

## 2021-09-01 DIAGNOSIS — Z0000 Encounter for general adult medical examination without abnormal findings: Secondary | ICD-10-CM

## 2021-09-01 LAB — CUP PACEART REMOTE DEVICE CHECK
Date Time Interrogation Session: 20230303232738
Implantable Pulse Generator Implant Date: 20191018

## 2021-09-01 NOTE — Patient Instructions (Signed)
Sophia [name] , Thank you for taking time to come for your Medicare Wellness Visit. I appreciate your ongoing commitment to your health goals. Please review the following plan we discussed and let me know if I can assist you in the future.   Screening recommendations/referrals: Colonoscopy: no longer need Mammogram: no longer needed to device in breast Bone Density: Pt wants pcp to order Recommended yearly ophthalmology/optometry visit for glaucoma screening and checkup Recommended yearly dental visit for hygiene and checkup  Vaccinations: Influenza vaccine: Due-May obtain vaccine at your local pharmacy.  Pneumococcal vaccine: Due-May obtain vaccine at your local pharmacy.  Tdap vaccine: up to date Shingles vaccine: Due-May obtain vaccine at our office or your local pharmacy.    Covid-19:Due-May obtain vaccine at your local pharmacy.   Advanced directives: Yes, will bring at next visit  Conditions/risks identified: see problem list  Next appointment: Follow up in one year for your annual wellness visit    Preventive Care 65 Years and Older, Female Preventive care refers to lifestyle choices and visits with your health care provider that can promote health and wellness. What does preventive care include? A yearly physical exam. This is also called an annual well check. Dental exams once or twice a year. Routine eye exams. Ask your health care provider how often you should have your eyes checked. Personal lifestyle choices, including: Daily care of your teeth and gums. Regular physical activity. Eating a healthy diet. Avoiding tobacco and drug use. Limiting alcohol use. Practicing safe sex. Taking low-dose aspirin every day. Taking vitamin and mineral supplements as recommended by your health care provider. What happens during an annual well check? The services and screenings done by your health care provider during your annual well check will depend on your age, overall health,  lifestyle risk factors, and family history of disease. Counseling  Your health care provider may ask you questions about your: Alcohol use. Tobacco use. Drug use. Emotional well-being. Home and relationship well-being. Sexual activity. Eating habits. History of falls. Memory and ability to understand (cognition). Work and work Statistician. Reproductive health. Screening  You may have the following tests or measurements: Height, weight, and BMI. Blood pressure. Lipid and cholesterol levels. These may be checked every 5 years, or more frequently if you are over 80 years old. Skin check. Lung cancer screening. You may have this screening every year starting at age 80 if you have a 30-pack-year history of smoking and currently smoke or have quit within the past 15 years. Fecal occult blood test (FOBT) of the stool. You may have this test every year starting at age 80. Flexible sigmoidoscopy or colonoscopy. You may have a sigmoidoscopy every 5 years or a colonoscopy every 10 years starting at age 80. Hepatitis C blood test. Hepatitis B blood test. Sexually transmitted disease (STD) testing. Diabetes screening. This is done by checking your blood sugar (glucose) after you have not eaten for a while (fasting). You may have this done every 1-3 years. Bone density scan. This is done to screen for osteoporosis. You may have this done starting at age 80. Mammogram. This may be done every 1-2 years. Talk to your health care provider about how often you should have regular mammograms. Talk with your health care provider about your test results, treatment options, and if necessary, the need for more tests. Vaccines  Your health care provider may recommend certain vaccines, such as: Influenza vaccine. This is recommended every year. Tetanus, diphtheria, and acellular pertussis (Tdap, Td) vaccine. You  may need a Td booster every 10 years. Zoster vaccine. You may need this after age  80. Pneumococcal 13-valent conjugate (PCV13) vaccine. One dose is recommended after age 27. Pneumococcal polysaccharide (PPSV23) vaccine. One dose is recommended after age 80. Talk to your health care provider about which screenings and vaccines you need and how often you need them. This information is not intended to replace advice given to you by your health care provider. Make sure you discuss any questions you have with your health care provider. Document Released: 07/12/2015 Document Revised: 03/04/2016 Document Reviewed: 04/16/2015 Elsevier Interactive Patient Education  2017 [place] Prevention in the Home Falls can cause injuries. They can happen to people of all ages. There are many things you can do to make your home safe and to help prevent falls. What can I do on the outside of my home? Regularly fix the edges of walkways and driveways and fix any cracks. Remove anything that might make you trip as you walk through a door, such as a raised step or threshold. Trim any bushes or trees on the path to your home. Use bright outdoor lighting. Clear any walking paths of anything that might make someone trip, such as rocks or tools. Regularly check to see if handrails are loose or broken. Make sure that both sides of any steps have handrails. Any raised decks and porches should have guardrails on the edges. Have any leaves, snow, or ice cleared regularly. Use sand or salt on walking paths during winter. Clean up any spills in your garage right away. This includes oil or grease spills. What can I do in the bathroom? Use night lights. Install grab bars by the toilet and in the tub and shower. Do not use towel bars as grab bars. Use non-skid mats or decals in the tub or shower. If you need to sit down in the shower, use a plastic, non-slip stool. Keep the floor dry. Clean up any water that spills on the floor as soon as it happens. Remove soap buildup in the tub or shower  regularly. Attach bath mats securely with double-sided non-slip rug tape. Do not have throw rugs and other things on the floor that can make you trip. What can I do in the bedroom? Use night lights. Make sure that you have a light by your bed that is easy to reach. Do not use any sheets or blankets that are too big for your bed. They should not hang down onto the floor. Have a firm chair that has side arms. You can use this for support while you get dressed. Do not have throw rugs and other things on the floor that can make you trip. What can I do in the kitchen? Clean up any spills right away. Avoid walking on wet floors. Keep items that you use a lot in easy-to-reach places. If you need to reach something above you, use a strong step stool that has a grab bar. Keep electrical cords out of the way. Do not use floor polish or wax that makes floors slippery. If you must use wax, use non-skid floor wax. Do not have throw rugs and other things on the floor that can make you trip. What can I do with my stairs? Do not leave any items on the stairs. Make sure that there are handrails on both sides of the stairs and use them. Fix handrails that are broken or loose. Make sure that handrails are as long as the  stairways. Check any carpeting to make sure that it is firmly attached to the stairs. Fix any carpet that is loose or worn. Avoid having throw rugs at the top or bottom of the stairs. If you do have throw rugs, attach them to the floor with carpet tape. Make sure that you have a light switch at the top of the stairs and the bottom of the stairs. If you do not have them, ask someone to add them for you. What else can I do to help prevent falls? Wear shoes that: Do not have high heels. Have rubber bottoms. Are comfortable and fit you well. Are closed at the toe. Do not wear sandals. If you use a stepladder: Make sure that it is fully opened. Do not climb a closed stepladder. Make sure that  both sides of the stepladder are locked into place. Ask someone to hold it for you, if possible. Clearly mark and make sure that you can see: Any grab bars or handrails. First and last steps. Where the edge of each step is. Use tools that help you move around (mobility aids) if they are needed. These include: Canes. Walkers. Scooters. Crutches. Turn on the lights when you go into a dark area. Replace any light bulbs as soon as they burn out. Set up your furniture so you have a clear path. Avoid moving your furniture around. If any of your floors are uneven, fix them. If there are any pets around you, be aware of where they are. Review your medicines with your doctor. Some medicines can make you feel dizzy. This can increase your chance of falling. Ask your doctor what other things that you can do to help prevent falls. This information is not intended to replace advice given to you by your health care provider. Make sure you discuss any questions you have with your health care provider. Document Released: 04/11/2009 Document Revised: 11/21/2015 Document Reviewed: 07/20/2014 Elsevier Interactive Patient Education  2017 [employer].

## 2021-09-01 NOTE — Progress Notes (Signed)
Subjective:   Sophia [name] is a 81 y.o. female who presents for an Initial Medicare Annual Wellness Visit.  I connected with  Sophia [name] on 09/01/21 by a audio enabled telemedicine application and verified that I am speaking with the correct person using two identifiers.  Patient Location: Home  Provider Location: Office/Clinic  I discussed the limitations of evaluation and management by telemedicine. The patient expressed understanding and agreed to proceed.   Review of Systems     Cardiac Risk Factors include: advanced age (>7mn, >>52women);hypertension;dyslipidemia     Objective:    There were no vitals filed for this visit. There is no height or weight on file to calculate BMI.  Advanced Directives 09/01/2021 09/01/2021 04/25/2021 01/29/2021 05/04/2019 02/22/2018 12/23/2017  Does Patient Have a Medical Advance Directive? Yes Yes Yes Yes Yes Yes Yes  Type of AParamedicof AAlvoOut of facility DNR (pink MOST or yellow form) HThurmanLiving will HSierraLiving will HNassau Village-RatliffLiving will HNew MarketLiving will HBrainardsLiving will HAgesLiving will  Does patient want to make changes to medical advance directive? - - - - No - Patient declined - No - Patient declined  Copy of HYavapaiin Chart? No - copy requested No - copy requested Yes - validated most recent copy scanned in chart (See row information) Yes - validated most recent copy scanned in chart (See row information) No - copy requested - No - copy requested    Current Medications (verified) Outpatient Encounter Medications as of 09/01/2021  Medication Sig   acetaminophen (TYLENOL) 500 MG tablet Take 1,000 mg by mouth 2 (two) times daily as needed for moderate pain or headache.   apixaban (ELIQUIS) 5 MG TABS tablet TAKE 1 TABLET(5 MG) BY MOUTH TWICE DAILY    Calcium Carb-Cholecalciferol (CALCIUM 600 + D PO) Take 1 tablet by mouth daily.   carvedilol (COREG) 25 MG tablet TAKE 1 TABLET(25 MG) BY MOUTH TWICE DAILY WITH A MEAL   Cholecalciferol (VITAMIN D) 50 MCG (2000 UT) tablet Take 1 tablet (2,000 Units total) by mouth daily.   diltiazem (CARDIZEM CD) 240 MG 24 hr capsule TAKE 1 CAPSULE(240 MG) BY MOUTH DAILY   estradiol (ESTRACE) 0.1 MG/GM vaginal cream Place 1 Applicatorful vaginally 2 (two) times a week.   famotidine (PEPCID) 20 MG tablet Take 1 tablet (20 mg total) by mouth 2 (two) times daily as needed for heartburn or indigestion. Pt takes before bed daily   Hypromellose (ARTIFICIAL TEARS OP) Place 1 drop into both eyes 2 (two) times daily.   Probiotic Product (PROBIOTIC DAILY PO) Take 1 capsule by mouth daily.    REPATHA SURECLICK 1509MG/ML SOAJ INJECT 1 DOSE INTO THE SKIN EVERY 14 DAYS   baclofen (LIORESAL) 10 MG tablet Take 0.5-1 tablets (5-10 mg total) by mouth at bedtime as needed for muscle spasms. (Patient not taking: Reported on 04/25/2021)   Co-Enzyme Q10 200 MG CAPS Take 200 mg by mouth daily.   No facility-administered encounter medications on file as of 09/01/2021.    Allergies (verified) Morphine and related, Statins, and Sulfa antibiotics   History: Past Medical History:  Diagnosis Date   Anxiety and depression    Arthritis    hands, knees, etc   Bladder prolapse, female, acquired 09/16/2014   Bowen's disease    Diverticulitis    Diverticulosis    GERD (gastroesophageal reflux disease)  H/O hematuria    for several years, work only revealed kidney stone on right side   History of chicken pox    History of kidney stones    Hyperlipidemia    Hypertension    Leg cramps, sleep related 04/02/2016   Myalgia 04/25/2017   Osteopenia 06/26/2014   Osteoporosis 06/26/2014   Plantar wart of [name] foot 10/15/2016   Right shoulder pain 04/23/2017   SCC (squamous cell carcinoma)    arms   Statin intolerance 09/11/2019   Past  Surgical History:  Procedure Laterality Date   CHOLECYSTECTOMY  2005   LOOP RECORDER INSERTION N/A 04/15/2018   Procedure: LOOP RECORDER INSERTION;  Surgeon: Sanda Klein, MD;  Location: [facility];  Service: Cardiovascular;  Laterality: N/A;   SKIN BIOPSY     multiple   Family History  Problem Relation Age of Onset   Heart disease Mother        aortic stenosis   Hyperlipidemia Mother    Hypertension Mother    Heart attack Mother    Cancer Mother        BCC, SCC, skin   Hypertension Father    Cancer Father        SCC, BCC, skin   Stroke Brother    Early death Daughter    Heart disease Maternal Grandfather        MI   Tuberculosis Paternal Grandmother    Cancer Paternal Grandfather        head and neck cancer, chewed tobacco   Hypertension Daughter    Heart disease Brother        arrythmia   Hypertension Brother    Hyperlipidemia Brother    Birth defects Maternal Uncle    Social History   Socioeconomic History   Marital status: Single    Spouse name: Not on file   Number of children: Not on file   Years of education: Not on file   Highest education level: Not on file  Occupational History   Not on file  Tobacco Use   Smoking status: Never   Smokeless tobacco: Never  Vaping Use   Vaping Use: Never used  Substance and Sexual Activity   Alcohol use: No    Alcohol/week: 0.0 standard drinks   Drug use: No   Sexual activity: Never    Comment: divorced, widowed, lives with elderly parents, is the caregiver, no dietary restrictions.   Other Topics Concern   Not on file  Social History Narrative   Not on file   Social Determinants of Health   Financial Resource Strain: Low Risk    Difficulty of Paying Living Expenses: Not hard at all  Food Insecurity: No Food Insecurity   Worried About Charity fundraiser in the Last Year: Never true   [place] in the Last Year: Never true  Transportation Needs: No Transportation Needs   Lack of  Transportation (Medical): No   Lack of Transportation (Non-Medical): No  Physical Activity: Sufficiently Active   Days of Exercise per Week: 7 days   Minutes of Exercise per Session: 60 min  Stress: No Stress Concern Present   Feeling of Stress : Not at all  Social Connections: Moderately Integrated   Frequency of Communication with Friends and Family: More than three times a week   Frequency of Social Gatherings with Friends and Family: More than three times a week   Attends Religious Services: More than 4 times per year   Active  Member of Clubs or Organizations: Yes   Attends Music therapist: More than 4 times per year   Marital Status: Widowed    Tobacco Counseling Counseling given: Not Answered   Clinical Intake:  Pre-visit preparation completed: Yes  Pain : No/denies pain     Nutritional Risks: None Diabetes: No  How often do you need to have someone help you when you read instructions, pamphlets, or other written materials from your doctor or pharmacy?: 1 - Never  Diabetic?No  Interpreter Needed?: No  Information entered by :: [place] of Daily Living In your present state of health, do you have any difficulty performing the following activities: 09/01/2021 01/14/2021  Hearing? N N  Vision? N N  Difficulty concentrating or making decisions? N N  Walking or climbing stairs? N N  Dressing or bathing? N N  Doing errands, shopping? N N  Preparing Food and eating ? N -  Using the Toilet? N -  In the past six months, have you accidently leaked urine? N -  Do you have problems with loss of bowel control? N -  Managing your Medications? N -  Managing your Finances? N -  Housekeeping or managing your Housekeeping? N -  Some recent data might be hidden    Patient Care Team: Mosie Lukes, MD as PCP - General (Family Medicine) Lorretta Harp, MD as PCP - Cardiology (Cardiology) Azucena Fallen, MD as Consulting Physician  (Obstetrics and Gynecology) Rolm Bookbinder, MD as Consulting Physician (Dermatology) Katy Apo, MD as Consulting Physician (Ophthalmology)  Indicate any recent Medical Services you may have received from other than [facility] providers in the past year (date may be approximate).     Assessment:   This is a routine wellness examination for Jessicamarie.  Hearing/Vision screen No results found.  Dietary issues and exercise activities discussed: Exercise limited by: None identified   Goals Addressed               This Visit's Progress     Lose 5lbs--current wt is 140lb (pt-stated)   Not on track     Maintain current healthy active life.   On track     Schedule dermatologist and foot doctor. (pt-stated)   On track     Schedule eye and dental exams.   (pt-stated)   On track      Depression Screen PHQ 2/9 Scores 09/01/2021 01/14/2021 05/04/2019 04/26/2018 12/23/2017 12/15/2016 04/02/2016  PHQ - 2 Score 0 0 0 0 0 0 0    Fall Risk Fall Risk  09/01/2021 09/12/2020 05/04/2019 04/26/2018 12/23/2017  Falls in the past year? 0 0 0 No No  Number falls in past yr: 0 0 0 - -  Injury with Fall? 0 0 0 - -  Risk for fall due to : No Fall Risks - - - -  Follow up Falls evaluation completed - - - -    FALL RISK PREVENTION PERTAINING TO THE HOME:  Any stairs in or around the home? Yes  If so, are there any without handrails? No  Home free of loose throw rugs in walkways, pet beds, electrical cords, etc? Yes  Adequate lighting in your home to reduce risk of falls? Yes   ASSISTIVE DEVICES UTILIZED TO PREVENT FALLS:  Life alert? No  Use of a cane, walker or w/c? No  Grab bars in the bathroom? No  Shower chair or bench in shower? No  Elevated toilet seat or a handicapped  toilet? Yes   TIMED UP AND GO:  Was the test performed? No .    Cognitive Function:Normal cognitive status assessed by direct observation by this Nurse Health Advisor. No abnormalities found.    MMSE - Mini Mental State Exam  12/15/2016 06/13/2015  Orientation to time 5 5  Orientation to Place 5 5  Registration 3 3  Attention/ Calculation 5 5  Recall 3 3  Language- name 2 objects 2 2  Language- repeat 1 1  Language- follow 3 step command 3 3  Language- read & follow direction 1 1  Write a sentence 1 1  Copy design 1 1  Total score 30 30        Immunizations Immunization History  Administered Date(s) Administered   Fluad Quad(high Dose 65+) 04/12/2019   Influenza, High Dose Seasonal PF 03/26/2017, 04/22/2018   Influenza,inj,Quad PF,6+ Mos 03/19/2015   Influenza-Unspecified 04/29/2014   PFIZER(Purple Top)SARS-COV-2 Vaccination 03/03/2020, 03/19/2020   Tdap 12/10/2016    TDAP status: Up to date  Flu Vaccine status: Due, Education has been provided regarding the importance of this vaccine. Advised may receive this vaccine at local pharmacy or Health Dept. Aware to provide a copy of the vaccination record if obtained from local pharmacy or Health Dept. Verbalized acceptance and understanding.  Pneumococcal vaccine status: Due, Education has been provided regarding the importance of this vaccine. Advised may receive this vaccine at local pharmacy or Health Dept. Aware to provide a copy of the vaccination record if obtained from local pharmacy or Health Dept. Verbalized acceptance and understanding.  Covid-19 vaccine status: Declined, Education has been provided regarding the importance of this vaccine but patient still declined. Advised may receive this vaccine at local pharmacy or Health Dept.or vaccine clinic. Aware to provide a copy of the vaccination record if obtained from local pharmacy or Health Dept. Verbalized acceptance and understanding. Will not get anymore due having a reaction  Qualifies for Shingles Vaccine? Yes   Zostavax completed No   Shingrix Completed?: No.    Education has been provided regarding the importance of this vaccine. Patient has been advised to call insurance company to  determine out of pocket expense if they have not yet received this vaccine. Advised may also receive vaccine at local pharmacy or Health Dept. Verbalized acceptance and understanding.  Screening Tests Health Maintenance  Topic Date Due   Hepatitis C Screening  Never done   Zoster Vaccines- Shingrix (1 of 2) Never done   Pneumonia Vaccine 50+ Years old (1 - PCV) Never done   COVID-19 Vaccine (3 - Mixed Product risk series) 04/16/2020   INFLUENZA VACCINE  01/27/2021   TETANUS/TDAP  12/11/2026   DEXA SCAN  Completed   HPV VACCINES  Aged Out    Health Maintenance  Health Maintenance Due  Topic Date Due   Hepatitis C Screening  Never done   Zoster Vaccines- Shingrix (1 of 2) Never done   Pneumonia Vaccine 31+ Years old (1 - PCV) Never done   COVID-19 Vaccine (3 - Mixed Product risk series) 04/16/2020   INFLUENZA VACCINE  01/27/2021    Colorectal cancer screening: No longer required.   Mammogram status: No longer required due to device in breast.  Bone Density status: Ordered Per pt will have pcp order for her. Pt provided with contact info and advised to call to schedule appt.  Lung Cancer Screening: (Low Dose CT Chest recommended if Age 12-80 years, 30 pack-year currently smoking OR have quit w/in 15years.) does not  qualify.   Lung Cancer Screening Referral: N/A  Additional Screening:  Hepatitis C Screening: does qualify; Completed N/A  Vision Screening: Recommended annual ophthalmology exams for early detection of glaucoma and other disorders of the eye. Is the patient up to date with their annual eye exam?   Scheduled Who is the provider or what is the name of the office in which the patient attends annual eye exams? [facility]. If pt is not established with a provider, would they like to be referred to a provider to establish care? No .   Dental Screening: Recommended annual dental exams for proper oral hygiene  Community Resource Referral / Chronic Care  Management: CRR required this visit?  No   CCM required this visit?  No      Plan:     I have personally reviewed and noted the following in the patients chart:   Medical and social history Use of alcohol, tobacco or illicit drugs  Current medications and supplements including opioid prescriptions. Patient is not currently taking opioid prescriptions. Functional ability and status Nutritional status Physical activity Advanced directives List of other physicians Hospitalizations, surgeries, and ER visits in previous 12 months Vitals Screenings to include cognitive, depression, and falls Referrals and appointments  In addition, I have reviewed and discussed with patient certain preventive protocols, quality metrics, and best practice recommendations. A written personalized care plan for preventive services as well as general preventive health recommendations were provided to patient.   Due to this being a telephonic visit, the after visit summary with patients personalized plan was offered to patient via mail or my-chart. Patient declined at this time.   Duard Brady Beverely Suen, CMA   09/01/2021   Nurse Notes: None

## 2021-09-11 NOTE — Progress Notes (Signed)
Carelink Summary Report / Loop Recorder 

## 2021-09-24 DIAGNOSIS — E782 Mixed hyperlipidemia: Secondary | ICD-10-CM | POA: Diagnosis not present

## 2021-09-25 LAB — LIPID PANEL
Chol/HDL Ratio: 2.8 ratio (ref 0.0–4.4)
Cholesterol, Total: 146 mg/dL (ref 100–199)
HDL: 53 mg/dL (ref >39)
LDL Chol Calc (NIH): 63 mg/dL (ref 0–99)
Triglycerides: 180 mg/dL — ABNORMAL HIGH (ref 0–149)
VLDL Cholesterol Cal: 30 mg/dL (ref 5–40)

## 2021-10-01 ENCOUNTER — Ambulatory Visit: Payer: Medicare HMO | Admitting: Internal Medicine

## 2021-10-01 ENCOUNTER — Encounter: Payer: Self-pay | Admitting: Internal Medicine

## 2021-10-01 VITALS — BP 136/72 | HR 65 | Ht 61.0 in | Wt 138.2 lb

## 2021-10-01 DIAGNOSIS — I700 Atherosclerosis of aorta: Secondary | ICD-10-CM | POA: Diagnosis not present

## 2021-10-01 DIAGNOSIS — E785 Hyperlipidemia, unspecified: Secondary | ICD-10-CM | POA: Diagnosis not present

## 2021-10-01 DIAGNOSIS — R931 Abnormal findings on diagnostic imaging of heart and coronary circulation: Secondary | ICD-10-CM | POA: Diagnosis not present

## 2021-10-01 DIAGNOSIS — T466X5D Adverse effect of antihyperlipidemic and antiarteriosclerotic drugs, subsequent encounter: Secondary | ICD-10-CM

## 2021-10-01 DIAGNOSIS — T466X5A Adverse effect of antihyperlipidemic and antiarteriosclerotic drugs, initial encounter: Secondary | ICD-10-CM

## 2021-10-01 DIAGNOSIS — M791 Myalgia, unspecified site: Secondary | ICD-10-CM

## 2021-10-01 NOTE — Patient Instructions (Signed)
Medication Instructions:  ?NO CHANGES ? ?*If you need a refill on your cardiac medications before your next appointment, please call your pharmacy* ? ? ?Lab Work: ?FASTING lipid panel in 1 year  ? ?If you have labs (blood work) drawn today and your tests are completely normal, you will receive your results only by: ?MyChart Message (if you have MyChart) OR ?A paper copy in the mail ?If you have any lab test that is abnormal or we need to change your treatment, we will call you to review the results. ? ? ?Testing/Procedures: ?NONE ? ? ?Follow-Up: ?At [facility], you and your health needs are our priority.  As part of our continuing mission to provide you with exceptional heart care, we have created designated Provider Care Teams.  These Care Teams include your primary Cardiologist (physician) and Advanced Practice Providers (APPs -  Physician Assistants and Nurse Practitioners) who all work together to provide you with the care you need, when you need it. ? ?We recommend signing up for the patient portal called "MyChart".  Sign up information is provided on this After Visit Summary.  MyChart is used to connect with patients for Virtual Visits (Telemedicine).  Patients are able to view lab/test results, encounter notes, upcoming appointments, etc.  Non-urgent messages can be sent to your provider as well.   ?To learn more about what you can do with MyChart, go to [email or web address].   ? ?Your next appointment:   ?12 month(s) ? ?The format for your next appointment:   ?In Person ? ?Provider:   ?Dr. Debara Pickett -- lipid clinic ? ?

## 2021-10-01 NOTE — Progress Notes (Signed)
? ? ?LIPID CLINIC CONSULT NOTE ? ?Chief Complaint:  ?Follow-up dyslipidemia ? ?Primary Care Physician: ?Mosie Lukes, MD ? ?Primary Cardiologist:  ?Quay Burow, MD ? ?HPI:  ?Sophia [name] is a 81 y.o. female who is being seen today for the evaluation of dyslipidemia at the request of Mosie Lukes, MD. This is a 81 year old female with a history of dyslipidemia and atrial fibrillation on Eliquis who has been to statins including having had myalgias and muscle weakness on atorvastatin right, red yeast rice, rosuvastatin and pravastatin in the past.  Although there are cardiovascular risk factors and family history of heart disease, she has no known coronary disease.  She was noted to have aortic atherosclerosis on a recent CT renal stone study performed in June 2021.  I suspect there is also coronary artery disease as well. ? ?07/24/2020 ? ?Sophia [name] returns today for follow-up.  I last saw her via virtual visit.  We had arranged for her to start on Repatha.  She has been using that with no significant side effects.  Please report her lipids are markedly improved.  Her total cholesterol now is under 43 with HDL 54, LDL 61 and triglycerides 171.  A1c is normal at 5.7.  She did mention today that recently her blood pressure has been going up.  In the office today her blood pressure was 142/100.  I reviewed some home blood pressure readings which indicate systolics between 115 and 726 with diastolics in the 20B and 55H.  Heart rate is generally in the 60s but was elevated at 78 today.  She had an appointment with Dr. Alvester Chou to review this however it was unfortunately canceled and rescheduled in March. ? ?10/01/2021 ? ?Sophia [name] is seen today in follow-up.  Is been a year since we last saw her.  She continues to do well on Repatha with no side effects.  Her lipids are essentially unchanged with total cholesterol 146, triglycerides mildly elevated at 180, HDL 53 and LDL 63.  She denies any cardiac symptoms  such as chest pain or worsening shortness of breath.  No issues with her pacer and she continues to get remote checks.  She has follow-up with Dr. Gwenlyn Found in October. ? ?PMHx:  ?Past Medical History:  ?Diagnosis Date  ? Anxiety and depression   ? Arthritis   ? hands, knees, etc  ? Bladder prolapse, female, acquired 09/16/2014  ? Bowen's disease   ? Diverticulitis   ? Diverticulosis   ? GERD (gastroesophageal reflux disease)   ? H/O hematuria   ? for several years, work only revealed kidney stone on right side  ? History of chicken pox   ? History of kidney stones   ? Hyperlipidemia   ? Hypertension   ? Leg cramps, sleep related 04/02/2016  ? Myalgia 04/25/2017  ? Osteopenia 06/26/2014  ? Osteoporosis 06/26/2014  ? Plantar wart of left foot 10/15/2016  ? Right shoulder pain 04/23/2017  ? SCC (squamous cell carcinoma)   ? arms  ? Statin intolerance 09/11/2019  ? ? ?Past Surgical History:  ?Procedure Laterality Date  ? CHOLECYSTECTOMY  2005  ? LOOP RECORDER INSERTION N/A 04/15/2018  ? Procedure: LOOP RECORDER INSERTION;  Surgeon: Sanda Klein, MD;  Location: [facility];  Service: Cardiovascular;  Laterality: N/A;  ? SKIN BIOPSY    ? multiple  ? ? ?FAMHx:  ?Family History  ?Problem Relation Age of Onset  ? Heart disease Mother   ?  aortic stenosis  ? Hyperlipidemia Mother   ? Hypertension Mother   ? Heart attack Mother   ? Cancer Mother   ?     BCC, SCC, skin  ? Hypertension Father   ? Cancer Father   ?     SCC, BCC, skin  ? Stroke Brother   ? Early death Daughter   ? Heart disease Maternal Grandfather   ?     MI  ? Tuberculosis Paternal Grandmother   ? Cancer Paternal Grandfather   ?     head and neck cancer, chewed tobacco  ? Hypertension Daughter   ? Heart disease Brother   ?     arrythmia  ? Hypertension Brother   ? Hyperlipidemia Brother   ? Birth defects Maternal Uncle   ? ? ?SOCHx:  ? reports that she has never smoked. She has never used smokeless tobacco. She reports that she does not drink alcohol and  does not use drugs. ? ?ALLERGIES:  ?Allergies  ?Allergen Reactions  ? Morphine And Related Swelling  ? Statins Other (See Comments)  ?  Myalgias, weakness (atorvastatin, RYR, rosuvastatin, pravastatin)  ? Sulfa Antibiotics Swelling and Rash  ? ? ?ROS: ?Pertinent items noted in HPI and remainder of comprehensive ROS otherwise negative. ? ?HOME MEDS: ?Current Outpatient Medications on File Prior to Visit  ?Medication Sig Dispense Refill  ? acetaminophen (TYLENOL) 500 MG tablet Take 1,000 mg by mouth 2 (two) times daily as needed for moderate pain or headache.    ? apixaban (ELIQUIS) 5 MG TABS tablet TAKE 1 TABLET(5 MG) BY MOUTH TWICE DAILY 180 tablet 1  ? Calcium Carb-Cholecalciferol (CALCIUM 600 + D PO) Take 1 tablet by mouth daily.    ? carvedilol (COREG) 25 MG tablet TAKE 1 TABLET(25 MG) BY MOUTH TWICE DAILY WITH A MEAL 180 tablet 1  ? Cholecalciferol (VITAMIN D) 50 MCG (2000 UT) tablet Take 1 tablet (2,000 Units total) by mouth daily.    ? Co-Enzyme Q10 200 MG CAPS Take 200 mg by mouth daily.    ? diltiazem (CARDIZEM CD) 240 MG 24 hr capsule TAKE 1 CAPSULE(240 MG) BY MOUTH DAILY 90 capsule 3  ? estradiol (ESTRACE) 0.1 MG/GM vaginal cream Place 1 Applicatorful vaginally 2 (two) times a week.    ? famotidine (PEPCID) 20 MG tablet Take 1 tablet (20 mg total) by mouth 2 (two) times daily as needed for heartburn or indigestion. Pt takes before bed daily 180 tablet 0  ? Hypromellose (ARTIFICIAL TEARS OP) Place 1 drop into both eyes 2 (two) times daily.    ? Probiotic Product (PROBIOTIC DAILY PO) Take 1 capsule by mouth daily.     ? REPATHA SURECLICK 280 MG/ML SOAJ INJECT 1 DOSE INTO THE SKIN EVERY 14 DAYS 2 mL 11  ? baclofen (LIORESAL) 10 MG tablet Take 0.5-1 tablets (5-10 mg total) by mouth at bedtime as needed for muscle spasms. (Patient not taking: Reported on 04/25/2021) 30 each 1  ? ?No current facility-administered medications on file prior to visit.  ? ? ?LABS/IMAGING: ?No results found for this or any previous  visit (from the past 48 hour(s)). ?No results found. ? ?LIPID PANEL: ?   ?Component Value Date/Time  ? CHOL 146 09/24/2021 0815  ? TRIG 180 (H) 09/24/2021 0815  ? HDL 53 09/24/2021 0815  ? CHOLHDL 2.8 09/24/2021 0815  ? CHOLHDL 3 01/14/2021 1216  ? VLDL 38.2 01/14/2021 1216  ? [place] 63 09/24/2021 0815  ? [place] 84 04/02/2020 1245  ?  LDLDIRECT 137.0 12/25/2019 1331  ? ? ?WEIGHTS: ?Wt Readings from Last 3 Encounters:  ?10/01/21 138 lb 3.2 oz (62.7 kg)  ?04/17/21 136 lb (61.7 kg)  ?04/09/21 137 lb 3.2 oz (62.2 kg)  ? ? ?VITALS: ?BP 136/72   Pulse 65   Ht '5\' 1"'$  (1.549 m)   Wt 138 lb 3.2 oz (62.7 kg)   SpO2 98%   BMI 26.11 kg/m?  ? ?EXAM: ?Deferred ? ?EKG: ?Deferred ? ?ASSESSMENT: ?Mixed dyslipidemia ?Statin intolerant-myalgias ?Aortic atherosclerosis ?Family history of coronary disease ?Uncontrolled hypertension ?Afib ? ?PLAN: ?1.   Ms. Devora continues to do well on Repatha without hardly any change in her lipids compared to last year.  Triglycerides remain a little bit elevated which she is worked on with diet.  She is asymptomatic denying chest pain or worsening shortness of breath.  We will plan repeat follow-up with lipids in a year or sooner if necessary. ? ?Pixie Casino, MD, [facility], FACP  ?[place]  ?Medical Director of the Advanced Lipid Disorders &  ?Cardiovascular Risk Reduction Clinic ?Diplomate of the [employer] of Clinical Lipidology ?Attending Cardiologist  ?Direct Dial: [phone]  Fax: [phone]  ?Website:  [email or web address] ? ?Nadean Corwin Milon Dethloff ?10/01/2021, 8:51 AM ?

## 2021-10-06 ENCOUNTER — Ambulatory Visit (INDEPENDENT_AMBULATORY_CARE_PROVIDER_SITE_OTHER): Payer: Medicare HMO

## 2021-10-06 DIAGNOSIS — I4819 Other persistent atrial fibrillation: Secondary | ICD-10-CM | POA: Diagnosis not present

## 2021-10-07 LAB — CUP PACEART REMOTE DEVICE CHECK
Date Time Interrogation Session: 20230406003740
Implantable Pulse Generator Implant Date: 20191018

## 2021-10-13 ENCOUNTER — Telehealth: Payer: Self-pay | Admitting: Cardiovascular Disease

## 2021-10-13 NOTE — Telephone Encounter (Signed)
Spoke with pt, she reports she felt the worse on Friday 10/10/21, but she states she felt strange all week. Today, this morning she feels better. Will forward this message to the device clinic for them to review any tracings from her loop recorder. ?

## 2021-10-13 NOTE — Telephone Encounter (Signed)
Patient c/o Palpitations:  High priority if patient c/o lightheadedness, shortness of breath, or chest pain ? ?How long have you had palpitations/irregular HR/ Afib? About a week ? ?Are you having the symptoms now? No. Patient said she feels good today  ? ?Are you currently experiencing lightheadedness, SOB or CP? no ? ?Do you have a history of afib (atrial fibrillation) or irregular heart rhythm? yes ? ?Have you checked your BP or HR? (document readings if available):  ? ?Are you experiencing any other symptoms? Patient had short spell of cold sweats, minor nausea  ? ?Patient said she had a spell last week when she just "felt weird". She feels better today. She wanted to know if anything abnormal showed up on her loop recorder . Her kids urged her to call  ?

## 2021-10-14 NOTE — Telephone Encounter (Signed)
Reviewed Pt's overnight transmission from her loop recorder. ? ?No episodes recorded. ? ?Last recorded episode is from 02/2021. ?Please also not parameters. ? ? ? ?

## 2021-10-14 NOTE — Telephone Encounter (Signed)
Please let her know that while she may have had some brief episodes of atrial fibrillation, she has not had a meaningful sustained event since last September.  I do not think her recent problems with feeling unwell are related to a rhythm abnormality. ?On average, she has only had a couple of episodes of sustained atrial fibrillation per year since we implanted the device. ? ?

## 2021-10-14 NOTE — Telephone Encounter (Signed)
Patient has been made aware. She stated that she feels better now. Nothing further needed.  ?

## 2021-10-20 DIAGNOSIS — H52203 Unspecified astigmatism, bilateral: Secondary | ICD-10-CM | POA: Diagnosis not present

## 2021-10-20 DIAGNOSIS — H524 Presbyopia: Secondary | ICD-10-CM | POA: Diagnosis not present

## 2021-10-20 DIAGNOSIS — H2513 Age-related nuclear cataract, bilateral: Secondary | ICD-10-CM | POA: Diagnosis not present

## 2021-10-21 NOTE — Progress Notes (Signed)
Carelink Summary Report / Loop Recorder 

## 2021-10-22 DIAGNOSIS — D485 Neoplasm of uncertain behavior of skin: Secondary | ICD-10-CM | POA: Diagnosis not present

## 2021-10-22 DIAGNOSIS — D0461 Carcinoma in situ of skin of right upper limb, including shoulder: Secondary | ICD-10-CM | POA: Diagnosis not present

## 2021-10-22 DIAGNOSIS — D0439 Carcinoma in situ of skin of other parts of face: Secondary | ICD-10-CM | POA: Diagnosis not present

## 2021-10-22 DIAGNOSIS — L570 Actinic keratosis: Secondary | ICD-10-CM | POA: Diagnosis not present

## 2021-10-22 DIAGNOSIS — D225 Melanocytic nevi of trunk: Secondary | ICD-10-CM | POA: Diagnosis not present

## 2021-10-22 DIAGNOSIS — L821 Other seborrheic keratosis: Secondary | ICD-10-CM | POA: Diagnosis not present

## 2021-10-22 DIAGNOSIS — Z85828 Personal history of other malignant neoplasm of skin: Secondary | ICD-10-CM | POA: Diagnosis not present

## 2021-10-22 DIAGNOSIS — D1801 Hemangioma of skin and subcutaneous tissue: Secondary | ICD-10-CM | POA: Diagnosis not present

## 2021-11-04 ENCOUNTER — Ambulatory Visit (INDEPENDENT_AMBULATORY_CARE_PROVIDER_SITE_OTHER): Payer: Medicare HMO | Admitting: Family

## 2021-11-04 VITALS — BP 149/84 | HR 73 | Temp 98.9°F | Resp 16 | Ht 61.0 in | Wt 136.6 lb

## 2021-11-04 DIAGNOSIS — R350 Frequency of micturition: Secondary | ICD-10-CM | POA: Diagnosis not present

## 2021-11-04 DIAGNOSIS — R1012 Left upper quadrant pain: Secondary | ICD-10-CM

## 2021-11-04 LAB — COMPREHENSIVE METABOLIC PANEL
ALT: 27 U/L (ref 0–35)
AST: 40 U/L — ABNORMAL HIGH (ref 0–37)
Albumin: 4 g/dL (ref 3.5–5.2)
Alkaline Phosphatase: 95 U/L (ref 39–117)
BUN: 7 mg/dL (ref 6–23)
CO2: 26 mEq/L (ref 19–32)
Calcium: 9 mg/dL (ref 8.4–10.5)
Chloride: 101 mEq/L (ref 96–112)
Creatinine, Ser: 0.82 mg/dL (ref 0.40–1.20)
GFR: 68.02 mL/min (ref >60.00)
Glucose, Bld: 101 mg/dL — ABNORMAL HIGH (ref 70–99)
Potassium: 3.8 mEq/L (ref 3.5–5.1)
Sodium: 136 mEq/L (ref 135–145)
Total Bilirubin: 0.7 mg/dL (ref 0.2–1.2)
Total Protein: 6.9 g/dL (ref 6.0–8.3)

## 2021-11-04 LAB — CBC WITH DIFFERENTIAL/PLATELET
Basophils Absolute: 0 10*3/uL (ref 0.0–0.1)
Basophils Relative: 0.3 % (ref 0.0–3.0)
Eosinophils Absolute: 0 10*3/uL (ref 0.0–0.7)
Eosinophils Relative: 0.2 % (ref 0.0–5.0)
HCT: 35.7 % — ABNORMAL LOW (ref 36.0–46.0)
Hemoglobin: 11.9 g/dL — ABNORMAL LOW (ref 12.0–15.0)
Lymphocytes Relative: 18.8 % (ref 12.0–46.0)
Lymphs Abs: 2.3 10*3/uL (ref 0.7–4.0)
MCHC: 33.4 g/dL (ref 30.0–36.0)
MCV: 92.8 fl (ref 78.0–100.0)
Monocytes Absolute: 0.9 10*3/uL (ref 0.1–1.0)
Monocytes Relative: 7.3 % (ref 3.0–12.0)
Neutro Abs: 9.1 10*3/uL — ABNORMAL HIGH (ref 1.4–7.7)
Neutrophils Relative %: 73.4 % (ref 43.0–77.0)
Platelets: 189 10*3/uL (ref 150.0–400.0)
RBC: 3.85 Mil/uL — ABNORMAL LOW (ref 3.87–5.11)
RDW: 13.8 % (ref 11.5–15.5)
WBC: 12.5 10*3/uL — ABNORMAL HIGH (ref 4.0–10.5)

## 2021-11-04 LAB — URINALYSIS, ROUTINE W REFLEX MICROSCOPIC
Bilirubin Urine: NEGATIVE
Ketones, ur: NEGATIVE
Nitrite: NEGATIVE
Specific Gravity, Urine: <=1.005 — AB (ref 1.000–1.030)
Total Protein, Urine: NEGATIVE
Urine Glucose: NEGATIVE
Urobilinogen, UA: 0.2 (ref 0.0–1.0)
pH: 6.5 (ref 5.0–8.0)

## 2021-11-04 MED ORDER — METRONIDAZOLE 500 MG PO TABS: 500.0000 mg | ORAL_TABLET | Freq: Three times a day (TID) | ORAL | 0 refills | Status: AC

## 2021-11-04 MED ORDER — CIPROFLOXACIN HCL 500 MG PO TABS: 500.0000 mg | ORAL_TABLET | Freq: Two times a day (BID) | ORAL | 0 refills | Status: DC

## 2021-11-04 NOTE — Patient Instructions (Signed)
Please complete lab work prior to leaving. ?Start cipro/flagyl.  ?Change to clear liquid diet for the next few days.  You can slowly advance diet as tolerated when you are feeling better.  ?

## 2021-11-04 NOTE — Assessment & Plan Note (Signed)
Suspect diverticulitis. Check CMET/CBC, start empiric cipro/flagyl. Refer for abdominal CT. She is advised to go to the ER if severe/worsening pain. Tylenol as needed for pain. ?

## 2021-11-04 NOTE — Progress Notes (Signed)
? ?Subjective:  ? ?By signing my name below, I, [name], attest that this documentation has been prepared under the direction and in the presence of Harvey Cedars, NP 11/04/2021   ? ? Patient ID: Sophia [name], female    DOB: [birth year], 81 y.o.   MRN: [id number] ? ?Chief Complaint  ?Patient presents with  ? Generalized Body Aches  ?  Complains of body aches that started yesterday  ? Abdominal Pain  ?  Complains of abdominal pain since yesterday  ? ? ?HPI ?Patient is in today for an office visit. She presents with her daughter.  ? ?Body Aches - She complains of body aches that begun on 11/04/2021. ?Abdomen Tenderness and Cramping - She complains of abdomen tenderness and cramping that begun on 11/04/2021. She then took her temperature and saw the reading was a bit elevated. She notes there is a mild increase in frequency and urgency. She has had diverticulitis in the past but states that the pain feels different this recent time. She feels that she is managing the pain. She denies of n/v/d and burning. She states that her bowel movements are normal.  ? ? ?Health Maintenance Due  ?Topic Date Due  ? Pneumonia Vaccine 60+ Years old (1 - PCV) Never done  ? Hepatitis C Screening  Never done  ? Zoster Vaccines- Shingrix (1 of 2) Never done  ? COVID-19 Vaccine (3 - Mixed Product risk series) 04/16/2020  ? ? ?Past Medical History:  ?Diagnosis Date  ? Anxiety and depression   ? Arthritis   ? hands, knees, etc  ? Bladder prolapse, female, acquired 09/16/2014  ? Bowen's disease   ? Diverticulitis   ? Diverticulosis   ? GERD (gastroesophageal reflux disease)   ? H/O hematuria   ? for several years, work only revealed kidney stone on right side  ? History of chicken pox   ? History of kidney stones   ? Hyperlipidemia   ? Hypertension   ? Leg cramps, sleep related 04/02/2016  ? Myalgia 04/25/2017  ? Osteopenia 06/26/2014  ? Osteoporosis 06/26/2014  ? Plantar wart of left foot 10/15/2016  ? Right shoulder pain  04/23/2017  ? SCC (squamous cell carcinoma)   ? arms  ? Statin intolerance 09/11/2019  ? ? ?Past Surgical History:  ?Procedure Laterality Date  ? CHOLECYSTECTOMY  2005  ? LOOP RECORDER INSERTION N/A 04/15/2018  ? Procedure: LOOP RECORDER INSERTION;  Surgeon: Sanda Klein, MD;  Location: [facility];  Service: Cardiovascular;  Laterality: N/A;  ? SKIN BIOPSY    ? multiple  ? ? ?Family History  ?Problem Relation Age of Onset  ? Heart disease Mother   ?     aortic stenosis  ? Hyperlipidemia Mother   ? Hypertension Mother   ? Heart attack Mother   ? Cancer Mother   ?     BCC, SCC, skin  ? Hypertension Father   ? Cancer Father   ?     SCC, BCC, skin  ? Stroke Brother   ? Early death Daughter   ? Heart disease Maternal Grandfather   ?     MI  ? Tuberculosis Paternal Grandmother   ? Cancer Paternal Grandfather   ?     head and neck cancer, chewed tobacco  ? Hypertension Daughter   ? Heart disease Brother   ?     arrythmia  ? Hypertension Brother   ? Hyperlipidemia Brother   ? Birth defects Maternal Uncle   ? ? ?  Social History  ? ?Socioeconomic History  ? Marital status: Single  ?  Spouse name: Not on file  ? Number of children: Not on file  ? Years of education: Not on file  ? Highest education level: Not on file  ?Occupational History  ? Not on file  ?Tobacco Use  ? Smoking status: Never  ? Smokeless tobacco: Never  ?Vaping Use  ? Vaping Use: Never used  ?Substance and Sexual Activity  ? Alcohol use: No  ?  Alcohol/week: 0.0 standard drinks  ? Drug use: No  ? Sexual activity: Never  ?  Comment: divorced, widowed, lives with elderly parents, is the caregiver, no dietary restrictions.   ?Other Topics Concern  ? Not on file  ?Social History Narrative  ? Not on file  ? ?Social Determinants of Health  ? ?Financial Resource Strain: Low Risk   ? Difficulty of Paying Living Expenses: Not hard at all  ?Food Insecurity: No Food Insecurity  ? Worried About Charity fundraiser in the Last Year: Never true  ? Ran Out of Food  in the Last Year: Never true  ?Transportation Needs: No Transportation Needs  ? Lack of Transportation (Medical): No  ? Lack of Transportation (Non-Medical): No  ?Physical Activity: Sufficiently Active  ? Days of Exercise per Week: 7 days  ? Minutes of Exercise per Session: 60 min  ?Stress: No Stress Concern Present  ? Feeling of Stress : Not at all  ?Social Connections: Moderately Integrated  ? Frequency of Communication with Friends and Family: More than three times a week  ? Frequency of Social Gatherings with Friends and Family: More than three times a week  ? Attends Religious Services: More than 4 times per year  ? Active Member of Clubs or Organizations: Yes  ? Attends Archivist Meetings: More than 4 times per year  ? Marital Status: Widowed  ?Intimate Partner Violence: Not At Risk  ? Fear of Current or Ex-Partner: No  ? Emotionally Abused: No  ? Physically Abused: No  ? Sexually Abused: No  ? ? ?Outpatient Medications Prior to Visit  ?Medication Sig Dispense Refill  ? acetaminophen (TYLENOL) 500 MG tablet Take 1,000 mg by mouth 2 (two) times daily as needed for moderate pain or headache.    ? apixaban (ELIQUIS) 5 MG TABS tablet TAKE 1 TABLET(5 MG) BY MOUTH TWICE DAILY 180 tablet 1  ? Calcium Carb-Cholecalciferol (CALCIUM 600 + D PO) Take 1 tablet by mouth daily.    ? carvedilol (COREG) 25 MG tablet TAKE 1 TABLET(25 MG) BY MOUTH TWICE DAILY WITH A MEAL 180 tablet 1  ? Cholecalciferol (VITAMIN D) 50 MCG (2000 UT) tablet Take 1 tablet (2,000 Units total) by mouth daily.    ? Co-Enzyme Q10 200 MG CAPS Take 200 mg by mouth daily.    ? diltiazem (CARDIZEM CD) 240 MG 24 hr capsule TAKE 1 CAPSULE(240 MG) BY MOUTH DAILY 90 capsule 3  ? estradiol (ESTRACE) 0.1 MG/GM vaginal cream Place 1 Applicatorful vaginally 2 (two) times a week.    ? famotidine (PEPCID) 20 MG tablet Take 1 tablet (20 mg total) by mouth 2 (two) times daily as needed for heartburn or indigestion. Pt takes before bed daily 180 tablet 0  ?  Hypromellose (ARTIFICIAL TEARS OP) Place 1 drop into both eyes 2 (two) times daily.    ? Probiotic Product (PROBIOTIC DAILY PO) Take 1 capsule by mouth daily.     ? REPATHA SURECLICK 585 MG/ML SOAJ  INJECT 1 DOSE INTO THE SKIN EVERY 14 DAYS 2 mL 11  ? baclofen (LIORESAL) 10 MG tablet Take 0.5-1 tablets (5-10 mg total) by mouth at bedtime as needed for muscle spasms. (Patient not taking: Reported on 04/25/2021) 30 each 1  ? ?No facility-administered medications prior to visit.  ? ? ?Allergies  ?Allergen Reactions  ? Morphine And Related Swelling  ? Statins Other (See Comments)  ?  Myalgias, weakness (atorvastatin, RYR, rosuvastatin, pravastatin)  ? Sulfa Antibiotics Swelling and Rash  ? ? ?Review of Systems  ?Gastrointestinal:  Positive for abdominal pain (Tenderness and Cramping). Negative for constipation, diarrhea, nausea and vomiting.  ?Genitourinary:  Positive for frequency and urgency. Negative for dysuria.  ?Musculoskeletal:  Positive for myalgias.  ? ?   ?Objective:  ?  ?Physical Exam ?Constitutional:   ?   General: She is not in acute distress. ?   Appearance: Normal appearance. She is not ill-appearing.  ?HENT:  ?   Head: Normocephalic and atraumatic.  ?   Right Ear: External ear normal.  ?   Left Ear: External ear normal.  ?Eyes:  ?   Extraocular Movements: Extraocular movements intact.  ?   Pupils: Pupils are equal, round, and reactive to light.  ?Cardiovascular:  ?   Rate and Rhythm: Normal rate and regular rhythm.  ?   Heart sounds: No murmur heard. ?  No gallop.  ?Pulmonary:  ?   Effort: Pulmonary effort is normal. No respiratory distress.  ?   Breath sounds: Normal breath sounds. No wheezing or rales.  ?Abdominal:  ?   General: Bowel sounds are normal.  ?   Palpations: Abdomen is soft.  ?   Tenderness: There is abdominal tenderness (Mild) in the right upper quadrant. There is guarding (Left Upper).  ?Skin: ?   General: Skin is warm and dry.  ?Neurological:  ?   Mental Status: She is alert and oriented  to person, place, and time.  ?Psychiatric:     ?   Mood and Affect: Mood normal.     ?   Behavior: Behavior normal.     ?   Judgment: Judgment normal.  ? ? ?BP (!) 149/84 (BP Location: Right Arm, Patient Po

## 2021-11-05 ENCOUNTER — Telehealth: Payer: Self-pay | Admitting: Cardiovascular Disease

## 2021-11-05 LAB — URINE CULTURE
MICRO NUMBER:: 13371359
Result:: NO GROWTH
SPECIMEN QUALITY:: ADEQUATE

## 2021-11-05 NOTE — Telephone Encounter (Signed)
?*  STAT* If patient is at the pharmacy, call can be transferred to refill team. ? ? ?1. Which medications need to be refilled? (please list name of each medication and dose if known)  ? carvedilol (COREG) 25 MG tablet  ? ?2. Which pharmacy/location (including street and city if local pharmacy) is medication to be sent to?  ?[place], [place] ?3. Do they need a 30 day or 90 day supply?  ?90 day ? ?

## 2021-11-06 ENCOUNTER — Telehealth: Payer: Self-pay | Admitting: Family

## 2021-11-06 ENCOUNTER — Telehealth (HOSPITAL_BASED_OUTPATIENT_CLINIC_OR_DEPARTMENT_OTHER): Payer: Self-pay

## 2021-11-06 NOTE — Telephone Encounter (Signed)
Spoke to patient. She states that her abdominal discomfort is much better on current medications.She has not been contacted about CT scan.  Discussed finding on microscopic blood in urine- she states this is not a new finding for her and she is aware of this. She has never been a smoker. We discussed that since she is improving she does not need to complete the CT scan and she will let me know if her symptoms do not continue to improve.  ?

## 2021-11-07 NOTE — Telephone Encounter (Signed)
Called patients pharmacy and patient still has refills. Pharmacy informed me they will fill it for the patient. ?

## 2021-11-10 ENCOUNTER — Ambulatory Visit (INDEPENDENT_AMBULATORY_CARE_PROVIDER_SITE_OTHER): Payer: Medicare HMO | Admitting: Family

## 2021-11-10 ENCOUNTER — Ambulatory Visit (INDEPENDENT_AMBULATORY_CARE_PROVIDER_SITE_OTHER): Payer: Medicare HMO

## 2021-11-10 VITALS — BP 145/70 | HR 62 | Temp 98.0°F | Resp 16 | Wt 136.0 lb

## 2021-11-10 DIAGNOSIS — R1011 Right upper quadrant pain: Secondary | ICD-10-CM

## 2021-11-10 DIAGNOSIS — I4729 Other ventricular tachycardia: Secondary | ICD-10-CM | POA: Diagnosis not present

## 2021-11-10 LAB — CUP PACEART REMOTE DEVICE CHECK
Date Time Interrogation Session: 20230510230605
Implantable Pulse Generator Implant Date: 20191018

## 2021-11-10 NOTE — Patient Instructions (Signed)
Please complete CT scan as scheduled.  ?

## 2021-11-10 NOTE — Progress Notes (Signed)
? ?Subjective:  ? ?By signing my name below, I, Sophia [name], attest that this documentation has been prepared under the direction and in the presence of Sophia Alar NP. 11/10/2021 ?   ? ? Patient ID: Sophia [name], female    DOB: 1940-08-30, 81 y.o.   MRN: [id number] ? ?Chief Complaint  ?Patient presents with  ? Follow-up  ?  One week follow up LUQ pain, "doing better"  ? Flank Pain  ?  Complains of feeling tender on Left side  ? ? ?Flank Pain ?Associated symptoms include abdominal pain (Left lower abomen tenderness).  ?Patient is in today for a follow up visit. She is present with her daughter during this visit.  ? ?She complains of tenderness on her left lower abdomen. She notes the tenderness is in the same position of her prior gallbladder pain. She typically has tenderness in that area for the past couple of years. She continues taking anti-biotics since last visit and reports having a couple days left of her course. She denies having any urine. She notes her urine changed to dark color at one point since her last visit. Since then she is drinking more fluids.  ? ? ?Health Maintenance Due  ?Topic Date Due  ? Pneumonia Vaccine 41+ Years old (1 - PCV) Never done  ? Hepatitis C Screening  Never done  ? Zoster Vaccines- Shingrix (1 of 2) Never done  ? COVID-19 Vaccine (3 - Mixed Product risk series) 04/16/2020  ? ? ?Past Medical History:  ?Diagnosis Date  ? Anxiety and depression   ? Arthritis   ? hands, knees, etc  ? Bladder prolapse, female, acquired 09/16/2014  ? Bowen's disease   ? Diverticulitis   ? Diverticulosis   ? GERD (gastroesophageal reflux disease)   ? H/O hematuria   ? for several years, work only revealed kidney stone on right side  ? History of chicken pox   ? History of kidney stones   ? Hyperlipidemia   ? Hypertension   ? Leg cramps, sleep related 04/02/2016  ? Myalgia 04/25/2017  ? Osteopenia 06/26/2014  ? Osteoporosis 06/26/2014  ? Plantar wart of left foot 10/15/2016  ? Right shoulder  pain 04/23/2017  ? SCC (squamous cell carcinoma)   ? arms  ? Statin intolerance 09/11/2019  ? ? ?Past Surgical History:  ?Procedure Laterality Date  ? CHOLECYSTECTOMY  2005  ? LOOP RECORDER INSERTION N/A 04/15/2018  ? Procedure: LOOP RECORDER INSERTION;  Surgeon: Sanda Klein, MD;  Location: [facility];  Service: Cardiovascular;  Laterality: N/A;  ? SKIN BIOPSY    ? multiple  ? ? ?Family History  ?Problem Relation Age of Onset  ? Heart disease Mother   ?     aortic stenosis  ? Hyperlipidemia Mother   ? Hypertension Mother   ? Heart attack Mother   ? Cancer Mother   ?     BCC, SCC, skin  ? Hypertension Father   ? Cancer Father   ?     SCC, BCC, skin  ? Stroke Brother   ? Early death Daughter   ? Heart disease Maternal Grandfather   ?     MI  ? Tuberculosis Paternal Grandmother   ? Cancer Paternal Grandfather   ?     head and neck cancer, chewed tobacco  ? Hypertension Daughter   ? Heart disease Brother   ?     arrythmia  ? Hypertension Brother   ? Hyperlipidemia Brother   ?  Birth defects Maternal Uncle   ? ? ?Social History  ? ?Socioeconomic History  ? Marital status: Single  ?  Spouse name: Not on file  ? Number of children: Not on file  ? Years of education: Not on file  ? Highest education level: Not on file  ?Occupational History  ? Not on file  ?Tobacco Use  ? Smoking status: Never  ? Smokeless tobacco: Never  ?Vaping Use  ? Vaping Use: Never used  ?Substance and Sexual Activity  ? Alcohol use: No  ?  Alcohol/week: 0.0 standard drinks  ? Drug use: No  ? Sexual activity: Never  ?  Comment: divorced, widowed, lives with elderly parents, is the caregiver, no dietary restrictions.   ?Other Topics Concern  ? Not on file  ?Social History Narrative  ? Not on file  ? ?Social Determinants of Health  ? ?Financial Resource Strain: Low Risk   ? Difficulty of Paying Living Expenses: Not hard at all  ?Food Insecurity: No Food Insecurity  ? Worried About Charity fundraiser in the Last Year: Never true  ? Ran Out of  Food in the Last Year: Never true  ?Transportation Needs: No Transportation Needs  ? Lack of Transportation (Medical): No  ? Lack of Transportation (Non-Medical): No  ?Physical Activity: Sufficiently Active  ? Days of Exercise per Week: 7 days  ? Minutes of Exercise per Session: 60 min  ?Stress: No Stress Concern Present  ? Feeling of Stress : Not at all  ?Social Connections: Moderately Integrated  ? Frequency of Communication with Friends and Family: More than three times a week  ? Frequency of Social Gatherings with Friends and Family: More than three times a week  ? Attends Religious Services: More than 4 times per year  ? Active Member of Clubs or Organizations: Yes  ? Attends Archivist Meetings: More than 4 times per year  ? Marital Status: Widowed  ?Intimate Partner Violence: Not At Risk  ? Fear of Current or Ex-Partner: No  ? Emotionally Abused: No  ? Physically Abused: No  ? Sexually Abused: No  ? ? ?Outpatient Medications Prior to Visit  ?Medication Sig Dispense Refill  ? acetaminophen (TYLENOL) 500 MG tablet Take 1,000 mg by mouth 2 (two) times daily as needed for moderate pain or headache.    ? apixaban (ELIQUIS) 5 MG TABS tablet TAKE 1 TABLET(5 MG) BY MOUTH TWICE DAILY 180 tablet 1  ? Calcium Carb-Cholecalciferol (CALCIUM 600 + D PO) Take 1 tablet by mouth daily.    ? carvedilol (COREG) 25 MG tablet TAKE 1 TABLET(25 MG) BY MOUTH TWICE DAILY WITH A MEAL 180 tablet 1  ? Cholecalciferol (VITAMIN D) 50 MCG (2000 UT) tablet Take 1 tablet (2,000 Units total) by mouth daily.    ? ciprofloxacin (CIPRO) 500 MG tablet Take 1 tablet (500 mg total) by mouth 2 (two) times daily. 14 tablet 0  ? Co-Enzyme Q10 200 MG CAPS Take 200 mg by mouth daily.    ? diltiazem (CARDIZEM CD) 240 MG 24 hr capsule TAKE 1 CAPSULE(240 MG) BY MOUTH DAILY 90 capsule 3  ? estradiol (ESTRACE) 0.1 MG/GM vaginal cream Place 1 Applicatorful vaginally 2 (two) times a week.    ? famotidine (PEPCID) 20 MG tablet Take 1 tablet (20 mg  total) by mouth 2 (two) times daily as needed for heartburn or indigestion. Pt takes before bed daily 180 tablet 0  ? Hypromellose (ARTIFICIAL TEARS OP) Place 1 drop into both  eyes 2 (two) times daily.    ? metroNIDAZOLE (FLAGYL) 500 MG tablet Take 1 tablet (500 mg total) by mouth 3 (three) times daily for 7 days. 21 tablet 0  ? Probiotic Product (PROBIOTIC DAILY PO) Take 1 capsule by mouth daily.     ? REPATHA SURECLICK 782 MG/ML SOAJ INJECT 1 DOSE INTO THE SKIN EVERY 14 DAYS 2 mL 11  ? ?No facility-administered medications prior to visit.  ? ? ?Allergies  ?Allergen Reactions  ? Morphine And Related Swelling  ? Statins Other (See Comments)  ?  Myalgias, weakness (atorvastatin, RYR, rosuvastatin, pravastatin)  ? Sulfa Antibiotics Swelling and Rash  ? ? ?Review of Systems  ?Gastrointestinal:  Positive for abdominal pain (Left lower abomen tenderness).  ?Genitourinary:  Positive for flank pain.  ? ?   ?Objective:  ?  ?Physical Exam ?Constitutional:   ?   General: She is not in acute distress. ?   Appearance: Normal appearance. She is not ill-appearing.  ?HENT:  ?   Head: Normocephalic and atraumatic.  ?   Right Ear: External ear normal.  ?   Left Ear: External ear normal.  ?Eyes:  ?   Extraocular Movements: Extraocular movements intact.  ?   Pupils: Pupils are equal, round, and reactive to light.  ?Cardiovascular:  ?   Rate and Rhythm: Normal rate and regular rhythm.  ?   Heart sounds: Normal heart sounds. No murmur heard. ?  No gallop.  ?Pulmonary:  ?   Effort: Pulmonary effort is normal. No respiratory distress.  ?   Breath sounds: Normal breath sounds. No wheezing or rales.  ?Abdominal:  ?   General: Bowel sounds are normal. There is no distension.  ?   Palpations: Abdomen is soft. There is no mass.  ?   Tenderness: There is no abdominal tenderness.  ?Skin: ?   General: Skin is warm and dry.  ?Neurological:  ?   Mental Status: She is alert and oriented to person, place, and time.  ?Psychiatric:     ?   Judgment:  Judgment normal.  ? ? ?BP (!) 145/70 (BP Location: Right Arm, Patient Position: Sitting, Cuff Size: Small)   Pulse 62   Temp 98 ?F (36.7 ?C) (Oral)   Resp 16   Wt 136 lb (61.7 kg)   SpO2 100%   BMI 25.70 k

## 2021-11-10 NOTE — Assessment & Plan Note (Signed)
New. Overall symptoms have improved since last visit. I would like to have her complete the abdominal CT scan for further evaluation.  ?

## 2021-11-11 ENCOUNTER — Other Ambulatory Visit: Payer: Self-pay | Admitting: Cardiovascular Disease

## 2021-11-11 ENCOUNTER — Ambulatory Visit (HOSPITAL_BASED_OUTPATIENT_CLINIC_OR_DEPARTMENT_OTHER)
Admission: RE | Admit: 2021-11-11 | Discharge: 2021-11-11 | Disposition: A | Discharge status: Home / Self Care | Payer: Medicare HMO | Source: Ambulatory Visit | Attending: Family | Admitting: Family

## 2021-11-11 ENCOUNTER — Encounter (HOSPITAL_BASED_OUTPATIENT_CLINIC_OR_DEPARTMENT_OTHER): Payer: Self-pay

## 2021-11-11 DIAGNOSIS — R1012 Left upper quadrant pain: Secondary | ICD-10-CM | POA: Insufficient documentation

## 2021-11-11 DIAGNOSIS — K6389 Other specified diseases of intestine: Secondary | ICD-10-CM | POA: Diagnosis not present

## 2021-11-11 DIAGNOSIS — K5730 Diverticulosis of large intestine without perforation or abscess without bleeding: Secondary | ICD-10-CM | POA: Diagnosis not present

## 2021-11-11 DIAGNOSIS — N281 Cyst of kidney, acquired: Secondary | ICD-10-CM | POA: Diagnosis not present

## 2021-11-11 DIAGNOSIS — K7689 Other specified diseases of liver: Secondary | ICD-10-CM | POA: Diagnosis not present

## 2021-11-11 MED ORDER — IOHEXOL 300 MG/ML  SOLN
100.0000 mL | Freq: Once | INTRAMUSCULAR | Status: AC | PRN
  Administered 2021-11-11: 100 mL via INTRAVENOUS

## 2021-11-11 NOTE — Telephone Encounter (Signed)
Prescription refill request for Eliquis received. ?Indication:afib ?Last office visit:hilty 10/01/21 ?Scr:0.82 11/04/21 ?Age: 78f?Weight:61.7kg ? ?

## 2021-11-14 ENCOUNTER — Telehealth: Payer: Self-pay | Admitting: Family

## 2021-11-14 DIAGNOSIS — K639 Disease of intestine, unspecified: Secondary | ICD-10-CM

## 2021-11-14 NOTE — Telephone Encounter (Signed)
Reviewed CT results and need for GI follow up. She has seen Dr. Collene Mares in the past and would like to return to her. Advised her that we need to rule colon malignancy and she verbalizes understanding.

## 2021-11-14 NOTE — Telephone Encounter (Signed)
Called Pt and was Advised of  CT result, she stated understand  Waiting on the call for appt

## 2021-11-16 NOTE — Telephone Encounter (Signed)
Opened in error

## 2021-11-17 ENCOUNTER — Encounter: Payer: Medicare HMO | Admitting: Family Medicine

## 2021-11-17 ENCOUNTER — Ambulatory Visit (INDEPENDENT_AMBULATORY_CARE_PROVIDER_SITE_OTHER): Payer: Medicare HMO | Admitting: Family Medicine

## 2021-11-17 ENCOUNTER — Encounter: Payer: Self-pay | Admitting: Family Medicine

## 2021-11-17 VITALS — BP 138/79 | HR 63 | Resp 20 | Ht 61.0 in | Wt 134.6 lb

## 2021-11-17 DIAGNOSIS — Z0000 Encounter for general adult medical examination without abnormal findings: Secondary | ICD-10-CM | POA: Diagnosis not present

## 2021-11-17 DIAGNOSIS — R1011 Right upper quadrant pain: Secondary | ICD-10-CM | POA: Diagnosis not present

## 2021-11-17 DIAGNOSIS — H6123 Impacted cerumen, bilateral: Secondary | ICD-10-CM | POA: Diagnosis not present

## 2021-11-17 DIAGNOSIS — R739 Hyperglycemia, unspecified: Secondary | ICD-10-CM | POA: Diagnosis not present

## 2021-11-17 DIAGNOSIS — E559 Vitamin D deficiency, unspecified: Secondary | ICD-10-CM | POA: Diagnosis not present

## 2021-11-17 DIAGNOSIS — I10 Essential (primary) hypertension: Secondary | ICD-10-CM | POA: Diagnosis not present

## 2021-11-17 DIAGNOSIS — K219 Gastro-esophageal reflux disease without esophagitis: Secondary | ICD-10-CM

## 2021-11-17 DIAGNOSIS — Z0001 Encounter for general adult medical examination with abnormal findings: Secondary | ICD-10-CM

## 2021-11-17 LAB — COMPREHENSIVE METABOLIC PANEL
ALT: 8 U/L (ref 0–35)
AST: 15 U/L (ref 0–37)
Albumin: 4 g/dL (ref 3.5–5.2)
Alkaline Phosphatase: 74 U/L (ref 39–117)
BUN: 11 mg/dL (ref 6–23)
CO2: 28 mEq/L (ref 19–32)
Calcium: 9.3 mg/dL (ref 8.4–10.5)
Chloride: 102 mEq/L (ref 96–112)
Creatinine, Ser: 0.82 mg/dL (ref 0.40–1.20)
GFR: 68 mL/min (ref >60.00)
Glucose, Bld: 94 mg/dL (ref 70–99)
Potassium: 4.2 mEq/L (ref 3.5–5.1)
Sodium: 140 mEq/L (ref 135–145)
Total Bilirubin: 0.4 mg/dL (ref 0.2–1.2)
Total Protein: 6.3 g/dL (ref 6.0–8.3)

## 2021-11-17 LAB — CBC
HCT: 35.5 % — ABNORMAL LOW (ref 36.0–46.0)
Hemoglobin: 12 g/dL (ref 12.0–15.0)
MCHC: 33.8 g/dL (ref 30.0–36.0)
MCV: 93.4 fl (ref 78.0–100.0)
Platelets: 191 10*3/uL (ref 150.0–400.0)
RBC: 3.8 Mil/uL — ABNORMAL LOW (ref 3.87–5.11)
RDW: 13.6 % (ref 11.5–15.5)
WBC: 8.2 10*3/uL (ref 4.0–10.5)

## 2021-11-17 LAB — HEMOGLOBIN A1C: Hgb A1c MFr Bld: 5.9 % (ref 4.6–6.5)

## 2021-11-17 LAB — VITAMIN D 25 HYDROXY (VIT D DEFICIENCY, FRACTURES): VITD: 41.22 ng/mL (ref 30.00–100.00)

## 2021-11-17 LAB — TSH: TSH: 1.72 u[IU]/mL (ref 0.35–5.50)

## 2021-11-17 MED ORDER — FAMOTIDINE 20 MG PO TABS: 20.0000 mg | ORAL_TABLET | Freq: Two times a day (BID) | ORAL | 1 refills | Status: DC | PRN

## 2021-11-17 NOTE — Assessment & Plan Note (Addendum)
Reports that she is starting to improve, but continues with some mild RUQ discomfort. She is tolerating a soft diet well - recommended she cut back to clear liquids again if symptoms begin worsening. Rechecking CBC today. GI referral was placed - encouraged her to call their office if she doesn't hear anything by the end of this week. Patient aware of signs/symptoms requiring further/urgent evaluation.

## 2021-11-17 NOTE — Progress Notes (Signed)
Complete physical exam  Patient: Sophia [name]   DOB: 10-22-1940   81 y.o. Female  MRN: [id number]  Subjective:    Chief Complaint  Patient presents with   Annual Exam    TSERING [name] is a 81 y.o. female who presents today for a complete physical exam. She reports consuming a general diet. Home exercise routine includes walking. She generally feels well. She reports sleeping well. She does have additional problems to discuss today.   Recently saw Melissa NP for GI symptoms and was treated for diverticulitis. CT scan later showed asymmetric wall thickening and GI referral was placed (she has not heard from them yet).  She started feeling bad again last Friday (originally started 2 weeks ago) with RUQ pain. She did a clear liquid diet for a few days to manage and home. Now she is feeling mostly better, but continues with occasional tenderness. She is still eating a soft diet and doing okay right now. No severe pain, fevers. Was having loose stool, but that has started improving since progressing to soft diet (now stools are soft). Everything seems to be improving, but she is scared of it getting worse again since she was uncomfortable last weekend.   She is also reporting that her right ear feels stopped up. States she tends to have wax build-up but has been doing good managing at home for the past few years. She denies any ear pain, but has the fullness/clogged sensation with some muffled hearing.      Most recent fall risk assessment:    11/17/2021   12:38 PM  Fall Risk   Falls in the past year? 0  Number falls in past yr: 0  Injury with Fall? 0     Most recent depression screenings:    11/17/2021   10:56 AM 09/01/2021    1:49 PM  PHQ 2/9 Scores  PHQ - 2 Score 0 0  PHQ- 9 Score 0     Vision:Within last year and Dental: No current dental problems and Receives regular dental care  Patient Active Problem List   Diagnosis Date Noted   Right upper quadrant abdominal pain  11/10/2021   Urinary frequency 11/04/2021   Elevated coronary artery calcium score 04/09/2021   Nonintractable headache 01/14/2021   Allergic drug reaction 03/26/2020   Vitamin D deficiency 12/28/2019   Complex renal cyst 12/28/2019   Elevated liver function tests 12/22/2019   Bug bite 12/22/2019   Persistent atrial fibrillation ([place]) 11/13/2019   Secondary hypercoagulable state ([place]) 11/13/2019   Encounter for loop recorder check 10/20/2019   Long term (current) use of anticoagulants 10/20/2019   Chronic bilateral low back pain without sciatica 09/24/2019   Statin intolerance 09/11/2019   Diarrhea 08/08/2018   Raynaud phenomenon 08/08/2018   Pulsatile tinnitus 04/26/2018   Hyperglycemia 04/26/2018   Cervicalgia 04/26/2018   Near syncope 04/15/2018   NSVT (nonsustained ventricular tachycardia) ([place]) 04/14/2018   Palpitations 02/25/2018   Bilateral carotid bruits 02/25/2018   Myalgia 04/25/2017   Left shoulder pain 04/23/2017   Plantar wart of left foot 10/15/2016   Leg cramps, sleep related 04/02/2016   Hip pain 05/29/2015   Preventative health care 03/24/2015   Osteoporosis 06/26/2014   Arthritis    GERD (gastroesophageal reflux disease)    Hypertension    Hyperlipidemia    History of kidney stones    H/O hematuria    Diverticulosis    History of chicken pox    Anxiety and depression  SCC (squamous cell carcinoma)    Bowen's disease    Allergies  Allergen Reactions   Morphine And Related Swelling   Statins Other (See Comments)    Myalgias, weakness (atorvastatin, RYR, rosuvastatin, pravastatin)   Sulfa Antibiotics Swelling and Rash      Patient Care Team: Mosie Lukes, MD as PCP - General (Family Medicine) Lorretta Harp, MD as PCP - Cardiology (Cardiology) Azucena Fallen, MD as Consulting Physician (Obstetrics and Gynecology) Rolm Bookbinder, MD as Consulting Physician (Dermatology) Katy Apo, MD as Consulting Physician (Ophthalmology)    Outpatient Medications Prior to Visit  Medication Sig   acetaminophen (TYLENOL) 500 MG tablet Take 1,000 mg by mouth 2 (two) times daily as needed for moderate pain or headache.   Calcium Carb-Cholecalciferol (CALCIUM 600 + D PO) Take 1 tablet by mouth daily.   carvedilol (COREG) 25 MG tablet TAKE 1 TABLET(25 MG) BY MOUTH TWICE DAILY WITH A MEAL   Cholecalciferol (VITAMIN D) 50 MCG (2000 UT) tablet Take 1 tablet (2,000 Units total) by mouth daily.   ciprofloxacin (CIPRO) 500 MG tablet Take 1 tablet (500 mg total) by mouth 2 (two) times daily.   Co-Enzyme Q10 200 MG CAPS Take 200 mg by mouth daily.   diltiazem (CARDIZEM CD) 240 MG 24 hr capsule TAKE 1 CAPSULE(240 MG) BY MOUTH DAILY   ELIQUIS 5 MG TABS tablet TAKE 1 TABLET(5 MG) BY MOUTH TWICE DAILY   estradiol (ESTRACE) 0.1 MG/GM vaginal cream Place 1 Applicatorful vaginally 2 (two) times a week.   Hypromellose (ARTIFICIAL TEARS OP) Place 1 drop into both eyes 2 (two) times daily.   Probiotic Product (PROBIOTIC DAILY PO) Take 1 capsule by mouth daily.    REPATHA SURECLICK 616 MG/ML SOAJ INJECT 1 DOSE INTO THE SKIN EVERY 14 DAYS   [DISCONTINUED] famotidine (PEPCID) 20 MG tablet Take 1 tablet (20 mg total) by mouth 2 (two) times daily as needed for heartburn or indigestion. Pt takes before bed daily   No facility-administered medications prior to visit.    ROS All review of systems negative except what is listed in the HPI        Objective:     BP 138/79   Pulse 63   Resp 20   Ht '5\' 1"'$  (1.549 m)   Wt 134 lb 9.6 oz (61.1 kg)   SpO2 98%   BMI 25.43 kg/m    Physical Exam Vitals reviewed.  Constitutional:      Appearance: Normal appearance. She is normal weight.  HENT:     Head: Normocephalic and atraumatic.     Right Ear: There is impacted cerumen.     Left Ear: There is impacted cerumen.     Nose: Nose normal.     Mouth/Throat:     Mouth: Mucous membranes are moist.     Pharynx: Oropharynx is clear.  Eyes:      Extraocular Movements: Extraocular movements intact.     Conjunctiva/sclera: Conjunctivae normal.     Pupils: Pupils are equal, round, and reactive to light.  Cardiovascular:     Rate and Rhythm: Normal rate and regular rhythm.  Pulmonary:     Effort: Pulmonary effort is normal.     Breath sounds: Normal breath sounds.  Abdominal:     General: Bowel sounds are increased.     Tenderness: There is abdominal tenderness in the right upper quadrant. There is no guarding or rebound.  Genitourinary:    Comments: deferred Musculoskeletal:  General: Normal range of motion.     Cervical back: Normal range of motion and neck supple. No tenderness.  Lymphadenopathy:     Cervical: No cervical adenopathy.  Skin:    General: Skin is warm and dry.     Capillary Refill: Capillary refill takes less than 2 seconds.  Neurological:     General: No focal deficit present.     Mental Status: She is alert and oriented to person, place, and time.  Psychiatric:        Mood and Affect: Mood normal.        Behavior: Behavior normal.        Thought Content: Thought content normal.        Judgment: Judgment normal.     No results found for any visits on 11/17/21.     Assessment & Plan:    Routine Health Maintenance and Physical Exam  Immunization History  Administered Date(s) Administered   Fluad Quad(high Dose 65+) 04/12/2019   Influenza, High Dose Seasonal PF 03/26/2017, 04/22/2018   Influenza,inj,Quad PF,6+ Mos 03/19/2015   Influenza-Unspecified 04/29/2014   PFIZER(Purple Top)SARS-COV-2 Vaccination 03/03/2020, 03/19/2020   Tdap 12/10/2016    Health Maintenance  Topic Date Due   Pneumonia Vaccine 32+ Years old (1 - PCV) Never done   Hepatitis C Screening  Never done   Zoster Vaccines- Shingrix (1 of 2) Never done   COVID-19 Vaccine (3 - Mixed Product risk series) 04/16/2020   INFLUENZA VACCINE  01/27/2022   TETANUS/TDAP  12/11/2026   DEXA SCAN  Completed   HPV VACCINES  Aged Out     Discussed health benefits of physical activity, and encouraged her to engage in regular exercise appropriate for her age and condition.  Problem List Items Addressed This Visit       Cardiovascular and Mediastinum   Hypertension   Relevant Orders   CBC   Comprehensive metabolic panel   TSH     Digestive   GERD (gastroesophageal reflux disease)   Relevant Medications   famotidine (PEPCID) 20 MG tablet     Other   Hyperglycemia   Relevant Orders   Hemoglobin A1c   Comprehensive metabolic panel   Vitamin D deficiency   Relevant Orders   VITAMIN D 25 Hydroxy (Vit-D Deficiency, Fractures)   Right upper quadrant abdominal pain    Reports that she is starting to improve, but continues with some mild RUQ discomfort. She is tolerating a soft diet well - recommended she cut back to clear liquids again if symptoms begin worsening. Rechecking CBC today. GI referral was placed - encouraged her to call their office if she doesn't hear anything by the end of this week. Patient aware of signs/symptoms requiring further/urgent evaluation.        Relevant Orders   CBC   Comprehensive metabolic panel   Other Visit Diagnoses     Encounter for routine adult health examination with abnormal findings    -  Primary   Relevant Orders   Hemoglobin A1c   CBC   Comprehensive metabolic panel   TSH   Bilateral impacted cerumen          Cerumen impaction - CMA was unable to successfully irrigate in office, wax is hard. Recommended she start using debrox for the next several days to soften the wax, then she can follow-up and we can try to irrigate again.     Return in about 6 months (around 05/20/2022) for routine f/u PCP.  Terrilyn Saver, NP

## 2021-11-20 DIAGNOSIS — Z1211 Encounter for screening for malignant neoplasm of colon: Secondary | ICD-10-CM | POA: Diagnosis not present

## 2021-11-20 DIAGNOSIS — K582 Mixed irritable bowel syndrome: Secondary | ICD-10-CM | POA: Diagnosis not present

## 2021-11-20 DIAGNOSIS — K219 Gastro-esophageal reflux disease without esophagitis: Secondary | ICD-10-CM | POA: Diagnosis not present

## 2021-11-20 DIAGNOSIS — R933 Abnormal findings on diagnostic imaging of other parts of digestive tract: Secondary | ICD-10-CM | POA: Diagnosis not present

## 2021-11-20 DIAGNOSIS — K5730 Diverticulosis of large intestine without perforation or abscess without bleeding: Secondary | ICD-10-CM | POA: Diagnosis not present

## 2021-11-21 ENCOUNTER — Telehealth: Payer: Self-pay

## 2021-11-21 NOTE — Telephone Encounter (Signed)
   Pre-operative Risk Assessment    Patient Name: Sophia [name]  DOB: [birth year] MRN: [id number]      Request for Surgical Clearance    Procedure:   Colonoscopy  Date of Surgery:  Clearance 01/05/22                                 Surgeon:  Dr. Johnney Killian Group or Practice Name:  [facility]  Phone number:  [phone] Fax number:  [phone]   Type of Clearance Requested:   - Medical  - Pharmacy:  Hold Apixaban (Eliquis) Need instructions    Type of Anesthesia:   Propofol   Additional requests/questions:    Tana Conch   11/21/2021, 11:16 AM

## 2021-11-21 NOTE — Telephone Encounter (Signed)
   Primary Cardiologist: Quay Burow, MD  Chart reviewed as part of pre-operative protocol coverage. Given past medical history and time since last visit, based on ACC/AHA guidelines, JOSHLYN BEADLE would be at acceptable risk for the planned procedure without further cardiovascular testing.   Patient with diagnosis of afib on Eliquis for anticoagulation.     Procedure: colonoscopy Date of procedure: 01/05/22   CHA2DS2-VASc Score = 5  This indicates a 7.2% annual risk of stroke. The patient's score is based upon: CHF History: 0 HTN History: 1 Diabetes History: 0 Stroke History: 0 Vascular Disease History: 1 Age Score: 2 Gender Score: 1   CrCl 73m/min Platelet count 191K   Per office protocol, patient can hold Eliquis for 1-2 days prior to procedure.  I will route this recommendation to the requesting party via Epic fax function and remove from pre-op pool.  Please call with questions.  JJossie Ng Kamylah Manzo NP-C    11/21/2021, [facility] [address] Office [phone] [phone]

## 2021-11-21 NOTE — Telephone Encounter (Signed)
Patient with diagnosis of afib on Eliquis for anticoagulation.    Procedure: colonoscopy Date of procedure: 01/05/22  CHA2DS2-VASc Score = 5  This indicates a 7.2% annual risk of stroke. The patient's score is based upon: CHF History: 0 HTN History: 1 Diabetes History: 0 Stroke History: 0 Vascular Disease History: 1 Age Score: 2 Gender Score: 1   CrCl 56m/min Platelet count 191K  Per office protocol, patient can hold Eliquis for 1-2 days prior to procedure.

## 2021-11-25 ENCOUNTER — Ambulatory Visit (INDEPENDENT_AMBULATORY_CARE_PROVIDER_SITE_OTHER): Payer: Medicare HMO | Admitting: Family

## 2021-11-25 VITALS — BP 164/73 | HR 65 | Temp 97.5°F | Resp 16 | Wt 135.0 lb

## 2021-11-25 DIAGNOSIS — S50862A Insect bite (nonvenomous) of left forearm, initial encounter: Secondary | ICD-10-CM | POA: Diagnosis not present

## 2021-11-25 DIAGNOSIS — W57XXXA Bitten or stung by nonvenomous insect and other nonvenomous arthropods, initial encounter: Secondary | ICD-10-CM | POA: Diagnosis not present

## 2021-11-25 DIAGNOSIS — K639 Disease of intestine, unspecified: Secondary | ICD-10-CM

## 2021-11-25 DIAGNOSIS — H6121 Impacted cerumen, right ear: Secondary | ICD-10-CM | POA: Diagnosis not present

## 2021-11-25 NOTE — Progress Notes (Signed)
Subjective:   By signing my name below, I, Sophia [name], attest that this documentation has been prepared under the direction and in the presence of Karie Chimera, NP 11/25/2021   Patient ID: Sophia [name], female    DOB: Dec 26, 1940, 81 y.o.   MRN: [id number]  Chief Complaint  Patient presents with   Ear Fullness    Here for follow up and ear lavage     HPI Patient is in today for an office visit. She is with her daughter.   Ear Fullness - She complains  of bilateral ear fullness. Her daughter reports that her right ear is worse than her [name] ear. She had ear pain on 11/25/2021 but symptoms are resolved as of today's visit.   GI Specialist - She is interested in being referred to a different gastrologist due to a bad experience with her most recent consultation.  [name] Skin Lesion - Her daughter complains of a possible tick in the patients forearm.   Health Maintenance Due  Topic Date Due   Pneumonia Vaccine 61+ Years old (1 - PCV) Never done   Hepatitis C Screening  Never done   Zoster Vaccines- Shingrix (1 of 2) Never done   COVID-19 Vaccine (3 - Mixed Product risk series) 04/16/2020    Past Medical History:  Diagnosis Date   Anxiety and depression    Arthritis    hands, knees, etc   Bladder prolapse, female, acquired 09/16/2014   Bowen's disease    Diverticulitis    Diverticulosis    GERD (gastroesophageal reflux disease)    H/O hematuria    for several years, work only revealed kidney stone on right side   History of chicken pox    History of kidney stones    Hyperlipidemia    Hypertension    Leg cramps, sleep related 04/02/2016   Myalgia 04/25/2017   Osteopenia 06/26/2014   Osteoporosis 06/26/2014   Plantar wart of [name] foot 10/15/2016   Right shoulder pain 04/23/2017   SCC (squamous cell carcinoma)    arms   Statin intolerance 09/11/2019    Past Surgical History:  Procedure Laterality Date   CHOLECYSTECTOMY  2005   LOOP RECORDER INSERTION N/A  04/15/2018   Procedure: LOOP RECORDER INSERTION;  Surgeon: Sanda Klein, MD;  Location: [facility];  Service: Cardiovascular;  Laterality: N/A;   SKIN BIOPSY     multiple    Family History  Problem Relation Age of Onset   Heart disease Mother        aortic stenosis   Hyperlipidemia Mother    Hypertension Mother    Heart attack Mother    Cancer Mother        BCC, SCC, skin   Hypertension Father    Cancer Father        SCC, BCC, skin   Stroke Brother    Early death Daughter    Heart disease Maternal Grandfather        MI   Tuberculosis Paternal Grandmother    Cancer Paternal Grandfather        head and neck cancer, chewed tobacco   Hypertension Daughter    Heart disease Brother        arrythmia   Hypertension Brother    Hyperlipidemia Brother    Birth defects Maternal Uncle     Social History   Socioeconomic History   Marital status: Single    Spouse name: Not on file   Number of children: Not  on file   Years of education: Not on file   Highest education level: Not on file  Occupational History   Not on file  Tobacco Use   Smoking status: Never   Smokeless tobacco: Never  Vaping Use   Vaping Use: Never used  Substance and Sexual Activity   Alcohol use: No    Alcohol/week: 0.0 standard drinks   Drug use: No   Sexual activity: Never    Comment: divorced, widowed, lives with elderly parents, is the caregiver, no dietary restrictions.   Other Topics Concern   Not on file  Social History Narrative   Not on file   Social Determinants of Health   Financial Resource Strain: Low Risk    Difficulty of Paying Living Expenses: Not hard at all  Food Insecurity: No Food Insecurity   Worried About Charity fundraiser in the Last Year: Never true   [place] in the Last Year: Never true  Transportation Needs: No Transportation Needs   Lack of Transportation (Medical): No   Lack of Transportation (Non-Medical): No  Physical Activity: Sufficiently  Active   Days of Exercise per Week: 7 days   Minutes of Exercise per Session: 60 min  Stress: No Stress Concern Present   Feeling of Stress : Not at all  Social Connections: Moderately Integrated   Frequency of Communication with Friends and Family: More than three times a week   Frequency of Social Gatherings with Friends and Family: More than three times a week   Attends Religious Services: More than 4 times per year   Active Member of [employer] or Organizations: Yes   Attends Archivist Meetings: More than 4 times per year   Marital Status: Widowed  Human resources officer Violence: Not At Risk   Fear of Current or Ex-Partner: No   Emotionally Abused: No   Physically Abused: No   Sexually Abused: No    Outpatient Medications Prior to Visit  Medication Sig Dispense Refill   acetaminophen (TYLENOL) 500 MG tablet Take 1,000 mg by mouth 2 (two) times daily as needed for moderate pain or headache.     Calcium Carb-Cholecalciferol (CALCIUM 600 + D PO) Take 1 tablet by mouth daily.     carvedilol (COREG) 25 MG tablet TAKE 1 TABLET(25 MG) BY MOUTH TWICE DAILY WITH A MEAL 180 tablet 1   Cholecalciferol (VITAMIN D) 50 MCG (2000 UT) tablet Take 1 tablet (2,000 Units total) by mouth daily.     Co-Enzyme Q10 200 MG CAPS Take 200 mg by mouth daily.     diltiazem (CARDIZEM CD) 240 MG 24 hr capsule TAKE 1 CAPSULE(240 MG) BY MOUTH DAILY 90 capsule 3   ELIQUIS 5 MG TABS tablet TAKE 1 TABLET(5 MG) BY MOUTH TWICE DAILY 180 tablet 1   estradiol (ESTRACE) 0.1 MG/GM vaginal cream Place 1 Applicatorful vaginally 2 (two) times a week.     famotidine (PEPCID) 20 MG tablet Take 1 tablet (20 mg total) by mouth 2 (two) times daily as needed for heartburn or indigestion. Pt takes before bed daily 90 tablet 1   Hypromellose (ARTIFICIAL TEARS OP) Place 1 drop into both eyes 2 (two) times daily.     Probiotic Product (PROBIOTIC DAILY PO) Take 1 capsule by mouth daily.      REPATHA SURECLICK 458 MG/ML SOAJ INJECT 1  DOSE INTO THE SKIN EVERY 14 DAYS 2 mL 11   ciprofloxacin (CIPRO) 500 MG tablet Take 1 tablet (500 mg total) by  mouth 2 (two) times daily. 14 tablet 0   No facility-administered medications prior to visit.    Allergies  Allergen Reactions   Morphine And Related Swelling   Statins Other (See Comments)    Myalgias, weakness (atorvastatin, RYR, rosuvastatin, pravastatin)   Sulfa Antibiotics Swelling and Rash    Review of Systems  HENT:  Positive for ear pain.        (+) Bilateral Ear Fullness  Skin:        (+) [name] Forearm Lesion      Objective:    Physical Exam Constitutional:      General: She is not in acute distress.    Appearance: Normal appearance. She is not ill-appearing.  HENT:     Head: Normocephalic and atraumatic.     Right Ear: External ear normal.     [name] Ear: External ear normal.     Ears:     Comments: Debris Noted in Right Ear Canal Minimal Cerumen [name] Ear Eyes:     Extraocular Movements: Extraocular movements intact.     Pupils: Pupils are equal, round, and reactive to light.  Cardiovascular:     Rate and Rhythm: Normal rate and regular rhythm.     Heart sounds: Normal heart sounds. No murmur heard.   No gallop.  Pulmonary:     Effort: Pulmonary effort is normal. No respiratory distress.     Breath sounds: Normal breath sounds. No wheezing or rales.  Skin:    General: Skin is warm and dry.     Findings: Lesion (Ecchymotic) present.  Neurological:     Mental Status: She is alert and oriented to person, place, and time.  Psychiatric:        Mood and Affect: Mood normal.        Behavior: Behavior normal.        Judgment: Judgment normal.    BP (!) 164/73 (BP Location: Right Arm, Patient Position: Sitting, Cuff Size: Small)   Pulse 65   Temp (!) 97.5 F (36.4 C) (Oral)   Resp 16   Wt 135 lb (61.2 kg)   SpO2 100%   BMI 25.51 kg/m  Wt Readings from Last 3 Encounters:  11/25/21 135 lb (61.2 kg)  11/17/21 134 lb 9.6 oz (61.1 kg)  11/10/21 136  lb (61.7 kg)       Assessment & Plan:   Problem List Items Addressed This Visit       Unprioritized   Tick bite of [name] forearm    New. No sign of infection. No rash. Monitor.        Impacted cerumen of right ear    CMA irrigated right ear.  Removed cerumen easily revealing normal TM.        Colonic thickening - Primary    She would like a referral to a different GI provider.  New referral placed.        Relevant Orders   Ambulatory referral to Gastroenterology      No orders of the defined types were placed in this encounter.   I, Nance Pear, NP, personally preformed the services described in this documentation.  All medical record entries made by the scribe were at my direction and in my presence.  I have reviewed the chart and discharge instructions (if applicable) and agree that the record reflects my personal performance and is accurate and complete. 11/25/2021   I,Amber Collins,acting as a scribe for Nance Pear, NP.,have documented all relevant documentation on  the behalf of Nance Pear, NP,as directed by  Nance Pear, NP while in the presence of Nance Pear, NP.    Nance Pear, NP

## 2021-11-25 NOTE — Assessment & Plan Note (Signed)
New. No sign of infection. No rash. Monitor.

## 2021-11-25 NOTE — Assessment & Plan Note (Signed)
CMA irrigated right ear.  Removed cerumen easily revealing normal TM.

## 2021-11-25 NOTE — Assessment & Plan Note (Signed)
She would like a referral to a different GI provider.  New referral placed.

## 2021-11-28 NOTE — Progress Notes (Signed)
Carelink Summary Report / Loop Recorder 

## 2021-12-01 ENCOUNTER — Other Ambulatory Visit: Payer: Self-pay | Admitting: Internal Medicine

## 2021-12-05 ENCOUNTER — Encounter: Payer: Self-pay | Admitting: Family

## 2021-12-05 ENCOUNTER — Telehealth (INDEPENDENT_AMBULATORY_CARE_PROVIDER_SITE_OTHER): Payer: Medicare HMO | Admitting: Family

## 2021-12-05 DIAGNOSIS — R21 Rash and other nonspecific skin eruption: Secondary | ICD-10-CM | POA: Insufficient documentation

## 2021-12-05 MED ORDER — DOXYCYCLINE HYCLATE 100 MG PO TABS: 100.0000 mg | ORAL_TABLET | Freq: Two times a day (BID) | ORAL | 0 refills | Status: DC

## 2021-12-05 NOTE — Progress Notes (Addendum)
MyChart Video Visit    Virtual Visit via Video Note   This visit type was conducted due to national recommendations for restrictions regarding the COVID-19 Pandemic (e.g. social distancing) in an effort to limit this patient's exposure and mitigate transmission in our community. This patient is at least at moderate risk for complications without adequate follow up. This format is felt to be most appropriate for this patient at this time. Physical exam was limited by quality of the video and audio technology used for the visit. CMA was able to get the patient set up on a video visit.  Patient location: Home Patient and provider in visit Provider location: Office  I discussed the limitations of evaluation and management by telemedicine and the availability of in person appointments. The patient expressed understanding and agreed to proceed.  Visit Date: 12/05/2021  Today's healthcare provider: Nance Pear, NP     Subjective:    Patient ID: Sophia [name], female    DOB: 1941/03/03, 81 y.o.   MRN: [id number]  Chief Complaint  Patient presents with   Insect Bite    Patient reports tick head surfaced after visit  5/30     HPI Patient is in today for a virtual office visit.  Tick Bite - She complains of a rash around her tick bite. Around 11/29/2021, she noticed a ring developing around her tick bite. She states that the area is fading a bit but is still prevalent. She also states that her family is insisting for her to be prescribed medication for lyme disease. She denies of any fever or joint pain.  Past Medical History:  Diagnosis Date   Anxiety and depression    Arthritis    hands, knees, etc   Bladder prolapse, female, acquired 09/16/2014   Bowen's disease    Diverticulitis    Diverticulosis    GERD (gastroesophageal reflux disease)    H/O hematuria    for several years, work only revealed kidney stone on right side   History of chicken pox    History of  kidney stones    Hyperlipidemia    Hypertension    Leg cramps, sleep related 04/02/2016   Myalgia 04/25/2017   Osteopenia 06/26/2014   Osteoporosis 06/26/2014   Plantar wart of [name] foot 10/15/2016   Right shoulder pain 04/23/2017   SCC (squamous cell carcinoma)    arms   Statin intolerance 09/11/2019    Past Surgical History:  Procedure Laterality Date   CHOLECYSTECTOMY  2005   LOOP RECORDER INSERTION N/A 04/15/2018   Procedure: LOOP RECORDER INSERTION;  Surgeon: Sanda Klein, MD;  Location: [facility];  Service: Cardiovascular;  Laterality: N/A;   SKIN BIOPSY     multiple    Family History  Problem Relation Age of Onset   Heart disease Mother        aortic stenosis   Hyperlipidemia Mother    Hypertension Mother    Heart attack Mother    Cancer Mother        BCC, SCC, skin   Hypertension Father    Cancer Father        SCC, BCC, skin   Stroke Brother    Early death Daughter    Heart disease Maternal Grandfather        MI   Tuberculosis Paternal Grandmother    Cancer Paternal Grandfather        head and neck cancer, chewed tobacco   Hypertension Daughter  Heart disease Brother        arrythmia   Hypertension Brother    Hyperlipidemia Brother    Birth defects Maternal Uncle     Social History   Socioeconomic History   Marital status: Single    Spouse name: Not on file   Number of children: Not on file   Years of education: Not on file   Highest education level: Not on file  Occupational History   Not on file  Tobacco Use   Smoking status: Never   Smokeless tobacco: Never  Vaping Use   Vaping Use: Never used  Substance and Sexual Activity   Alcohol use: No    Alcohol/week: 0.0 standard drinks of alcohol   Drug use: No   Sexual activity: Never    Comment: divorced, widowed, lives with elderly parents, is the caregiver, no dietary restrictions.   Other Topics Concern   Not on file  Social History Narrative   Not on file   Social  Determinants of Health   Financial Resource Strain: Low Risk  (09/01/2021)   Overall Financial Resource Strain (CARDIA)    Difficulty of Paying Living Expenses: Not hard at all  Food Insecurity: No Food Insecurity (09/01/2021)   Hunger Vital Sign    Worried About Running Out of Food in the Last Year: Never true    Ran Out of Food in the Last Year: Never true  Transportation Needs: No Transportation Needs (09/01/2021)   PRAPARE - Hydrologist (Medical): No    Lack of Transportation (Non-Medical): No  Physical Activity: Sufficiently Active (09/01/2021)   Exercise Vital Sign    Days of Exercise per Week: 7 days    Minutes of Exercise per Session: 60 min  Stress: No Stress Concern Present (09/01/2021)   [place]    Feeling of Stress : Not at all  Social Connections: Moderately Integrated (09/01/2021)   Social Connection and Isolation Panel [NHANES]    Frequency of Communication with Friends and Family: More than three times a week    Frequency of Social Gatherings with Friends and Family: More than three times a week    Attends Religious Services: More than 4 times per year    Active Member of [employer] or Organizations: Yes    Attends Archivist Meetings: More than 4 times per year    Marital Status: Widowed  Intimate Partner Violence: Not At Risk (09/01/2021)   Humiliation, Afraid, Rape, and Kick questionnaire    Fear of Current or Ex-Partner: No    Emotionally Abused: No    Physically Abused: No    Sexually Abused: No    Outpatient Medications Prior to Visit  Medication Sig Dispense Refill   acetaminophen (TYLENOL) 500 MG tablet Take 1,000 mg by mouth 2 (two) times daily as needed for moderate pain or headache.     Calcium Carb-Cholecalciferol (CALCIUM 600 + D PO) Take 1 tablet by mouth daily.     carvedilol (COREG) 25 MG tablet TAKE 1 TABLET(25 MG) BY MOUTH TWICE DAILY WITH A MEAL 180  tablet 1   Cholecalciferol (VITAMIN D) 50 MCG (2000 UT) tablet Take 1 tablet (2,000 Units total) by mouth daily.     Co-Enzyme Q10 200 MG CAPS Take 200 mg by mouth daily.     diltiazem (CARDIZEM CD) 240 MG 24 hr capsule TAKE 1 CAPSULE(240 MG) BY MOUTH DAILY 90 capsule 3   ELIQUIS 5 MG  TABS tablet TAKE 1 TABLET(5 MG) BY MOUTH TWICE DAILY 180 tablet 1   estradiol (ESTRACE) 0.1 MG/GM vaginal cream Place 1 Applicatorful vaginally 2 (two) times a week.     famotidine (PEPCID) 20 MG tablet Take 1 tablet (20 mg total) by mouth 2 (two) times daily as needed for heartburn or indigestion. Pt takes before bed daily 90 tablet 1   Hypromellose (ARTIFICIAL TEARS OP) Place 1 drop into both eyes 2 (two) times daily.     Probiotic Product (PROBIOTIC DAILY PO) Take 1 capsule by mouth daily.      REPATHA SURECLICK 010 MG/ML SOAJ INJECT 1 DOSE INTO THE SKIN EVERY 14 DAYS 2 mL 11   No facility-administered medications prior to visit.    Allergies  Allergen Reactions   Morphine And Related Swelling   Statins Other (See Comments)    Myalgias, weakness (atorvastatin, RYR, rosuvastatin, pravastatin)   Sulfa Antibiotics Swelling and Rash    Review of Systems  Constitutional:  Negative for fever.  Musculoskeletal:  Negative for joint pain.  Skin:  Positive for rash (Circle Around Tick Bite).       Objective:    Gen: awake, alert, NAD Resp: no increased work of breathing. Skin : tick bite [name] forearm with red ring surrounding pale ring.  Psych: A and O x 3.  Calm and pleasant affect Assessment & Plan:   Problem List Items Addressed This Visit       Unprioritized   Rash - Primary    Bull's eye rash noted on [name] forearm.  New.  Pt is advised as follows:  Please begin doxycycline '100mg'$  twice daily for 10 days.  Call if you develop new joint pain, fever >101 or new/worsening rash.  Patient verbalizes understanding.        Meds ordered this encounter  Medications   doxycycline (VIBRA-TABS) 100  MG tablet    Sig: Take 1 tablet (100 mg total) by mouth 2 (two) times daily.    Dispense:  20 tablet    Refill:  0    Order Specific Question:   Supervising Provider    Answer:   Penni Homans A [[id number]]    I discussed the assessment and treatment plan with the patient. The patient was provided an opportunity to ask questions and all were answered. The patient agreed with the plan and demonstrated an understanding of the instructions.   The patient was advised to call back or seek an in-person evaluation if the symptoms worsen or if the condition fails to improve as anticipated.  I provided 20 minutes of face-to-face time during this encounter.   I,Amber Collins,acting as a Education administrator for [employer], NP.,have documented all relevant documentation on the behalf of Nance Pear, NP,as directed by  Nance Pear, NP while in the presence of Nance Pear, NP.   Nance Pear, NP [employer] [facility] [employer] [phone] (phone) [phone] (fax)  [place]

## 2021-12-05 NOTE — Assessment & Plan Note (Signed)
Bull's eye rash noted on left forearm.  New.  Pt is advised as follows:  Please begin doxycycline '100mg'$  twice daily for 10 days.  Call if you develop new joint pain, fever >101 or new/worsening rash.  Patient verbalizes understanding.

## 2021-12-05 NOTE — Patient Instructions (Signed)
Please begin doxycycline '100mg'$  twice daily for 10 days.  Call if you develop new joint pain, fever >101 or new/worsening rash.

## 2021-12-12 ENCOUNTER — Encounter: Payer: Self-pay | Admitting: Gastroenterology

## 2021-12-15 ENCOUNTER — Ambulatory Visit (INDEPENDENT_AMBULATORY_CARE_PROVIDER_SITE_OTHER): Payer: Medicare HMO

## 2021-12-15 DIAGNOSIS — R55 Syncope and collapse: Secondary | ICD-10-CM

## 2021-12-16 LAB — CUP PACEART REMOTE DEVICE CHECK
Date Time Interrogation Session: 20230612231609
Implantable Pulse Generator Implant Date: 20191018

## 2021-12-22 DIAGNOSIS — D0439 Carcinoma in situ of skin of other parts of face: Secondary | ICD-10-CM | POA: Diagnosis not present

## 2021-12-22 DIAGNOSIS — C44329 Squamous cell carcinoma of skin of other parts of face: Secondary | ICD-10-CM | POA: Diagnosis not present

## 2021-12-22 DIAGNOSIS — Z85828 Personal history of other malignant neoplasm of skin: Secondary | ICD-10-CM | POA: Diagnosis not present

## 2021-12-29 ENCOUNTER — Telehealth: Payer: Self-pay

## 2021-12-29 NOTE — Telephone Encounter (Signed)
Spoke with patient, informed her that her loop recorder has reached ERI, she could either elect to have device left in or have Dr. Loletha Grayer take it out patient wasn't sure at this point informed her to just call back if she decides to have device taken out patient voiced understanding informed patient that she could dispose of home monitor.

## 2021-12-31 IMAGING — CT CT CARDIAC CORONARY ARTERY CALCIUM SCORE
3 series · 14 of 20 positions shown, 15 images · non-contrast
Comparison: None.
COMPARISON: None.

Addendum:
EXAM:
OVER-READ INTERPRETATION  CT CHEST

The following report is an over-read performed by radiologist Dr.
Norintis Sonata [REDACTED] on 01/24/2020. This
over-read does not include interpretation of cardiac or coronary
anatomy or pathology. The coronary calcium score interpretation by
the cardiologist is attached.
CLINICAL DATA: Risk stratification
Coronary Calcium Score
TECHNIQUE: The patient was scanned on a Siemens Force scanner. Axial
non-contrast 3 mm slices were carried out through the heart. The
data set was analyzed on a dedicated work station and scored using
the Agatson method.

[Series 2: casc 3.0 bv41 2 bestdiast 70 % · axial · 0.33mm/px · z∈[-201,-129]mm · 4 of 41 slices shown, 5 images]
[im 9/41  vessel]
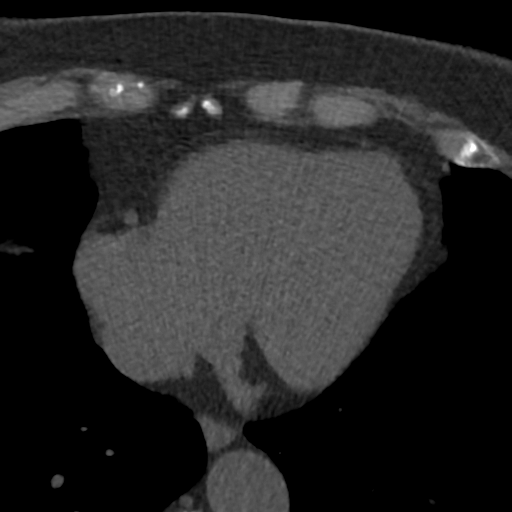
[im 9/41  lung]
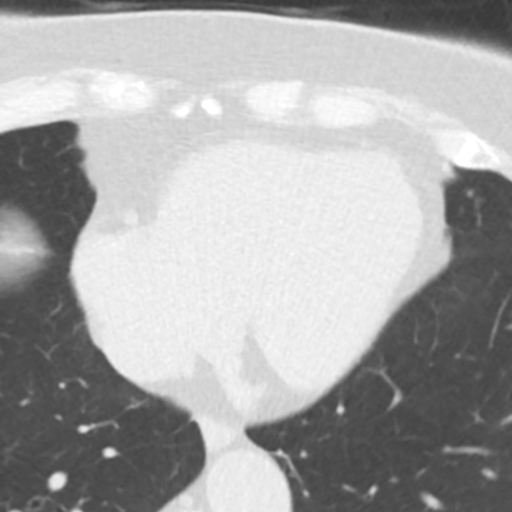
[im 17/41  vessel]
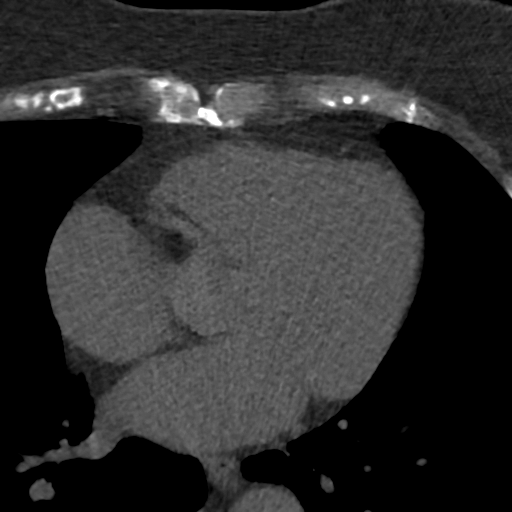
[im 25/41  vessel]
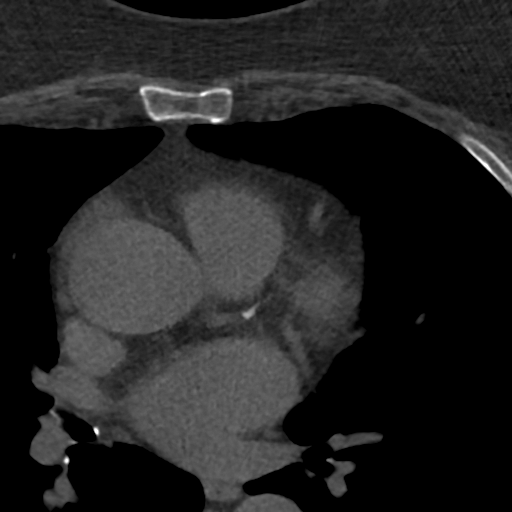
[im 33/41  vessel]
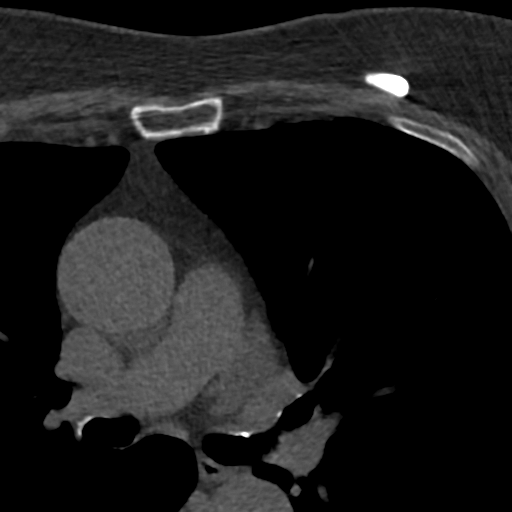

[Series 3: lung 70 % · axial · 0.65mm/px · z∈[-206,-126]mm · 5 of 41 slices shown]
[im 7/41  lung]
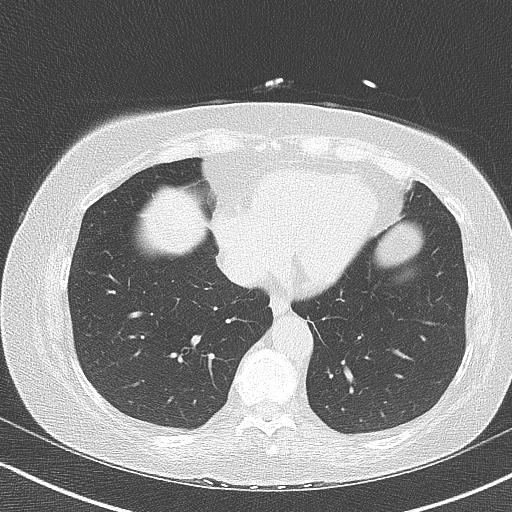
[im 14/41  lung]
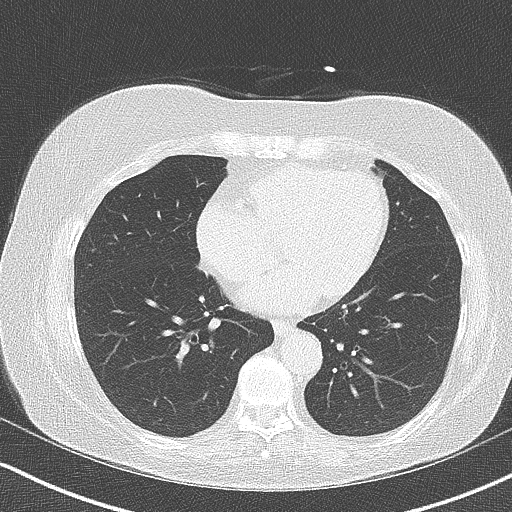
[im 21/41  lung]
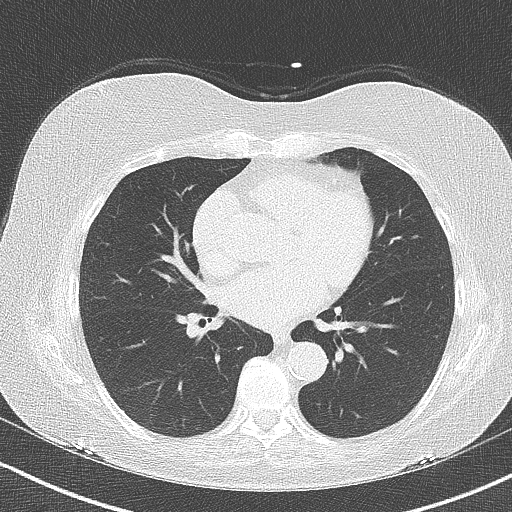
[im 27/41  lung]
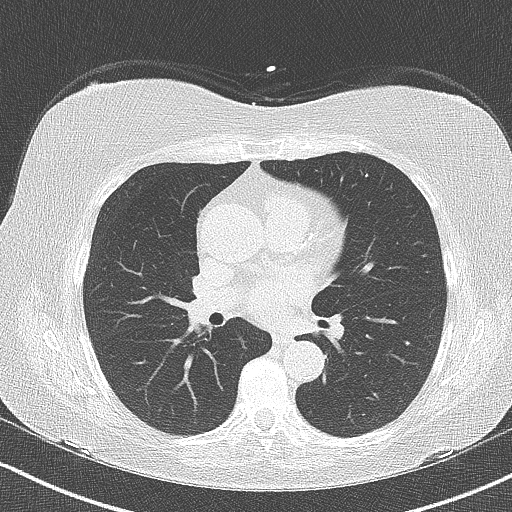
[im 34/41  lung]
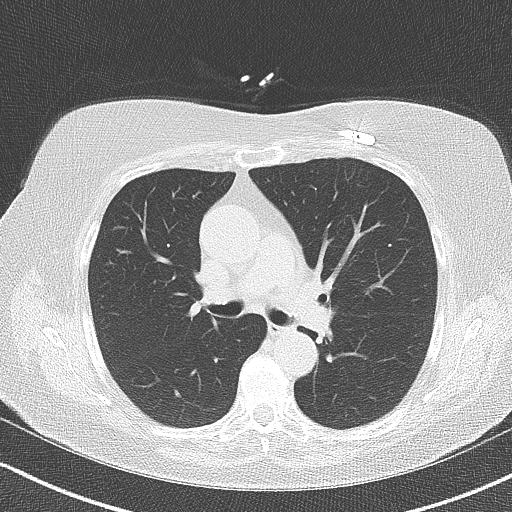

[Series 4: lung st 70 % · axial · 0.65mm/px · z∈[-206,-126]mm · 5 of 41 slices shown]
[im 7/41  lung]
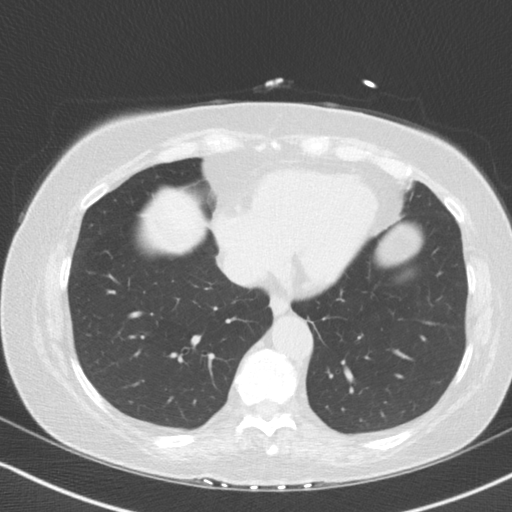
[im 14/41  lung]
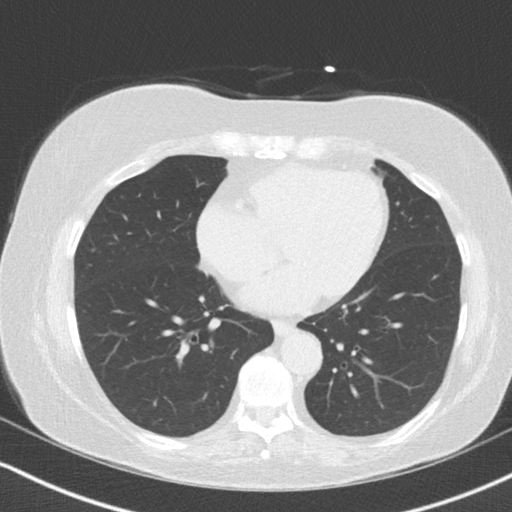
[im 21/41  lung]
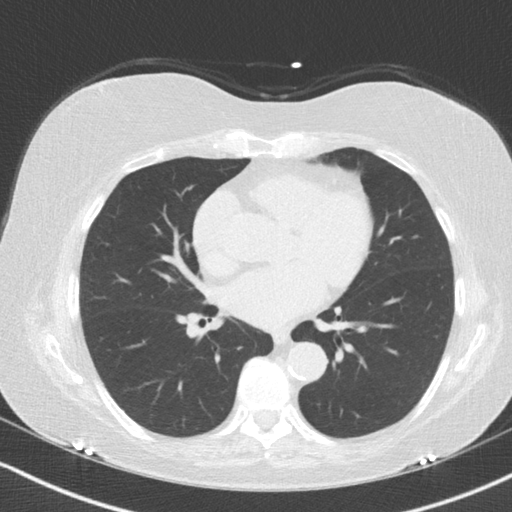
[im 27/41  lung]
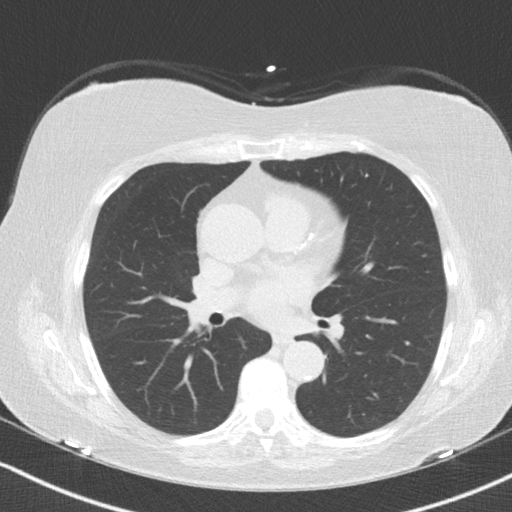
[im 34/41  lung]
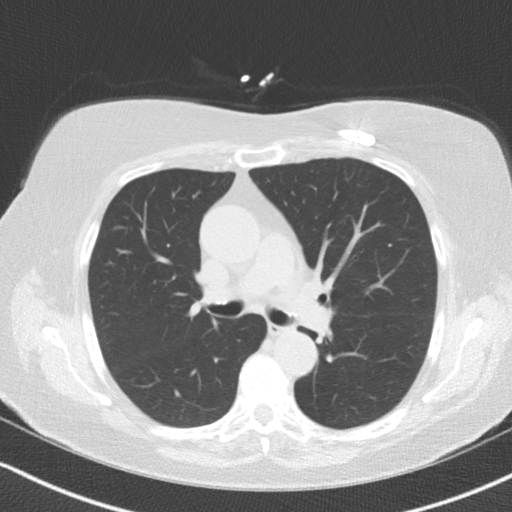

[14 of 20 positions shown; findings below may reference images not displayed]

FINDINGS: Aortic atherosclerosis. Within the visualized portions of the thorax
there are no suspicious appearing pulmonary nodules or masses, there
is no acute consolidative airspace disease, no pleural effusions, no
pneumothorax and no lymphadenopathy. Visualized portions of the
upper abdomen demonstrates a 1 cm low-attenuation lesion in segment
2 of the liver, incompletely characterized on today's non-contrast
CT examination, but statistically likely to represent a cyst. There
are no aggressive appearing lytic or blastic lesions noted in the
visualized portions of the skeleton.
IMPRESSION: 1.  Aortic Atherosclerosis (H5KOU-K7U.U).
FINDINGS: Non-cardiac: See separate report from [REDACTED].

Ascending Aorta: Mildly dilated 3.8 cm at the level of the main PA
bifurcation.

Pericardium: Normal

Coronary arteries: Normal origin.  Aortic atherosclerosis.
IMPRESSION: Coronary calcium score of 304. This was 76th percentile for age and
sex matched control

Mildly dilated aorta to 3.8 cm at the level of the main PA
bifurcation

Aortic atherosclerosis

*** End of Addendum ***
EXAM:
OVER-READ INTERPRETATION  CT CHEST

The following report is an over-read performed by radiologist Dr.
Norintis Sonata [REDACTED] on 01/24/2020. This
over-read does not include interpretation of cardiac or coronary
anatomy or pathology. The coronary calcium score interpretation by
the cardiologist is attached.
FINDINGS: Aortic atherosclerosis. Within the visualized portions of the thorax
there are no suspicious appearing pulmonary nodules or masses, there
is no acute consolidative airspace disease, no pleural effusions, no
pneumothorax and no lymphadenopathy. Visualized portions of the
upper abdomen demonstrates a 1 cm low-attenuation lesion in segment
2 of the liver, incompletely characterized on today's non-contrast
CT examination, but statistically likely to represent a cyst. There
are no aggressive appearing lytic or blastic lesions noted in the
visualized portions of the skeleton.
IMPRESSION: 1.  Aortic Atherosclerosis ([id number]-[id number].U).

## 2022-01-05 NOTE — Progress Notes (Signed)
Carelink Summary Report / Loop Recorder 

## 2022-01-05 NOTE — Addendum Note (Signed)
Addended by: Cheri Kearns A on: 01/05/2022 02:35 PM   Modules accepted: Level of Service

## 2022-01-07 ENCOUNTER — Ambulatory Visit: Payer: Medicare HMO | Admitting: Gastroenterology

## 2022-01-07 VITALS — BP 130/82 | HR 64 | Ht 59.5 in | Wt 135.0 lb

## 2022-01-07 DIAGNOSIS — R109 Unspecified abdominal pain: Secondary | ICD-10-CM | POA: Diagnosis not present

## 2022-01-07 DIAGNOSIS — K219 Gastro-esophageal reflux disease without esophagitis: Secondary | ICD-10-CM

## 2022-01-07 DIAGNOSIS — R933 Abnormal findings on diagnostic imaging of other parts of digestive tract: Secondary | ICD-10-CM | POA: Diagnosis not present

## 2022-01-07 DIAGNOSIS — Z7901 Long term (current) use of anticoagulants: Secondary | ICD-10-CM

## 2022-01-07 DIAGNOSIS — G8929 Other chronic pain: Secondary | ICD-10-CM | POA: Diagnosis not present

## 2022-01-07 MED ORDER — PANTOPRAZOLE SODIUM 40 MG PO TBEC: 40.0000 mg | DELAYED_RELEASE_TABLET | Freq: Every day | ORAL | 5 refills | Status: DC

## 2022-01-07 MED ORDER — NA SULFATE-K SULFATE-MG SULF 17.5-3.13-1.6 GM/177ML PO SOLN: 1.0000 | Freq: Once | ORAL | 0 refills | Status: AC

## 2022-01-07 NOTE — Patient Instructions (Addendum)
If you are age 80 or older, your body mass index should be between 23-30. Your Body mass index is 26.81 kg/m. If this is out of the aforementioned range listed, please consider follow up with your Primary Care Provider.  If you are age 55 or younger, your body mass index should be between 19-25. Your Body mass index is 26.81 kg/m. If this is out of the aformentioned range listed, please consider follow up with your Primary Care Provider.   ________________________________________________________   Sophia [name] have been scheduled for a colonoscopy on 02-09-22. Please follow written instructions given to you at your visit today.  Please pick up your prep supplies at the pharmacy within the next 1-3 days. If you use inhalers (even only as needed), please bring them with you on the day of your procedure.  PLEASE HOLD YOUR ELIQUIS 1 DAY PRIOR TO YOUR PROCEDURE.  We have sent the following medications to your pharmacy for you to pick up at your convenience: Pantoprazole 40 mg: Take once daily   Thank you for entrusting me with your care and for choosing [employer]

## 2022-01-07 NOTE — Progress Notes (Addendum)
01/07/2022 Sophia [name] [id number] 1941/04/14   HISTORY OF PRESENT ILLNESS: This is a 81 year old female who is new to our office.  She is here today to discuss colonoscopy in regards to recent CT scan findings.  She is here with her daughter.  She tells me that she has had chronic right-sided abdominal pain that seems to have started after she had her gallbladder removed.  It is constantly just uncomfortable, but then she gets bouts of much more severe pain has been associated with elevated white blood cell counts, etc.  She had an episode of that back in May that responded to antibiotics.  She has had right-sided diverticulitis seen on CT scans previously.  Most recent CT scan of the abdomen and pelvis with contrast on 11/12/2021 as follows:  IMPRESSION: There is no evidence of intestinal obstruction or pneumoperitoneum. There is no hydronephrosis. Appendix is not dilated.   Diverticulosis of colon. There is 2.2 x 1.6 cm asymmetric wall thickening in the right lateral margin of sigmoid colon. Possibility of malignant neoplasm is not excluded. Endoscopy should be considered.   Hepatic and renal cysts.  Small hiatal hernia.   Other findings as described in the body of the report.  She was seen by Dr. Collene Mares and scheduled for colonoscopy, but had a bad experience at their office so decided not to proceed and wanted to be seen here instead.  She tells me that in the mornings she is often back-and-forth to the bathroom to relieve her bowels a few times.  She has tried powder fiber supplements in the past, but gets a lot of gas and bloating with that.  No rectal bleeding.  She also reports heartburn and reflux that she has experienced over the years.  She follows good dietary measures and does not eat late at night.  She takes only Pepcid on an as-needed basis, which she has been using quite regularly at night to prevent her from waking up with symptoms.  Has never had an EGD in the  past.  Colonoscopy 10/2008 with Dr. Collene Mares was normal.   Past Medical History:  Diagnosis Date   Anxiety and depression    Arthritis    hands, knees, etc   Bladder prolapse, female, acquired 09/16/2014   Bowen's disease    Chronic atrial fibrillation ([place])    Diverticulitis    Diverticulosis    GERD (gastroesophageal reflux disease)    H/O hematuria    for several years, work only revealed kidney stone on right side   Hepatic cyst    Hiatal hernia    History of chicken pox    History of kidney stones    Hyperlipidemia    Hypertension    IBS (irritable bowel syndrome)    Leg cramps, sleep related 04/02/2016   Myalgia 04/25/2017   Osteopenia 06/26/2014   Osteoporosis 06/26/2014   Plantar wart of left foot 10/15/2016   Renal cyst    Right shoulder pain 04/23/2017   SCC (squamous cell carcinoma)    arms   Statin intolerance 09/11/2019   Past Surgical History:  Procedure Laterality Date   CHOLECYSTECTOMY  06/30/2003   LOOP RECORDER INSERTION N/A 04/15/2018   Procedure: LOOP RECORDER INSERTION;  Surgeon: Sanda Klein, MD;  Location: [facility];  Service: Cardiovascular;  Laterality: N/A;   SKIN BIOPSY     multiple   SKIN CANCER EXCISION     forehead   TONSILLECTOMY     age 13  reports that she has never smoked. She has never used smokeless tobacco. She reports that she does not drink alcohol and does not use drugs. family history includes Asthma in her daughter; Birth defects in her maternal uncle; Cancer in her father, mother, and paternal grandfather; Colon cancer in her maternal uncle; Colon polyps in her daughter; Crohn's disease in her niece; Diverticulitis in her daughter; Heart attack in her mother and son; Heart disease in her brother, maternal grandfather, mother, and son; Hyperlipidemia in her brother, daughter, and mother; Hypertension in her brother, daughter, father, and mother; Kidney Stones in her daughter; Stroke in her brother; Thyroid disease in  her daughter; Tuberculosis in her paternal grandmother. Allergies  Allergen Reactions   Morphine And Related Swelling   Statins Other (See Comments)    Myalgias, weakness (atorvastatin, RYR, rosuvastatin, pravastatin)   Sulfa Antibiotics Swelling and Rash      Outpatient Encounter Medications as of 01/07/2022  Medication Sig   acetaminophen (TYLENOL) 500 MG tablet Take 1,000 mg by mouth 2 (two) times daily as needed for moderate pain or headache.   Calcium Carb-Cholecalciferol (CALCIUM 600 + D PO) Take 1 tablet by mouth daily.   carvedilol (COREG) 25 MG tablet TAKE 1 TABLET(25 MG) BY MOUTH TWICE DAILY WITH A MEAL   Cholecalciferol (VITAMIN D) 50 MCG (2000 UT) tablet Take 1 tablet (2,000 Units total) by mouth daily.   Co-Enzyme Q10 200 MG CAPS Take 200 mg by mouth daily.   diltiazem (CARDIZEM CD) 240 MG 24 hr capsule TAKE 1 CAPSULE(240 MG) BY MOUTH DAILY   doxycycline (VIBRA-TABS) 100 MG tablet Take 1 tablet (100 mg total) by mouth 2 (two) times daily.   ELIQUIS 5 MG TABS tablet TAKE 1 TABLET(5 MG) BY MOUTH TWICE DAILY   estradiol (ESTRACE) 0.1 MG/GM vaginal cream Place 1 Applicatorful vaginally 2 (two) times a week.   famotidine (PEPCID) 20 MG tablet Take 1 tablet (20 mg total) by mouth 2 (two) times daily as needed for heartburn or indigestion. Pt takes before bed daily   Hypromellose (ARTIFICIAL TEARS OP) Place 1 drop into both eyes 2 (two) times daily.   Probiotic Product (PROBIOTIC DAILY PO) Take 1 capsule by mouth daily.    REPATHA SURECLICK 268 MG/ML SOAJ INJECT 1 DOSE INTO THE SKIN EVERY 14 DAYS   No facility-administered encounter medications on file as of 01/07/2022.     REVIEW OF SYSTEMS  : All other systems reviewed and negative except where noted in the History of Present Illness.   PHYSICAL EXAM: BP 130/82 (BP Location: Left Arm, Patient Position: Sitting, Cuff Size: Normal)   Pulse 64   Ht 4' 11.5" (1.511 m) Comment: height measured without shoes  Wt 135 lb (61.2 kg)    BMI 26.81 kg/m  General: Well developed white female in no acute distress Head: Normocephalic and atraumatic Eyes:  Sclerae anicteric, conjunctiva pink. Ears: Normal auditory acuity Lungs: Clear throughout to auscultation; no W/R/R. Heart: Regular rate and rhythm; no M/R/G. Abdomen: Soft, non-distended.  BS present.  Minimal right sided TTP. Rectal:  Will be done at the time of colonoscopy. Musculoskeletal: Symmetrical with no gross deformities  Skin: No lesions on visible extremities Extremities: No edema  Neurological: Alert oriented x 4, grossly non-focal Psychological:  Alert and cooperative. Normal mood and affect  ASSESSMENT AND PLAN *Abnormal CT scan showing asymmetric wall thickening in the right lateral margin of sigmoid colon. Possibility of malignant neoplasm is not excluded.  Will plan for colonoscopy with Dr.  Pyrtle. *Chronic right-sided abdominal pain with bouts of more acute severe worsening pain: She has had right-sided diverticulitis before.  May have some symptomatic diverticulosis at baseline with intermittent bouts of this right-sided diverticulitis.  Responded to antibiotics when treated recently.  No other findings on CT scan to account for this pain. *GERD: Symptoms have been longstanding.  No history of EGD.  Follows dietary measures, does not eat late at night, uses Pepcid as needed.  We will plan for EGD at the time of colonoscopy as well.  I am going to start her on pantoprazole 40 mg daily.  Prescription sent to pharmacy. *Chronic anticoagulation with Eliquis for history of paroxysmal atrial fibrillation:  Will hold Eliqius for 24 hours prior to endoscopic procedures - will instruct when and how to resume after procedure. Benefits and risks of procedure explained including risks of bleeding, perforation, infection, missed lesions, reactions to medications and possible need for hospitalization and surgery for complications. Additional rare but real risk of stroke or  other vascular clotting events off of Eliquis also explained and need to seek urgent help if any signs of these problems occur. Will communicate by phone or EMR with patient's prescribing provider, Dr. Gwenlyn Found, to confirm that holding Eliquis is reasonable in this case.    The risks, benefits, and alternatives to EGD and colonosopy were discussed with the patient and she consents to proceed.    **Addendum: Received colonoscopy records from Dr. Collene Mares from May 2010 and at which time she was only found to have pandiverticulosis with otherwise normal colonoscopy up to the terminal ileum.  Records are being sent for scanning.   CC:  [name], NP

## 2022-01-07 NOTE — Progress Notes (Signed)
Addendum: Reviewed and agree with assessment and management plan. Mykia Holton M, MD  

## 2022-01-12 DIAGNOSIS — N8110 Cystocele, unspecified: Secondary | ICD-10-CM | POA: Diagnosis not present

## 2022-01-12 DIAGNOSIS — Z4689 Encounter for fitting and adjustment of other specified devices: Secondary | ICD-10-CM | POA: Diagnosis not present

## 2022-01-12 DIAGNOSIS — N814 Uterovaginal prolapse, unspecified: Secondary | ICD-10-CM | POA: Diagnosis not present

## 2022-02-02 ENCOUNTER — Encounter: Payer: Self-pay | Admitting: Internal Medicine

## 2022-02-05 ENCOUNTER — Other Ambulatory Visit: Payer: Self-pay | Admitting: Cardiovascular Disease

## 2022-02-09 ENCOUNTER — Encounter: Payer: Self-pay | Admitting: Internal Medicine

## 2022-02-09 ENCOUNTER — Ambulatory Visit (AMBULATORY_SURGERY_CENTER): Payer: Medicare HMO | Admitting: Internal Medicine

## 2022-02-09 VITALS — BP 124/64 | HR 61 | Temp 97.5°F | Resp 14 | Ht 59.0 in | Wt 135.0 lb

## 2022-02-09 DIAGNOSIS — K219 Gastro-esophageal reflux disease without esophagitis: Secondary | ICD-10-CM

## 2022-02-09 DIAGNOSIS — R933 Abnormal findings on diagnostic imaging of other parts of digestive tract: Secondary | ICD-10-CM

## 2022-02-09 DIAGNOSIS — K449 Diaphragmatic hernia without obstruction or gangrene: Secondary | ICD-10-CM | POA: Diagnosis not present

## 2022-02-09 DIAGNOSIS — K5730 Diverticulosis of large intestine without perforation or abscess without bleeding: Secondary | ICD-10-CM

## 2022-02-09 DIAGNOSIS — R109 Unspecified abdominal pain: Secondary | ICD-10-CM | POA: Diagnosis not present

## 2022-02-09 MED ORDER — SODIUM CHLORIDE 0.9 % IV SOLN: 500.0000 mL | Freq: Once | INTRAVENOUS | Status: DC

## 2022-02-09 NOTE — Op Note (Signed)
[facility] Patient Name: Sophia [name] Procedure Date: 02/09/2022 2:39 PM MRN: [id number] Endoscopist: Jerene Bears , MD Age: 81 Referring MD:  Date of Birth: 1941/02/15 Gender: Female Account #: [id number] Procedure:                Colonoscopy Indications:              Abnormal CT of the GI tract (question of sigmoid                            mass) and personal history of diverticulitis,                            right-sided abd pain Medicines:                Monitored Anesthesia Care Procedure:                Pre-Anesthesia Assessment:                           - Prior to the procedure, a History and Physical                            was performed, and patient medications and                            allergies were reviewed. The patient's tolerance of                            previous anesthesia was also reviewed. The risks                            and benefits of the procedure and the sedation                            options and risks were discussed with the patient.                            All questions were answered, and informed consent                            was obtained. Prior Anticoagulants: The patient has                            taken Eliquis (apixaban), last dose was 2 days                            prior to procedure. ASA Grade Assessment: II - A                            patient with mild systemic disease. After reviewing                            the risks and benefits, the patient was deemed in  satisfactory condition to undergo the procedure.                           After obtaining informed consent, the colonoscope                            was passed under direct vision. Throughout the                            procedure, the patient's blood pressure, pulse, and                            oxygen saturations were monitored continuously. The                            Olympus PCF-[id number] (#[id number])  Colonoscope was                            introduced through the anus and advanced to the                            cecum, identified by appendiceal orifice and                            ileocecal valve. The colonoscopy was performed                            without difficulty. The patient tolerated the                            procedure well. The quality of the bowel                            preparation was good. The ileocecal valve,                            appendiceal orifice, and rectum were photographed. Scope In: 2:50:32 PM Scope Out: 3:05:23 PM Scope Withdrawal Time: 0 hours 10 minutes 51 seconds  Total Procedure Duration: 0 hours 14 minutes 51 seconds  Findings:                 The digital rectal exam was normal.                           Many small and large-mouthed diverticula were found                            in the sigmoid colon, descending colon, transverse                            colon, ascending colon and cecum.                           The exam was otherwise without abnormality on  direct and retroflexion views. Complications:            No immediate complications. Estimated Blood Loss:     Estimated blood loss: none. Impression:               - Severe diverticulosis in the sigmoid colon, in                            the descending colon, in the transverse colon, in                            the ascending colon and in the cecum.                           - The examination was otherwise normal on direct                            and retroflexion views.                           - No specimens collected. Recommendation:           - Patient has a contact number available for                            emergencies. The signs and symptoms of potential                            delayed complications were discussed with the                            patient. Return to normal activities tomorrow.                             Written discharge instructions were provided to the                            patient.                           - Resume previous diet.                           - Continue present medications.                           - Resume Eliquis (apixaban) at prior dose today.                            Refer to managing physician for further adjustment                            of therapy.                           - No repeat colonoscopy due to age and the absence  of colonic polyps. Jerene Bears, MD 02/09/2022 3:10:42 PM This report has been signed electronically.

## 2022-02-09 NOTE — Op Note (Signed)
[facility] Patient Name: Sophia [name] Procedure Date: 02/09/2022 2:39 PM MRN: [id number] Endoscopist: Jerene Bears , MD Age: 81 Referring MD:  Date of Birth: Mar 29, 1941 Gender: Female Account #: [id number] Procedure:                Upper GI endoscopy Indications:              Abdominal pain in the right side, Gastro-esophageal                            reflux disease Medicines:                Monitored Anesthesia Care Procedure:                Pre-Anesthesia Assessment:                           - Prior to the procedure, a History and Physical                            was performed, and patient medications and                            allergies were reviewed. The patient's tolerance of                            previous anesthesia was also reviewed. The risks                            and benefits of the procedure and the sedation                            options and risks were discussed with the patient.                            All questions were answered, and informed consent                            was obtained. Prior Anticoagulants: The patient has                            taken Eliquis (apixaban), last dose was 2 days                            prior to procedure. ASA Grade Assessment: II - A                            patient with mild systemic disease. After reviewing                            the risks and benefits, the patient was deemed in                            satisfactory condition to undergo the procedure.  After obtaining informed consent, the endoscope was                            passed under direct vision. Throughout the                            procedure, the patient's blood pressure, pulse, and                            oxygen saturations were monitored continuously. The                            GIF [id number] #[id number] was introduced through the                            mouth, and advanced to the  second part of duodenum.                            The upper GI endoscopy was accomplished without                            difficulty. The patient tolerated the procedure                            well. Scope In: Scope Out: Findings:                 The examined esophagus was normal.                           A 3 cm hiatal hernia was present.                           The gastroesophageal flap valve was visualized                            endoscopically and classified as Hill Grade IV (no                            fold, wide open lumen, hiatal hernia present).                           The entire examined stomach was normal.                           The examined duodenum was normal. Complications:            No immediate complications. Estimated Blood Loss:     Estimated blood loss: none. Impression:               - Normal esophagus.                           - 3 cm hiatal hernia.                           - Normal stomach.                           -  Normal examined duodenum.                           - No specimens collected. Recommendation:           - Patient has a contact number available for                            emergencies. The signs and symptoms of potential                            delayed complications were discussed with the                            patient. Return to normal activities tomorrow.                            Written discharge instructions were provided to the                            patient.                           - Resume previous diet.                           - Continue present medications.                           - See the other procedure note for documentation of                            additional recommendations. Jerene Bears, MD 02/09/2022 3:08:25 PM This report has been signed electronically.

## 2022-02-09 NOTE — Progress Notes (Unsigned)
Sedate, gd SR, tolerated procedure well, VSS, report to RN 

## 2022-02-09 NOTE — Progress Notes (Signed)
Pt's states no medical or surgical changes since previsit or office visit. 

## 2022-02-09 NOTE — Progress Notes (Unsigned)
GASTROENTEROLOGY PROCEDURE H&P NOTE   Primary Care Physician: Mosie Lukes, MD    Reason for Procedure:  Abnormal CT scan of the colon, GERD, right-sided abdominal pain  Plan:    EGD and colonoscopy  Patient is appropriate for endoscopic procedure(s) in the ambulatory ([facility]) setting.  The nature of the procedure, as well as the risks, benefits, and alternatives were carefully and thoroughly reviewed with the patient. Ample time for discussion and questions allowed. The patient understood, was satisfied, and agreed to proceed.     HPI: Sophia [name] is a 81 y.o. female who presents for EGD and colonoscopy.  Medical history as below.  Tolerated the prep.  No recent chest pain or shortness of breath.  No abdominal pain today. Eliquis on hold x2 days  Past Medical History:  Diagnosis Date   Anxiety and depression    Arthritis    hands, knees, etc   Bladder prolapse, female, acquired 09/16/2014   Bowen's disease    Chronic atrial fibrillation (HCC)    Diverticulitis    Diverticulosis    GERD (gastroesophageal reflux disease)    H/O hematuria    for several years, work only revealed kidney stone on right side   Hepatic cyst    Hiatal hernia    History of chicken pox    History of kidney stones    Hyperlipidemia    Hypertension    IBS (irritable bowel syndrome)    Leg cramps, sleep related 04/02/2016   Myalgia 04/25/2017   Osteopenia 06/26/2014   Osteoporosis 06/26/2014   Plantar wart of left foot 10/15/2016   Renal cyst    Right shoulder pain 04/23/2017   SCC (squamous cell carcinoma)    arms   Statin intolerance 09/11/2019    Past Surgical History:  Procedure Laterality Date   CHOLECYSTECTOMY  06/30/2003   LOOP RECORDER INSERTION N/A 04/15/2018   Procedure: LOOP RECORDER INSERTION;  Surgeon: Sanda Klein, MD;  Location: [facility];  Service: Cardiovascular;  Laterality: N/A;   SKIN BIOPSY     multiple   SKIN CANCER EXCISION     forehead    TONSILLECTOMY     age 56    Prior to Admission medications   Medication Sig Start Date End Date Taking? Authorizing Provider  carvedilol (COREG) 25 MG tablet TAKE 1 TABLET(25 MG) BY MOUTH TWICE DAILY WITH A MEAL 02/06/22  Yes Lorretta Harp, MD  diltiazem (CARDIZEM CD) 240 MG 24 hr capsule TAKE 1 CAPSULE(240 MG) BY MOUTH DAILY 05/12/21  Yes Lorretta Harp, MD  estradiol (ESTRACE) 0.1 MG/GM vaginal cream Place 1 Applicatorful vaginally 2 (two) times a week.   Yes [provider]  Hypromellose (ARTIFICIAL TEARS OP) Place 1 drop into both eyes 2 (two) times daily.   Yes [provider]  acetaminophen (TYLENOL) 500 MG tablet Take 1,000 mg by mouth 2 (two) times daily as needed for moderate pain or headache.    [provider]  Calcium Carb-Cholecalciferol (CALCIUM 600 + D PO) Take 1 tablet by mouth daily.    [provider]  Cholecalciferol (VITAMIN D) 50 MCG (2000 UT) tablet Take 1 tablet (2,000 Units total) by mouth daily. 12/15/19   [name], NP  Co-Enzyme Q10 200 MG CAPS Take 200 mg by mouth daily.    [provider]  doxycycline (VIBRA-TABS) 100 MG tablet Take 1 tablet (100 mg total) by mouth 2 (two) times daily. 12/05/21   [name], NP  Arne Cleveland  5 MG TABS tablet TAKE 1 TABLET(5 MG) BY MOUTH TWICE DAILY 11/11/21   Hilty, Nadean Corwin, MD  famotidine (PEPCID) 20 MG tablet Take 1 tablet (20 mg total) by mouth 2 (two) times daily as needed for heartburn or indigestion. Pt takes before bed daily 11/17/21   Caleen Jobs B, NP  pantoprazole (PROTONIX) 40 MG tablet Take 1 tablet (40 mg total) by mouth daily. 01/07/22   Zehr, Laban Emperor, PA-C  Probiotic Product (PROBIOTIC DAILY PO) Take 1 capsule by mouth daily.     [provider]  REPATHA SURECLICK 867 MG/ML SOAJ INJECT 1 DOSE INTO THE SKIN EVERY 14 DAYS 12/01/21   Hilty, Nadean Corwin, MD    Current Outpatient Medications  Medication Sig Dispense Refill   carvedilol (COREG) 25 MG  tablet TAKE 1 TABLET(25 MG) BY MOUTH TWICE DAILY WITH A MEAL 180 tablet 3   diltiazem (CARDIZEM CD) 240 MG 24 hr capsule TAKE 1 CAPSULE(240 MG) BY MOUTH DAILY 90 capsule 3   estradiol (ESTRACE) 0.1 MG/GM vaginal cream Place 1 Applicatorful vaginally 2 (two) times a week.     Hypromellose (ARTIFICIAL TEARS OP) Place 1 drop into both eyes 2 (two) times daily.     acetaminophen (TYLENOL) 500 MG tablet Take 1,000 mg by mouth 2 (two) times daily as needed for moderate pain or headache.     Calcium Carb-Cholecalciferol (CALCIUM 600 + D PO) Take 1 tablet by mouth daily.     Cholecalciferol (VITAMIN D) 50 MCG (2000 UT) tablet Take 1 tablet (2,000 Units total) by mouth daily.     Co-Enzyme Q10 200 MG CAPS Take 200 mg by mouth daily.     doxycycline (VIBRA-TABS) 100 MG tablet Take 1 tablet (100 mg total) by mouth 2 (two) times daily. 20 tablet 0   ELIQUIS 5 MG TABS tablet TAKE 1 TABLET(5 MG) BY MOUTH TWICE DAILY 180 tablet 1   famotidine (PEPCID) 20 MG tablet Take 1 tablet (20 mg total) by mouth 2 (two) times daily as needed for heartburn or indigestion. Pt takes before bed daily 90 tablet 1   pantoprazole (PROTONIX) 40 MG tablet Take 1 tablet (40 mg total) by mouth daily. 30 tablet 5   Probiotic Product (PROBIOTIC DAILY PO) Take 1 capsule by mouth daily.      REPATHA SURECLICK 619 MG/ML SOAJ INJECT 1 DOSE INTO THE SKIN EVERY 14 DAYS 2 mL 11   Current Facility-Administered Medications  Medication Dose Route Frequency Provider Last Rate Last Admin   0.9 %  sodium chloride infusion  500 mL Intravenous Once Lenord Fralix, Lajuan Lines, MD        Allergies as of 02/09/2022 - Review Complete 01/07/2022  Allergen Reaction Noted   Morphine and related Swelling 09/10/2014   Statins Other (See Comments) 06/26/2014   Sulfa antibiotics Swelling and Rash 09/10/2014    Family History  Problem Relation Age of Onset   Heart disease Mother        aortic stenosis   Hyperlipidemia Mother    Hypertension Mother    Heart  attack Mother    Cancer Mother        BCC, SCC, skin   Hypertension Father    Cancer Father        SCC, BCC, skin   Stroke Brother    Heart disease Brother        arrythmia   Hypertension Brother    Hyperlipidemia Brother    Heart disease Maternal Grandfather  MI   Tuberculosis Paternal Grandmother    Cancer Paternal Grandfather        head and neck cancer, chewed tobacco   Hypertension Daughter    Colon polyps Daughter    Diverticulitis Daughter    Hyperlipidemia Daughter    Thyroid disease Daughter    Asthma Daughter    Kidney Stones Daughter    Heart disease Son    Heart attack Son    Birth defects Maternal Uncle    Colon cancer Maternal Uncle    Crohn's disease Niece     Social History   Socioeconomic History   Marital status: Single    Spouse name: Not on file   Number of children: 3   Years of education: Not on file   Highest education level: Not on file  Occupational History   Occupation: retired  Tobacco Use   Smoking status: Never   Smokeless tobacco: Never  Vaping Use   Vaping Use: Never used  Substance and Sexual Activity   Alcohol use: No    Alcohol/week: 0.0 standard drinks of alcohol   Drug use: No   Sexual activity: Never    Comment: divorced, widowed, lives with elderly parents, is the caregiver, no dietary restrictions.   Other Topics Concern   Not on file  Social History Narrative   Not on file   Social Determinants of Health   Financial Resource Strain: Low Risk  (09/01/2021)   Overall Financial Resource Strain (CARDIA)    Difficulty of Paying Living Expenses: Not hard at all  Food Insecurity: No Food Insecurity (09/01/2021)   Hunger Vital Sign    Worried About Running Out of Food in the Last Year: Never true    Ran Out of Food in the Last Year: Never true  Transportation Needs: No Transportation Needs (09/01/2021)   PRAPARE - Hydrologist (Medical): No    Lack of Transportation (Non-Medical): No   Physical Activity: Sufficiently Active (09/01/2021)   Exercise Vital Sign    Days of Exercise per Week: 7 days    Minutes of Exercise per Session: 60 min  Stress: No Stress Concern Present (09/01/2021)   Bouse    Feeling of Stress : Not at all  Social Connections: Moderately Integrated (09/01/2021)   Social Connection and Isolation Panel [NHANES]    Frequency of Communication with Friends and Family: More than three times a week    Frequency of Social Gatherings with Friends and Family: More than three times a week    Attends Religious Services: More than 4 times per year    Active Member of [employer] or Organizations: Yes    Attends Archivist Meetings: More than 4 times per year    Marital Status: Widowed  Intimate Partner Violence: Not At Risk (09/01/2021)   Humiliation, Afraid, Rape, and Kick questionnaire    Fear of Current or Ex-Partner: No    Emotionally Abused: No    Physically Abused: No    Sexually Abused: No    Physical Exam: Vital signs in last 24 hours: '@BP'$  138/78   Pulse 63   Temp (!) 97.5 F (36.4 C)   Ht '4\' 11"'$  (1.499 m)   Wt 135 lb (61.2 kg)   SpO2 99%   BMI 27.27 kg/m  GEN: NAD EYE: Sclerae anicteric ENT: MMM CV: Non-tachycardic Pulm: CTA b/l GI: Soft, NT/ND NEURO:  Alert & Oriented x 3   Ulice Dash  Hilarie Fredrickson, MD [facility]  02/09/2022 2:36 PM

## 2022-02-09 NOTE — Patient Instructions (Addendum)
**Start taking Miralax (1 cap a day) until stool is soft, cut back to 1/2 cap if stool becomes too soft**   YOU HAD AN ENDOSCOPIC PROCEDURE TODAY AT [facility]:   Refer to the procedure report that was given to you for any specific questions about what was found during the examination.  If the procedure report does not answer your questions, please call your gastroenterologist to clarify.  If you requested that your care partner not be given the details of your procedure findings, then the procedure report has been included in a sealed envelope for you to review at your convenience later.  **Handout given on hiatal hernia and diverticulosis**  YOU SHOULD EXPECT: Some feelings of bloating in the abdomen. Passage of more gas than usual.  Walking can help get rid of the air that was put into your GI tract during the procedure and reduce the bloating. If you had a lower endoscopy (such as a colonoscopy or flexible sigmoidoscopy) you may notice spotting of blood in your stool or on the toilet paper. If you underwent a bowel prep for your procedure, you may not have a normal bowel movement for a few days.  Please Note:  You might notice some irritation and congestion in your nose or some drainage.  This is from the oxygen used during your procedure.  There is no need for concern and it should clear up in a day or so.  SYMPTOMS TO REPORT IMMEDIATELY:  Following lower endoscopy (colonoscopy or flexible sigmoidoscopy):  Excessive amounts of blood in the stool  Significant tenderness or worsening of abdominal pains  Swelling of the abdomen that is new, acute  Fever of 100F or higher  Following upper endoscopy (EGD)  Vomiting of blood or coffee ground material  New chest pain or pain under the shoulder blades  Painful or persistently difficult swallowing  New shortness of breath  Fever of 100F or higher  Black, tarry-looking stools  For urgent or emergent issues, a  gastroenterologist can be reached at any hour by calling [phone]. Do not use MyChart messaging for urgent concerns.    DIET:  We do recommend a small meal at first, but then you may proceed to your regular diet.  Drink plenty of fluids but you should avoid alcoholic beverages for 24 hours.  ACTIVITY:  You should plan to take it easy for the rest of today and you should NOT DRIVE or use heavy machinery until tomorrow (because of the sedation medicines used during the test).    FOLLOW UP: Our staff will call the number listed on your records the next business day following your procedure.  We will call around 7:15- 8:00 am to check on you and address any questions or concerns that you may have regarding the information given to you following your procedure. If we do not reach you, we will leave a message.  If you develop any symptoms (ie: fever, flu-like symptoms, shortness of breath, cough etc.) before then, please call [phone].  If you test positive for Covid 19 in the 2 weeks post procedure, please call and report this information to [place].    If any biopsies were taken you will be contacted by phone or by letter within the next 1-3 weeks.  Please call us at [phone] if you have not heard about the biopsies in 3 weeks.    SIGNATURES/CONFIDENTIALITY: You and/or your care partner have signed paperwork which will be entered into your electronic  medical record.  These signatures attest to the fact that that the information above on your After Visit Summary has been reviewed and is understood.  Full responsibility of the confidentiality of this discharge information lies with you and/or your care-partner.  

## 2022-02-10 ENCOUNTER — Telehealth: Payer: Self-pay

## 2022-02-10 NOTE — Telephone Encounter (Signed)
  Follow up Call-     02/09/2022    1:54 PM  Call back number  Post procedure Call Back phone  # [phone]  Permission to leave phone message Yes     Patient questions:  Do you have a fever, pain , or abdominal swelling? No. Pain Score  0 *  Have you tolerated food without any problems? Yes.    Have you been able to return to your normal activities? Yes.    Do you have any questions about your discharge instructions: Diet   No. Medications  No. Follow up visit  No.  Do you have questions or concerns about your Care? No.  Actions: * If pain score is 4 or above: No action needed, pain <4.

## 2022-03-16 ENCOUNTER — Telehealth: Payer: Self-pay | Admitting: Cardiovascular Disease

## 2022-03-16 NOTE — Telephone Encounter (Signed)
Spoke with pt regarding strange feeling that she is has when she is back in a-fib. Pt has been dealing with this over the weekend. Last night pt checked her blood pressure and heart rate 159/103, HR 105. Pt did what she was told to do, which is to take an additional 1/2 dose (12.'5mg'$ ) of carvedilol and went to bed. She felt like it brought her heart rate down but this morning she woke up at 3am feeling strange again. Pt states this is how she usually feels when she is back in a-fib, pt also states that she can feel when her heart rate is up because she is used to it being in the 60s. This morning her blood pressure was 144/88, HR 73. She took her medication as usual and feels ok at the moment. Pt scheduled to see Dr. Gwenlyn Found tomorrow at 11:15am. Pt verbalizes understanding.

## 2022-03-16 NOTE — Telephone Encounter (Signed)
Patient c/o Palpitations:  High priority if patient c/o lightheadedness, shortness of breath, or chest pain  How long have you had palpitations/irregular HR/ Afib? Are you having the symptoms now? Yes   Are you currently experiencing lightheadedness, SOB or CP? Chest heaviness  Do you have a history of afib (atrial fibrillation) or irregular heart rhythm? Yes   Have you checked your BP or HR? (document readings if available): 159/103; 105  Are you experiencing any other symptoms? No

## 2022-03-17 ENCOUNTER — Encounter: Payer: Self-pay | Admitting: Cardiovascular Disease

## 2022-03-17 ENCOUNTER — Ambulatory Visit: Payer: Medicare HMO | Attending: Cardiovascular Disease | Admitting: Cardiovascular Disease

## 2022-03-17 VITALS — BP 142/78 | HR 70 | Ht 59.0 in | Wt 138.2 lb

## 2022-03-17 DIAGNOSIS — R931 Abnormal findings on diagnostic imaging of heart and coronary circulation: Secondary | ICD-10-CM | POA: Diagnosis not present

## 2022-03-17 DIAGNOSIS — E782 Mixed hyperlipidemia: Secondary | ICD-10-CM

## 2022-03-17 DIAGNOSIS — I10 Essential (primary) hypertension: Secondary | ICD-10-CM | POA: Diagnosis not present

## 2022-03-17 DIAGNOSIS — I4819 Other persistent atrial fibrillation: Secondary | ICD-10-CM | POA: Diagnosis not present

## 2022-03-17 DIAGNOSIS — I480 Paroxysmal atrial fibrillation: Secondary | ICD-10-CM

## 2022-03-17 NOTE — Assessment & Plan Note (Signed)
History of PAF on Eliquis open Sophia [name] noninterrupted), high-dose carvedilol and Cardizem see the.  She was awakened at 2 in the morning Saturday morning with chest pressure and irregular heart beat.  She is in A-fib today.  The last time she was clinically in A-fib was over 2 years ago.  There is no instigating cause.  I am going to refer her to the A-fib clinic for consideration of DC cardioversion.  After that she will need a Lexiscan Myoview and a 2D echocardiogram.

## 2022-03-17 NOTE — Assessment & Plan Note (Signed)
History of essential hypertension a blood pressure measured today at 142/78.  She is on carvedilol and Cardizem.

## 2022-03-17 NOTE — Assessment & Plan Note (Signed)
Elevated coronary calcium score of 304 measured 01/24/2020.

## 2022-03-17 NOTE — Assessment & Plan Note (Signed)
History of hyperlipidemia on Repatha with lipid profile performed 09/24/2021 revealing total cholesterol 146, LDL 63 and HDL 53.

## 2022-03-17 NOTE — Patient Instructions (Signed)
Medication Instructions:  Your physician recommends that you continue on your current medications as directed. Please refer to the Current Medication list given to you today.  *If you need a refill on your cardiac medications before your next appointment, please call your pharmacy*   Testing/Procedures: Your physician has requested that you have an echocardiogram. Echocardiography is a painless test that uses sound waves to create images of your heart. It provides your doctor with information about the size and shape of your heart and how well your heart's chambers and valves are working. This procedure takes approximately one hour. There are no restrictions for this procedure. This procedure will be done at [address]   Your physician has requested that you have a lexiscan myoview. For further information please visit [email or web address]. Please follow instruction sheet, as given. This will take place at [place]  How to prepare for your Myocardial Perfusion Test: Do not eat or drink 3 hours prior to your test, except you may have water. Do not consume products containing caffeine (regular or decaffeinated) 12 hours prior to your test. (ex: coffee, chocolate, sodas, tea). Do bring a list of your current medications with you.  If not listed below, you may take your medications as normal. Do wear comfortable clothes (no dresses or overalls) and walking shoes, tennis shoes preferred (No heels or open toe shoes are allowed). Do NOT wear cologne, perfume, aftershave, or lotions (deodorant is allowed). The test will take approximately 3 to 4 hours to complete If these instructions are not followed, your test will have to be rescheduled.   Follow-Up: At [facility], you and your health needs are our priority.  As part of our continuing mission to provide you with exceptional heart care, we have created designated Provider Care Teams.  These Care Teams  include your primary Cardiologist (physician) and Advanced Practice Providers (APPs -  Physician Assistants and Nurse Practitioners) who all work together to provide you with the care you need, when you need it.  We recommend signing up for the patient portal called "MyChart".  Sign up information is provided on this After Visit Summary.  MyChart is used to connect with patients for Virtual Visits (Telemedicine).  Patients are able to view lab/test results, encounter notes, upcoming appointments, etc.  Non-urgent messages can be sent to your provider as well.   To learn more about what you can do with MyChart, go to [email or web address].    Your next appointment:   3 month(s)  The format for your next appointment:   In Person  Provider:   Quay Burow, MD

## 2022-03-17 NOTE — Progress Notes (Signed)
03/17/2022 Sophia [name]   April 25, 1941  [id number]  Primary Physician Mosie Lukes, MD Primary Cardiologist: Lorretta Harp MD FACP, [place], [place], [place]  HPI:  Sophia [name] is a 81 y.o.  mild to moderately overweight divorced Caucasian female mother of 3 children, grandmother of 5 grandchildren who I last saw in the office 04/09/2021.  She is accompanied by her daughter Angus Palms today.  She was referred by the ER for evaluation of chest pain.  Her primary care provider is Dr. [name] .   Risk factors include hyperlipidemia intolerant to statin therapy and family history with mother who had a myocardial infarction at age 44 as well as extensive vascular disease.  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  She is never had a heart attack or stroke.  She has had palpitations evaluated by cardiologist in [place] 7 years ago which time a work-up included stress testing and event monitoring.  She was placed on carvedilol at that time.  She was seen in the emergency room at [facility] on 02/22/2018 with chest pain and palpitations.  Pain began in the early morning hours and got worse.  She thought it was indigestion.  She did feel increasing palpitations as well as shortness of breath.  She says she can also hear her "heartbeat" in her left ear.  Her troponins were negative and her EKG showed no acute changes at that time.   When I saw her in the office on 11/08/2019 she was in A. fib with a ventricular response of 106.  I did add Cardizem CD 120 mg a day.  She was already on Eliquis.  I referred her to the A. fib clinic who saw her on 11/13/2019.  She had converted spontaneously.     Since I saw her 12 months ago she did well until this past Saturday morning when she was awakened with chest pressure and palpitations similar to her prior episodes of PAF.  She is in atrial fibrillation with a controlled ventricular response today in the  office.  She says she still has some vague chest pressure.  She has been on Eliquis uninterrupted.  I am referring her to the A-fib clinic to arrange outpatient DC cardioversion in the very near future.  I did get a coronary calcium score on her 01/24/2020 which was 304.  Her last functional study 03/09/2018 was normal.   Current Meds  Medication Sig   acetaminophen (TYLENOL) 500 MG tablet Take 1,000 mg by mouth 2 (two) times daily as needed for moderate pain or headache.   Calcium Carb-Cholecalciferol (CALCIUM 600 + D PO) Take 1 tablet by mouth daily.   carvedilol (COREG) 25 MG tablet TAKE 1 TABLET(25 MG) BY MOUTH TWICE DAILY WITH A MEAL   Cholecalciferol (VITAMIN D) 50 MCG (2000 UT) tablet Take 1 tablet (2,000 Units total) by mouth daily.   Co-Enzyme Q10 200 MG CAPS Take 200 mg by mouth daily.   diltiazem (CARDIZEM CD) 240 MG 24 hr capsule TAKE 1 CAPSULE(240 MG) BY MOUTH DAILY   ELIQUIS 5 MG TABS tablet TAKE 1 TABLET(5 MG) BY MOUTH TWICE DAILY   estradiol (ESTRACE) 0.1 MG/GM vaginal cream Place 1 Applicatorful vaginally 2 (two) times a week.   Hypromellose (ARTIFICIAL TEARS OP) Place 1 drop into both eyes 2 (two) times daily.   pantoprazole (PROTONIX) 40 MG tablet Take 1 tablet (40 mg total) by mouth daily.  Probiotic Product (PROBIOTIC DAILY PO) Take 1 capsule by mouth daily.    REPATHA SURECLICK 409 MG/ML SOAJ INJECT 1 DOSE INTO THE SKIN EVERY 14 DAYS     Allergies  Allergen Reactions   Morphine And Related Swelling   Statins Other (See Comments)    Myalgias, weakness (atorvastatin, RYR, rosuvastatin, pravastatin)   Sulfa Antibiotics Swelling and Rash    Social History   Socioeconomic History   Marital status: Single    Spouse name: Not on file   Number of children: 3   Years of education: Not on file   Highest education level: Not on file  Occupational History   Occupation: retired  Tobacco Use   Smoking status: Never   Smokeless tobacco: Never  Vaping Use   Vaping Use:  Never used  Substance and Sexual Activity   Alcohol use: No    Alcohol/week: 0.0 standard drinks of alcohol   Drug use: No   Sexual activity: Never    Comment: divorced, widowed, lives with elderly parents, is the caregiver, no dietary restrictions.   Other Topics Concern   Not on file  Social History Narrative   Not on file   Social Determinants of Health   Financial Resource Strain: Low Risk  (09/01/2021)   Overall Financial Resource Strain (CARDIA)    Difficulty of Paying Living Expenses: Not hard at all  Food Insecurity: No Food Insecurity (09/01/2021)   Hunger Vital Sign    Worried About Running Out of Food in the Last Year: Never true    Ran Out of Food in the Last Year: Never true  Transportation Needs: No Transportation Needs (09/01/2021)   PRAPARE - Hydrologist (Medical): No    Lack of Transportation (Non-Medical): No  Physical Activity: Sufficiently Active (09/01/2021)   Exercise Vital Sign    Days of Exercise per Week: 7 days    Minutes of Exercise per Session: 60 min  Stress: No Stress Concern Present (09/01/2021)   [place]    Feeling of Stress : Not at all  Social Connections: Moderately Integrated (09/01/2021)   Social Connection and Isolation Panel [NHANES]    Frequency of Communication with Friends and Family: More than three times a week    Frequency of Social Gatherings with Friends and Family: More than three times a week    Attends Religious Services: More than 4 times per year    Active Member of [employer] or Organizations: Yes    Attends Archivist Meetings: More than 4 times per year    Marital Status: Widowed  Intimate Partner Violence: Not At Risk (09/01/2021)   Humiliation, Afraid, Rape, and Kick questionnaire    Fear of Current or Ex-Partner: No    Emotionally Abused: No    Physically Abused: No    Sexually Abused: No     Review of Systems: General:  negative for chills, fever, night sweats or weight changes.  Cardiovascular: negative for chest pain, dyspnea on exertion, edema, orthopnea, palpitations, paroxysmal nocturnal dyspnea or shortness of breath Dermatological: negative for rash Respiratory: negative for cough or wheezing Urologic: negative for hematuria Abdominal: negative for nausea, vomiting, diarrhea, bright red blood per rectum, melena, or hematemesis Neurologic: negative for visual changes, syncope, or dizziness All other systems reviewed and are otherwise negative except as noted above.    Blood pressure (!) 142/78, pulse 70, height '4\' 11"'$  (1.499 m), weight 138 lb 3.2 oz (62.7  kg), SpO2 96 %.  General appearance: alert and no distress Neck: no adenopathy, no carotid bruit, no JVD, supple, symmetrical, trachea midline, and thyroid not enlarged, symmetric, no tenderness/mass/nodules Lungs: clear to auscultation bilaterally Heart: irregularly irregular rhythm Extremities: extremities normal, atraumatic, no cyanosis or edema Pulses: 2+ and symmetric Skin: Skin color, texture, turgor normal. No rashes or lesions Neurologic: Grossly normal  EKG atrial fibrillation with a ventricular sponsor of 70, low voltage QRS.  I personally reviewed this EKG.  ASSESSMENT AND PLAN:   Hypertension History of essential hypertension a blood pressure measured today at 142/78.  She is on carvedilol and Cardizem.  Hyperlipidemia History of hyperlipidemia on Repatha with lipid profile performed 09/24/2021 revealing total cholesterol 146, LDL 63 and HDL 53.  Elevated coronary artery calcium score Elevated coronary calcium score of 304 measured 01/24/2020.  Paroxysmal atrial fibrillation (HCC) History of PAF on Eliquis open [place]igna [place] noninterrupted), high-dose carvedilol and Cardizem see the.  She was awakened at 2 in the morning Saturday morning with chest pressure and irregular heart beat.  She is in A-fib today.  The last time she was  clinically in A-fib was over 2 years ago.  There is no instigating cause.  I am going to refer her to the A-fib clinic for consideration of DC cardioversion.  After that she will need a Lexiscan Myoview and a 2D echocardiogram.     Lorretta Harp MD [facility] 03/17/2022 11:52 AM

## 2022-03-18 ENCOUNTER — Encounter (HOSPITAL_COMMUNITY): Payer: Self-pay | Admitting: Physician Assistant

## 2022-03-18 ENCOUNTER — Ambulatory Visit (HOSPITAL_COMMUNITY)
Admission: RE | Admit: 2022-03-18 | Discharge: 2022-03-18 | Disposition: A | Discharge status: Home / Self Care | Payer: Medicare HMO | Source: Ambulatory Visit | Attending: Physician Assistant | Admitting: Physician Assistant

## 2022-03-18 VITALS — BP 152/98 | HR 73 | Ht 59.0 in | Wt 139.0 lb

## 2022-03-18 DIAGNOSIS — I4819 Other persistent atrial fibrillation: Secondary | ICD-10-CM | POA: Insufficient documentation

## 2022-03-18 DIAGNOSIS — D6869 Other thrombophilia: Secondary | ICD-10-CM | POA: Insufficient documentation

## 2022-03-18 DIAGNOSIS — E785 Hyperlipidemia, unspecified: Secondary | ICD-10-CM | POA: Insufficient documentation

## 2022-03-18 DIAGNOSIS — I10 Essential (primary) hypertension: Secondary | ICD-10-CM | POA: Insufficient documentation

## 2022-03-18 DIAGNOSIS — Z7901 Long term (current) use of anticoagulants: Secondary | ICD-10-CM | POA: Insufficient documentation

## 2022-03-18 DIAGNOSIS — I480 Paroxysmal atrial fibrillation: Secondary | ICD-10-CM | POA: Diagnosis not present

## 2022-03-18 LAB — CBC
HCT: 37.7 % (ref 36.0–46.0)
Hemoglobin: 12.6 g/dL (ref 12.0–15.0)
MCH: 31.8 pg (ref 26.0–34.0)
MCHC: 33.4 g/dL (ref 30.0–36.0)
MCV: 95.2 fL (ref 80.0–100.0)
Platelets: 161 10*3/uL (ref 150–400)
RBC: 3.96 MIL/uL (ref 3.87–5.11)
RDW: 13.5 % (ref 11.5–15.5)
WBC: 9 10*3/uL (ref 4.0–10.5)
nRBC: 0 % (ref 0.0–0.2)

## 2022-03-18 LAB — BASIC METABOLIC PANEL
Anion gap: 7 (ref 5–15)
BUN: 13 mg/dL (ref 8–23)
CO2: 22 mmol/L (ref 22–32)
Calcium: 8.6 mg/dL — ABNORMAL LOW (ref 8.9–10.3)
Chloride: 110 mmol/L (ref 98–111)
Creatinine, Ser: 0.9 mg/dL (ref 0.44–1.00)
GFR, Estimated: >60 mL/min (ref >60)
Glucose, Bld: 106 mg/dL — ABNORMAL HIGH (ref 70–99)
Potassium: 4.1 mmol/L (ref 3.5–5.1)
Sodium: 139 mmol/L (ref 135–145)

## 2022-03-18 LAB — TSH: TSH: 1.865 u[IU]/mL (ref 0.350–4.500)

## 2022-03-18 NOTE — Progress Notes (Signed)
Primary Care Physician: Mosie Lukes, MD Primary Cardiologist: Dr Gwenlyn Found Primary Electrophysiologist: none Referring Physician: Dr Rushie Goltz is a 80 y.o. female with a history of HTN, HLD, and persistent atrial fibrillation who presents for follow up in the [facility].  The patient was initially diagnosed with atrial fibrillation 05/2018 on her ILR which was placed for near syncope. She was started on Eliquis for a CHADS2VASC score of 4. Patient had done well with no further episodes until 11/03/19 when she started having symptoms of chest discomfort and fatigue. ILR showed persistent afib. She was back in SR on follow up. Carelink reviewed which shows she spontaneously converted to SR on 11/10/19. There were no specific triggers that she could identify. She denies snoring or alcohol use.  Patient seen by Dr Gwenlyn Found on 03/17/22 and was found to be in rate controlled afib with symptoms of fatigue and chest discomfort. She remains in afib. Today. Her symptoms started on 9/15, there were no specific triggers that she could identify.   Today, she denies symptoms of shortness of breath, orthopnea, PND, lower extremity edema, dizziness, presyncope, syncope, snoring, daytime somnolence, bleeding, or neurologic sequela. The patient is tolerating medications without difficulties and is otherwise without complaint today.    Atrial Fibrillation Risk Factors:  she does not have symptoms or diagnosis of sleep apnea. she does not have a history of rheumatic fever. she does not have a history of alcohol use. The patient does not have a history of early familial atrial fibrillation or other arrhythmias.  she has a BMI of Body mass index is 28.07 kg/m.[name] Filed Weights   03/18/22 1402  Weight: 63 kg    Family History  Problem Relation Age of Onset   Heart disease Mother        aortic stenosis   Hyperlipidemia Mother    Hypertension Mother    Heart attack  Mother    Cancer Mother        BCC, SCC, skin   Hypertension Father    Cancer Father        SCC, BCC, skin   Stroke Brother    Heart disease Brother        arrythmia   Hypertension Brother    Hyperlipidemia Brother    Heart disease Maternal Grandfather        MI   Tuberculosis Paternal Grandmother    Cancer Paternal Grandfather        head and neck cancer, chewed tobacco   Hypertension Daughter    Colon polyps Daughter    Diverticulitis Daughter    Hyperlipidemia Daughter    Thyroid disease Daughter    Asthma Daughter    Kidney Stones Daughter    Heart disease Son    Heart attack Son    Birth defects Maternal Uncle    Colon cancer Maternal Uncle    Crohn's disease Niece      Atrial Fibrillation Management history:  Previous antiarrhythmic drugs: none Previous cardioversions: none Previous ablations: none CHADS2VASC score: 4 Anticoagulation history: Eliquis   Past Medical History:  Diagnosis Date   Anxiety and depression    Arthritis    hands, knees, etc   Bladder prolapse, female, acquired 09/16/2014   Bowen's disease    Chronic atrial fibrillation ([place])    Diverticulitis    Diverticulosis    GERD (gastroesophageal reflux disease)    H/O hematuria    for several years, work only revealed  kidney stone on right side   Hepatic cyst    Hiatal hernia    History of chicken pox    History of kidney stones    Hyperlipidemia    Hypertension    IBS (irritable bowel syndrome)    Leg cramps, sleep related 04/02/2016   Myalgia 04/25/2017   Osteopenia 06/26/2014   Osteoporosis 06/26/2014   Plantar wart of left foot 10/15/2016   Renal cyst    Right shoulder pain 04/23/2017   SCC (squamous cell carcinoma)    arms   Statin intolerance 09/11/2019   Past Surgical History:  Procedure Laterality Date   CHOLECYSTECTOMY  06/30/2003   LOOP RECORDER INSERTION N/A 04/15/2018   Procedure: LOOP RECORDER INSERTION;  Surgeon: Sanda Klein, MD;  Location: [facility];  Service: Cardiovascular;  Laterality: N/A;   SKIN BIOPSY     multiple   SKIN CANCER EXCISION     forehead   TONSILLECTOMY     age 29    Current Outpatient Medications  Medication Sig Dispense Refill   acetaminophen (TYLENOL) 500 MG tablet Take 1,000 mg by mouth 2 (two) times daily as needed for moderate pain or headache.     Calcium Carb-Cholecalciferol (CALCIUM 600 + D PO) Take 1 tablet by mouth daily.     carvedilol (COREG) 25 MG tablet TAKE 1 TABLET(25 MG) BY MOUTH TWICE DAILY WITH A MEAL 180 tablet 3   Cholecalciferol (VITAMIN D) 50 MCG (2000 UT) tablet Take 1 tablet (2,000 Units total) by mouth daily.     Co-Enzyme Q10 200 MG CAPS Take 200 mg by mouth daily.     diltiazem (CARDIZEM CD) 240 MG 24 hr capsule TAKE 1 CAPSULE(240 MG) BY MOUTH DAILY 90 capsule 3   ELIQUIS 5 MG TABS tablet TAKE 1 TABLET(5 MG) BY MOUTH TWICE DAILY 180 tablet 1   estradiol (ESTRACE) 0.1 MG/GM vaginal cream Place 1 Applicatorful vaginally 2 (two) times a week.     Hypromellose (ARTIFICIAL TEARS OP) Place 1 drop into both eyes 2 (two) times daily.     pantoprazole (PROTONIX) 40 MG tablet Take 1 tablet (40 mg total) by mouth daily. 30 tablet 5   Probiotic Product (PROBIOTIC DAILY PO) Take 1 capsule by mouth daily.      REPATHA SURECLICK 540 MG/ML SOAJ INJECT 1 DOSE INTO THE SKIN EVERY 14 DAYS 2 mL 11   No current facility-administered medications for this encounter.    Allergies  Allergen Reactions   Morphine And Related Swelling   Statins Other (See Comments)    Myalgias, weakness (atorvastatin, RYR, rosuvastatin, pravastatin)   Sulfa Antibiotics Swelling and Rash    Social History   Socioeconomic History   Marital status: Single    Spouse name: Not on file   Number of children: 3   Years of education: Not on file   Highest education level: Not on file  Occupational History   Occupation: retired  Tobacco Use   Smoking status: Never   Smokeless tobacco: Never   Tobacco comments:     Never smoke 03/18/22  Vaping Use   Vaping Use: Never used  Substance and Sexual Activity   Alcohol use: No    Alcohol/week: 0.0 standard drinks of alcohol   Drug use: No   Sexual activity: Never    Comment: divorced, widowed, lives with elderly parents, is the caregiver, no dietary restrictions.   Other Topics Concern   Not on file  Social History Narrative   Not on  file   Social Determinants of Health   Financial Resource Strain: Low Risk  (09/01/2021)   Overall Financial Resource Strain (CARDIA)    Difficulty of Paying Living Expenses: Not hard at all  Food Insecurity: No Food Insecurity (09/01/2021)   Hunger Vital Sign    Worried About Running Out of Food in the Last Year: Never true    Ran Out of Food in the Last Year: Never true  Transportation Needs: No Transportation Needs (09/01/2021)   PRAPARE - Hydrologist (Medical): No    Lack of Transportation (Non-Medical): No  Physical Activity: Sufficiently Active (09/01/2021)   Exercise Vital Sign    Days of Exercise per Week: 7 days    Minutes of Exercise per Session: 60 min  Stress: No Stress Concern Present (09/01/2021)   [place]    Feeling of Stress : Not at all  Social Connections: Moderately Integrated (09/01/2021)   Social Connection and Isolation Panel [NHANES]    Frequency of Communication with Friends and Family: More than three times a week    Frequency of Social Gatherings with Friends and Family: More than three times a week    Attends Religious Services: More than 4 times per year    Active Member of [employer] or Organizations: Yes    Attends Archivist Meetings: More than 4 times per year    Marital Status: Widowed  Intimate Partner Violence: Not At Risk (09/01/2021)   Humiliation, Afraid, Rape, and Kick questionnaire    Fear of Current or Ex-Partner: No    Emotionally Abused: No    Physically Abused: No    Sexually  Abused: No     ROS- All systems are reviewed and negative except as per the HPI above.  Physical Exam: Vitals:   03/18/22 1402  BP: (!) 152/98  Pulse: 73  Weight: 63 kg  Height: '4\' 11"'$  (1.499 m)     GEN- The patient is a well appearing elderly female, alert and oriented x 3 today.   HEENT-head normocephalic, atraumatic, sclera clear, conjunctiva pink, hearing intact, trachea midline. Lungs- Clear to ausculation bilaterally, normal work of breathing Heart- irregular rate and rhythm, no murmurs, rubs or gallops  GI- soft, NT, ND, + BS Extremities- no clubbing, cyanosis, or edema MS- no significant deformity or atrophy Skin- no rash or lesion Psych- euthymic mood, full affect Neuro- strength and sensation are intact   Wt Readings from Last 3 Encounters:  03/18/22 63 kg  03/17/22 62.7 kg  02/09/22 61.2 kg    EKG today demonstrates  Afib Vent. rate 73 BPM PR interval * ms QRS duration 66 ms QT/QTcB 400/440 ms  Echo 03/02/18 demonstrated  - Left ventricle: The cavity size was normal. Systolic function was    normal. The estimated ejection fraction was in the range of 55%    to 60%. Wall motion was normal; there were no regional wall    motion abnormalities. Left ventricular diastolic function    parameters were normal.  - Aortic valve: Sclerosis without stenosis. There was mild    regurgitation.  - Mitral valve: Calcified annulus. Mildly thickened leaflets .    There was mild regurgitation.  - Atrial septum: No defect or patent foramen ovale was identified.  - Pulmonary arteries: PA peak pressure: 35 mm Hg (S).   Epic records are reviewed at length today  CHA2DS2-VASc Score = 5  The patient's score is based upon:  CHF History: 0 HTN History: 1 Diabetes History: 0 Stroke History: 0 Vascular Disease History: 1 Age Score: 2 Gender Score: 1      ASSESSMENT AND PLAN: 1. Persistent Atrial Fibrillation (ICD10:  I48.19) The patient's CHA2DS2-VASc score is 5,  indicating a 7.2% annual risk of stroke.   Patient remains in afib and is symptomatic despite good rate control.  We discussed rhythm control options today. Will plan for DCCV.  Check bmet/cbc/TSH today.  Continue Coreg 25 mg BID Continue diltiazem 240 mg daily Continue Eliquis 5 mg BID  2. Secondary Hypercoagulable State (ICD10:  D68.69) The patient is at significant risk for stroke/thromboembolism based upon her CHA2DS2-VASc Score of 5.  Continue Apixaban (Eliquis).   3. HTN Mildly elevated today, will reassess in SR.    Follow up in the AF clinic post DCCV.    [facility] [address] [place], [place] [address] [phone] 03/18/2022 2:07 PM

## 2022-03-18 NOTE — H&P (View-Only) (Signed)
Primary Care Physician: Mosie Lukes, MD Primary Cardiologist: Dr Gwenlyn Found Primary Electrophysiologist: none Referring Physician: Dr Rushie Goltz is a 81 y.o. female with a history of HTN, HLD, and persistent atrial fibrillation who presents for follow up in the [facility].  The patient was initially diagnosed with atrial fibrillation 05/2018 on her ILR which was placed for near syncope. She was started on Eliquis for a CHADS2VASC score of 4. Patient had done well with no further episodes until 11/03/19 when she started having symptoms of chest discomfort and fatigue. ILR showed persistent afib. She was back in SR on follow up. [facility] reviewed which shows she spontaneously converted to SR on 11/10/19. There were no specific triggers that she could identify. She denies snoring or alcohol use.  Patient seen by Dr Gwenlyn Found on 03/17/22 and was found to be in rate controlled afib with symptoms of fatigue and chest discomfort. She remains in afib. Today. Her symptoms started on 9/15, there were no specific triggers that she could identify.   Today, she denies symptoms of shortness of breath, orthopnea, PND, lower extremity edema, dizziness, presyncope, syncope, snoring, daytime somnolence, bleeding, or neurologic sequela. The patient is tolerating medications without difficulties and is otherwise without complaint today.    Atrial Fibrillation Risk Factors:  she does not have symptoms or diagnosis of sleep apnea. she does not have a history of rheumatic fever. she does not have a history of alcohol use. The patient does not have a history of early familial atrial fibrillation or other arrhythmias.  she has a BMI of Body mass index is 28.07 kg/m.[name] Filed Weights   03/18/22 1402  Weight: 63 kg    Family History  Problem Relation Age of Onset   Heart disease Mother        aortic stenosis   Hyperlipidemia Mother    Hypertension Mother    Heart attack  Mother    Cancer Mother        BCC, SCC, skin   Hypertension Father    Cancer Father        SCC, BCC, skin   Stroke Brother    Heart disease Brother        arrythmia   Hypertension Brother    Hyperlipidemia Brother    Heart disease Maternal Grandfather        MI   Tuberculosis Paternal Grandmother    Cancer Paternal Grandfather        head and neck cancer, chewed tobacco   Hypertension Daughter    Colon polyps Daughter    Diverticulitis Daughter    Hyperlipidemia Daughter    Thyroid disease Daughter    Asthma Daughter    Kidney Stones Daughter    Heart disease Son    Heart attack Son    Birth defects Maternal Uncle    Colon cancer Maternal Uncle    Crohn's disease Niece      Atrial Fibrillation Management history:  Previous antiarrhythmic drugs: none Previous cardioversions: none Previous ablations: none CHADS2VASC score: 4 Anticoagulation history: Eliquis   Past Medical History:  Diagnosis Date   Anxiety and depression    Arthritis    hands, knees, etc   Bladder prolapse, female, acquired 09/16/2014   Bowen's disease    Chronic atrial fibrillation ([place])    Diverticulitis    Diverticulosis    GERD (gastroesophageal reflux disease)    H/O hematuria    for several years, work only revealed  kidney stone on right side   Hepatic cyst    Hiatal hernia    History of chicken pox    History of kidney stones    Hyperlipidemia    Hypertension    IBS (irritable bowel syndrome)    Leg cramps, sleep related 04/02/2016   Myalgia 04/25/2017   Osteopenia 06/26/2014   Osteoporosis 06/26/2014   Plantar wart of left foot 10/15/2016   Renal cyst    Right shoulder pain 04/23/2017   SCC (squamous cell carcinoma)    arms   Statin intolerance 09/11/2019   Past Surgical History:  Procedure Laterality Date   CHOLECYSTECTOMY  06/30/2003   LOOP RECORDER INSERTION N/A 04/15/2018   Procedure: LOOP RECORDER INSERTION;  Surgeon: Sanda Klein, MD;  Location: [facility];  Service: Cardiovascular;  Laterality: N/A;   SKIN BIOPSY     multiple   SKIN CANCER EXCISION     forehead   TONSILLECTOMY     age 59    Current Outpatient Medications  Medication Sig Dispense Refill   acetaminophen (TYLENOL) 500 MG tablet Take 1,000 mg by mouth 2 (two) times daily as needed for moderate pain or headache.     Calcium Carb-Cholecalciferol (CALCIUM 600 + D PO) Take 1 tablet by mouth daily.     carvedilol (COREG) 25 MG tablet TAKE 1 TABLET(25 MG) BY MOUTH TWICE DAILY WITH A MEAL 180 tablet 3   Cholecalciferol (VITAMIN D) 50 MCG (2000 UT) tablet Take 1 tablet (2,000 Units total) by mouth daily.     Co-Enzyme Q10 200 MG CAPS Take 200 mg by mouth daily.     diltiazem (CARDIZEM CD) 240 MG 24 hr capsule TAKE 1 CAPSULE(240 MG) BY MOUTH DAILY 90 capsule 3   ELIQUIS 5 MG TABS tablet TAKE 1 TABLET(5 MG) BY MOUTH TWICE DAILY 180 tablet 1   estradiol (ESTRACE) 0.1 MG/GM vaginal cream Place 1 Applicatorful vaginally 2 (two) times a week.     Hypromellose (ARTIFICIAL TEARS OP) Place 1 drop into both eyes 2 (two) times daily.     pantoprazole (PROTONIX) 40 MG tablet Take 1 tablet (40 mg total) by mouth daily. 30 tablet 5   Probiotic Product (PROBIOTIC DAILY PO) Take 1 capsule by mouth daily.      REPATHA SURECLICK 751 MG/ML SOAJ INJECT 1 DOSE INTO THE SKIN EVERY 14 DAYS 2 mL 11   No current facility-administered medications for this encounter.    Allergies  Allergen Reactions   Morphine And Related Swelling   Statins Other (See Comments)    Myalgias, weakness (atorvastatin, RYR, rosuvastatin, pravastatin)   Sulfa Antibiotics Swelling and Rash    Social History   Socioeconomic History   Marital status: Single    Spouse name: Not on file   Number of children: 3   Years of education: Not on file   Highest education level: Not on file  Occupational History   Occupation: retired  Tobacco Use   Smoking status: Never   Smokeless tobacco: Never   Tobacco comments:     Never smoke 03/18/22  Vaping Use   Vaping Use: Never used  Substance and Sexual Activity   Alcohol use: No    Alcohol/week: 0.0 standard drinks of alcohol   Drug use: No   Sexual activity: Never    Comment: divorced, widowed, lives with elderly parents, is the caregiver, no dietary restrictions.   Other Topics Concern   Not on file  Social History Narrative   Not on  file   Social Determinants of Health   Financial Resource Strain: Low Risk  (09/01/2021)   Overall Financial Resource Strain (CARDIA)    Difficulty of Paying Living Expenses: Not hard at all  Food Insecurity: No Food Insecurity (09/01/2021)   Hunger Vital Sign    Worried About Running Out of Food in the Last Year: Never true    Ran Out of Food in the Last Year: Never true  Transportation Needs: No Transportation Needs (09/01/2021)   PRAPARE - Hydrologist (Medical): No    Lack of Transportation (Non-Medical): No  Physical Activity: Sufficiently Active (09/01/2021)   Exercise Vital Sign    Days of Exercise per Week: 7 days    Minutes of Exercise per Session: 60 min  Stress: No Stress Concern Present (09/01/2021)   [place]    Feeling of Stress : Not at all  Social Connections: Moderately Integrated (09/01/2021)   Social Connection and Isolation Panel [NHANES]    Frequency of Communication with Friends and Family: More than three times a week    Frequency of Social Gatherings with Friends and Family: More than three times a week    Attends Religious Services: More than 4 times per year    Active Member of [employer] or Organizations: Yes    Attends Archivist Meetings: More than 4 times per year    Marital Status: Widowed  Intimate Partner Violence: Not At Risk (09/01/2021)   Humiliation, Afraid, Rape, and Kick questionnaire    Fear of Current or Ex-Partner: No    Emotionally Abused: No    Physically Abused: No    Sexually  Abused: No     ROS- All systems are reviewed and negative except as per the HPI above.  Physical Exam: Vitals:   03/18/22 1402  BP: (!) 152/98  Pulse: 73  Weight: 63 kg  Height: '4\' 11"'$  (1.499 m)     GEN- The patient is a well appearing elderly female, alert and oriented x 3 today.   HEENT-head normocephalic, atraumatic, sclera clear, conjunctiva pink, hearing intact, trachea midline. Lungs- Clear to ausculation bilaterally, normal work of breathing Heart- irregular rate and rhythm, no murmurs, rubs or gallops  GI- soft, NT, ND, + BS Extremities- no clubbing, cyanosis, or edema MS- no significant deformity or atrophy Skin- no rash or lesion Psych- euthymic mood, full affect Neuro- strength and sensation are intact   Wt Readings from Last 3 Encounters:  03/18/22 63 kg  03/17/22 62.7 kg  02/09/22 61.2 kg    EKG today demonstrates  Afib Vent. rate 73 BPM PR interval * ms QRS duration 66 ms QT/QTcB 400/440 ms  Echo 03/02/18 demonstrated  - Left ventricle: The cavity size was normal. Systolic function was    normal. The estimated ejection fraction was in the range of 55%    to 60%. Wall motion was normal; there were no regional wall    motion abnormalities. Left ventricular diastolic function    parameters were normal.  - Aortic valve: Sclerosis without stenosis. There was mild    regurgitation.  - Mitral valve: Calcified annulus. Mildly thickened leaflets .    There was mild regurgitation.  - Atrial septum: No defect or patent foramen ovale was identified.  - Pulmonary arteries: PA peak pressure: 35 mm Hg (S).   Epic records are reviewed at length today  CHA2DS2-VASc Score = 5  The patient's score is based upon:  CHF History: 0 HTN History: 1 Diabetes History: 0 Stroke History: 0 Vascular Disease History: 1 Age Score: 2 Gender Score: 1      ASSESSMENT AND PLAN: 1. Persistent Atrial Fibrillation (ICD10:  I48.19) The patient's CHA2DS2-VASc score is 5,  indicating a 7.2% annual risk of stroke.   Patient remains in afib and is symptomatic despite good rate control.  We discussed rhythm control options today. Will plan for DCCV.  Check bmet/cbc/TSH today.  Continue Coreg 25 mg BID Continue diltiazem 240 mg daily Continue Eliquis 5 mg BID  2. Secondary Hypercoagulable State (ICD10:  D68.69) The patient is at significant risk for stroke/thromboembolism based upon her CHA2DS2-VASc Score of 5.  Continue Apixaban (Eliquis).   3. HTN Mildly elevated today, will reassess in SR.    Follow up in the AF clinic post DCCV.    [facility] [address] [place], [place] [address] [phone] 03/18/2022 2:07 PM

## 2022-03-18 NOTE — Patient Instructions (Signed)
Cardioversion scheduled for Friday, September 29th  - Arrive at the [facility] and go to admitting at 930am  - Do not eat or drink anything after midnight the night prior to your procedure.  - Take all your morning medication (except diabetic medications) with a sip of water prior to arrival.  - You will not be able to drive home after your procedure.  - Do NOT miss any doses of your blood thinner - if you should miss a dose please notify our office immediately.  - If you feel as if you go back into normal rhythm prior to scheduled cardioversion, please notify our office immediately. If your procedure is canceled in the cardioversion suite you will be charged a cancellation fee.

## 2022-03-19 NOTE — Addendum Note (Signed)
Addended by: Jacqulynn Cadet on: 03/19/2022 10:13 AM   Modules accepted: Orders

## 2022-03-26 ENCOUNTER — Ambulatory Visit (HOSPITAL_COMMUNITY)
Admission: RE | Admit: 2022-03-26 | Discharge: 2022-03-26 | Disposition: A | Discharge status: Home / Self Care | Payer: Medicare HMO | Source: Ambulatory Visit | Attending: Physician Assistant | Admitting: Physician Assistant

## 2022-03-26 DIAGNOSIS — I4891 Unspecified atrial fibrillation: Secondary | ICD-10-CM | POA: Insufficient documentation

## 2022-03-26 DIAGNOSIS — I4819 Other persistent atrial fibrillation: Secondary | ICD-10-CM

## 2022-03-26 NOTE — Progress Notes (Signed)
Patient returns for ECG. She felt she may have gone back into SR. ECG shows afib HR 69, QRS 64, QTc 432. Will plan for DCCV and follow up as scheduled.

## 2022-03-27 ENCOUNTER — Ambulatory Visit (HOSPITAL_COMMUNITY)
Admission: RE | Admit: 2022-03-27 | Discharge: 2022-03-27 | Disposition: A | Discharge status: Home / Self Care | Payer: Medicare HMO | Attending: Cardiology | Admitting: Cardiology

## 2022-03-27 ENCOUNTER — Encounter (HOSPITAL_COMMUNITY): Payer: Self-pay | Admitting: Cardiology

## 2022-03-27 ENCOUNTER — Other Ambulatory Visit: Payer: Self-pay

## 2022-03-27 ENCOUNTER — Ambulatory Visit (HOSPITAL_COMMUNITY): Payer: Medicare HMO | Admitting: Certified Registered Nurse Anesthetist

## 2022-03-27 ENCOUNTER — Encounter (HOSPITAL_COMMUNITY)
Admission: RE | Disposition: A | Discharge status: Home / Self Care | Payer: Self-pay | Source: Home / Self Care | Attending: Cardiology

## 2022-03-27 ENCOUNTER — Ambulatory Visit (HOSPITAL_BASED_OUTPATIENT_CLINIC_OR_DEPARTMENT_OTHER): Payer: Medicare HMO | Admitting: Certified Registered Nurse Anesthetist

## 2022-03-27 DIAGNOSIS — K449 Diaphragmatic hernia without obstruction or gangrene: Secondary | ICD-10-CM | POA: Diagnosis not present

## 2022-03-27 DIAGNOSIS — K219 Gastro-esophageal reflux disease without esophagitis: Secondary | ICD-10-CM | POA: Insufficient documentation

## 2022-03-27 DIAGNOSIS — I10 Essential (primary) hypertension: Secondary | ICD-10-CM | POA: Diagnosis not present

## 2022-03-27 DIAGNOSIS — F32A Depression, unspecified: Secondary | ICD-10-CM | POA: Insufficient documentation

## 2022-03-27 DIAGNOSIS — I4891 Unspecified atrial fibrillation: Secondary | ICD-10-CM

## 2022-03-27 DIAGNOSIS — I2510 Atherosclerotic heart disease of native coronary artery without angina pectoris: Secondary | ICD-10-CM | POA: Diagnosis not present

## 2022-03-27 DIAGNOSIS — E785 Hyperlipidemia, unspecified: Secondary | ICD-10-CM | POA: Diagnosis not present

## 2022-03-27 DIAGNOSIS — M1990 Unspecified osteoarthritis, unspecified site: Secondary | ICD-10-CM | POA: Insufficient documentation

## 2022-03-27 DIAGNOSIS — F419 Anxiety disorder, unspecified: Secondary | ICD-10-CM | POA: Insufficient documentation

## 2022-03-27 DIAGNOSIS — F418 Other specified anxiety disorders: Secondary | ICD-10-CM | POA: Diagnosis not present

## 2022-03-27 DIAGNOSIS — I4819 Other persistent atrial fibrillation: Secondary | ICD-10-CM | POA: Diagnosis not present

## 2022-03-27 DIAGNOSIS — R519 Headache, unspecified: Secondary | ICD-10-CM | POA: Diagnosis not present

## 2022-03-27 HISTORY — PX: CARDIOVERSION: SHX1299

## 2022-03-27 SURGERY — CARDIOVERSION
Anesthesia: General

## 2022-03-27 MED ORDER — LIDOCAINE 2% (20 MG/ML) 5 ML SYRINGE
INTRAMUSCULAR | Status: DC | PRN
  Administered 2022-03-27: 60 mg via INTRAVENOUS

## 2022-03-27 MED ORDER — SODIUM CHLORIDE 0.9 % IV SOLN: INTRAVENOUS | Status: DC

## 2022-03-27 MED ORDER — PROPOFOL 10 MG/ML IV BOLUS
INTRAVENOUS | Status: DC | PRN
  Administered 2022-03-27: 50 mg via INTRAVENOUS

## 2022-03-27 NOTE — CV Procedure (Signed)
Procedure:   DCCV  Indication:  Symptomatic atrial fibrillation  Procedure Note:  The patient signed informed consent.  They have had had therapeutic anticoagulation with apixaban greater than 3 weeks.  Anesthesia was administered by Dr. Gloris Manchester.  Patient received 60 mg IV lidocaine and 50 mg IV propofol.Adequate airway was maintained throughout and vital followed per protocol.  They were cardioverted x 1 with 120J of biphasic synchronized energy.  They converted to NSR.  There were no apparent complications.  The patient had normal neuro status and respiratory status post procedure with vitals stable as recorded elsewhere.    Follow up:  They will continue on current medical therapy and follow up with cardiology as scheduled.  Buford Dresser, MD PhD 03/27/2022 10:10 AM

## 2022-03-27 NOTE — Anesthesia Preprocedure Evaluation (Signed)
Anesthesia Evaluation  Patient identified by MRN, date of birth, ID band Patient awake    Reviewed: Allergy & Precautions, NPO status , Patient's Chart, lab work & pertinent test results  Airway Mallampati: II  TM Distance: >3 FB Neck ROM: Full    Dental no notable dental hx.    Pulmonary neg pulmonary ROS,    Pulmonary exam normal        Cardiovascular hypertension, + CAD  + dysrhythmias Atrial Fibrillation  Rhythm:Irregular Rate:Normal     Neuro/Psych  Headaches, Anxiety Depression    GI/Hepatic Neg liver ROS, hiatal hernia, GERD  ,  Endo/Other  negative endocrine ROS  Renal/GU   negative genitourinary   Musculoskeletal  (+) Arthritis , Osteoarthritis,    Abdominal Normal abdominal exam  (+)   Peds  Hematology negative hematology ROS (+)   Anesthesia Other Findings   Reproductive/Obstetrics                             Anesthesia Physical Anesthesia Plan  ASA: 3  Anesthesia Plan: General   Post-op Pain Management:    Induction:   PONV Risk Score and Plan: 3 and Treatment may vary due to age or medical condition  Airway Management Planned: Mask  Additional Equipment: None  Intra-op Plan:   Post-operative Plan:   Informed Consent: I have reviewed the patients History and Physical, chart, labs and discussed the procedure including the risks, benefits and alternatives for the proposed anesthesia with the patient or authorized representative who has indicated his/her understanding and acceptance.     Dental advisory given  Plan Discussed with:   Anesthesia Plan Comments:         Anesthesia Quick Evaluation

## 2022-03-27 NOTE — Anesthesia Postprocedure Evaluation (Signed)
Anesthesia Post Note  Patient: Sophia [name]  Procedure(s) Performed: CARDIOVERSION     Patient location during evaluation: PACU Anesthesia Type: General Level of consciousness: awake and alert Pain management: pain level controlled Vital Signs Assessment: post-procedure vital signs reviewed and stable Respiratory status: spontaneous breathing, nonlabored ventilation, respiratory function stable and patient connected to nasal cannula oxygen Cardiovascular status: blood pressure returned to baseline and stable Postop Assessment: no apparent nausea or vomiting Anesthetic complications: no   No notable events documented.  Last Vitals:  Vitals:   03/27/22 1030 03/27/22 1040  BP: 134/71 135/73  Pulse: 61 62  Resp: 10 12  Temp:    SpO2: 97% 98%    Last Pain:  Vitals:   03/27/22 1020  TempSrc:   PainSc: 0-No pain                 Belenda Cruise P Ocie Tino

## 2022-03-27 NOTE — Interval H&P Note (Signed)
History and Physical Interval Note:  03/27/2022 10:10 AM  [name]  has presented today for surgery, with the diagnosis of atrial fibrillation.  The various methods of treatment have been discussed with the patient and family. After consideration of risks, benefits and other options for treatment, the patient has consented to  Procedure(s): CARDIOVERSION (N/A) as a surgical intervention.  The patient's history has been reviewed, patient examined, no change in status, stable for surgery.  I have reviewed the patient's chart and labs.  Questions were answered to the patient's satisfaction.     Kensleigh Gates Harrell Gave

## 2022-03-27 NOTE — Transfer of Care (Signed)
Immediate Anesthesia Transfer of Care Note  Patient: ANAMAE ROCHELLE  Procedure(s) Performed: CARDIOVERSION  Patient Location: PACU and Endoscopy Unit  Anesthesia Type:General  Level of Consciousness: drowsy, patient cooperative and responds to stimulation  Airway & Oxygen Therapy: Patient Spontanous Breathing  Post-op Assessment: Report given to RN and Post -op Vital signs reviewed and stable  Post vital signs: Reviewed and stable  Last Vitals:  Vitals Value Taken Time  BP    Temp    Pulse    Resp    SpO2      Last Pain:  Vitals:   03/27/22 0948  TempSrc: Temporal  PainSc: 0-No pain         Complications: No notable events documented.

## 2022-03-27 NOTE — Anesthesia Procedure Notes (Signed)
Procedure Name: General with mask airway Date/Time: 03/27/2022 10:05 AM  Performed by: Janace Litten, CRNAPre-anesthesia Checklist: Patient identified, Emergency Drugs available, Suction available and Patient being monitored Patient Re-evaluated:Patient Re-evaluated prior to induction Oxygen Delivery Method: Ambu bag Preoxygenation: Pre-oxygenation with 100% oxygen Induction Type: IV induction

## 2022-03-29 ENCOUNTER — Encounter (HOSPITAL_COMMUNITY): Payer: Self-pay | Admitting: Cardiology

## 2022-03-30 ENCOUNTER — Ambulatory Visit (HOSPITAL_COMMUNITY): Payer: Medicare HMO | Attending: Cardiovascular Disease

## 2022-03-30 DIAGNOSIS — E782 Mixed hyperlipidemia: Secondary | ICD-10-CM | POA: Diagnosis not present

## 2022-03-30 DIAGNOSIS — R931 Abnormal findings on diagnostic imaging of heart and coronary circulation: Secondary | ICD-10-CM | POA: Diagnosis not present

## 2022-03-30 DIAGNOSIS — I480 Paroxysmal atrial fibrillation: Secondary | ICD-10-CM | POA: Diagnosis not present

## 2022-03-30 DIAGNOSIS — I4819 Other persistent atrial fibrillation: Secondary | ICD-10-CM

## 2022-03-30 DIAGNOSIS — I10 Essential (primary) hypertension: Secondary | ICD-10-CM

## 2022-03-30 LAB — ECHOCARDIOGRAM COMPLETE
Area-P 1/2: 3.26 cm2
S' Lateral: 2.8 cm

## 2022-04-03 ENCOUNTER — Ambulatory Visit (HOSPITAL_COMMUNITY)
Admission: RE | Admit: 2022-04-03 | Payer: Medicare HMO | Source: Ambulatory Visit | Attending: Cardiovascular Disease | Admitting: Cardiovascular Disease

## 2022-04-06 ENCOUNTER — Ambulatory Visit (HOSPITAL_COMMUNITY)
Admission: RE | Admit: 2022-04-06 | Discharge: 2022-04-06 | Disposition: A | Discharge status: Home / Self Care | Payer: Medicare HMO | Source: Ambulatory Visit | Attending: Physician Assistant | Admitting: Physician Assistant

## 2022-04-06 ENCOUNTER — Encounter (HOSPITAL_COMMUNITY): Payer: Self-pay | Admitting: Nurse Practitioner

## 2022-04-06 VITALS — BP 150/90 | HR 65 | Wt 137.0 lb

## 2022-04-06 DIAGNOSIS — Z7901 Long term (current) use of anticoagulants: Secondary | ICD-10-CM | POA: Diagnosis not present

## 2022-04-06 DIAGNOSIS — E785 Hyperlipidemia, unspecified: Secondary | ICD-10-CM | POA: Insufficient documentation

## 2022-04-06 DIAGNOSIS — I10 Essential (primary) hypertension: Secondary | ICD-10-CM | POA: Insufficient documentation

## 2022-04-06 DIAGNOSIS — I4819 Other persistent atrial fibrillation: Secondary | ICD-10-CM

## 2022-04-06 DIAGNOSIS — D6869 Other thrombophilia: Secondary | ICD-10-CM | POA: Diagnosis not present

## 2022-04-06 NOTE — Progress Notes (Signed)
Primary Care Physician: Mosie Lukes, MD Primary Cardiologist: Dr Gwenlyn Found Primary Electrophysiologist: none Referring Physician: Dr Rushie Goltz is a 81 y.o. female with a history of HTN, HLD, and persistent atrial fibrillation who presents for follow up in the [facility].  The patient was initially diagnosed with atrial fibrillation 05/2018 on her ILR which was placed for near syncope. She was started on Eliquis for a CHADS2VASC score of 4. Patient had done well with no further episodes until 11/03/19 when she started having symptoms of chest discomfort and fatigue. ILR showed persistent afib. She was back in SR on follow up. Carelink reviewed which shows she spontaneously converted to SR on 11/10/19. There were no specific triggers that she could identify. She denies snoring or alcohol use.  Patient seen by Dr Gwenlyn Found on 03/17/22 and was found to be in rate controlled afib with symptoms of fatigue and chest discomfort. She remains in afib. [name] Her symptoms started on 9/15, there were no specific triggers that she could identify.   She returns to the afib clinic, 04/06/22 after having successful  cardioversion 03/27/22. She remains in SR. She feels improved. She is pending a lexiscan  11/3. She was having some chest pain at time of onset of afib but this has resolved.   Today, she denies symptoms of shortness of breath, orthopnea, PND, lower extremity edema, dizziness, presyncope, syncope, snoring, daytime somnolence, bleeding, or neurologic sequela. The patient is tolerating medications without difficulties and is otherwise without complaint today.    Atrial Fibrillation Risk Factors:  she does not have symptoms or diagnosis of sleep apnea. she does not have a history of rheumatic fever. she does not have a history of alcohol use. The patient does not have a history of early familial atrial fibrillation or other arrhythmias.  she has a BMI of Body mass  index is 27.67 kg/m.[name] Filed Weights   04/06/22 1334  Weight: 62.1 kg    Family History  Problem Relation Age of Onset   Heart disease Mother        aortic stenosis   Hyperlipidemia Mother    Hypertension Mother    Heart attack Mother    Cancer Mother        BCC, SCC, skin   Hypertension Father    Cancer Father        SCC, BCC, skin   Stroke Brother    Heart disease Brother        arrythmia   Hypertension Brother    Hyperlipidemia Brother    Heart disease Maternal Grandfather        MI   Tuberculosis Paternal Grandmother    Cancer Paternal Grandfather        head and neck cancer, chewed tobacco   Hypertension Daughter    Colon polyps Daughter    Diverticulitis Daughter    Hyperlipidemia Daughter    Thyroid disease Daughter    Asthma Daughter    Kidney Stones Daughter    Heart disease Son    Heart attack Son    Birth defects Maternal Uncle    Colon cancer Maternal Uncle    Crohn's disease Niece      Atrial Fibrillation Management history:  Previous antiarrhythmic drugs: none Previous cardioversions: none Previous ablations: none CHADS2VASC score: 4 Anticoagulation history: Eliquis   Past Medical History:  Diagnosis Date   Anxiety and depression    Arthritis    hands, knees, etc  Bladder prolapse, female, acquired 09/16/2014   Bowen's disease    Chronic atrial fibrillation (HCC)    Diverticulitis    Diverticulosis    GERD (gastroesophageal reflux disease)    H/O hematuria    for several years, work only revealed kidney stone on right side   Hepatic cyst    Hiatal hernia    History of chicken pox    History of kidney stones    Hyperlipidemia    Hypertension    IBS (irritable bowel syndrome)    Leg cramps, sleep related 04/02/2016   Myalgia 04/25/2017   Osteopenia 06/26/2014   Osteoporosis 06/26/2014   Plantar wart of left foot 10/15/2016   Renal cyst    Right shoulder pain 04/23/2017   SCC (squamous cell carcinoma)    arms   Statin  intolerance 09/11/2019   Past Surgical History:  Procedure Laterality Date   CARDIOVERSION N/A 03/27/2022   Procedure: CARDIOVERSION;  Surgeon: Buford Dresser, MD;  Location: [facility];  Service: Cardiovascular;  Laterality: N/A;   CHOLECYSTECTOMY  06/30/2003   LOOP RECORDER INSERTION N/A 04/15/2018   Procedure: LOOP RECORDER INSERTION;  Surgeon: Sanda Klein, MD;  Location: [facility];  Service: Cardiovascular;  Laterality: N/A;   SKIN BIOPSY     multiple   SKIN CANCER EXCISION     forehead   TONSILLECTOMY     age 74    Current Outpatient Medications  Medication Sig Dispense Refill   acetaminophen (TYLENOL) 500 MG tablet Take 1,000 mg by mouth 2 (two) times daily as needed for moderate pain or headache.     Calcium Carb-Cholecalciferol (CALCIUM 600 + D PO) Take 1 tablet by mouth daily.     carvedilol (COREG) 25 MG tablet TAKE 1 TABLET(25 MG) BY MOUTH TWICE DAILY WITH A MEAL 180 tablet 3   Cholecalciferol (VITAMIN D) 50 MCG (2000 UT) tablet Take 1 tablet (2,000 Units total) by mouth daily.     Co-Enzyme Q10 200 MG CAPS Take 200 mg by mouth daily.     diltiazem (CARDIZEM CD) 240 MG 24 hr capsule TAKE 1 CAPSULE(240 MG) BY MOUTH DAILY 90 capsule 3   ELIQUIS 5 MG TABS tablet TAKE 1 TABLET(5 MG) BY MOUTH TWICE DAILY 180 tablet 1   estradiol (ESTRACE) 0.1 MG/GM vaginal cream Place 1 Applicatorful vaginally 2 (two) times a week.     Hypromellose (ARTIFICIAL TEARS OP) Place 1 drop into both eyes as needed (dry eyes).     pantoprazole (PROTONIX) 40 MG tablet Take 1 tablet (40 mg total) by mouth daily. 30 tablet 5   Probiotic Product (PROBIOTIC DAILY PO) Take 1 capsule by mouth daily.      REPATHA SURECLICK 419 MG/ML SOAJ INJECT 1 DOSE INTO THE SKIN EVERY 14 DAYS 2 mL 11   No current facility-administered medications for this encounter.    Allergies  Allergen Reactions   Morphine And Related Swelling   Statins Other (See Comments)    Myalgias, weakness (atorvastatin,  RYR, rosuvastatin, pravastatin)   Sulfa Antibiotics Swelling and Rash    Social History   Socioeconomic History   Marital status: Single    Spouse name: Not on file   Number of children: 3   Years of education: Not on file   Highest education level: Not on file  Occupational History   Occupation: retired  Tobacco Use   Smoking status: Never   Smokeless tobacco: Never   Tobacco comments:    Never smoke 03/18/22  Vaping Use  Vaping Use: Never used  Substance and Sexual Activity   Alcohol use: No    Alcohol/week: 0.0 standard drinks of alcohol   Drug use: No   Sexual activity: Never    Comment: divorced, widowed, lives with elderly parents, is the caregiver, no dietary restrictions.   Other Topics Concern   Not on file  Social History Narrative   Not on file   Social Determinants of Health   Financial Resource Strain: Low Risk  (09/01/2021)   Overall Financial Resource Strain (CARDIA)    Difficulty of Paying Living Expenses: Not hard at all  Food Insecurity: No Food Insecurity (09/01/2021)   Hunger Vital Sign    Worried About Running Out of Food in the Last Year: Never true    Ran Out of Food in the Last Year: Never true  Transportation Needs: No Transportation Needs (09/01/2021)   PRAPARE - Hydrologist (Medical): No    Lack of Transportation (Non-Medical): No  Physical Activity: Sufficiently Active (09/01/2021)   Exercise Vital Sign    Days of Exercise per Week: 7 days    Minutes of Exercise per Session: 60 min  Stress: No Stress Concern Present (09/01/2021)   [place]    Feeling of Stress : Not at all  Social Connections: Moderately Integrated (09/01/2021)   Social Connection and Isolation Panel [NHANES]    Frequency of Communication with Friends and Family: More than three times a week    Frequency of Social Gatherings with Friends and Family: More than three times a week     Attends Religious Services: More than 4 times per year    Active Member of [employer] or Organizations: Yes    Attends Archivist Meetings: More than 4 times per year    Marital Status: Widowed  Intimate Partner Violence: Not At Risk (09/01/2021)   Humiliation, Afraid, Rape, and Kick questionnaire    Fear of Current or Ex-Partner: No    Emotionally Abused: No    Physically Abused: No    Sexually Abused: No     ROS- All systems are reviewed and negative except as per the HPI above.  Physical Exam: Vitals:   04/06/22 1334  BP: (!) 150/90  Pulse: 65  Weight: 62.1 kg     GEN- The patient is a well appearing elderly female, alert and oriented x 3 today.   HEENT-head normocephalic, atraumatic, sclera clear, conjunctiva pink, hearing intact, trachea midline. Lungs- Clear to ausculation bilaterally, normal work of breathing Heart- regular rate and rhythm, no murmurs, rubs or gallops  GI- soft, NT, ND, + BS Extremities- no clubbing, cyanosis, or edema MS- no significant deformity or atrophy Skin- no rash or lesion Psych- euthymic mood, full affect Neuro- strength and sensation are intact   Wt Readings from Last 3 Encounters:  04/06/22 62.1 kg  03/27/22 62.6 kg  03/18/22 63 kg    EKG today demonstrates  Vent. rate 65 BPM PR interval 206 ms QRS duration 70 ms QT/QTcB 436/453 ms P-R-T axes 36 -17 -6 Normal sinus rhythm Low voltage QRS Possible Anterolateral infarct , age undetermined Abnormal ECG When compared with ECG of 27-Mar-2022 10:12, PREVIOUS ECG IS PRESENT  Echo 03/02/18 demonstrated  - Left ventricle: The cavity size was normal. Systolic function was    normal. The estimated ejection fraction was in the range of 55%    to 60%. Wall motion was normal; there were no regional wall  motion abnormalities. Left ventricular diastolic function    parameters were normal.  - Aortic valve: Sclerosis without stenosis. There was mild    regurgitation.  - Mitral  valve: Calcified annulus. Mildly thickened leaflets .    There was mild regurgitation.  - Atrial septum: No defect or patent foramen ovale was identified.  - Pulmonary arteries: PA peak pressure: 35 mm Hg (S).   Epic records are reviewed at length today  CHA2DS2-VASc Score = 5  The patient's score is based upon: CHF History: 0 HTN History: 1 Diabetes History: 0 Stroke History: 0 Vascular Disease History: 1 Age Score: 2 Gender Score: 1      ASSESSMENT AND PLAN: 1. Persistent Atrial Fibrillation (ICD10:  I48.19) The patient's CHA2DS2-VASc score is 5, indicating a 7.2% annual risk of stroke.   She had a successful cardioversion and remains in SR Continue Coreg 25 mg BID Continue diltiazem 240 mg daily Continue Eliquis 5 mg BID  2. Secondary Hypercoagulable State (ICD10:  D68.69) The patient is at significant risk for stroke/thromboembolism based upon her CHA2DS2-VASc Score of 5.  Continue Apixaban (Eliquis).   3. HTN Mildly elevated today, at home BP readings are well controlled    Follow up with scheduled cardioversion and f/u with Dr. Gwenlyn Found 05/2022  Geroge Baseman. [facility] [address] [place], [place] [address] [phone]  2:00 PM

## 2022-04-27 ENCOUNTER — Telehealth (HOSPITAL_COMMUNITY): Payer: Self-pay | Admitting: *Deleted

## 2022-04-27 NOTE — Telephone Encounter (Signed)
Patient given detailed instructions per Myocardial Perfusion Study Information Sheet for the test on 05/01/22 Patient notified to arrive 15 minutes early and that it is imperative to arrive on time for appointment to keep from having the test rescheduled.  If you need to cancel or reschedule your appointment, please call the office within 24 hours of your appointment. . Patient verbalized understanding. Sophia [name]

## 2022-05-01 ENCOUNTER — Encounter (HOSPITAL_COMMUNITY): Payer: Medicare HMO

## 2022-05-01 ENCOUNTER — Ambulatory Visit (HOSPITAL_COMMUNITY): Payer: Medicare HMO | Attending: Cardiovascular Disease

## 2022-05-01 DIAGNOSIS — E782 Mixed hyperlipidemia: Secondary | ICD-10-CM | POA: Diagnosis not present

## 2022-05-01 DIAGNOSIS — I480 Paroxysmal atrial fibrillation: Secondary | ICD-10-CM | POA: Insufficient documentation

## 2022-05-01 DIAGNOSIS — R931 Abnormal findings on diagnostic imaging of heart and coronary circulation: Secondary | ICD-10-CM | POA: Insufficient documentation

## 2022-05-01 DIAGNOSIS — I4819 Other persistent atrial fibrillation: Secondary | ICD-10-CM | POA: Insufficient documentation

## 2022-05-01 DIAGNOSIS — I10 Essential (primary) hypertension: Secondary | ICD-10-CM | POA: Diagnosis not present

## 2022-05-01 LAB — MYOCARDIAL PERFUSION IMAGING
LV dias vol: 49 mL (ref 46–106)
LV sys vol: 11 mL
Nuc Stress EF: 78 %
Peak HR: 88 {beats}/min
Rest HR: 56 {beats}/min
Rest Nuclear Isotope Dose: 10.1 mCi
SDS: 0
SRS: 0
SSS: 0
ST Depression (mm): 0 mm
Stress Nuclear Isotope Dose: 32.7 mCi
TID: 0.89

## 2022-05-01 MED ORDER — TECHNETIUM TC 99M TETROFOSMIN IV KIT: 10.1000 | PACK | Freq: Once | INTRAVENOUS | Status: AC | PRN

## 2022-05-01 MED ORDER — REGADENOSON 0.4 MG/5ML IV SOLN
0.4000 mg | Freq: Once | INTRAVENOUS | Status: AC
  Administered 2022-05-01: 0.4 mg via INTRAVENOUS

## 2022-05-01 MED ORDER — TECHNETIUM TC 99M TETROFOSMIN IV KIT
32.7000 | PACK | Freq: Once | INTRAVENOUS | Status: AC | PRN
  Administered 2022-05-01: 32.7 via INTRAVENOUS

## 2022-05-01 MED ORDER — TECHNETIUM TC 99M TETROFOSMIN IV KIT: 32.7000 | PACK | Freq: Once | INTRAVENOUS | Status: AC | PRN

## 2022-05-01 MED ORDER — TECHNETIUM TC 99M TETROFOSMIN IV KIT
10.1000 | PACK | Freq: Once | INTRAVENOUS | Status: AC | PRN
  Administered 2022-05-01: 10.1 via INTRAVENOUS

## 2022-05-01 MED ORDER — REGADENOSON 0.4 MG/5ML IV SOLN: 0.4000 mg | Freq: Once | INTRAVENOUS | Status: AC

## 2022-05-31 NOTE — Assessment & Plan Note (Signed)
Avoid offending foods, start probiotics. Do not eat large meals in late evening and consider raising head of bed.  

## 2022-05-31 NOTE — Assessment & Plan Note (Signed)
Rate controlled tolerating meds, following closely with cardiology

## 2022-05-31 NOTE — Assessment & Plan Note (Signed)
Doing well 

## 2022-05-31 NOTE — Assessment & Plan Note (Signed)
Encourage heart healthy diet such as MIND or DASH diet, increase exercise, avoid trans fats, simple carbohydrates and processed foods, consider a krill or fish or flaxseed oil cap daily.  Tolerating Repatha

## 2022-05-31 NOTE — Assessment & Plan Note (Signed)
hgba1c acceptable, minimize simple carbs. Increase exercise as tolerated.  

## 2022-06-01 ENCOUNTER — Ambulatory Visit (INDEPENDENT_AMBULATORY_CARE_PROVIDER_SITE_OTHER): Payer: Medicare HMO | Admitting: Family Medicine

## 2022-06-01 VITALS — BP 128/72 | HR 69 | Temp 97.6°F | Ht 61.0 in | Wt 139.4 lb

## 2022-06-01 DIAGNOSIS — I4819 Other persistent atrial fibrillation: Secondary | ICD-10-CM

## 2022-06-01 DIAGNOSIS — Z23 Encounter for immunization: Secondary | ICD-10-CM | POA: Diagnosis not present

## 2022-06-01 DIAGNOSIS — F419 Anxiety disorder, unspecified: Secondary | ICD-10-CM

## 2022-06-01 DIAGNOSIS — E782 Mixed hyperlipidemia: Secondary | ICD-10-CM | POA: Diagnosis not present

## 2022-06-01 DIAGNOSIS — K219 Gastro-esophageal reflux disease without esophagitis: Secondary | ICD-10-CM | POA: Diagnosis not present

## 2022-06-01 DIAGNOSIS — F32A Depression, unspecified: Secondary | ICD-10-CM | POA: Diagnosis not present

## 2022-06-01 DIAGNOSIS — R739 Hyperglycemia, unspecified: Secondary | ICD-10-CM | POA: Diagnosis not present

## 2022-06-01 LAB — COMPREHENSIVE METABOLIC PANEL
ALT: 11 U/L (ref 0–35)
AST: 17 U/L (ref 0–37)
Albumin: 4.4 g/dL (ref 3.5–5.2)
Alkaline Phosphatase: 93 U/L (ref 39–117)
BUN: 13 mg/dL (ref 6–23)
CO2: 28 mEq/L (ref 19–32)
Calcium: 9.3 mg/dL (ref 8.4–10.5)
Chloride: 102 mEq/L (ref 96–112)
Creatinine, Ser: 0.86 mg/dL (ref 0.40–1.20)
GFR: 63.98 mL/min (ref >60.00)
Glucose, Bld: 98 mg/dL (ref 70–99)
Potassium: 4.6 mEq/L (ref 3.5–5.1)
Sodium: 138 mEq/L (ref 135–145)
Total Bilirubin: 0.7 mg/dL (ref 0.2–1.2)
Total Protein: 6.8 g/dL (ref 6.0–8.3)

## 2022-06-01 LAB — CBC
HCT: 38.6 % (ref 36.0–46.0)
Hemoglobin: 12.9 g/dL (ref 12.0–15.0)
MCHC: 33.4 g/dL (ref 30.0–36.0)
MCV: 92.9 fl (ref 78.0–100.0)
Platelets: 174 10*3/uL (ref 150.0–400.0)
RBC: 4.16 Mil/uL (ref 3.87–5.11)
RDW: 14 % (ref 11.5–15.5)
WBC: 8.7 10*3/uL (ref 4.0–10.5)

## 2022-06-01 LAB — LIPID PANEL
Cholesterol: 170 mg/dL (ref 0–200)
HDL: 57.7 mg/dL (ref >39.00)
LDL Cholesterol: 77 mg/dL (ref 0–99)
NonHDL: 112.58
Total CHOL/HDL Ratio: 3
Triglycerides: 178 mg/dL — ABNORMAL HIGH (ref 0.0–149.0)
VLDL: 35.6 mg/dL (ref 0.0–40.0)

## 2022-06-01 LAB — HEMOGLOBIN A1C: Hgb A1c MFr Bld: 6 % (ref 4.6–6.5)

## 2022-06-01 LAB — TSH: TSH: 1.6 u[IU]/mL (ref 0.35–5.50)

## 2022-06-01 LAB — VITAMIN D 25 HYDROXY (VIT D DEFICIENCY, FRACTURES): VITD: 52.01 ng/mL (ref 30.00–100.00)

## 2022-06-01 NOTE — Progress Notes (Signed)
Subjective:    Patient ID: Sophia [name], female    DOB: Jul 05, 1940, 81 y.o.   MRN: [id number]  Chief Complaint  Patient presents with   Follow-up    Follow up    HPI Patient is in today for follow up and overall she is feeling well. No recent febrile illness but she has struggled with atrial fibrillation. She did respond to cardioversion and she feels better now. She is accompanied by her daughter who confirms. Denies CP/palp/SOB/HA/congestion/fevers/GI or GU c/o. Taking meds as prescribed   Past Medical History:  Diagnosis Date   Anxiety and depression    Arthritis    hands, knees, etc   Bladder prolapse, female, acquired 09/16/2014   Bowen's disease    Chronic atrial fibrillation (HCC)    Diverticulitis    Diverticulosis    GERD (gastroesophageal reflux disease)    H/O hematuria    for several years, work only revealed kidney stone on right side   Hepatic cyst    Hiatal hernia    History of chicken pox    History of kidney stones    Hyperlipidemia    Hypertension    IBS (irritable bowel syndrome)    Leg cramps, sleep related 04/02/2016   Myalgia 04/25/2017   Osteopenia 06/26/2014   Osteoporosis 06/26/2014   Plantar wart of [name] foot 10/15/2016   Renal cyst    Right shoulder pain 04/23/2017   SCC (squamous cell carcinoma)    arms   Statin intolerance 09/11/2019    Past Surgical History:  Procedure Laterality Date   CARDIOVERSION N/A 03/27/2022   Procedure: CARDIOVERSION;  Surgeon: Buford Dresser, MD;  Location: [facility];  Service: Cardiovascular;  Laterality: N/A;   CHOLECYSTECTOMY  06/30/2003   LOOP RECORDER INSERTION N/A 04/15/2018   Procedure: LOOP RECORDER INSERTION;  Surgeon: Sanda Klein, MD;  Location: [facility];  Service: Cardiovascular;  Laterality: N/A;   SKIN BIOPSY     multiple   SKIN CANCER EXCISION     forehead   TONSILLECTOMY     age 43    Family History  Problem Relation Age of Onset   Heart disease Mother         aortic stenosis   Hyperlipidemia Mother    Hypertension Mother    Heart attack Mother    Cancer Mother        BCC, SCC, skin   Hypertension Father    Cancer Father        SCC, BCC, skin   Stroke Brother    Heart disease Brother        arrythmia   Hypertension Brother    Hyperlipidemia Brother    Heart disease Maternal Grandfather        MI   Tuberculosis Paternal Grandmother    Cancer Paternal Grandfather        head and neck cancer, chewed tobacco   Hypertension Daughter    Colon polyps Daughter    Diverticulitis Daughter    Hyperlipidemia Daughter    Thyroid disease Daughter    Asthma Daughter    Kidney Stones Daughter    Heart disease Son    Heart attack Son    Birth defects Maternal Uncle    Colon cancer Maternal Uncle    Crohn's disease Niece     Social History   Socioeconomic History   Marital status: Single    Spouse name: Not on file   Number of children: 3   Years of education:  Not on file   Highest education level: Not on file  Occupational History   Occupation: retired  Tobacco Use   Smoking status: Never   Smokeless tobacco: Never   Tobacco comments:    Never smoke 03/18/22  Vaping Use   Vaping Use: Never used  Substance and Sexual Activity   Alcohol use: No    Alcohol/week: 0.0 standard drinks of alcohol   Drug use: No   Sexual activity: Never    Comment: divorced, widowed, lives with elderly parents, is the caregiver, no dietary restrictions.   Other Topics Concern   Not on file  Social History Narrative   Not on file   Social Determinants of Health   Financial Resource Strain: Low Risk  (09/01/2021)   Overall Financial Resource Strain (CARDIA)    Difficulty of Paying Living Expenses: Not hard at all  Food Insecurity: No Food Insecurity (09/01/2021)   Hunger Vital Sign    Worried About Running Out of Food in the Last Year: Never true    Ran Out of Food in the Last Year: Never true  Transportation Needs: No Transportation Needs  (09/01/2021)   PRAPARE - Hydrologist (Medical): No    Lack of Transportation (Non-Medical): No  Physical Activity: Sufficiently Active (09/01/2021)   Exercise Vital Sign    Days of Exercise per Week: 7 days    Minutes of Exercise per Session: 60 min  Stress: No Stress Concern Present (09/01/2021)   [place]    Feeling of Stress : Not at all  Social Connections: Moderately Integrated (09/01/2021)   Social Connection and Isolation Panel [NHANES]    Frequency of Communication with Friends and Family: More than three times a week    Frequency of Social Gatherings with Friends and Family: More than three times a week    Attends Religious Services: More than 4 times per year    Active Member of [employer] or Organizations: Yes    Attends Archivist Meetings: More than 4 times per year    Marital Status: Widowed  Intimate Partner Violence: Not At Risk (09/01/2021)   Humiliation, Afraid, Rape, and Kick questionnaire    Fear of Current or Ex-Partner: No    Emotionally Abused: No    Physically Abused: No    Sexually Abused: No    Outpatient Medications Prior to Visit  Medication Sig Dispense Refill   acetaminophen (TYLENOL) 500 MG tablet Take 1,000 mg by mouth 2 (two) times daily as needed for moderate pain or headache.     Calcium Carb-Cholecalciferol (CALCIUM 600 + D PO) Take 1 tablet by mouth daily.     carvedilol (COREG) 25 MG tablet TAKE 1 TABLET(25 MG) BY MOUTH TWICE DAILY WITH A MEAL 180 tablet 3   Cholecalciferol (VITAMIN D) 50 MCG (2000 UT) tablet Take 1 tablet (2,000 Units total) by mouth daily.     Co-Enzyme Q10 200 MG CAPS Take 200 mg by mouth daily.     diltiazem (CARDIZEM CD) 240 MG 24 hr capsule TAKE 1 CAPSULE(240 MG) BY MOUTH DAILY 90 capsule 3   ELIQUIS 5 MG TABS tablet TAKE 1 TABLET(5 MG) BY MOUTH TWICE DAILY 180 tablet 1   estradiol (ESTRACE) 0.1 MG/GM vaginal cream Place 1  Applicatorful vaginally 2 (two) times a week.     Hypromellose (ARTIFICIAL TEARS OP) Place 1 drop into both eyes as needed (dry eyes).     pantoprazole (PROTONIX)  40 MG tablet Take 1 tablet (40 mg total) by mouth daily. 30 tablet 5   Probiotic Product (PROBIOTIC DAILY PO) Take 1 capsule by mouth daily.      REPATHA SURECLICK 109 MG/ML SOAJ INJECT 1 DOSE INTO THE SKIN EVERY 14 DAYS 2 mL 11   Facility-Administered Medications Prior to Visit  Medication Dose Route Frequency Provider Last Rate Last Admin   regadenoson (LEXISCAN) injection SOLN 0.4 mg  0.4 mg Intravenous Once Chandrasekhar, Mahesh A, MD       technetium tetrofosmin (TC-MYOVIEW) injection 32.3 millicurie  55.7 millicurie Intravenous Once PRN Chandrasekhar, Mahesh A, MD       technetium tetrofosmin (TC-MYOVIEW) injection 32.2 millicurie  02.5 millicurie Intravenous Once PRN Chandrasekhar, Mahesh A, MD        Allergies  Allergen Reactions   Morphine And Related Swelling   Statins Other (See Comments)    Myalgias, weakness (atorvastatin, RYR, rosuvastatin, pravastatin)   Sulfa Antibiotics Swelling and Rash    Review of Systems  Constitutional:  Positive for malaise/fatigue. Negative for fever.  HENT:  Negative for congestion.   Eyes:  Negative for blurred vision.  Respiratory:  Negative for shortness of breath.   Cardiovascular:  Negative for chest pain, palpitations and leg swelling.  Gastrointestinal:  Negative for abdominal pain, blood in stool and nausea.  Genitourinary:  Negative for dysuria and frequency.  Musculoskeletal:  Negative for falls.  Skin:  Negative for rash.  Neurological:  Positive for weakness. Negative for dizziness, loss of consciousness and headaches.  Endo/Heme/Allergies:  Negative for environmental allergies.  Psychiatric/Behavioral:  Negative for depression. The patient is not nervous/anxious.        Objective:    Physical Exam Constitutional:      General: She is not in acute distress.     Appearance: She is well-developed.  HENT:     Head: Normocephalic and atraumatic.  Eyes:     Conjunctiva/sclera: Conjunctivae normal.  Neck:     Thyroid: No thyromegaly.  Cardiovascular:     Rate and Rhythm: Normal rate and regular rhythm.     Heart sounds: Normal heart sounds. No murmur heard. Pulmonary:     Effort: Pulmonary effort is normal. No respiratory distress.     Breath sounds: Normal breath sounds.  Abdominal:     General: Bowel sounds are normal. There is no distension.     Palpations: Abdomen is soft. There is no mass.     Tenderness: There is no abdominal tenderness.  Musculoskeletal:     Cervical back: Neck supple.  Lymphadenopathy:     Cervical: No cervical adenopathy.  Skin:    General: Skin is warm and dry.  Neurological:     Mental Status: She is alert and oriented to person, place, and time.  Psychiatric:        Behavior: Behavior normal.     BP 128/72 (BP Location: Right Arm, Patient Position: Sitting, Cuff Size: Normal)   Pulse 69   Temp 97.6 F (36.4 C) (Oral)   Ht '5\' 1"'$  (1.549 m)   Wt 139 lb 6.4 oz (63.2 kg)   SpO2 96%   BMI 26.34 kg/m  Wt Readings from Last 3 Encounters:  06/01/22 139 lb 6.4 oz (63.2 kg)  05/01/22 137 lb (62.1 kg)  04/06/22 137 lb (62.1 kg)    Diabetic Foot Exam - Simple   No data filed    Lab Results  Component Value Date   WBC 8.7 06/01/2022   HGB 12.9 06/01/2022  HCT 38.6 06/01/2022   PLT 174.0 06/01/2022   GLUCOSE 98 06/01/2022   CHOL 170 06/01/2022   TRIG 178.0 (H) 06/01/2022   HDL 57.70 06/01/2022   LDLDIRECT 137.0 12/25/2019   LDLCALC 77 06/01/2022   ALT 11 06/01/2022   AST 17 06/01/2022   NA 138 06/01/2022   K 4.6 06/01/2022   CL 102 06/01/2022   CREATININE 0.86 06/01/2022   BUN 13 06/01/2022   CO2 28 06/01/2022   TSH 1.60 06/01/2022   HGBA1C 6.0 06/01/2022    Lab Results  Component Value Date   TSH 1.60 06/01/2022   Lab Results  Component Value Date   WBC 8.7 06/01/2022   HGB 12.9  06/01/2022   HCT 38.6 06/01/2022   MCV 92.9 06/01/2022   PLT 174.0 06/01/2022   Lab Results  Component Value Date   NA 138 06/01/2022   K 4.6 06/01/2022   CO2 28 06/01/2022   GLUCOSE 98 06/01/2022   BUN 13 06/01/2022   CREATININE 0.86 06/01/2022   BILITOT 0.7 06/01/2022   ALKPHOS 93 06/01/2022   AST 17 06/01/2022   ALT 11 06/01/2022   PROT 6.8 06/01/2022   ALBUMIN 4.4 06/01/2022   CALCIUM 9.3 06/01/2022   ANIONGAP 7 03/18/2022   GFR 63.98 06/01/2022   Lab Results  Component Value Date   CHOL 170 06/01/2022   Lab Results  Component Value Date   HDL 57.70 06/01/2022   Lab Results  Component Value Date   LDLCALC 77 06/01/2022   Lab Results  Component Value Date   TRIG 178.0 (H) 06/01/2022   Lab Results  Component Value Date   CHOLHDL 3 06/01/2022   Lab Results  Component Value Date   HGBA1C 6.0 06/01/2022       Assessment & Plan:   Problem List Items Addressed This Visit     GERD (gastroesophageal reflux disease)    Avoid offending foods, start probiotics. Do not eat large meals in late evening and consider raising head of bed.        Hyperlipidemia    Encourage heart healthy diet such as MIND or DASH diet, increase exercise, avoid trans fats, simple carbohydrates and processed foods, consider a krill or fish or flaxseed oil cap daily.  Tolerating Repatha      Relevant Orders   Lipid panel (Completed)   Anxiety and depression - Primary    Doing well      Hyperglycemia    hgba1c acceptable, minimize simple carbs. Increase exercise as tolerated.       Relevant Orders   Hemoglobin A1c (Completed)   Persistent atrial fibrillation (HCC)    Rate controlled tolerating meds, following closely with cardiology and she as responded well to her cardioversion and in RRR today      Relevant Orders   CBC (Completed)   Comprehensive metabolic panel (Completed)   TSH (Completed)   Other Visit Diagnoses     Hypocalcemia       Relevant Orders   VITAMIN  D 25 Hydroxy (Vit-D Deficiency, Fractures) (Completed)   Need for influenza vaccination       Relevant Orders   Flu Vaccine QUAD High Dose(Fluad) (Completed)       I am having Ashlon L. Copelan "Sandy" maintain her Probiotic Product (PROBIOTIC DAILY PO), estradiol, Calcium Carb-Cholecalciferol (CALCIUM 600 + D PO), acetaminophen, Hypromellose (ARTIFICIAL TEARS OP), Vitamin D, Co-Enzyme Q10, diltiazem, Eliquis, Repatha SureClick, pantoprazole, and carvedilol.  No orders of the defined types were placed in  this encounter.    Penni Homans, MD

## 2022-06-01 NOTE — Patient Instructions (Addendum)
MIND ([place]iter[place] and DASH) DASH (dietary adjustment of sodium for HTN control) Mediterranean  RSV (respiratory syncitial virus) vaccine at pharmacy, Arexvy Covid booster at pharmacy High dose flu shot today Prevnar 20 Shingrix is the new shingles shot, 2 shots over 2-6 months, confirm coverage with insurance and document, then can return here for shots with nurse appt or at pharmacy

## 2022-06-15 DIAGNOSIS — N8111 Cystocele, midline: Secondary | ICD-10-CM | POA: Diagnosis not present

## 2022-06-15 DIAGNOSIS — N814 Uterovaginal prolapse, unspecified: Secondary | ICD-10-CM | POA: Diagnosis not present

## 2022-06-15 DIAGNOSIS — Z4689 Encounter for fitting and adjustment of other specified devices: Secondary | ICD-10-CM | POA: Diagnosis not present

## 2022-06-16 ENCOUNTER — Encounter: Payer: Self-pay | Admitting: Cardiovascular Disease

## 2022-06-16 ENCOUNTER — Ambulatory Visit: Payer: Medicare HMO | Attending: Cardiovascular Disease | Admitting: Cardiovascular Disease

## 2022-06-16 VITALS — BP 124/62 | HR 59 | Ht 60.0 in | Wt 139.6 lb

## 2022-06-16 DIAGNOSIS — R931 Abnormal findings on diagnostic imaging of heart and coronary circulation: Secondary | ICD-10-CM

## 2022-06-16 DIAGNOSIS — E782 Mixed hyperlipidemia: Secondary | ICD-10-CM | POA: Diagnosis not present

## 2022-06-16 DIAGNOSIS — I10 Essential (primary) hypertension: Secondary | ICD-10-CM | POA: Diagnosis not present

## 2022-06-16 DIAGNOSIS — I480 Paroxysmal atrial fibrillation: Secondary | ICD-10-CM | POA: Diagnosis not present

## 2022-06-16 NOTE — Progress Notes (Signed)
06/16/2022 DRAVEN LAINE   [birth year]  [id number]  Primary Physician Mosie Lukes, MD Primary Cardiologist: Lorretta Harp MD FACP, [place], [place], [place]  HPI:  Sophia [name] is a 81 y.o.   mild to moderately overweight divorced Caucasian female mother of 3 children, grandmother of 5 grandchildren who I last saw in the office 03/17/2022.  She is accompanied by her daughter Angus Palms today.  She was referred by the ER for evaluation of chest pain.  Her primary care provider is Dr. [name] .   Risk factors include hyperlipidemia intolerant to statin therapy and family history with mother who had a myocardial infarction at age 18 as well as extensive vascular disease.  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  She is never had a heart attack or stroke.  She has had palpitations evaluated by cardiologist in [place] 7 years ago which time a work-up included stress testing and event monitoring.  She was placed on carvedilol at that time.  She was seen in the emergency room at [facility] on 02/22/2018 with chest pain and palpitations.  Pain began in the early morning hours and got worse.  She thought it was indigestion.  She did feel increasing palpitations as well as shortness of breath.  She says she can also hear her "heartbeat" in her left ear.  Her troponins were negative and her EKG showed no acute changes at that time.   When I saw her in the office on 11/08/2019 she was in A. fib with a ventricular response of 106.  I did add Cardizem CD 120 mg a day.  She was already on Eliquis.  I referred her to the A. fib clinic who saw her on 11/13/2019.  She had converted spontaneously.     She  did well until a Saturday morning back in September when she was awakened with chest pressure and palpitations similar to her prior episodes of PAF.  She is in atrial fibrillation with a controlled ventricular response today in the office.  She says she  still has some vague chest pressure.  She has been on Eliquis uninterrupted.  I am referring her to the A-fib clinic to arrange outpatient DC cardioversion in the very near future.  I did get a coronary calcium score on her 01/24/2020 which was 304.  Her last functional study 03/09/2018 was normal.  I referred her to the A-fib clinic who arranged for her to undergo outpatient DC cardioversion by Dr. Buford Dresser 03/27/2022 successfully back to sinus rhythm.  She does feel clinically improved.  Because of occasional chest pain she had a Myoview stress test performed 05/01/2022 which was entirely normal.   Current Meds  Medication Sig   acetaminophen (TYLENOL) 500 MG tablet Take 1,000 mg by mouth 2 (two) times daily as needed for moderate pain or headache.   Calcium Carb-Cholecalciferol (CALCIUM 600 + D PO) Take 1 tablet by mouth daily.   carvedilol (COREG) 25 MG tablet TAKE 1 TABLET(25 MG) BY MOUTH TWICE DAILY WITH A MEAL   Cholecalciferol (VITAMIN D) 50 MCG (2000 UT) tablet Take 1 tablet (2,000 Units total) by mouth daily.   Co-Enzyme Q10 200 MG CAPS Take 200 mg by mouth daily.   diltiazem (CARDIZEM CD) 240 MG 24 hr capsule TAKE 1 CAPSULE(240 MG) BY MOUTH DAILY   ELIQUIS 5 MG TABS tablet TAKE 1 TABLET(5 MG) BY MOUTH TWICE DAILY  estradiol (ESTRACE) 0.1 MG/GM vaginal cream Place 1 Applicatorful vaginally 2 (two) times a week.   Hypromellose (ARTIFICIAL TEARS OP) Place 1 drop into both eyes as needed (dry eyes).   pantoprazole (PROTONIX) 40 MG tablet Take 1 tablet (40 mg total) by mouth daily.   Probiotic Product (PROBIOTIC DAILY PO) Take 1 capsule by mouth daily.    REPATHA SURECLICK 546 MG/ML SOAJ INJECT 1 DOSE INTO THE SKIN EVERY 14 DAYS     Allergies  Allergen Reactions   Morphine And Related Swelling   Statins Other (See Comments)    Myalgias, weakness (atorvastatin, RYR, rosuvastatin, pravastatin)   Sulfa Antibiotics Swelling and Rash    Social History   Socioeconomic History    Marital status: Single    Spouse name: Not on file   Number of children: 3   Years of education: Not on file   Highest education level: Not on file  Occupational History   Occupation: retired  Tobacco Use   Smoking status: Never   Smokeless tobacco: Never   Tobacco comments:    Never smoke 03/18/22  Vaping Use   Vaping Use: Never used  Substance and Sexual Activity   Alcohol use: No    Alcohol/week: 0.0 standard drinks of alcohol   Drug use: No   Sexual activity: Never    Comment: divorced, widowed, lives with elderly parents, is the caregiver, no dietary restrictions.   Other Topics Concern   Not on file  Social History Narrative   Not on file   Social Determinants of Health   Financial Resource Strain: Low Risk  (09/01/2021)   Overall Financial Resource Strain (CARDIA)    Difficulty of Paying Living Expenses: Not hard at all  Food Insecurity: No Food Insecurity (09/01/2021)   Hunger Vital Sign    Worried About Running Out of Food in the Last Year: Never true    Ran Out of Food in the Last Year: Never true  Transportation Needs: No Transportation Needs (09/01/2021)   PRAPARE - Hydrologist (Medical): No    Lack of Transportation (Non-Medical): No  Physical Activity: Sufficiently Active (09/01/2021)   Exercise Vital Sign    Days of Exercise per Week: 7 days    Minutes of Exercise per Session: 60 min  Stress: No Stress Concern Present (09/01/2021)   [place]    Feeling of Stress : Not at all  Social Connections: Moderately Integrated (09/01/2021)   Social Connection and Isolation Panel [NHANES]    Frequency of Communication with Friends and Family: More than three times a week    Frequency of Social Gatherings with Friends and Family: More than three times a week    Attends Religious Services: More than 4 times per year    Active Member of [employer] or Organizations: Yes    Attends  Archivist Meetings: More than 4 times per year    Marital Status: Widowed  Intimate Partner Violence: Not At Risk (09/01/2021)   Humiliation, Afraid, Rape, and Kick questionnaire    Fear of Current or Ex-Partner: No    Emotionally Abused: No    Physically Abused: No    Sexually Abused: No     Review of Systems: General: negative for chills, fever, night sweats or weight changes.  Cardiovascular: negative for chest pain, dyspnea on exertion, edema, orthopnea, palpitations, paroxysmal nocturnal dyspnea or shortness of breath Dermatological: negative for rash Respiratory: negative for cough or  wheezing Urologic: negative for hematuria Abdominal: negative for nausea, vomiting, diarrhea, bright red blood per rectum, melena, or hematemesis Neurologic: negative for visual changes, syncope, or dizziness All other systems reviewed and are otherwise negative except as noted above.    Blood pressure 124/62, pulse (!) 59, height 5' (1.524 m), weight 139 lb 9.6 oz (63.3 kg).  General appearance: alert and no distress Neck: no adenopathy, no carotid bruit, no JVD, supple, symmetrical, trachea midline, and thyroid not enlarged, symmetric, no tenderness/mass/nodules Lungs: clear to auscultation bilaterally Heart: regular rate and rhythm, S1, S2 normal, no murmur, click, rub or gallop Extremities: extremities normal, atraumatic, no cyanosis or edema Pulses: 2+ and symmetric Skin: Skin color, texture, turgor normal. No rashes or lesions Neurologic: Grossly normal  EKG normal sinus rhythm at 63 without ST or T wave changes.  Personally reviewed this EKG.  ASSESSMENT AND PLAN:   Hypertension History of essential hypertension a blood pressure measured today at 124/62.  She is on carvedilol, and diltiazem.  Hyperlipidemia History of hyperlipidemia on Repatha with lipid profile performed 06/01/2022 revealing total cholesterol 170, LDL 77 and HDL of 57.  Paroxysmal atrial fibrillation  (HCC) History of PAF status post successful DC cardioversion by Dr. Harrell Gave 03/27/2022 to sinus rhythm.  She is in sinus rhythm today on Eliquis.  She feels clinically improved and denies chest pain or shortness of breath.  Elevated coronary artery calcium score Increase coronary calcium score of 304 measured 01/24/2020.  She recently had a negative Myoview stress test 05/01/2022.  Her LDL is at goal for secondary prevention.     Lorretta Harp MD FACP,FACC,FAHA, [facility] 06/16/2022 8:51 AM

## 2022-06-16 NOTE — Assessment & Plan Note (Signed)
Increase coronary calcium score of 304 measured 01/24/2020.  She recently had a negative Myoview stress test 05/01/2022.  Her LDL is at goal for secondary prevention.

## 2022-06-16 NOTE — Assessment & Plan Note (Signed)
History of PAF status post successful DC cardioversion by Dr. Harrell Gave 03/27/2022 to sinus rhythm.  She is in sinus rhythm today on Eliquis.  She feels clinically improved and denies chest pain or shortness of breath.

## 2022-06-16 NOTE — Patient Instructions (Signed)
Medication Instructions:  Your physician recommends that you continue on your current medications as directed. Please refer to the Current Medication list given to you today.  *If you need a refill on your cardiac medications before your next appointment, please call your pharmacy*   Follow-Up: At [facility], you and your health needs are our priority.  As part of our continuing mission to provide you with exceptional heart care, we have created designated Provider Care Teams.  These Care Teams include your primary Cardiologist (physician) and Advanced Practice Providers (APPs -  Physician Assistants and Nurse Practitioners) who all work together to provide you with the care you need, when you need it.  We recommend signing up for the patient portal called "MyChart".  Sign up information is provided on this After Visit Summary.  MyChart is used to connect with patients for Virtual Visits (Telemedicine).  Patients are able to view lab/test results, encounter notes, upcoming appointments, etc.  Non-urgent messages can be sent to your provider as well.   To learn more about what you can do with MyChart, go to [email or web address].    Your next appointment:   6 month(s)  The format for your next appointment:   In Person  Provider:   Fabian Sharp, PA-C, Sande Rives, PA-C, Caron Presume, PA-C, Jory Sims, DNP, ANP, Almyra Deforest, PA-C, or Diona Browner, NP       Then, Quay Burow, MD will plan to see you again in 12 month(s).

## 2022-06-16 NOTE — Assessment & Plan Note (Signed)
History of hyperlipidemia on Repatha with lipid profile performed 06/01/2022 revealing total cholesterol 170, LDL 77 and HDL of 57.

## 2022-06-16 NOTE — Assessment & Plan Note (Signed)
History of essential hypertension a blood pressure measured today at 124/62.  She is on carvedilol, and diltiazem.

## 2022-07-29 ENCOUNTER — Other Ambulatory Visit: Payer: Self-pay | Admitting: Internal Medicine

## 2022-07-29 ENCOUNTER — Other Ambulatory Visit: Payer: Self-pay | Admitting: Cardiovascular Disease

## 2022-07-29 DIAGNOSIS — I480 Paroxysmal atrial fibrillation: Secondary | ICD-10-CM

## 2022-07-29 NOTE — Telephone Encounter (Signed)
Eliquis '5mg'$  refill request received. Patient is 82 years old, weight-63.3kg, Crea-0.86 on 06/01/22, Diagnosis-Afib, and last seen by Dr. Gwenlyn Found on 06/16/22. Dose is appropriate based on dosing criteria. Will send in refill to requested pharmacy.

## 2022-08-06 ENCOUNTER — Encounter (HOSPITAL_COMMUNITY): Payer: Self-pay | Admitting: *Deleted

## 2022-08-07 ENCOUNTER — Other Ambulatory Visit: Payer: Self-pay | Admitting: Gastroenterology

## 2022-08-20 IMAGING — DX DG SHOULDER 2+V*L*
4 series · 4 of 4 positions shown · non-contrast
Comparison: None.

CLINICAL DATA: Pain.

EXAM:
LEFT SHOULDER - 2+ VIEW

[shoulder grashey]
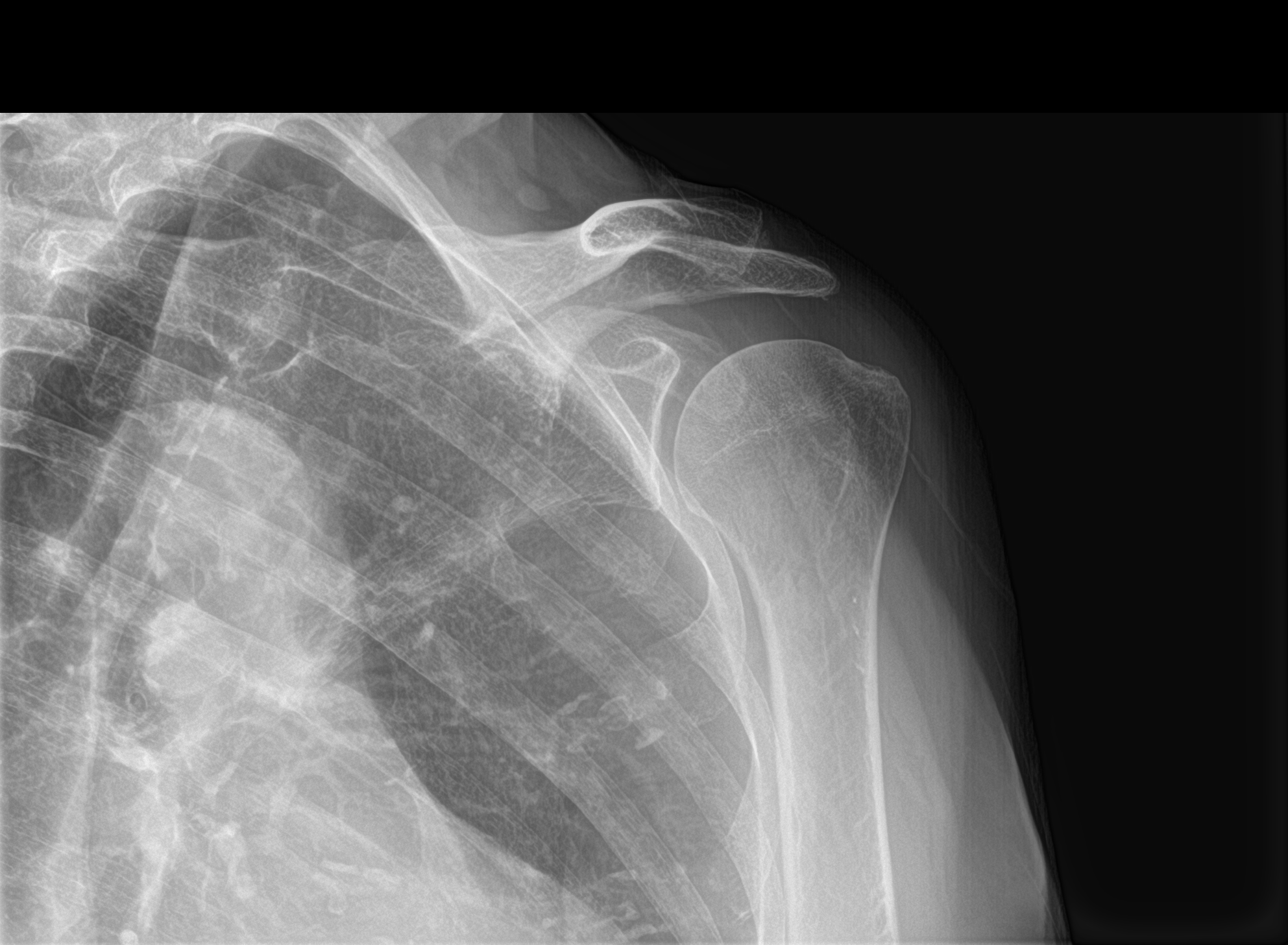

[shoulder y view]
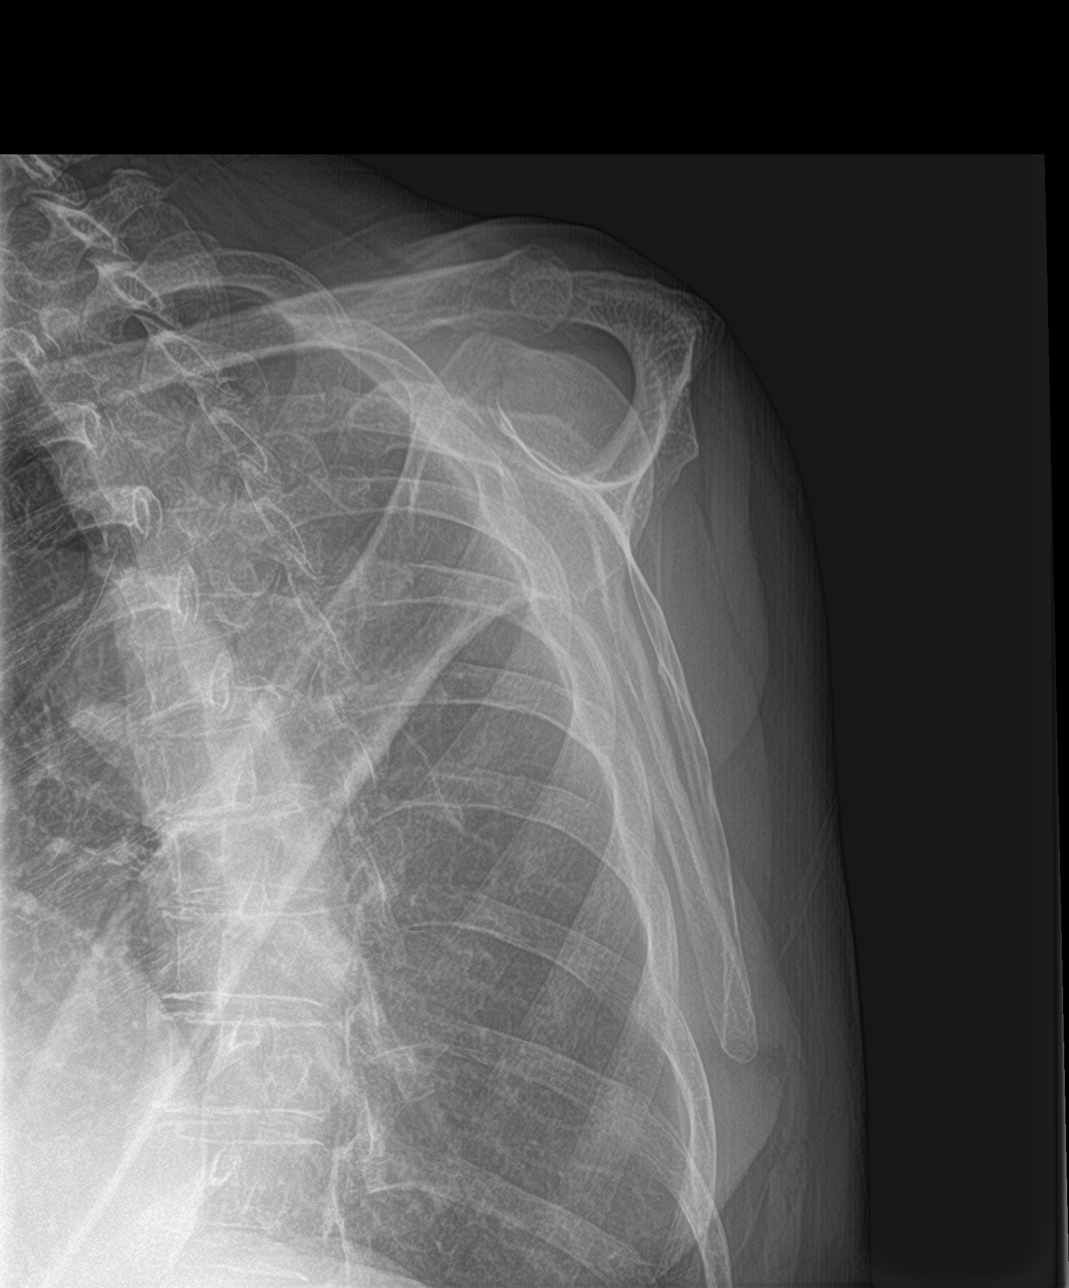

[shoulder axillary]
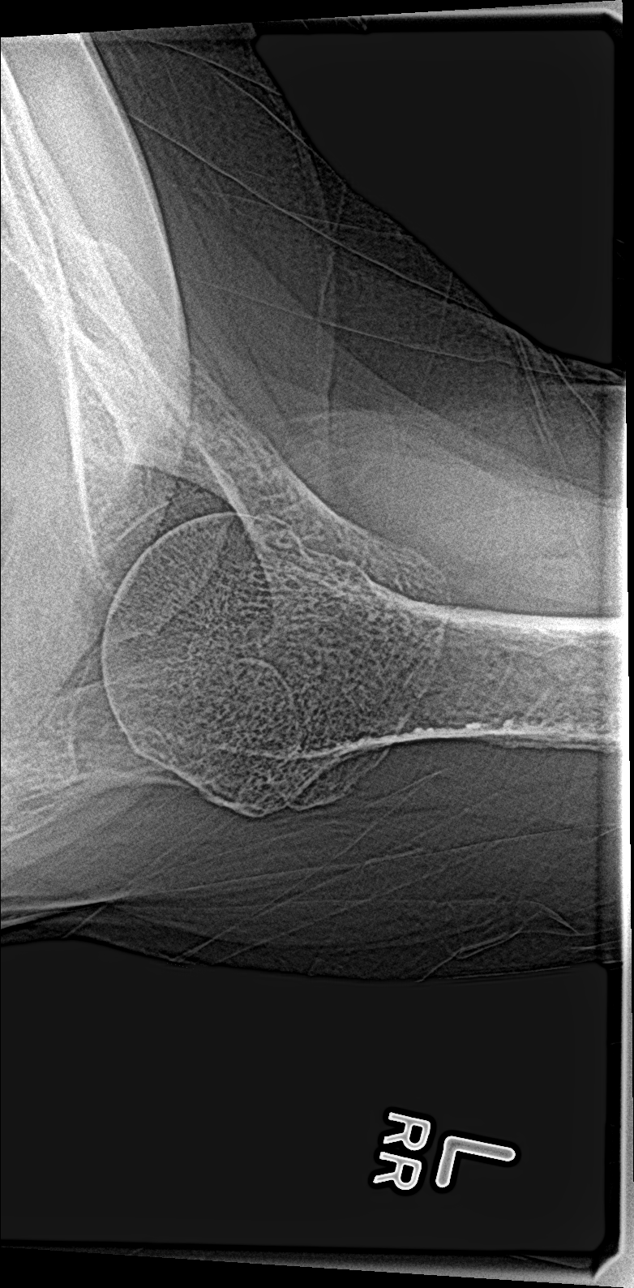

[shoulder ap neutral]
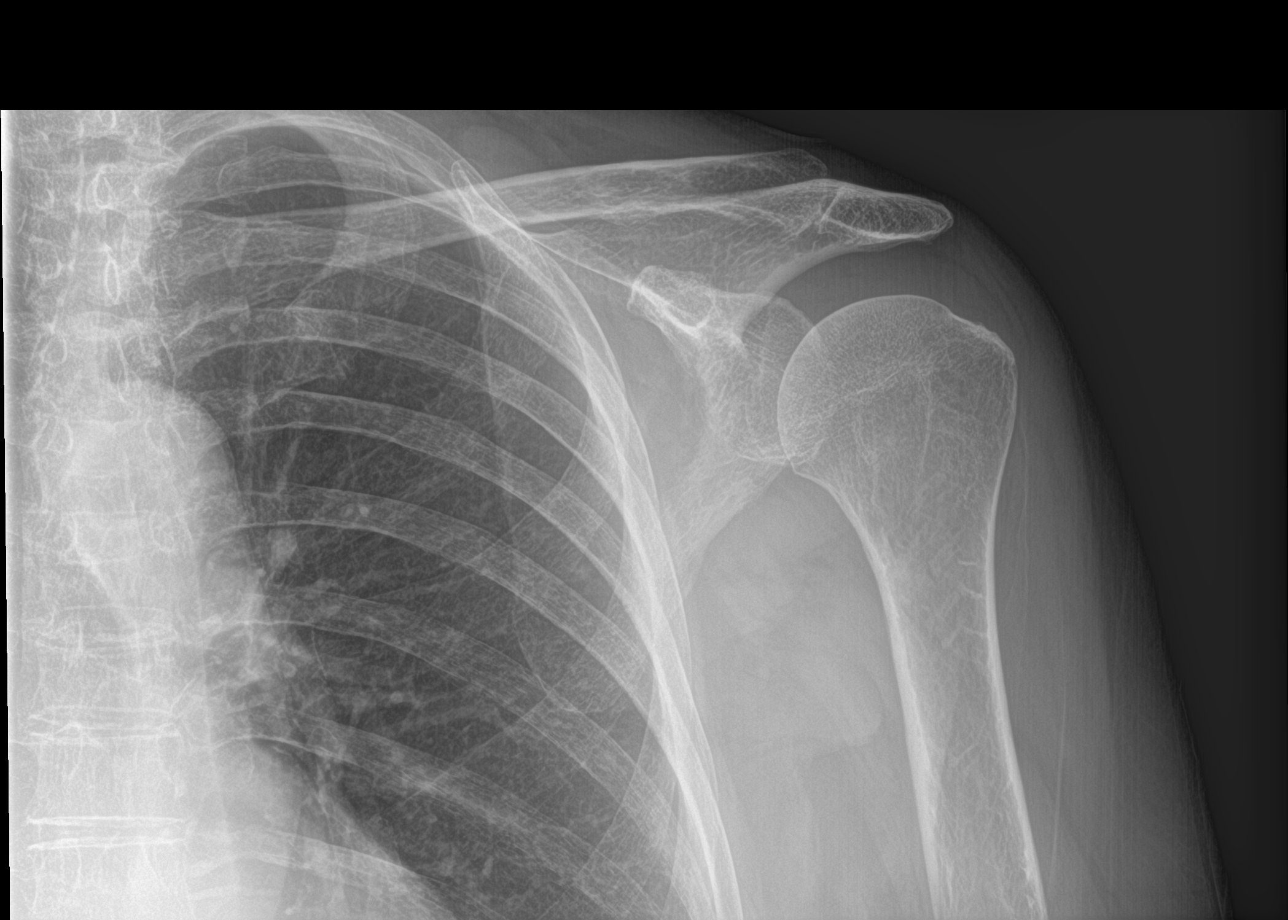

[4 of 4 positions shown; findings below may reference images not displayed]

FINDINGS: There is no evidence of fracture or dislocation. There is no
evidence of arthropathy or other focal bone abnormality. Soft
tissues are unremarkable.
IMPRESSION: Negative.

## 2022-08-21 ENCOUNTER — Telehealth: Payer: Self-pay | Admitting: Family Medicine

## 2022-08-21 NOTE — Telephone Encounter (Signed)
Contacted [name] to schedule their annual wellness visit. Appointment made for 09/03/2022.  Sherol Dade; Care Guide [facility] Direct Dial: [phone]

## 2022-09-03 ENCOUNTER — Ambulatory Visit (INDEPENDENT_AMBULATORY_CARE_PROVIDER_SITE_OTHER): Payer: Medicare HMO | Admitting: *Deleted

## 2022-09-03 DIAGNOSIS — Z0000 Encounter for general adult medical examination without abnormal findings: Secondary | ICD-10-CM

## 2022-09-03 NOTE — Patient Instructions (Signed)
[name] , Thank you for taking time to come for your Medicare Wellness Visit. I appreciate your ongoing commitment to your health goals. Please review the following plan we discussed and let me know if I can assist you in the future.     This is a list of the screening recommended for you and due dates:  Health Maintenance  Topic Date Due   Zoster (Shingles) Vaccine (1 of 2) Never done   Pneumonia Vaccine (1 of 1 - PCV) Never done   COVID-19 Vaccine (3 - Pfizer risk series) 04/16/2020   Medicare Annual Wellness Visit  09/03/2023   DTaP/Tdap/Td vaccine (2 - Td or Tdap) 12/11/2026   Flu Shot  Completed   DEXA scan (bone density measurement)  Completed   HPV Vaccine  Aged Out     Next appointment: Follow up in one year for your annual wellness visit.   Preventive Care 20 Years and Older, Female Preventive care refers to lifestyle choices and visits with your health care provider that can promote health and wellness. What does preventive care include? A yearly physical exam. This is also called an annual well check. Dental exams once or twice a year. Routine eye exams. Ask your health care provider how often you should have your eyes checked. Personal lifestyle choices, including: Daily care of your teeth and gums. Regular physical activity. Eating a healthy diet. Avoiding tobacco and drug use. Limiting alcohol use. Practicing safe sex. Taking low-dose aspirin every day. Taking vitamin and mineral supplements as recommended by your health care provider. What happens during an annual well check? The services and screenings done by your health care provider during your annual well check will depend on your age, overall health, lifestyle risk factors, and family history of disease. Counseling  Your health care provider may ask you questions about your: Alcohol use. Tobacco use. Drug use. Emotional well-being. Home and relationship well-being. Sexual activity. Eating  habits. History of falls. Memory and ability to understand (cognition). Work and work Statistician. Reproductive health. Screening  You may have the following tests or measurements: Height, weight, and BMI. Blood pressure. Lipid and cholesterol levels. These may be checked every 5 years, or more frequently if you are over 64 years old. Skin check. Lung cancer screening. You may have this screening every year starting at age 81 if you have a 30-pack-year history of smoking and currently smoke or have quit within the past 15 years. Fecal occult blood test (FOBT) of the stool. You may have this test every year starting at age 81. Flexible sigmoidoscopy or colonoscopy. You may have a sigmoidoscopy every 5 years or a colonoscopy every 10 years starting at age 40. Hepatitis C blood test. Hepatitis B blood test. Sexually transmitted disease (STD) testing. Diabetes screening. This is done by checking your blood sugar (glucose) after you have not eaten for a while (fasting). You may have this done every 1-3 years. Bone density scan. This is done to screen for osteoporosis. You may have this done starting at age 81. Mammogram. This may be done every 1-2 years. Talk to your health care provider about how often you should have regular mammograms. Talk with your health care provider about your test results, treatment options, and if necessary, the need for more tests. Vaccines  Your health care provider may recommend certain vaccines, such as: Influenza vaccine. This is recommended every year. Tetanus, diphtheria, and acellular pertussis (Tdap, Td) vaccine. You may need a Td booster every 10 years.  Zoster vaccine. You may need this after age 1. Pneumococcal 13-valent conjugate (PCV13) vaccine. One dose is recommended after age 81. Pneumococcal polysaccharide (PPSV23) vaccine. One dose is recommended after age 81. Talk to your health care provider about which screenings and vaccines you need and how  often you need them. This information is not intended to replace advice given to you by your health care provider. Make sure you discuss any questions you have with your health care provider. Document Released: 07/12/2015 Document Revised: 03/04/2016 Document Reviewed: 04/16/2015 Elsevier Interactive Patient Education  2017 [employer] Prevention in the Home Falls can cause injuries. They can happen to people of all ages. There are many things you can do to make your home safe and to help prevent falls. What can I do on the outside of my home? Regularly fix the edges of walkways and driveways and fix any cracks. Remove anything that might make you trip as you walk through a door, such as a raised step or threshold. Trim any bushes or trees on the path to your home. Use bright outdoor lighting. Clear any walking paths of anything that might make someone trip, such as rocks or tools. Regularly check to see if handrails are loose or broken. Make sure that both sides of any steps have handrails. Any raised decks and porches should have guardrails on the edges. Have any leaves, snow, or ice cleared regularly. Use sand or salt on walking paths during winter. Clean up any spills in your garage right away. This includes oil or grease spills. What can I do in the bathroom? Use night lights. Install grab bars by the toilet and in the tub and shower. Do not use towel bars as grab bars. Use non-skid mats or decals in the tub or shower. If you need to sit down in the shower, use a plastic, non-slip stool. Keep the floor dry. Clean up any water that spills on the floor as soon as it happens. Remove soap buildup in the tub or shower regularly. Attach bath mats securely with double-sided non-slip rug tape. Do not have throw rugs and other things on the floor that can make you trip. What can I do in the bedroom? Use night lights. Make sure that you have a light by your bed that is easy to  reach. Do not use any sheets or blankets that are too big for your bed. They should not hang down onto the floor. Have a firm chair that has side arms. You can use this for support while you get dressed. Do not have throw rugs and other things on the floor that can make you trip. What can I do in the kitchen? Clean up any spills right away. Avoid walking on wet floors. Keep items that you use a lot in easy-to-reach places. If you need to reach something above you, use a strong step stool that has a grab bar. Keep electrical cords out of the way. Do not use floor polish or wax that makes floors slippery. If you must use wax, use non-skid floor wax. Do not have throw rugs and other things on the floor that can make you trip. What can I do with my stairs? Do not leave any items on the stairs. Make sure that there are handrails on both sides of the stairs and use them. Fix handrails that are broken or loose. Make sure that handrails are as long as the stairways. Check any carpeting to make sure that  it is firmly attached to the stairs. Fix any carpet that is loose or worn. Avoid having throw rugs at the top or bottom of the stairs. If you do have throw rugs, attach them to the floor with carpet tape. Make sure that you have a light switch at the top of the stairs and the bottom of the stairs. If you do not have them, ask someone to add them for you. What else can I do to help prevent falls? Wear shoes that: Do not have high heels. Have rubber bottoms. Are comfortable and fit you well. Are closed at the toe. Do not wear sandals. If you use a stepladder: Make sure that it is fully opened. Do not climb a closed stepladder. Make sure that both sides of the stepladder are locked into place. Ask someone to hold it for you, if possible. Clearly mark and make sure that you can see: Any grab bars or handrails. First and last steps. Where the edge of each step is. Use tools that help you move  around (mobility aids) if they are needed. These include: Canes. Walkers. Scooters. Crutches. Turn on the lights when you go into a dark area. Replace any light bulbs as soon as they burn out. Set up your furniture so you have a clear path. Avoid moving your furniture around. If any of your floors are uneven, fix them. If there are any pets around you, be aware of where they are. Review your medicines with your doctor. Some medicines can make you feel dizzy. This can increase your chance of falling. Ask your doctor what other things that you can do to help prevent falls. This information is not intended to replace advice given to you by your health care provider. Make sure you discuss any questions you have with your health care provider. Document Released: 04/11/2009 Document Revised: 11/21/2015 Document Reviewed: 07/20/2014 Elsevier Interactive Patient Education  2017 [employer].

## 2022-09-03 NOTE — Progress Notes (Signed)
Subjective:   Sophia [name] is a 82 y.o. female who presents for Medicare Annual (Subsequent) preventive examination.  I connected with  [name] on 09/03/22 by a audio enabled telemedicine application and verified that I am speaking with the correct person using two identifiers.  Patient Location: Home  Provider Location: Office/Clinic  I discussed the limitations of evaluation and management by telemedicine. The patient expressed understanding and agreed to proceed.   Review of Systems     Cardiac Risk Factors include: advanced age (>49mn, >>74women);dyslipidemia;hypertension     Objective:    There were no vitals filed for this visit. There is no height or weight on file to calculate BMI.     09/03/2022    1:40 PM 03/27/2022    9:45 AM 09/01/2021    2:25 PM 09/01/2021    1:47 PM 04/25/2021    9:33 AM 01/29/2021   11:08 AM 05/04/2019    2:39 PM  Advanced Directives  Does Patient Have a Medical Advance Directive? Yes Yes Yes Yes Yes Yes Yes  Type of AParamedicof AStephens CityLiving will HEdgertonLiving will [place]ol[place]Out of facility DNR (pink MOST or yellow form) HLake StationLiving will HAntigoLiving will HGreenvilleLiving will HBowmanLiving will  Does patient want to make changes to medical advance directive? No - Patient declined      No - Patient declined  Copy of [place]noqualmie Passin Chart? No - copy requested Yes - validated most recent copy scanned in chart (See row information) No - copy requested No - copy requested Yes - validated most recent copy scanned in chart (See row information) Yes - validated most recent copy scanned in chart (See row information) No - copy requested    Current Medications (verified) Outpatient Encounter Medications as of 09/03/2022  Medication Sig   acetaminophen (TYLENOL) 500 MG tablet  Take 1,000 mg by mouth 2 (two) times daily as needed for moderate pain or headache.   Calcium Carb-Cholecalciferol (CALCIUM 600 + D PO) Take 1 tablet by mouth daily.   carvedilol (COREG) 25 MG tablet TAKE 1 TABLET(25 MG) BY MOUTH TWICE DAILY WITH A MEAL   Cholecalciferol (VITAMIN D) 50 MCG (2000 UT) tablet Take 1 tablet (2,000 Units total) by mouth daily.   Co-Enzyme Q10 200 MG CAPS Take 200 mg by mouth daily.   diltiazem (CARDIZEM CD) 240 MG 24 hr capsule TAKE 1 CAPSULE(240 MG) BY MOUTH DAILY   ELIQUIS 5 MG TABS tablet TAKE 1 TABLET(5 MG) BY MOUTH TWICE DAILY   estradiol (ESTRACE) 0.1 MG/GM vaginal cream Place 1 Applicatorful vaginally 2 (two) times a week.   Hypromellose (ARTIFICIAL TEARS OP) Place 1 drop into both eyes as needed (dry eyes).   pantoprazole (PROTONIX) 40 MG tablet TAKE 1 TABLET(40 MG) BY MOUTH DAILY   Probiotic Product (PROBIOTIC DAILY PO) Take 1 capsule by mouth daily.    REPATHA SURECLICK [id number]/ML SOAJ INJECT 1 DOSE INTO THE SKIN EVERY 14 DAYS   Facility-Administered Encounter Medications as of 09/03/2022  Medication   regadenoson (LEXISCAN) injection SOLN 0.4 mg   technetium tetrofosmin (TC-MYOVIEW) injection 1AB-123456789millicurie   technetium tetrofosmin (TC-MYOVIEW) injection 3AB-123456789millicurie    Allergies (verified) Morphine and related, Statins, and Sulfa antibiotics   History: Past Medical History:  Diagnosis Date   Anxiety and depression    Arthritis    hands, knees, etc  Bladder prolapse, female, acquired 09/16/2014   Bowen's disease    Chronic atrial fibrillation (HCC)    Diverticulitis    Diverticulosis    GERD (gastroesophageal reflux disease)    H/O hematuria    for several years, work only revealed kidney stone on right side   Hepatic cyst    Hiatal hernia    History of chicken pox    History of kidney stones    Hyperlipidemia    Hypertension    IBS (irritable bowel syndrome)    Leg cramps, sleep related 04/02/2016   Myalgia 04/25/2017    Osteopenia 06/26/2014   Osteoporosis 06/26/2014   Plantar wart of left foot 10/15/2016   Renal cyst    Right shoulder pain 04/23/2017   SCC (squamous cell carcinoma)    arms   Statin intolerance 09/11/2019   Past Surgical History:  Procedure Laterality Date   CARDIOVERSION N/A 03/27/2022   Procedure: CARDIOVERSION;  Surgeon: Buford Dresser, MD;  Location: [facility];  Service: Cardiovascular;  Laterality: N/A;   CHOLECYSTECTOMY  06/30/2003   LOOP RECORDER INSERTION N/A 04/15/2018   Procedure: LOOP RECORDER INSERTION;  Surgeon: Sanda Klein, MD;  Location: [facility];  Service: Cardiovascular;  Laterality: N/A;   SKIN BIOPSY     multiple   SKIN CANCER EXCISION     forehead   TONSILLECTOMY     age 30   Family History  Problem Relation Age of Onset   Heart disease Mother        aortic stenosis   Hyperlipidemia Mother    Hypertension Mother    Heart attack Mother    Cancer Mother        BCC, SCC, skin   Hypertension Father    Cancer Father        SCC, BCC, skin   Stroke Brother    Heart disease Brother        arrythmia   Hypertension Brother    Hyperlipidemia Brother    Heart disease Maternal Grandfather        MI   Tuberculosis Paternal Grandmother    Cancer Paternal Grandfather        head and neck cancer, chewed tobacco   Hypertension Daughter    Colon polyps Daughter    Diverticulitis Daughter    Hyperlipidemia Daughter    Thyroid disease Daughter    Asthma Daughter    Kidney Stones Daughter    Heart disease Son    Heart attack Son    Birth defects Maternal Uncle    Colon cancer Maternal Uncle    Crohn's disease Niece    Social History   Socioeconomic History   Marital status: Single    Spouse name: Not on file   Number of children: 3   Years of education: Not on file   Highest education level: Not on file  Occupational History   Occupation: retired  Tobacco Use   Smoking status: Never   Smokeless tobacco: Never   Tobacco  comments:    Never smoke 03/18/22  Vaping Use   Vaping Use: Never used  Substance and Sexual Activity   Alcohol use: No    Alcohol/week: 0.0 standard drinks of alcohol   Drug use: No   Sexual activity: Never    Comment: divorced, widowed, lives with elderly parents, is the caregiver, no dietary restrictions.   Other Topics Concern   Not on file  Social History Narrative   Not on file   Social Determinants of  Health   Financial Resource Strain: Low Risk  (09/03/2022)   Overall Financial Resource Strain (CARDIA)    Difficulty of Paying Living Expenses: Not hard at all  Food Insecurity: No Food Insecurity (09/03/2022)   Hunger Vital Sign    Worried About Running Out of Food in the Last Year: Never true    Ran Out of Food in the Last Year: Never true  Transportation Needs: No Transportation Needs (09/03/2022)   PRAPARE - Hydrologist (Medical): No    Lack of Transportation (Non-Medical): No  Physical Activity: Sufficiently Active (09/01/2021)   Exercise Vital Sign    Days of Exercise per Week: 7 days    Minutes of Exercise per Session: 60 min  Stress: No Stress Concern Present (09/03/2022)   [place]    Feeling of Stress : Not at all  Social Connections: Moderately Integrated (09/01/2021)   Social Connection and Isolation Panel [NHANES]    Frequency of Communication with Friends and Family: More than three times a week    Frequency of Social Gatherings with Friends and Family: More than three times a week    Attends Religious Services: More than 4 times per year    Active Member of [employer] or Organizations: Yes    Attends Archivist Meetings: More than 4 times per year    Marital Status: Widowed    Tobacco Counseling Counseling given: Not Answered Tobacco comments: Never smoke 03/18/22   Clinical Intake:  Pre-visit preparation completed: Yes  Pain : No/denies pain  Diabetes:  No  How often do you need to have someone help you when you read instructions, pamphlets, or other written materials from your doctor or pharmacy?: 1 - Never   Activities of Daily Living    09/03/2022    1:45 PM  In your present state of health, do you have any difficulty performing the following activities:  Hearing? 1  Comment some slight hearing loss  Vision? 1  Comment has bilateral cataracts  Difficulty concentrating or making decisions? 0  Walking or climbing stairs? 0  Dressing or bathing? 0  Doing errands, shopping? 0  Preparing Food and eating ? N  Using the Toilet? N  In the past six months, have you accidently leaked urine? N  Do you have problems with loss of bowel control? N  Managing your Medications? N  Managing your Finances? N  Housekeeping or managing your Housekeeping? N    Patient Care Team: Mosie Lukes, MD as PCP - General (Family Medicine) Lorretta Harp, MD as PCP - Cardiology (Cardiology) Azucena Fallen, MD as Consulting Physician (Obstetrics and Gynecology) Rolm Bookbinder, MD as Consulting Physician (Dermatology) Katy Apo, MD as Consulting Physician (Ophthalmology)  Indicate any recent Medical Services you may have received from other than [facility] providers in the past year (date may be approximate).     Assessment:   This is a routine wellness examination for Araia.  Hearing/Vision screen No results found.  Dietary issues and exercise activities discussed: Current Exercise Habits: Home exercise routine, Type of exercise: stretching;walking, Time (Minutes): 30, Frequency (Times/Week): 5, Weekly Exercise (Minutes/Week): 150, Intensity: Mild, Exercise limited by: orthopedic condition(s)   Goals Addressed             This Visit's Progress    Maintain current healthy active life.   On track      Depression Screen    09/03/2022    1:44  PM 06/01/2022   10:43 AM 11/17/2021   10:56 AM 09/01/2021    1:49 PM 01/14/2021   11:51 AM 05/04/2019     2:39 PM 04/26/2018   10:45 AM  PHQ 2/9 Scores  PHQ - 2 Score 0 0 0 0 0 0 0  PHQ- 9 Score   0        Fall Risk    09/03/2022    1:40 PM 06/01/2022   10:43 AM 11/17/2021   12:38 PM 09/01/2021    1:48 PM 09/12/2020   11:37 AM  Fall Risk   Falls in the past year? 0 0 0 0 0  Number falls in past yr: 0 0 0 0 0  Injury with Fall? 0 0 0 0 0  Risk for fall due to : No Fall Risks   No Fall Risks   Follow up Falls evaluation completed Falls evaluation completed  Falls evaluation completed     FALL RISK PREVENTION PERTAINING TO THE HOME:  Any stairs in or around the home? Yes  If so, are there any without handrails? No  Home free of loose throw rugs in walkways, pet beds, electrical cords, etc? Yes  Adequate lighting in your home to reduce risk of falls? Yes   ASSISTIVE DEVICES UTILIZED TO PREVENT FALLS:  Life alert? No  Use of a cane, walker or w/c? No  Grab bars in the bathroom? No  Shower chair or bench in shower? Yes but not using it Elevated toilet seat or a handicapped toilet?  Comfort height  TIMED UP AND GO:  Was the test performed?  No, audio visit .   Cognitive Function:    12/15/2016   10:12 AM 06/13/2015   11:34 AM  MMSE - Mini Mental State Exam  Orientation to time 5 5  Orientation to Place 5 5  Registration 3 3  Attention/ Calculation 5 5  Recall 3 3  Language- name 2 objects 2 2  Language- repeat 1 1  Language- follow 3 step command 3 3  Language- read & follow direction 1 1  Write a sentence 1 1  Copy design 1 1  Total score 30 30        09/03/2022    1:57 PM  6CIT Screen  What Year? 0 points  What month? 0 points  What time? 0 points  Count back from 20 0 points  Months in reverse 0 points  Repeat phrase 0 points  Total Score 0 points    Immunizations Immunization History  Administered Date(s) Administered   Fluad Quad(high Dose 65+) 04/12/2019, 06/01/2022   Influenza, High Dose Seasonal PF 03/26/2017, 04/22/2018   Influenza,inj,Quad PF,6+  Mos 03/19/2015   Influenza-Unspecified 04/29/2014   PFIZER(Purple Top)SARS-COV-2 Vaccination 03/03/2020, 03/19/2020   Tdap 12/10/2016    TDAP status: Up to date  Flu Vaccine status: Up to date  Pneumococcal vaccine status: Due, Education has been provided regarding the importance of this vaccine. Advised may receive this vaccine at local pharmacy or Health Dept. Aware to provide a copy of the vaccination record if obtained from local pharmacy or Health Dept. Verbalized acceptance and understanding.  Covid-19 vaccine status: Information provided on how to obtain vaccines.   Qualifies for Shingles Vaccine? Yes   Zostavax completed No   Shingrix Completed?: No.    Education has been provided regarding the importance of this vaccine. Patient has been advised to call insurance company to determine out of pocket expense if they have not yet received  this vaccine. Advised may also receive vaccine at local pharmacy or Health Dept. Verbalized acceptance and understanding.  Screening Tests Health Maintenance  Topic Date Due   Zoster Vaccines- Shingrix (1 of 2) Never done   Pneumonia Vaccine 9+ Years old (1 of 1 - PCV) Never done   COVID-19 Vaccine (3 - Pfizer risk series) 04/16/2020   Medicare Annual Wellness (AWV)  09/03/2023   DTaP/Tdap/Td (2 - Td or Tdap) 12/11/2026   INFLUENZA VACCINE  Completed   DEXA SCAN  Completed   HPV VACCINES  Aged Out    Health Maintenance  Health Maintenance Due  Topic Date Due   Zoster Vaccines- Shingrix (1 of 2) Never done   Pneumonia Vaccine 62+ Years old (1 of 1 - PCV) Never done   COVID-19 Vaccine (3 - Pfizer risk series) 04/16/2020    Colorectal cancer screening: No longer required.   Mammogram status: No longer required due to having loop recorder.  Bone Density status: due but pt wants to wait and discuss with PCP at next appointment.  Lung Cancer Screening: (Low Dose CT Chest recommended if Age 51-80 years, 30 pack-year currently smoking OR  have quit w/in 15years.) does not qualify.   Additional Screening:  Hepatitis C Screening: does not qualify  Vision Screening: Recommended annual ophthalmology exams for early detection of glaucoma and other disorders of the eye. Is the patient up to date with their annual eye exam?  Yes  Who is the provider or what is the name of the office in which the patient attends annual eye exams? Dr. Nori [facility] If pt is not established with a provider, would they like to be referred to a provider to establish care? No .   Dental Screening: Recommended annual dental exams for proper oral hygiene  Community Resource Referral / Chronic Care Management: CRR required this visit?  No   CCM required this visit?  No      Plan:     I have personally reviewed and noted the following in the patient's chart:   Medical and social history Use of alcohol, tobacco or illicit drugs  Current medications and supplements including opioid prescriptions. Patient is not currently taking opioid prescriptions. Functional ability and status Nutritional status Physical activity Advanced directives List of other physicians Hospitalizations, surgeries, and ER visits in previous 12 months Vitals Screenings to include cognitive, depression, and falls Referrals and appointments  In addition, I have reviewed and discussed with patient certain preventive protocols, quality metrics, and best practice recommendations. A written personalized care plan for preventive services as well as general preventive health recommendations were provided to patient.   Due to this being a telephonic visit, the after visit summary with patients personalized plan was offered to patient via mail or my-chart. Patient would like to access on my-chart.   Beatris Ship, [place]   09/03/2022   Nurse Notes: None

## 2022-10-12 ENCOUNTER — Encounter: Payer: Self-pay | Admitting: *Deleted

## 2022-10-27 DIAGNOSIS — M940 Chondrocostal junction syndrome [Tietze]: Secondary | ICD-10-CM | POA: Diagnosis not present

## 2022-10-27 DIAGNOSIS — N952 Postmenopausal atrophic vaginitis: Secondary | ICD-10-CM | POA: Diagnosis not present

## 2022-10-27 DIAGNOSIS — N8110 Cystocele, unspecified: Secondary | ICD-10-CM | POA: Diagnosis not present

## 2022-10-27 DIAGNOSIS — N644 Mastodynia: Secondary | ICD-10-CM | POA: Diagnosis not present

## 2022-10-27 DIAGNOSIS — N898 Other specified noninflammatory disorders of vagina: Secondary | ICD-10-CM | POA: Diagnosis not present

## 2022-10-27 DIAGNOSIS — N814 Uterovaginal prolapse, unspecified: Secondary | ICD-10-CM | POA: Diagnosis not present

## 2022-11-18 ENCOUNTER — Other Ambulatory Visit: Payer: Self-pay | Admitting: Internal Medicine

## 2022-12-13 NOTE — Progress Notes (Unsigned)
Cardiology Clinic Note   Date: 12/14/2022 ID: XYLA KNITTER, DOB Jun 17, 1941, MRN [id number]  Primary Cardiologist:  Nanetta Batty, MD  Patient Profile    Sophia [name] is a 82 y.o. female who presents to the clinic today for routine follow up.     Past medical history significant for: Elevated coronary calcium score. CT cardiac scoring 01/24/2020: Coronary calcium score 304 (76 percentile).  Mildly dilated aorta 3.8 cm.  Aortic atherosclerosis. Nuclear stress test 05/01/2022: Normal, low risk study. Ascending aorta dilatation/mild AI. Echo 03/30/2022: EF 60 to 65%.  Mild LAE.  Mild AI.  Aortic valve sclerosis without stenosis.  Mild dilatation of ascending aorta 38 mm. PAF. ILR download 06/20/2018: A-fib episode 05/30/2018 lasting approximately 3 minutes. ILR downloads showed increasing episodes of PAF and patient was eventually started on anticoagulation. DCCV 03/27/2022. NSVT. Hypertension. Hyperlipidemia. Lipid panel 06/01/2022: LDL 77, HDL 58, TG 178, total 170. GERD. Squamous cell carcinoma. Near syncope. Loop recorder implantation 04/15/2018. Remote device check 12/08/2021: Normal implantable loop recorder download.  No clinically significant events.  Normal battery status.     History of Present Illness    Sophia [name] was first evaluated by Dr. Allyson Sabal on 02/25/2018 for chest pain as requested by the ER.  Patient had been seen in the emergency room at [facility] on 02/22/2018 with complaints of chest pain and palpitations.  Troponins were negative and her EKG showed no acute changes at that time.  At the time of her office visit she reported being evaluated by cardiologist in [place] for palpitations approximately 7 years prior.  She was placed on carvedilol with some improvement but symptoms have gotten worse recently.  She had bilateral carotid bruits on exam.  2-week cardiac monitor showed NSR/SB/ST with occasional PVCs and PACs, short run of nonsustained VT  and several short atrial runs.  Carotid ultrasound, echo and stress testing were normal.  Given findings of event monitor and history of near syncope a loop recorder was implanted in October 2019.  First run of A-fib detected 06/20/2018 lasting 3 minutes.  ILR detected longer runs of A-fib from that time through 2021.  She followed up with Dr. Allyson Sabal on 11/08/2019 after carvedilol was increased by Dr. Royann Shivers to 25 mg twice daily.  She was still exhibiting a slightly higher ventricular response and was symptomatic with atypical chest pain and fatigue.  She was referred to A-fib clinic and was evaluated by Jorja Loa, PA-C on 11/13/2019.  She was in sinus rhythm at that time.  She deferred the addition of AAD such as flecainide opting to continue her current medication regimen.  Continue to be followed by Dr. Allyson Sabal, Dr. Royann Shivers and A-fib clinic.  On 03/17/2022 during visit with Dr. Allyson Sabal patient was found to be in rate controlled A-fib with symptoms of fatigue and chest discomfort.  She was then evaluated by Jorja Loa on 03/26/2022 where she remained in A-fib and was scheduled for cardioversion.  Patient underwent successful DCCV on 03/27/2022.  Patient was last seen in the office by Dr. Allyson Sabal on 06/16/2022 for routine follow-up.  She was maintaining sinus rhythm at that time and and she was clinically improved.  No medication changes were made.  Today, patient is accompanied by her daughter. She is doing very well. Patient denies shortness of breath or dyspnea on exertion. No chest pain, pressure, or tightness. Denies lower extremity edema, orthopnea, or PND. She denies palpitations. She has cardiac awareness of afib and does not  feel she has had any other episodes since DCCV. She is active walking 1/2 mile 3-4 days a week.    ROS: All other systems reviewed and are otherwise negative except as noted in History of Present Illness.  Studies Reviewed   ECG is not performed today.   Risk  Assessment/Calculations     CHA2DS2-VASc Score = 4   This indicates a 4.8% annual risk of stroke. The patient's score is based upon: CHF History: 0 HTN History: 1 Diabetes History: 0 Stroke History: 0 Vascular Disease History: 0 Age Score: 2 Gender Score: 1             Physical Exam    VS:  BP 114/66 (BP Location: Left Arm, Patient Position: Sitting, Cuff Size: Normal)   Pulse 60   Ht 5\' 1"  (1.549 m)   Wt 143 lb 6.4 oz (65 kg)   SpO2 98%   BMI 27.10 kg/m  , BMI Body mass index is 27.1 kg/m.  GEN: Well nourished, well developed, in no acute distress. Neck: No JVD or carotid bruits. Cardiac:  RRR. No murmurs. No rubs or gallops.   Respiratory:  Respirations regular and unlabored. Clear to auscultation without rales, wheezing or rhonchi. GI: Soft, nontender, nondistended. Extremities: Radials/DP/PT 2+ and equal bilaterally. No clubbing or cyanosis. No edema.  Skin: Warm and dry, no rash. Neuro: Strength intact.  Assessment & Plan    PAF.  Current on implantable loop recorder December 2019.  Episodes increased and patient was eventually started on anticoagulation.  She underwent DCCV September 2023.  Patient denies further episodes of afib since DCCV. Denies spontaneous bleeding concerns. RRR on exam today.  Continue carvedilol, diltiazem, Eliquis. Appropriate Eliquis dose. Elevated coronary calcium score.  CT cardiac scoring July 2021 showed calcium score of 304.  Nuclear stress test November 2023 was a low risk normal study.  Patient denies chest pain, tightness, pressure. She walks 1/2 mile 3-4 days a week. Continue carvedilol, Repatha. Hypertension. BP today 114/66. Patient denies headaches, dizziness or vision changes. Continue carvedilol. Hyperlipidemia.  LDL December 2023 77.  Continue Repatha. Follow up with Dr. Rennis Golden in the lipid clinic in July.  Near syncope.  Loop recorder implantation October 2019.  Last remote check June 2023 showed no clinically significant  events and normal battery status.  Patient denies near syncope or syncope.   Disposition: Return in 6 months or sooner as needed. Keep follow up with lipid clinic in July.          Signed, Etta Grandchild. Azriella Mattia, DNP, NP-C

## 2022-12-13 NOTE — Assessment & Plan Note (Signed)
Supplement and monitor 

## 2022-12-13 NOTE — Assessment & Plan Note (Signed)
hgba1c acceptable, minimize simple carbs. Increase exercise as tolerated.  

## 2022-12-13 NOTE — Assessment & Plan Note (Signed)
Encourage heart healthy diet such as MIND or DASH diet, increase exercise, avoid trans fats, simple carbohydrates and processed foods, consider a krill or fish or flaxseed oil cap daily.  Tolerating Repatha 

## 2022-12-13 NOTE — Assessment & Plan Note (Signed)
Monitor and report any concerns. no changes to meds. Encouraged heart healthy diet such as the DASH diet and exercise as tolerated.  

## 2022-12-13 NOTE — Assessment & Plan Note (Signed)
Patient encouraged to maintain heart healthy diet, regular exercise, adequate sleep. Consider daily probiotics. Take medications as prescribed. Labs reviewed and ordered. Given and reviewed copy of ACP documents from [facility] Secretary of State and encouraged to complete and return  Has aged out of screening colonoscopy, MGM and paps. Dexa scan 2019

## 2022-12-13 NOTE — Assessment & Plan Note (Signed)
Does not tolerate statins.

## 2022-12-13 NOTE — Assessment & Plan Note (Signed)
Encouraged to get adequate exercise, calcium and vitamin d intake 

## 2022-12-14 ENCOUNTER — Encounter: Payer: Self-pay | Admitting: Student

## 2022-12-14 ENCOUNTER — Encounter: Payer: Self-pay | Admitting: Family Medicine

## 2022-12-14 ENCOUNTER — Ambulatory Visit: Payer: Medicare HMO | Attending: Student | Admitting: Student

## 2022-12-14 ENCOUNTER — Ambulatory Visit (INDEPENDENT_AMBULATORY_CARE_PROVIDER_SITE_OTHER): Payer: Medicare HMO | Admitting: Family Medicine

## 2022-12-14 VITALS — BP 118/64 | HR 61 | Temp 97.6°F | Resp 16 | Ht 61.0 in | Wt 142.4 lb

## 2022-12-14 VITALS — BP 114/66 | HR 60 | Ht 61.0 in | Wt 143.4 lb

## 2022-12-14 DIAGNOSIS — R55 Syncope and collapse: Secondary | ICD-10-CM | POA: Diagnosis not present

## 2022-12-14 DIAGNOSIS — H9190 Unspecified hearing loss, unspecified ear: Secondary | ICD-10-CM | POA: Diagnosis not present

## 2022-12-14 DIAGNOSIS — Z0000 Encounter for general adult medical examination without abnormal findings: Secondary | ICD-10-CM | POA: Diagnosis not present

## 2022-12-14 DIAGNOSIS — I10 Essential (primary) hypertension: Secondary | ICD-10-CM

## 2022-12-14 DIAGNOSIS — K219 Gastro-esophageal reflux disease without esophagitis: Secondary | ICD-10-CM | POA: Diagnosis not present

## 2022-12-14 DIAGNOSIS — E559 Vitamin D deficiency, unspecified: Secondary | ICD-10-CM

## 2022-12-14 DIAGNOSIS — Z789 Other specified health status: Secondary | ICD-10-CM

## 2022-12-14 DIAGNOSIS — E782 Mixed hyperlipidemia: Secondary | ICD-10-CM

## 2022-12-14 DIAGNOSIS — H6991 Unspecified Eustachian tube disorder, right ear: Secondary | ICD-10-CM

## 2022-12-14 DIAGNOSIS — E785 Hyperlipidemia, unspecified: Secondary | ICD-10-CM | POA: Diagnosis not present

## 2022-12-14 DIAGNOSIS — M810 Age-related osteoporosis without current pathological fracture: Secondary | ICD-10-CM | POA: Diagnosis not present

## 2022-12-14 DIAGNOSIS — I480 Paroxysmal atrial fibrillation: Secondary | ICD-10-CM

## 2022-12-14 DIAGNOSIS — R931 Abnormal findings on diagnostic imaging of heart and coronary circulation: Secondary | ICD-10-CM | POA: Diagnosis not present

## 2022-12-14 DIAGNOSIS — R739 Hyperglycemia, unspecified: Secondary | ICD-10-CM | POA: Diagnosis not present

## 2022-12-14 LAB — COMPREHENSIVE METABOLIC PANEL
ALT: 10 U/L (ref 0–35)
AST: 17 U/L (ref 0–37)
Albumin: 4.4 g/dL (ref 3.5–5.2)
Alkaline Phosphatase: 78 U/L (ref 39–117)
BUN: 11 mg/dL (ref 6–23)
CO2: 26 mEq/L (ref 19–32)
Calcium: 9.3 mg/dL (ref 8.4–10.5)
Chloride: 102 mEq/L (ref 96–112)
Creatinine, Ser: 0.98 mg/dL (ref 0.40–1.20)
GFR: 54.5 mL/min — ABNORMAL LOW (ref >60.00)
Glucose, Bld: 101 mg/dL — ABNORMAL HIGH (ref 70–99)
Potassium: 4 mEq/L (ref 3.5–5.1)
Sodium: 138 mEq/L (ref 135–145)
Total Bilirubin: 0.6 mg/dL (ref 0.2–1.2)
Total Protein: 7 g/dL (ref 6.0–8.3)

## 2022-12-14 LAB — CBC WITH DIFFERENTIAL/PLATELET
Basophils Absolute: 0 10*3/uL (ref 0.0–0.1)
Basophils Relative: 0.2 % (ref 0.0–3.0)
Eosinophils Absolute: 0 10*3/uL (ref 0.0–0.7)
Eosinophils Relative: 0.5 % (ref 0.0–5.0)
HCT: 38.5 % (ref 36.0–46.0)
Hemoglobin: 12.4 g/dL (ref 12.0–15.0)
Lymphocytes Relative: 27.2 % (ref 12.0–46.0)
Lymphs Abs: 2.1 10*3/uL (ref 0.7–4.0)
MCHC: 32.3 g/dL (ref 30.0–36.0)
MCV: 94.4 fl (ref 78.0–100.0)
Monocytes Absolute: 0.4 10*3/uL (ref 0.1–1.0)
Monocytes Relative: 5.2 % (ref 3.0–12.0)
Neutro Abs: 5.2 10*3/uL (ref 1.4–7.7)
Neutrophils Relative %: 66.9 % (ref 43.0–77.0)
Platelets: 190 10*3/uL (ref 150.0–400.0)
RBC: 4.07 Mil/uL (ref 3.87–5.11)
RDW: 13.5 % (ref 11.5–15.5)
WBC: 7.8 10*3/uL (ref 4.0–10.5)

## 2022-12-14 LAB — LIPID PANEL
Cholesterol: 160 mg/dL (ref 0–200)
HDL: 50.5 mg/dL (ref >39.00)
LDL Cholesterol: 75 mg/dL (ref 0–99)
NonHDL: 109.56
Total CHOL/HDL Ratio: 3
Triglycerides: 173 mg/dL — ABNORMAL HIGH (ref 0.0–149.0)
VLDL: 34.6 mg/dL (ref 0.0–40.0)

## 2022-12-14 LAB — HEMOGLOBIN A1C: Hgb A1c MFr Bld: 5.7 % (ref 4.6–6.5)

## 2022-12-14 LAB — TSH: TSH: 1.78 u[IU]/mL (ref 0.35–5.50)

## 2022-12-14 LAB — VITAMIN D 25 HYDROXY (VIT D DEFICIENCY, FRACTURES): VITD: 57.03 ng/mL (ref 30.00–100.00)

## 2022-12-14 MED ORDER — FAMOTIDINE 20 MG PO TABS: 20.0000 mg | ORAL_TABLET | Freq: Two times a day (BID) | ORAL | 3 refills | Status: DC | PRN

## 2022-12-14 NOTE — Patient Instructions (Signed)
Medication Instructions:  Your physician recommends that you continue on your current medications as directed. Please refer to the Current Medication list given to you today.   *If you need a refill on your cardiac medications before your next appointment, please call your pharmacy*   Lab Work: NONE ordered at this time of appointment   If you have labs (blood work) drawn today and your tests are completely normal, you will receive your results only by: MyChart Message (if you have MyChart) OR A paper copy in the mail If you have any lab test that is abnormal or we need to change your treatment, we will call you to review the results.   Testing/Procedures: NONE ordered at this time of appointment     Follow-Up: At [facility], you and your health needs are our priority.  As part of our continuing mission to provide you with exceptional heart care, we have created designated Provider Care Teams.  These Care Teams include your primary Cardiologist (physician) and Advanced Practice Providers (APPs -  Physician Assistants and Nurse Practitioners) who all work together to provide you with the care you need, when you need it.  We recommend signing up for the patient portal called "MyChart".  Sign up information is provided on this After Visit Summary.  MyChart is used to connect with patients for Virtual Visits (Telemedicine).  Patients are able to view lab/test results, encounter notes, upcoming appointments, etc.  Non-urgent messages can be sent to your provider as well.   To learn more about what you can do with MyChart, go to [email or web address].    Your next appointment:   6 month(s)  Provider:   Jonathan Berry, MD     Other Instructions   

## 2022-12-14 NOTE — Patient Instructions (Addendum)
Shingrix is the new shingles shot, 2 shots over 2-6 months, confirm coverage with insurance and document, then can return here for shots with nurse appt or at pharmacy   RSV, Respiratory Syncitial Virus Vaccine, Arexvy at pharmacy  COVID and flu boosters in fall Tetanus if injured or in 2028   Preventive Care 65 Years and Older, Female Preventive care refers to lifestyle choices and visits with your health care provider that can promote health and wellness. Preventive care visits are also called wellness exams. What can I expect for my preventive care visit? Counseling Your health care provider may ask you questions about your: Medical history, including: Past medical problems. Family medical history. Pregnancy and menstrual history. History of falls. Current health, including: Memory and ability to understand (cognition). Emotional well-being. Home life and relationship well-being. Sexual activity and sexual health. Lifestyle, including: Alcohol, nicotine or tobacco, and drug use. Access to firearms. Diet, exercise, and sleep habits. Work and work Astronomer. Sunscreen use. Safety issues such as seatbelt and bike helmet use. Physical exam Your health care provider will check your: Height and weight. These may be used to calculate your BMI (body mass index). BMI is a measurement that tells if you are at a healthy weight. Waist circumference. This measures the distance around your waistline. This measurement also tells if you are at a healthy weight and may help predict your risk of certain diseases, such as type 2 diabetes and high blood pressure. Heart rate and blood pressure. Body temperature. Skin for abnormal spots. What immunizations do I need?  Vaccines are usually given at various ages, according to a schedule. Your health care provider will recommend vaccines for you based on your age, medical history, and lifestyle or other factors, such as travel or where you  work. What tests do I need? Screening Your health care provider may recommend screening tests for certain conditions. This may include: Lipid and cholesterol levels. Hepatitis C test. Hepatitis B test. HIV (human immunodeficiency virus) test. STI (sexually transmitted infection) testing, if you are at risk. Lung cancer screening. Colorectal cancer screening. Diabetes screening. This is done by checking your blood sugar (glucose) after you have not eaten for a while (fasting). Mammogram. Talk with your health care provider about how often you should have regular mammograms. BRCA-related cancer screening. This may be done if you have a family history of breast, ovarian, tubal, or peritoneal cancers. Bone density scan. This is done to screen for osteoporosis. Talk with your health care provider about your test results, treatment options, and if necessary, the need for more tests. Follow these instructions at home: Eating and drinking  Eat a diet that includes fresh fruits and vegetables, whole grains, lean protein, and low-fat dairy products. Limit your intake of foods with high amounts of sugar, saturated fats, and salt. Take vitamin and mineral supplements as recommended by your health care provider. Do not drink alcohol if your health care provider tells you not to drink. If you drink alcohol: Limit how much you have to 0-1 drink a day. Know how much alcohol is in your drink. In the [place] one drink equals one 12 oz bottle of beer (355 mL), one 5 oz glass of wine (148 mL), or one 1 oz glass of hard liquor (44 mL). Lifestyle Brush your teeth every morning and night with fluoride toothpaste. Floss one time each day. Exercise for at least 30 minutes 5 or more days each week. Do not use any products that contain nicotine or  tobacco. These products include cigarettes, chewing tobacco, and vaping devices, such as e-cigarettes. If you need help quitting, ask your health care provider. Do not  use drugs. If you are sexually active, practice safe sex. Use a condom or other form of protection in order to prevent STIs. Take aspirin only as told by your health care provider. Make sure that you understand how much to take and what form to take. Work with your health care provider to find out whether it is safe and beneficial for you to take aspirin daily. Ask your health care provider if you need to take a cholesterol-lowering medicine (statin). Find healthy ways to manage stress, such as: Meditation, yoga, or listening to music. Journaling. Talking to a trusted person. Spending time with friends and family. Minimize exposure to UV radiation to reduce your risk of skin cancer. Safety Always wear your seat belt while driving or riding in a vehicle. Do not drive: If you have been drinking alcohol. Do not ride with someone who has been drinking. When you are tired or distracted. While texting. If you have been using any mind-altering substances or drugs. Wear a helmet and other protective equipment during sports activities. If you have firearms in your house, make sure you follow all gun safety procedures. What's next? Visit your health care provider once a year for an annual wellness visit. Ask your health care provider how often you should have your eyes and teeth checked. Stay up to date on all vaccines. This information is not intended to replace advice given to you by your health care provider. Make sure you discuss any questions you have with your health care provider. Document Revised: 12/11/2020 Document Reviewed: 12/11/2020 Elsevier Patient Education  2024 [employer].

## 2022-12-14 NOTE — Progress Notes (Signed)
Subjective:    Patient ID: Sophia [name], female    DOB: 12-Jan-1941, 82 y.o.   MRN: [id number]  Chief Complaint  Patient presents with   Annual Exam    Annual Exam    HPI Discussed the use of AI scribe software for clinical note transcription with the patient, who gave verbal consent to proceed.  History of Present Illness   Patient is in today follow up on chronic medical concerns and annual preventative exam. No recent febrile illness or hospitalizations. Denies CP/palp/SOB/HA/congestion/fevers/GI or GU c/o. Taking meds as prescribed  The patient, with a history of hyperlipidemia, osteoporosis, and ear issues, presents for a routine check-up. The patient reports a feeling of congestion and muffled hearing in the right ear, describing it as "stopped up". Despite using Debrox for wax removal, the patient has not noticed any improvement. The patient denies any pain, discharge, or other associated symptoms.  The patient also expresses concern about potential weight gain associated with pantoprazole, a medication they have been taking for acid reflux. Despite dieting, the patient has noticed an increase in weight. The patient also mentions concerns about the potential impact of pantoprazole on osteoporosis, a condition they are already at risk for.  The patient is also on Repatha for hyperlipidemia and reports no issues with this medication. They are due to see a specialist for a routine check-up of this condition.        Past Medical History:  Diagnosis Date   Anxiety and depression    Arthritis    hands, knees, etc   Bladder prolapse, female, acquired 09/16/2014   Bowen's disease    Chronic atrial fibrillation (HCC)    Diverticulitis    Diverticulosis    GERD (gastroesophageal reflux disease)    H/O hematuria    for several years, work only revealed kidney stone on right side   Hepatic cyst    Hiatal hernia    History of chicken pox    History of kidney stones     Hyperlipidemia    Hypertension    IBS (irritable bowel syndrome)    Leg cramps, sleep related 04/02/2016   Myalgia 04/25/2017   Osteopenia 06/26/2014   Osteoporosis 06/26/2014   Plantar wart of left foot 10/15/2016   Renal cyst    Right shoulder pain 04/23/2017   SCC (squamous cell carcinoma)    arms   Statin intolerance 09/11/2019    Past Surgical History:  Procedure Laterality Date   CARDIOVERSION N/A 03/27/2022   Procedure: CARDIOVERSION;  Surgeon: Jodelle Red, MD;  Location[facility] ENDOSCOPY;  Service: Cardiovascular;  Laterality: N/A;   CHOLECYSTECTOMY  06/30/2003   LOOP RECORDER INSERTION N/A 04/15/2018   Procedure: LOOP RECORDER INSERTION;  Surgeon: Thurmon Fair, MD;  Location: [facility] INVASIVE CV LAB;  Service: Cardiovascular;  Laterality: N/A;   SKIN BIOPSY     multiple   SKIN CANCER EXCISION     forehead   TONSILLECTOMY     age 83    Family History  Problem Relation Age of Onset   Heart disease Mother        aortic stenosis   Hyperlipidemia Mother    Hypertension Mother    Heart attack Mother    Cancer Mother        BCC, SCC, skin   Hypertension Father    Cancer Father        SCC, BCC, skin   Stroke Brother    Heart disease Brother  arrythmia   Hypertension Brother    Hyperlipidemia Brother    Heart disease Maternal Grandfather        MI   Tuberculosis Paternal Grandmother    Cancer Paternal Grandfather        head and neck cancer, chewed tobacco   Hypertension Daughter    Colon polyps Daughter    Diverticulitis Daughter    Hyperlipidemia Daughter    Thyroid disease Daughter    Asthma Daughter    Kidney Stones Daughter    Heart disease Son    Heart attack Son    Birth defects Maternal Uncle    Colon cancer Maternal Uncle    Crohn's disease Niece     Social History   Socioeconomic History   Marital status: Single    Spouse name: Not on file   Number of children: 3   Years of education: Not on file   Highest education level:  Not on file  Occupational History   Occupation: retired  Tobacco Use   Smoking status: Never   Smokeless tobacco: Never   Tobacco comments:    Never smoke 03/18/22  Vaping Use   Vaping Use: Never used  Substance and Sexual Activity   Alcohol use: No    Alcohol/week: 0.0 standard drinks of alcohol   Drug use: No   Sexual activity: Never    Comment: divorced, widowed, lives with elderly parents, is the caregiver, no dietary restrictions.   Other Topics Concern   Not on file  Social History Narrative   Not on file   Social Determinants of Health   Financial Resource Strain: Low Risk  (09/03/2022)   Overall Financial Resource Strain (CARDIA)    Difficulty of Paying Living Expenses: Not hard at all  Food Insecurity: No Food Insecurity (09/03/2022)   Hunger Vital Sign    Worried About Running Out of Food in the Last Year: Never true    Ran Out of Food in the Last Year: Never true  Transportation Needs: No Transportation Needs (09/03/2022)   PRAPARE - Administrator, Civil Service (Medical): No    Lack of Transportation (Non-Medical): No  Physical Activity: Sufficiently Active (09/01/2021)   Exercise Vital Sign    Days of Exercise per Week: 7 days    Minutes of Exercise per Session: 60 min  Stress: No Stress Concern Present (09/03/2022)   [employer] of Occupational Health - Occupational Stress Questionnaire    Feeling of Stress : Not at all  Social Connections: Moderately Integrated (09/01/2021)   Social Connection and Isolation Panel [NHANES]    Frequency of Communication with Friends and Family: More than three times a week    Frequency of Social Gatherings with Friends and Family: More than three times a week    Attends Religious Services: More than 4 times per year    Active Member of [employer] or Organizations: Yes    Attends Banker Meetings: More than 4 times per year    Marital Status: Widowed  Intimate Partner Violence: Not At Risk (09/03/2022)    Humiliation, Afraid, Rape, and Kick questionnaire    Fear of Current or Ex-Partner: No    Emotionally Abused: No    Physically Abused: No    Sexually Abused: No    Outpatient Medications Prior to Visit  Medication Sig Dispense Refill   acetaminophen (TYLENOL) 500 MG tablet Take 1,000 mg by mouth 2 (two) times daily as needed for moderate pain or headache.  Calcium Carb-Cholecalciferol (CALCIUM 600 + D PO) Take 1 tablet by mouth daily.     carvedilol (COREG) 25 MG tablet TAKE 1 TABLET(25 MG) BY MOUTH TWICE DAILY WITH A MEAL 180 tablet 3   Cholecalciferol (VITAMIN D) 50 MCG (2000 UT) tablet Take 1 tablet (2,000 Units total) by mouth daily.     Co-Enzyme Q10 200 MG CAPS Take 200 mg by mouth daily.     diltiazem (CARDIZEM CD) 240 MG 24 hr capsule TAKE 1 CAPSULE(240 MG) BY MOUTH DAILY 90 capsule 3   ELIQUIS 5 MG TABS tablet TAKE 1 TABLET(5 MG) BY MOUTH TWICE DAILY 180 tablet 1   estradiol (ESTRACE) 0.1 MG/GM vaginal cream Place 1 Applicatorful vaginally 2 (two) times a week.     Hypromellose (ARTIFICIAL TEARS OP) Place 1 drop into both eyes as needed (dry eyes).     Probiotic Product (PROBIOTIC DAILY PO) Take 1 capsule by mouth daily.      REPATHA SURECLICK 140 MG/ML SOAJ INJECT 1 DOSE INTO SKIN AS DIRECTED FOR 14 DAYS 2 mL 11   pantoprazole (PROTONIX) 40 MG tablet TAKE 1 TABLET(40 MG) BY MOUTH DAILY 30 tablet 5   Facility-Administered Medications Prior to Visit  Medication Dose Route Frequency Provider Last Rate Last Admin   regadenoson (LEXISCAN) injection SOLN 0.4 mg  0.4 mg Intravenous Once Chandrasekhar, Mahesh A, MD       technetium tetrofosmin (TC-MYOVIEW) injection 10.1 millicurie  10.1 millicurie Intravenous Once PRN Chandrasekhar, Mahesh A, MD       technetium tetrofosmin (TC-MYOVIEW) injection 32.7 millicurie  32.7 millicurie Intravenous Once PRN Chandrasekhar, Mahesh A, MD        Allergies  Allergen Reactions   Morphine And Codeine Swelling   Statins Other (See Comments)     Myalgias, weakness (atorvastatin, RYR, rosuvastatin, pravastatin)   Sulfa Antibiotics Swelling and Rash    Review of Systems  Constitutional:  Negative for fever and malaise/fatigue.  HENT:  Positive for ear pain and hearing loss. Negative for congestion, ear discharge, sinus pain and tinnitus.   Eyes:  Negative for blurred vision.  Respiratory:  Negative for shortness of breath.   Cardiovascular:  Negative for chest pain, palpitations and leg swelling.  Gastrointestinal:  Negative for abdominal pain, blood in stool and nausea.  Genitourinary:  Negative for dysuria and frequency.  Musculoskeletal:  Negative for falls.  Skin:  Negative for rash.  Neurological:  Negative for dizziness, loss of consciousness and headaches.  Endo/Heme/Allergies:  Negative for environmental allergies.  Psychiatric/Behavioral:  Negative for depression. The patient is not nervous/anxious.        Objective:    Physical Exam Constitutional:      General: She is not in acute distress.    Appearance: Normal appearance. She is not diaphoretic.  HENT:     Head: Normocephalic and atraumatic.     Right Ear: Tympanic membrane, ear canal and external ear normal.     Left Ear: Tympanic membrane, ear canal and external ear normal.     Nose: Nose normal.     Mouth/Throat:     Mouth: Mucous membranes are moist.     Pharynx: Oropharynx is clear. No oropharyngeal exudate.  Eyes:     General: No scleral icterus.       Right eye: No discharge.        Left eye: No discharge.     Conjunctiva/sclera: Conjunctivae normal.     Pupils: Pupils are equal, round, and reactive to light.  Neck:  Thyroid: No thyromegaly.  Cardiovascular:     Rate and Rhythm: Normal rate and regular rhythm.     Heart sounds: Normal heart sounds. No murmur heard. Pulmonary:     Effort: Pulmonary effort is normal. No respiratory distress.     Breath sounds: Normal breath sounds. No wheezing or rales.  Abdominal:     General: Bowel sounds  are normal. There is no distension.     Palpations: Abdomen is soft. There is no mass.     Tenderness: There is no abdominal tenderness.  Musculoskeletal:        General: No tenderness. Normal range of motion.     Cervical back: Normal range of motion and neck supple.  Lymphadenopathy:     Cervical: No cervical adenopathy.  Skin:    General: Skin is warm and dry.     Findings: No rash.  Neurological:     General: No focal deficit present.     Mental Status: She is alert and oriented to person, place, and time.     Cranial Nerves: No cranial nerve deficit.     Coordination: Coordination normal.     Deep Tendon Reflexes: Reflexes are normal and symmetric. Reflexes normal.  Psychiatric:        Mood and Affect: Mood normal.        Behavior: Behavior normal.        Thought Content: Thought content normal.        Judgment: Judgment normal.     BP 118/64 (BP Location: Left Arm, Patient Position: Sitting, Cuff Size: Normal)   Pulse 61   Temp 97.6 F (36.4 C) (Oral)   Resp 16   Ht 5\' 1"  (1.549 m)   Wt 142 lb 6.4 oz (64.6 kg)   SpO2 99%   BMI 26.91 kg/m  Wt Readings from Last 3 Encounters:  12/14/22 143 lb 6.4 oz (65 kg)  12/14/22 142 lb 6.4 oz (64.6 kg)  06/16/22 139 lb 9.6 oz (63.3 kg)    Diabetic Foot Exam - Simple   No data filed    Lab Results  Component Value Date   WBC 7.8 12/14/2022   HGB 12.4 12/14/2022   HCT 38.5 12/14/2022   PLT 190.0 12/14/2022   GLUCOSE 101 (H) 12/14/2022   CHOL 160 12/14/2022   TRIG 173.0 (H) 12/14/2022   HDL 50.50 12/14/2022   LDLDIRECT 137.0 12/25/2019   LDLCALC 75 12/14/2022   ALT 10 12/14/2022   AST 17 12/14/2022   NA 138 12/14/2022   K 4.0 12/14/2022   CL 102 12/14/2022   CREATININE 0.98 12/14/2022   BUN 11 12/14/2022   CO2 26 12/14/2022   TSH 1.78 12/14/2022   HGBA1C 5.7 12/14/2022    Lab Results  Component Value Date   TSH 1.78 12/14/2022   Lab Results  Component Value Date   WBC 7.8 12/14/2022   HGB 12.4  12/14/2022   HCT 38.5 12/14/2022   MCV 94.4 12/14/2022   PLT 190.0 12/14/2022   Lab Results  Component Value Date   NA 138 12/14/2022   K 4.0 12/14/2022   CO2 26 12/14/2022   GLUCOSE 101 (H) 12/14/2022   BUN 11 12/14/2022   CREATININE 0.98 12/14/2022   BILITOT 0.6 12/14/2022   ALKPHOS 78 12/14/2022   AST 17 12/14/2022   ALT 10 12/14/2022   PROT 7.0 12/14/2022   ALBUMIN 4.4 12/14/2022   CALCIUM 9.3 12/14/2022   ANIONGAP 7 03/18/2022   GFR 54.50 (L) 12/14/2022  Lab Results  Component Value Date   CHOL 160 12/14/2022   Lab Results  Component Value Date   HDL 50.50 12/14/2022   Lab Results  Component Value Date   LDLCALC 75 12/14/2022   Lab Results  Component Value Date   TRIG 173.0 (H) 12/14/2022   Lab Results  Component Value Date   CHOLHDL 3 12/14/2022   Lab Results  Component Value Date   HGBA1C 5.7 12/14/2022       Assessment & Plan:  Primary hypertension Assessment & Plan: Monitor and report any concerns. no changes to meds. Encouraged heart healthy diet such as the DASH diet and exercise as tolerated.   Orders: -     CBC with Differential/Platelet -     Comprehensive metabolic panel -     TSH  Mixed hyperlipidemia Assessment & Plan: Encourage heart healthy diet such as MIND or DASH diet, increase exercise, avoid trans fats, simple carbohydrates and processed foods, consider a krill or fish or flaxseed oil cap daily.  Tolerating Repatha  Orders: -     VITAMIN D 25 Hydroxy (Vit-D Deficiency, Fractures)  Age related osteoporosis, unspecified pathological fracture presence Assessment & Plan: Encouraged to get adequate exercise, calcium and vitamin d intake   Orders: -     Hemoglobin A1c  Hyperglycemia Assessment & Plan: hgba1c acceptable, minimize simple carbs. Increase exercise as tolerated.    Statin intolerance Assessment & Plan: Does not tolerate statins   Vitamin D deficiency Assessment & Plan: Supplement and  monitor  Orders: -     Lipid panel  Preventative health care Assessment & Plan: Patient encouraged to maintain heart healthy diet, regular exercise, adequate sleep. Consider daily probiotics. Take medications as prescribed. Labs reviewed and ordered. Given and reviewed copy of ACP documents from [facility] Secretary of State and encouraged to complete and return  Has aged out of screening colonoscopy, MGM and paps. Dexa scan 2019   Hearing loss, unspecified hearing loss type, unspecified laterality -     Ambulatory referral to ENT  Dysfunction of right eustachian tube -     Ambulatory referral to ENT  Gastroesophageal reflux disease, unspecified whether esophagitis present Assessment & Plan: Avoid offending foods, start probiotics. Do not eat large meals in late evening and consider raising head of bed.  Pantoprazole helps the heartburn but she is worried about osteoporosis will change to Famotidine 20 bid prn   Osteoporosis, unspecified osteoporosis type, unspecified pathological fracture presence Assessment & Plan: Encouraged to get adequate exercise, calcium and vitamin d intake   Orders: -     DG Bone Density; Future  ETD (Eustachian tube dysfunction), right Assessment & Plan: Pressure in right ear and some sense of decreased hearing. She thought it was gonna be wax but there is no significant wax in either ear. Referred to ENT for further evaluation.   Other orders -     Famotidine; Take 1 tablet (20 mg total) by mouth 2 (two) times daily as needed for heartburn or indigestion.  Dispense: 60 tablet; Refill: 3    Assessment and Plan    Eustachian Tube Dysfunction: Right ear congestion and hearing loss. No signs of inflammation or infection. Likely due to Eustachian tube blockage. -Refer to ENT for further evaluation and management. -Continue Debrox for wax removal. -Start nasal saline flush twice daily until ENT appointment.  Gastroesophageal Reflux Disease: Concerns about  weight gain on Pantoprazole. No current symptoms of acid reflux. -Switch from Pantoprazole 40mg  to Famotidine 20mg  twice daily  for one week, then decrease to 20mg  once daily.  Hyperlipidemia: On Repatha with no reported side effects. -Order cholesterol panel to monitor response to therapy.  Osteoporosis: Concerns about bone health. Last DEXA scan five years ago. -Order DEXA scan.  Vaccinations: Due for Pneumonia and Tetanus vaccines. Discussed new RSV vaccine and potential for COVID and flu boosters in the fall. -Administer Prevnar 20 today. -Consider RSV vaccine. -Plan for potential COVID and flu boosters in the fall. -Tetanus booster due in 2028 or earlier if significant injury occurs.  General Health Maintenance: -Order routine labs. -Continue current lifestyle modifications for overall health maintenance. -Follow-up in six months.         Danise Edge, MD

## 2022-12-14 NOTE — Assessment & Plan Note (Addendum)
Pressure in right ear and some sense of decreased hearing. She thought it was gonna be wax but there is no significant wax in either ear. Referred to ENT for further evaluation.

## 2022-12-14 NOTE — Assessment & Plan Note (Addendum)
Avoid offending foods, start probiotics. Do not eat large meals in late evening and consider raising head of bed.  Pantoprazole helps the heartburn but she is worried about osteoporosis will change to Famotidine 20 bid prn

## 2022-12-21 DIAGNOSIS — L821 Other seborrheic keratosis: Secondary | ICD-10-CM | POA: Diagnosis not present

## 2022-12-21 DIAGNOSIS — L570 Actinic keratosis: Secondary | ICD-10-CM | POA: Diagnosis not present

## 2022-12-21 DIAGNOSIS — Z85828 Personal history of other malignant neoplasm of skin: Secondary | ICD-10-CM | POA: Diagnosis not present

## 2022-12-21 DIAGNOSIS — L814 Other melanin hyperpigmentation: Secondary | ICD-10-CM | POA: Diagnosis not present

## 2022-12-21 DIAGNOSIS — D2262 Melanocytic nevi of left upper limb, including shoulder: Secondary | ICD-10-CM | POA: Diagnosis not present

## 2022-12-21 DIAGNOSIS — D485 Neoplasm of uncertain behavior of skin: Secondary | ICD-10-CM | POA: Diagnosis not present

## 2022-12-21 DIAGNOSIS — L820 Inflamed seborrheic keratosis: Secondary | ICD-10-CM | POA: Diagnosis not present

## 2022-12-25 ENCOUNTER — Encounter: Payer: Self-pay | Admitting: Internal Medicine

## 2022-12-28 ENCOUNTER — Ambulatory Visit (HOSPITAL_BASED_OUTPATIENT_CLINIC_OR_DEPARTMENT_OTHER)
Admission: RE | Admit: 2022-12-28 | Discharge: 2022-12-28 | Disposition: A | Discharge status: Home / Self Care | Payer: Medicare HMO | Source: Ambulatory Visit | Attending: Family Medicine | Admitting: Family Medicine

## 2022-12-28 DIAGNOSIS — M810 Age-related osteoporosis without current pathological fracture: Secondary | ICD-10-CM | POA: Diagnosis not present

## 2022-12-28 DIAGNOSIS — Z780 Asymptomatic menopausal state: Secondary | ICD-10-CM | POA: Diagnosis not present

## 2022-12-28 NOTE — Telephone Encounter (Signed)
Patient had labs with PCP CBC, CMP, Lipid, TSH, Vit D, HgA1C.  Will any others be needed for her visit with you 7/24?

## 2023-01-20 ENCOUNTER — Ambulatory Visit: Payer: Medicare HMO | Admitting: Internal Medicine

## 2023-01-25 ENCOUNTER — Other Ambulatory Visit: Payer: Self-pay | Admitting: Cardiovascular Disease

## 2023-01-25 DIAGNOSIS — I480 Paroxysmal atrial fibrillation: Secondary | ICD-10-CM

## 2023-01-25 NOTE — Telephone Encounter (Signed)
Prescription refill request for Eliquis received. Indication:AFIB Last office visit:6/24 Scr:0.98  6/24 Age: 82 Weight:65  kg  Prescription refilled

## 2023-02-02 NOTE — Progress Notes (Unsigned)
Cardiology Office Note:  .   Date:  02/03/2023  ID:  Sophia [name], DOB April 10, 1941, MRN [id number] PCP: Bradd Canary, MD  [facility] Providers Cardiologist:  Nanetta Batty, MD    Patient Profile: .      PMH Dyslipidemia Elevated coronary calcium score CT Calcium score 304 (76th percentile) on 01/24/2020 Low risk myoview 05/01/22 Aortic atherosclerosis Family history of CAD Hypertension Atrial fibrillation on chronic anticoagulation Aortic valve sclerosis without evidence of AV stenosis on echo 03/30/22 Statin myalgia Intolerance to atorvastatin, red yeast rice, rosuvastatin and pravastatin  [facility] and seen by Dr. Rennis Golden, initially on 01/05/2020. She had hx of mixed dyslipidemia with intolerance of multiple statins. Started on Repatha with significant improvement in lipids. Triglycerides remained a little elevated at time of last visit with Dr. Rennis Golden on 10/01/2021.  She was working on diet.       History of Present Illness: .   Sophia [name] is a very pleasant 82 y.o. female  who is here today with her daughter, Sophia [name].  She reports she is feeling well and continues to walk for exercise. Admits she has hip pain but does not specifically associated with use of Repatha. She also has arthritis. Pain is not limiting her physical activity or impacting her quality of life.  She eats a rather healthy diet and drinks only water or hot tea. She cannot tolerate raw vegetables or seeds.  BP initially elevated due to rushing to get here because they got lost.  It improved on my recheck. We discussed mildly elevated TGs. There are no obvious culprits to explain the slight elevation and reassurance was provided.   Labs from 12/14/22: TC 160, HDL 50.0, LDL 75, triglycerides 173  ROS: See HPI       Studies Reviewed: .        Risk Assessment/Calculations:             Physical Exam:   VS:  BP 118/80   Pulse 79   Ht 5\' 1"  (1.549 m)   Wt 142 lb (64.4 kg)    BMI 26.83 kg/m    Wt Readings from Last 3 Encounters:  02/03/23 142 lb (64.4 kg)  12/14/22 143 lb 6.4 oz (65 kg)  12/14/22 142 lb 6.4 oz (64.6 kg)    GEN: Well nourished, well developed in no acute distress NECK: No JVD; No carotid bruits CARDIAC: RRR EXTREMITIES:  No edema; No deformity     ASSESSMENT AND PLAN: .    Mixed dyslipidemia LDL goal < 70: LDL 75, triglycerides 173 on 12/14/22.  LDL is close to goal.  She eats a relatively clean diet but is limited on consumption of some vegetables and fruits by her  diverticulitis. Triglycerides continue to be mildly elevated.  We discussed diet and there are no obvious culprits.  Encouragement given that it is only a mild elevation. She is feeling well on Repatha with no adverse side effects. She is walking for exercise. Encouragement given to continue healthy lifestyle. Continue Repatha. We will see her back in 1 year with fasting lipids prior.   CAD/Aortic atherosclerosis: Coronary calcium score of 304 (76 percentile), aortic atherosclerosis completed on 01/24/20. She denies chest pain, dyspnea, or other symptoms concerning for angina.  No indication for further ischemic evaluation at this time.  She is not on aspirin due to need for [facility].          Dispo: 1 year - plan to order  labs prior to appointment if not completed by PCP  Signed, Eligha Bridegroom, NP-C

## 2023-02-03 ENCOUNTER — Ambulatory Visit (HOSPITAL_BASED_OUTPATIENT_CLINIC_OR_DEPARTMENT_OTHER): Payer: Medicare HMO | Admitting: Nurse Practitioner

## 2023-02-03 ENCOUNTER — Encounter (HOSPITAL_BASED_OUTPATIENT_CLINIC_OR_DEPARTMENT_OTHER): Payer: Self-pay | Admitting: Nurse Practitioner

## 2023-02-03 VITALS — BP 118/80 | HR 79 | Ht 61.0 in | Wt 142.0 lb

## 2023-02-03 DIAGNOSIS — I700 Atherosclerosis of aorta: Secondary | ICD-10-CM

## 2023-02-03 DIAGNOSIS — E785 Hyperlipidemia, unspecified: Secondary | ICD-10-CM

## 2023-02-03 DIAGNOSIS — I2510 Atherosclerotic heart disease of native coronary artery without angina pectoris: Secondary | ICD-10-CM

## 2023-02-03 NOTE — Patient Instructions (Signed)
Medication Instructions:   Your physician recommends that you continue on your current medications as directed. Please refer to the Current Medication list given to you today.   *If you need a refill on your cardiac medications before your next appointment, please call your pharmacy*   Lab Work:  None ordered.  If you have labs (blood work) drawn today and your tests are completely normal, you will receive your results only by: MyChart Message (if you have MyChart) OR A paper copy in the mail If you have any lab test that is abnormal or we need to change your treatment, we will call you to review the results.   Testing/Procedures:  None ordered.   Follow-Up: At [facility], you and your health needs are our priority.  As part of our continuing mission to provide you with exceptional heart care, we have created designated Provider Care Teams.  These Care Teams include your primary Cardiologist (physician) and Advanced Practice Providers (APPs -  Physician Assistants and Nurse Practitioners) who all work together to provide you with the care you need, when you need it.  We recommend signing up for the patient portal called "MyChart".  Sign up information is provided on this After Visit Summary.  MyChart is used to connect with patients for Virtual Visits (Telemedicine).  Patients are able to view lab/test results, encounter notes, upcoming appointments, etc.  Non-urgent messages can be sent to your provider as well.   To learn more about what you can do with MyChart, go to [email or web address].    Your next appointment:   1 year(s)  Provider:   Eligha Bridegroom, NP         Other Instructions  Your physician wants you to follow-up in: 1 year with Lebron Conners.  You will receive a reminder letter in the mail two months in advance. If you don't receive a letter, please call our office to schedule the follow-up appointment.

## 2023-02-08 ENCOUNTER — Other Ambulatory Visit: Payer: Self-pay | Admitting: Gastroenterology

## 2023-02-17 DIAGNOSIS — H52203 Unspecified astigmatism, bilateral: Secondary | ICD-10-CM | POA: Diagnosis not present

## 2023-02-17 DIAGNOSIS — H35371 Puckering of macula, right eye: Secondary | ICD-10-CM | POA: Diagnosis not present

## 2023-02-17 DIAGNOSIS — H2513 Age-related nuclear cataract, bilateral: Secondary | ICD-10-CM | POA: Diagnosis not present

## 2023-02-17 DIAGNOSIS — H25013 Cortical age-related cataract, bilateral: Secondary | ICD-10-CM | POA: Diagnosis not present

## 2023-03-09 ENCOUNTER — Other Ambulatory Visit: Payer: Self-pay | Admitting: Gastroenterology

## 2023-03-17 DIAGNOSIS — H903 Sensorineural hearing loss, bilateral: Secondary | ICD-10-CM | POA: Diagnosis not present

## 2023-03-17 DIAGNOSIS — H93A9 Pulsatile tinnitus, unspecified ear: Secondary | ICD-10-CM | POA: Diagnosis not present

## 2023-04-17 ENCOUNTER — Other Ambulatory Visit: Payer: Self-pay | Admitting: Family Medicine

## 2023-04-25 ENCOUNTER — Other Ambulatory Visit: Payer: Self-pay | Admitting: Cardiovascular Disease

## 2023-05-07 ENCOUNTER — Telehealth: Payer: Self-pay | Admitting: Cardiovascular Disease

## 2023-05-07 NOTE — Telephone Encounter (Signed)
Spoke to patient who stated she felt like she has been having episodes of A-Fib. She report HR 60-80. She also report episodes of feeling chest heaviness but no other symptoms. BP is 148/76 hr 66. She deny any symptoms at the present moment.

## 2023-05-07 NOTE — Telephone Encounter (Signed)
Patient c/o Palpitations:  High priority if patient c/o lightheadedness, shortness of breath, or chest pain  How long have you had palpitations/irregular HR/ Afib? Are you having the symptoms now? Weeks and yes   Are you currently experiencing lightheadedness, SOB or CP? Chest pain   Do you have a history of afib (atrial fibrillation) or irregular heart rhythm? Yes   Have you checked your BP or HR? (document readings if available): HR-66 BP-131/67  Are you experiencing any other symptoms? Pt states she is in AFIB and would like to speak with a nurse. Please advise

## 2023-05-09 ENCOUNTER — Other Ambulatory Visit: Payer: Self-pay | Admitting: Family Medicine

## 2023-05-10 ENCOUNTER — Ambulatory Visit: Payer: Medicare HMO | Attending: Nurse Practitioner | Admitting: Nurse Practitioner

## 2023-05-10 ENCOUNTER — Encounter: Payer: Self-pay | Admitting: Nurse Practitioner

## 2023-05-10 VITALS — BP 124/84 | HR 58 | Ht 59.75 in | Wt 142.6 lb

## 2023-05-10 DIAGNOSIS — I480 Paroxysmal atrial fibrillation: Secondary | ICD-10-CM

## 2023-05-10 DIAGNOSIS — I10 Essential (primary) hypertension: Secondary | ICD-10-CM

## 2023-05-10 DIAGNOSIS — R55 Syncope and collapse: Secondary | ICD-10-CM

## 2023-05-10 DIAGNOSIS — R0989 Other specified symptoms and signs involving the circulatory and respiratory systems: Secondary | ICD-10-CM

## 2023-05-10 DIAGNOSIS — E785 Hyperlipidemia, unspecified: Secondary | ICD-10-CM

## 2023-05-10 DIAGNOSIS — R931 Abnormal findings on diagnostic imaging of heart and coronary circulation: Secondary | ICD-10-CM | POA: Diagnosis not present

## 2023-05-10 NOTE — Patient Instructions (Signed)
Medication Instructions:  Your physician recommends that you continue on your current medications as directed. Please refer to the Current Medication list given to you today.  *If you need a refill on your cardiac medications before your next appointment, please call your pharmacy*   Lab Work: NONE ordered at this time of appointment    Testing/Procedures: Your physician has requested that you have a carotid duplex. This test is an ultrasound of the carotid arteries in your neck. It looks at blood flow through these arteries that supply the brain with blood. Allow one hour for this exam. There are no restrictions or special instructions.    Follow-Up: At [facility], you and your health needs are our priority.  As part of our continuing mission to provide you with exceptional heart care, we have created designated Provider Care Teams.  These Care Teams include your primary Cardiologist (physician) and Advanced Practice Providers (APPs -  Physician Assistants and Nurse Practitioners) who all work together to provide you with the care you need, when you need it.  We recommend signing up for the patient portal called "MyChart".  Sign up information is provided on this After Visit Summary.  MyChart is used to connect with patients for Virtual Visits (Telemedicine).  Patients are able to view lab/test results, encounter notes, upcoming appointments, etc.  Non-urgent messages can be sent to your provider as well.   To learn more about what you can do with MyChart, go to [email or web address].    Your next appointment:    Keep December 2024 appt  Provider:   Nanetta Batty, MD     Other Instructions Discussed Mayford Knife in office today.

## 2023-05-10 NOTE — Telephone Encounter (Signed)
Noted  

## 2023-05-10 NOTE — Progress Notes (Signed)
Office Visit    Patient Name: SOPHIAH PAYES Date of Encounter: 05/10/2023  Primary Care Provider:  Bradd Canary, MD Primary Cardiologist:  Nanetta Batty, MD  Chief Complaint    82 year old female with a history of paroxysmal atrial fibrillation, elevated coronary calcium score, near syncope s/p ILR in 2019, hypertension, hyperlipidemia and GERD who presents for follow-up related to atrial fibrillation.  Past Medical History    Past Medical History:  Diagnosis Date   Anxiety and depression    Arthritis    hands, knees, etc   Bladder prolapse, female, acquired 09/16/2014   Bowen's disease    Chronic atrial fibrillation (HCC)    Diverticulitis    Diverticulosis    GERD (gastroesophageal reflux disease)    H/O hematuria    for several years, work only revealed kidney stone on right side   Hepatic cyst    Hiatal hernia    History of chicken pox    History of kidney stones    Hyperlipidemia    Hypertension    IBS (irritable bowel syndrome)    Leg cramps, sleep related 04/02/2016   Myalgia 04/25/2017   Osteopenia 06/26/2014   Osteoporosis 06/26/2014   Plantar wart of left foot 10/15/2016   Renal cyst    Right shoulder pain 04/23/2017   SCC (squamous cell carcinoma)    arms   Statin intolerance 09/11/2019   Past Surgical History:  Procedure Laterality Date   CARDIOVERSION N/A 03/27/2022   Procedure: CARDIOVERSION;  Surgeon: Jodelle Red, MD;  Location: [facility] ENDOSCOPY;  Service: Cardiovascular;  Laterality: N/A;   CHOLECYSTECTOMY  06/30/2003   LOOP RECORDER INSERTION N/A 04/15/2018   Procedure: LOOP RECORDER INSERTION;  Surgeon: Thurmon Fair, MD;  Location: [facility] INVASIVE CV LAB;  Service: Cardiovascular;  Laterality: N/A;   SKIN BIOPSY     multiple   SKIN CANCER EXCISION     forehead   TONSILLECTOMY     age 81    Allergies  Allergies  Allergen Reactions   Morphine And Codeine Swelling   Statins Other (See Comments)    Myalgias, weakness  (atorvastatin, RYR, rosuvastatin, pravastatin)   Sulfa Antibiotics Swelling and Rash     Labs/Other Studies Reviewed    The following studies were reviewed today:  Cardiac Studies & Procedures     STRESS TESTS  MYOCARDIAL PERFUSION IMAGING 05/01/2022  Narrative   The study is normal. The study is low risk.   No ST deviation was noted.   Left ventricular function is normal. End diastolic cavity size is normal. End systolic cavity size is normal.  Hyperdynamic LV.  Study favors apical thinning over small apical infarct. Negative stress test.  Similar to 2019 Non D-SPECT study.   ECHOCARDIOGRAM  ECHOCARDIOGRAM COMPLETE 03/30/2022  Narrative ECHOCARDIOGRAM REPORT    Patient Name:   ZATORIA GONDER Date of Exam: 03/30/2022 Medical Rec #:  [id number]        Height:       59.0 in Accession #:    [id number]       Weight:       138.0 lb Date of Birth:  [birth year]       BSA:          1.575 m Patient Age:    80 years         BP:           142/78 mmHg Patient Gender: F  HR:           68 bpm. Exam Location:  [address]  Procedure: 2D Echo, Cardiac Doppler and Color Doppler  Indications:    I10 Hypertension  History:        Patient has prior history of Echocardiogram examinations, most recent 03/02/2018. Risk Factors:Hypertension and Dyslipidemia.  Sonographer:    Sedonia Small Rodgers-Jones RDCS Referring Phys: [id number] JONATHAN J BERRY  IMPRESSIONS   1. Left ventricular ejection fraction, by estimation, is 60 to 65%. The left ventricle has normal function. The left ventricle has no regional wall motion abnormalities. Left ventricular diastolic parameters were normal. 2. Right ventricular systolic function is normal. The right ventricular size is normal. 3. Left atrial size was mildly dilated. 4. The mitral valve is abnormal. Mild mitral valve regurgitation. No evidence of mitral stenosis. 5. The aortic valve is tricuspid. There is mild calcification of the aortic  valve. Aortic valve regurgitation is mild. Aortic valve sclerosis is present, with no evidence of aortic valve stenosis. 6. Aortic dilatation noted. There is mild dilatation of the ascending aorta, measuring 38 mm. 7. The inferior vena cava is normal in size with greater than 50% respiratory variability, suggesting right atrial pressure of 3 mmHg.  FINDINGS Left Ventricle: Left ventricular ejection fraction, by estimation, is 60 to 65%. The left ventricle has normal function. The left ventricle has no regional wall motion abnormalities. The left ventricular internal cavity size was normal in size. There is no left ventricular hypertrophy. Left ventricular diastolic parameters were normal.  Right Ventricle: The right ventricular size is normal. No increase in right ventricular wall thickness. Right ventricular systolic function is normal.  Left Atrium: Left atrial size was mildly dilated.  Right Atrium: Right atrial size was normal in size.  Pericardium: There is no evidence of pericardial effusion.  Mitral Valve: The mitral valve is abnormal. There is mild thickening of the mitral valve leaflet(s). Mild mitral valve regurgitation. No evidence of mitral valve stenosis.  Tricuspid Valve: The tricuspid valve is normal in structure. Tricuspid valve regurgitation is not demonstrated. No evidence of tricuspid stenosis.  Aortic Valve: The aortic valve is tricuspid. There is mild calcification of the aortic valve. Aortic valve regurgitation is mild. Aortic valve sclerosis is present, with no evidence of aortic valve stenosis.  Pulmonic Valve: The pulmonic valve was normal in structure. Pulmonic valve regurgitation is not visualized. No evidence of pulmonic stenosis.  Aorta: Aortic dilatation noted. There is mild dilatation of the ascending aorta, measuring 38 mm.  Venous: The inferior vena cava is normal in size with greater than 50% respiratory variability, suggesting right atrial pressure of 3  mmHg.  IAS/Shunts: No atrial level shunt detected by color flow Doppler.   LEFT VENTRICLE PLAX 2D LVIDd:         4.20 cm   Diastology LVIDs:         2.80 cm   LV e' medial:    9.14 cm/s LV PW:         0.70 cm   LV E/e' medial:  10.3 LV IVS:        0.70 cm   LV e' lateral:   11.50 cm/s LVOT diam:     1.70 cm   LV E/e' lateral: 8.2 LV SV:         54 LV SV Index:   34 LVOT Area:     2.27 cm   RIGHT VENTRICLE  IVC RV Basal diam:  2.90 cm     IVC diam: 1.60 cm RV S prime:     11.65 cm/s TAPSE (M-mode): 2.5 cm  LEFT ATRIUM             Index        RIGHT ATRIUM           Index LA diam:        4.20 cm 2.67 cm/m   RA Area:     10.20 cm LA Vol (A2C):   39.9 ml 25.33 ml/m  RA Volume:   23.00 ml  14.60 ml/m LA Vol (A4C):   27.1 ml 17.20 ml/m LA Biplane Vol: 33.8 ml 21.46 ml/m AORTIC VALVE LVOT Vmax:   108.50 cm/s LVOT Vmean:  69.200 cm/s LVOT VTI:    0.236 m  AORTA Ao Root diam: 3.00 cm Ao Asc diam:  3.80 cm  MITRAL VALVE               TRICUSPID VALVE MV Area (PHT): 3.26 cm    TR Peak grad:   25.6 mmHg MV Decel Time: 233 msec    TR Vmax:        253.00 cm/s MV E velocity: 94.05 cm/s MV A velocity: 82.55 cm/s  SHUNTS MV E/A ratio:  1.14        Systemic VTI:  0.24 m Systemic Diam: 1.70 cm  Charlton Haws MD Electronically signed by Charlton Haws MD Signature Date/Time: 03/30/2022/2:33:14 PM    Final    MONITORS  CARDIAC EVENT MONITOR 02/25/2018  Narrative -Rhythm/sinus bradycardia/sinus tachycardia with occasional PVCs and PACs Short run of nonsustained ventricular tachycardia Several short atrial runs.   CT SCANS  CT CARDIAC SCORING (SELF PAY ONLY) 01/24/2020  Addendum 01/24/2020  8:39 PM ADDENDUM REPORT: 01/24/2020 20:37  CLINICAL DATA:  Risk stratification  EXAM: Coronary Calcium Score  TECHNIQUE: The patient was scanned on a [employer] scanner. Axial non-contrast 3 mm slices were carried out through the heart. The data set was analyzed  on a dedicated work station and scored using the Agatson method.  FINDINGS: Non-cardiac: See separate report from [facility].  Ascending Aorta: Mildly dilated 3.8 cm at the level of the main PA bifurcation.  Pericardium: Normal  Coronary arteries: Normal origin.  Aortic atherosclerosis.  IMPRESSION: Coronary calcium score of 304. This was 76th percentile for age and sex matched control  Mildly dilated aorta to 3.8 cm at the level of the main PA bifurcation  Aortic atherosclerosis   Electronically Signed By: Chrystie Nose M.D. On: 01/24/2020 20:37  Narrative EXAM: OVER-READ INTERPRETATION  CT CHEST  The following report is an over-read performed by radiologist Dr. Trudie Reed of [facility] on 01/24/2020. This over-read does not include interpretation of cardiac or coronary anatomy or pathology. The coronary calcium score interpretation by the cardiologist is attached.  COMPARISON:  None.  FINDINGS: Aortic atherosclerosis. Within the visualized portions of the thorax there are no suspicious appearing pulmonary nodules or masses, there is no acute consolidative airspace disease, no pleural effusions, no pneumothorax and no lymphadenopathy. Visualized portions of the upper abdomen demonstrates a 1 cm low-attenuation lesion in segment 2 of the liver, incompletely characterized on today's non-contrast CT examination, but statistically likely to represent a cyst. There are no aggressive appearing lytic or blastic lesions noted in the visualized portions of the skeleton.  IMPRESSION: 1.  Aortic Atherosclerosis (ICD10-I70.0).  Electronically Signed: By: Trudie Reed M.D. On: 01/24/2020 13:37  Recent Labs: 12/14/2022: ALT 10; BUN 11; Creatinine, Ser 0.98; Hemoglobin 12.4; Platelets 190.0; Potassium 4.0; Sodium 138; TSH 1.78  Recent Lipid Panel    Component Value Date/Time   CHOL 160 12/14/2022 1051   CHOL 146 09/24/2021 0815    TRIG 173.0 (H) 12/14/2022 1051   HDL 50.50 12/14/2022 1051   HDL 53 09/24/2021 0815   CHOLHDL 3 12/14/2022 1051   VLDL 34.6 12/14/2022 1051   LDLCALC 75 12/14/2022 1051   LDLCALC 63 09/24/2021 0815   LDLCALC 84 04/02/2020 1245   LDLDIRECT 137.0 12/25/2019 1331    History of Present Illness    82 year old female with the above past medical history including paroxysmal atrial fibrillation, elevated coronary calcium score, near syncope s/p ILR in 2019, hypertension, hyperlipidemia and GERD.   She has a history of near syncope, s/p ILR in 2019.  She was subsequetly diagnosed with atrial fibrillation and underwent DCCV in September 2023.  CT cardiac scoring in July 2021 showed coronary calcium score of 304 (any 6 percentile).  Stress test in November 2023 was low risk, no evidence of ischemia. She was last seen in the office on 02/03/2023 was doing well from a cardiac standpoint.  She contacted our office on 05/07/2023 with concern for recurrent atrial fibrillation, associated chest heaviness.  She presents today for follow-up accompanied by her daughter.  Since her last visit she has been stable overall from a cardiac standpoint. She does note intermittent "fluttering" in her heart with associated chest heaviness, intermittently elevated BP.  She thinks she has been going in and out of atrial fibrillation.  She denies chest pain, dizziness, dyspnea, edema, PND, orthopnea, weight gain.  Home Medications    Current Outpatient Medications  Medication Sig Dispense Refill   Calcium Carb-Cholecalciferol (CALCIUM 600 + D PO) Take 1 tablet by mouth daily.     carvedilol (COREG) 25 MG tablet TAKE 1 TABLET(25 MG) BY MOUTH TWICE DAILY WITH A MEAL 180 tablet 3   Cholecalciferol (VITAMIN D) 50 MCG (2000 UT) tablet Take 1 tablet (2,000 Units total) by mouth daily.     Co-Enzyme Q10 200 MG CAPS Take 200 mg by mouth daily.     diltiazem (CARDIZEM CD) 240 MG 24 hr capsule TAKE 1 CAPSULE(240 MG) BY MOUTH DAILY  90 capsule 3   ELIQUIS 5 MG TABS tablet TAKE 1 TABLET(5 MG) BY MOUTH TWICE DAILY 180 tablet 1   estradiol (ESTRACE) 0.1 MG/GM vaginal cream Place 1 Applicatorful vaginally 2 (two) times a week.     famotidine (PEPCID) 20 MG tablet TAKE 1 TABLET(20 MG) BY MOUTH TWICE DAILY AS NEEDED FOR HEARTBURN OR INDIGESTION 60 tablet 0   Hypromellose (ARTIFICIAL TEARS OP) Place 1 drop into both eyes as needed (dry eyes).     Probiotic Product (PROBIOTIC DAILY PO) Take 1 capsule by mouth daily.      REPATHA SURECLICK 140 MG/ML SOAJ INJECT 1 DOSE INTO SKIN AS DIRECTED FOR 14 DAYS 2 mL 11   acetaminophen (TYLENOL) 500 MG tablet Take 1,000 mg by mouth 2 (two) times daily as needed for moderate pain or headache. (Patient not taking: Reported on 05/10/2023)     pantoprazole (PROTONIX) 40 MG tablet TAKE 1 TABLET(40 MG) BY MOUTH DAILY (Patient not taking: Reported on 05/10/2023) 30 tablet 0   No current facility-administered medications for this visit.   Facility-Administered Medications Ordered in Other Visits  Medication Dose Route Frequency Provider Last Rate Last Admin   regadenoson (LEXISCAN) injection SOLN 0.4 mg  0.4 mg Intravenous Once Chandrasekhar, Mahesh A, MD       technetium tetrofosmin (TC-MYOVIEW) injection 10.1 millicurie  10.1 millicurie Intravenous Once PRN Chandrasekhar, Mahesh A, MD       technetium tetrofosmin (TC-MYOVIEW) injection 32.7 millicurie  32.7 millicurie Intravenous Once PRN Chandrasekhar, Mahesh A, MD         Review of Systems    She denies chest pain, dyspnea, pnd, orthopnea, n, v, dizziness, syncope, edema, weight gain, or early satiety. All other systems reviewed and are otherwise negative except as noted above.   Physical Exam    VS:  BP 124/84   Pulse (!) 58   Ht 4' 11.75" (1.518 m)   Wt 142 lb 9.6 oz (64.7 kg)   SpO2 93%   BMI 28.08 kg/m   GEN: Well nourished, well developed, in no acute distress. HEENT: normal. Neck: Supple, no JVD, R carotid bruit, no  masses. Cardiac: RRR, no murmurs, rubs, or gallops. No clubbing, cyanosis, edema.  Radials/DP/PT 2+ and equal bilaterally.  Respiratory:  Respirations regular and unlabored, clear to auscultation bilaterally. GI: Soft, nontender, nondistended, BS + x 4. MS: no deformity or atrophy. Skin: warm and dry, no rash. Neuro:  Strength and sensation are intact. Psych: Normal affect.  Accessory Clinical Findings    ECG personally reviewed by me today - EKG Interpretation Date/Time:  Monday May 10 2023 15:20:46 EST Ventricular Rate:  58 PR Interval:  182 QRS Duration:  64 QT Interval:  442 QTC Calculation: 433 R Axis:   0  Text Interpretation: Sinus bradycardia Possible Inferior infarct , age undetermined Cannot rule out Anterior infarct (cited on or before 06-Apr-2022) When compared with ECG of 06-Apr-2022 13:39, Questionable change in initial forces of Anterior leads Confirmed by Bernadene Person ([id number]) on 05/10/2023 3:38:41 PM  - no acute changes.   Lab Results  Component Value Date   WBC 7.8 12/14/2022   HGB 12.4 12/14/2022   HCT 38.5 12/14/2022   MCV 94.4 12/14/2022   PLT 190.0 12/14/2022   Lab Results  Component Value Date   CREATININE 0.98 12/14/2022   BUN 11 12/14/2022   NA 138 12/14/2022   K 4.0 12/14/2022   CL 102 12/14/2022   CO2 26 12/14/2022   Lab Results  Component Value Date   ALT 10 12/14/2022   AST 17 12/14/2022   ALKPHOS 78 12/14/2022   BILITOT 0.6 12/14/2022   Lab Results  Component Value Date   CHOL 160 12/14/2022   HDL 50.50 12/14/2022   LDLCALC 75 12/14/2022   LDLDIRECT 137.0 12/25/2019   TRIG 173.0 (H) 12/14/2022   CHOLHDL 3 12/14/2022    Lab Results  Component Value Date   HGBA1C 5.7 12/14/2022    Assessment & Plan    1. Paroxysmal atrial fibrillation: Recent intermittent "fluttering" in her heart with associated chest heaviness, intermittently elevated BP.  Symptoms are similar to prior episodes of atrial fibrillation. EKG today shows  sinus bradycardia.  We discussed home monitoring with Kardia mobile device.  Reviewed ED precautions.  She notes she will have labs (CBC, BMET, TSH) drawn with her PCP in the coming weeks, will defer any additional lab work today. Continue to monitor HR/rhythm. Continue diltiazem, carvedilol, Eliquis.  2. Elevated coronary artery calcium score: CT cardiac scoring in July 2021 showed coronary calcium score of 304 (any 6 percentile).  Stress test in November 2023 was low risk, no evidence of ischemia. Stable with no anginal symptoms. No indication for ischemic evaluation.  Continue Repatha.  3. History of near syncope: S/p ILR, in place but no longer active.  Denies recurrent syncope or presyncope.   4. Hypertension: BP initially elevated in office today, improved with recheck.  Continue to monitor BP.  For now, continue current antihypertensive regimen.  5. Hyperlipidemia: LDL was 75 in 11/2022.  Follows with Dr. Rennis Golden.  Continue Repatha.  6. Carotid bruit: She is notes that she can hear her pulse in her ears.  She was told that she had a carotid bruit. Carotid ultrasound in 2022 showed no evidence of carotid artery stenosis.  She does have an audible right carotid bruit on exam today.  Will repeat carotid ultrasound for monitoring.   7. Disposition: Follow-up as scheduled  with Dr. Allyson Sabal in 05/2023.      Joylene Grapes, NP 05/10/2023, 7:29 PM

## 2023-05-10 NOTE — Telephone Encounter (Signed)
Spoke to patient who stated she is about the same. She deny worsening of symptoms. Appt scheduled for today with Bernadene Person, NP 3:10 pm.

## 2023-05-19 ENCOUNTER — Ambulatory Visit (HOSPITAL_COMMUNITY)
Admission: RE | Admit: 2023-05-19 | Discharge: 2023-05-19 | Disposition: A | Discharge status: Home / Self Care | Payer: Medicare HMO | Source: Ambulatory Visit | Attending: Cardiovascular Disease | Admitting: Cardiovascular Disease

## 2023-05-19 DIAGNOSIS — R0989 Other specified symptoms and signs involving the circulatory and respiratory systems: Secondary | ICD-10-CM | POA: Insufficient documentation

## 2023-06-07 ENCOUNTER — Ambulatory Visit (INDEPENDENT_AMBULATORY_CARE_PROVIDER_SITE_OTHER): Payer: Medicare HMO | Admitting: Family Medicine

## 2023-06-07 ENCOUNTER — Encounter: Payer: Self-pay | Admitting: Family Medicine

## 2023-06-07 ENCOUNTER — Other Ambulatory Visit (HOSPITAL_BASED_OUTPATIENT_CLINIC_OR_DEPARTMENT_OTHER): Payer: Self-pay

## 2023-06-07 VITALS — BP 134/84 | HR 62 | Temp 97.7°F | Resp 18 | Ht 59.75 in | Wt 139.0 lb

## 2023-06-07 DIAGNOSIS — R1011 Right upper quadrant pain: Secondary | ICD-10-CM | POA: Diagnosis not present

## 2023-06-07 DIAGNOSIS — Z8719 Personal history of other diseases of the digestive system: Secondary | ICD-10-CM

## 2023-06-07 DIAGNOSIS — R3129 Other microscopic hematuria: Secondary | ICD-10-CM | POA: Diagnosis not present

## 2023-06-07 LAB — POC URINALSYSI DIPSTICK (AUTOMATED)
Bilirubin, UA: NEGATIVE
Glucose, UA: NEGATIVE
Ketones, UA: NEGATIVE
Nitrite, UA: NEGATIVE
Protein, UA: NEGATIVE
Spec Grav, UA: <=1.005 — AB (ref 1.010–1.025)
Urobilinogen, UA: 0.2 U/dL
pH, UA: 6.5 (ref 5.0–8.0)

## 2023-06-07 MED ORDER — ONDANSETRON HCL 4 MG PO TABS
4.0000 mg | ORAL_TABLET | Freq: Three times a day (TID) | ORAL | 0 refills | Status: DC | PRN
  Filled 2023-06-07: qty 20, 7d supply, fill #0

## 2023-06-07 MED ORDER — AMOXICILLIN-POT CLAVULANATE 875-125 MG PO TABS
1.0000 | ORAL_TABLET | Freq: Two times a day (BID) | ORAL | 0 refills | Status: DC
  Filled 2023-06-07 (×2): qty 20, 10d supply, fill #0

## 2023-06-07 MED ORDER — AMOXICILLIN-POT CLAVULANATE 875-125 MG PO TABS: 1.0000 | ORAL_TABLET | Freq: Two times a day (BID) | ORAL | 0 refills | Status: DC

## 2023-06-07 NOTE — Progress Notes (Signed)
Established Patient Office Visit  Subjective   Patient ID: Sophia [name], female    DOB: 1941/03/12  Age: 82 y.o. MRN: [id number]  Chief Complaint  Patient presents with   Abdominal Pain    Per patient having some abdominal tenderness on the right side, x1 week.     HPI Discussed the use of AI scribe software for clinical note transcription with the patient, who gave verbal consent to proceed.  History of Present Illness   The patient, with a known history of diverticulosis, presents with a week-long episode of abdominal discomfort, diarrhea, and nausea. She suspects a flare-up of her diverticulitis. The patient reports frequent bouts of this condition, which she manages by avoiding certain foods and maintaining regular bowel movements. However, despite these efforts, she has experienced persistent discomfort in a specific area of her abdomen, which she associates with her diverticulitis.  The current episode began with a day of severe diarrhea, followed by a feeling of general malaise and significant nausea. The patient attempted to manage these symptoms by adopting a liquid diet for three days, after which she felt better and resumed eating, albeit not normally. She has been consuming bland foods, such as soup and soft vegetables, and avoiding anything hard to digest.  Despite these measures, the patient experienced a recurrence of abdominal cramping and diarrhea a week later. She has been on a liquid diet since then. The patient also reports a single instance of noticing blood upon wiping after a bowel movement, which she has never experienced before. She is unsure if this is related to her current symptoms or a separate issue such as a hemorrhoid.  The patient's symptoms have been somewhat different from her usual diverticulitis flare-ups, which has caused her some confusion. She has not had a fever or chills during this episode, unlike previous ones. The patient also mentions a recent  meal that tasted off to her, but no one else who consumed the same food has fallen ill. She is unsure if this could be related to her current symptoms.  The patient has been trying to manage her symptoms at home, including taking over-the-counter medications to maintain fluid balance and bowel regularity. However, she expresses frustration at the persistence of her symptoms and the impact on her ability to eat normally. She is also concerned about the potential need for antibiotics, which she has found difficult to tolerate in the past.      Patient Active Problem List   Diagnosis Date Noted   ETD (Eustachian tube dysfunction), right 12/14/2022   Abnormal CT scan, colon 01/07/2022   Rash 12/05/2021   Colonic thickening 11/25/2021   Impacted cerumen of right ear 11/25/2021   Right upper quadrant abdominal pain 11/10/2021   Urinary frequency 11/04/2021   Elevated coronary artery calcium score 04/09/2021   Nonintractable headache 01/14/2021   Allergic drug reaction 03/26/2020   Vitamin D deficiency 12/28/2019   Complex renal cyst 12/28/2019   Elevated liver function tests 12/22/2019   Persistent atrial fibrillation (HCC) 11/13/2019   Hypercoagulable state due to paroxysmal atrial fibrillation (HCC) 11/13/2019   Encounter for loop recorder check 10/20/2019   Chronic anticoagulation 10/20/2019   Chronic bilateral low back pain without sciatica 09/24/2019   Statin intolerance 09/11/2019   Diarrhea 08/08/2018   Paroxysmal atrial fibrillation (HCC) 08/08/2018   Raynaud phenomenon 08/08/2018   Pulsatile tinnitus 04/26/2018   Hyperglycemia 04/26/2018   Cervicalgia 04/26/2018   Near syncope 04/15/2018   NSVT (nonsustained ventricular tachycardia) (HCC)  04/14/2018   Palpitations 02/25/2018   Bilateral carotid bruits 02/25/2018   Chronic abdominal pain 04/25/2017   Myalgia 04/25/2017   Left shoulder pain 04/23/2017   Leg cramps, sleep related 04/02/2016   Hip pain 05/29/2015    Preventative health care 03/24/2015   Osteoporosis 06/26/2014   Arthritis    GERD (gastroesophageal reflux disease)    Hypertension    Hyperlipidemia    History of kidney stones    H/O hematuria    Diverticulosis    History of chicken pox    Anxiety and depression    SCC (squamous cell carcinoma)    Bowen's disease    Past Medical History:  Diagnosis Date   Anxiety and depression    Arthritis    hands, knees, etc   Bladder prolapse, female, acquired 09/16/2014   Bowen's disease    Chronic atrial fibrillation (HCC)    Diverticulitis    Diverticulosis    GERD (gastroesophageal reflux disease)    H/O hematuria    for several years, work only revealed kidney stone on right side   Hepatic cyst    Hiatal hernia    History of chicken pox    History of kidney stones    Hyperlipidemia    Hypertension    IBS (irritable bowel syndrome)    Leg cramps, sleep related 04/02/2016   Myalgia 04/25/2017   Osteopenia 06/26/2014   Osteoporosis 06/26/2014   Plantar wart of left foot 10/15/2016   Renal cyst    Right shoulder pain 04/23/2017   SCC (squamous cell carcinoma)    arms   Statin intolerance 09/11/2019   Past Surgical History:  Procedure Laterality Date   CARDIOVERSION N/A 03/27/2022   Procedure: CARDIOVERSION;  Surgeon: Jodelle Red, MD;  Location: [facility] ENDOSCOPY;  Service: Cardiovascular;  Laterality: N/A;   CHOLECYSTECTOMY  06/30/2003   LOOP RECORDER INSERTION N/A 04/15/2018   Procedure: LOOP RECORDER INSERTION;  Surgeon: Thurmon Fair, MD;  Location: [facility] INVASIVE CV LAB;  Service: Cardiovascular;  Laterality: N/A;   SKIN BIOPSY     multiple   SKIN CANCER EXCISION     forehead   TONSILLECTOMY     age 52   Social History   Tobacco Use   Smoking status: Never   Smokeless tobacco: Never   Tobacco comments:    Never smoke 03/18/22  Vaping Use   Vaping status: Never Used  Substance Use Topics   Alcohol use: No    Alcohol/week: 0.0 standard drinks of  alcohol   Drug use: No   Social History   Socioeconomic History   Marital status: Single    Spouse name: Not on file   Number of children: 3   Years of education: Not on file   Highest education level: Not on file  Occupational History   Occupation: retired  Tobacco Use   Smoking status: Never   Smokeless tobacco: Never   Tobacco comments:    Never smoke 03/18/22  Vaping Use   Vaping status: Never Used  Substance and Sexual Activity   Alcohol use: No    Alcohol/week: 0.0 standard drinks of alcohol   Drug use: No   Sexual activity: Never    Comment: divorced, widowed, lives with elderly parents, is the caregiver, no dietary restrictions.   Other Topics Concern   Not on file  Social History Narrative   Not on file   Social Determinants of Health   Financial Resource Strain: Low Risk  (09/03/2022)   Overall Financial Resource  Strain (CARDIA)    Difficulty of Paying Living Expenses: Not hard at all  Food Insecurity: No Food Insecurity (09/03/2022)   Hunger Vital Sign    Worried About Running Out of Food in the Last Year: Never true    Ran Out of Food in the Last Year: Never true  Transportation Needs: No Transportation Needs (09/03/2022)   PRAPARE - Administrator, Civil Service (Medical): No    Lack of Transportation (Non-Medical): No  Physical Activity: Sufficiently Active (09/01/2021)   Exercise Vital Sign    Days of Exercise per Week: 7 days    Minutes of Exercise per Session: 60 min  Stress: No Stress Concern Present (09/03/2022)   [employer] of Occupational Health - Occupational Stress Questionnaire    Feeling of Stress : Not at all  Social Connections: Moderately Integrated (09/01/2021)   Social Connection and Isolation Panel [NHANES]    Frequency of Communication with Friends and Family: More than three times a week    Frequency of Social Gatherings with Friends and Family: More than three times a week    Attends Religious Services: More than 4 times  per year    Active Member of [employer] or Organizations: Yes    Attends Banker Meetings: More than 4 times per year    Marital Status: Widowed  Intimate Partner Violence: Not At Risk (09/03/2022)   Humiliation, Afraid, Rape, and Kick questionnaire    Fear of Current or Ex-Partner: No    Emotionally Abused: No    Physically Abused: No    Sexually Abused: No   Family Status  Relation Name Status   Mother  Deceased       93--Hospice   Father  Deceased       43   Brother Michael,69 Alive       39   Brother Ken,66 Alive       15   Brother Dennis,64 Alive       31   MGM  Deceased at age 76   MGF  Deceased at age 21   PGM  Deceased at age 39   PGF  Deceased at age 57   Daughter  Alive       79   Daughter  Alive       52   Son  Manufacturing engineer  (Not Specified)   Mat Education officer, community  Alive   Niece  Alive  No partnership data on file   Family History  Problem Relation Age of Onset   Heart disease Mother        aortic stenosis   Hyperlipidemia Mother    Hypertension Mother    Heart attack Mother    Cancer Mother        BCC, SCC, skin   Hypertension Father    Cancer Father        SCC, BCC, skin   Stroke Brother    Heart disease Brother        arrythmia   Hypertension Brother    Hyperlipidemia Brother    Heart disease Maternal Grandfather        MI   Tuberculosis Paternal Grandmother    Cancer Paternal Grandfather        head and neck cancer, chewed tobacco   Hypertension Daughter    Colon polyps Daughter    Diverticulitis Daughter    Hyperlipidemia Daughter    Thyroid disease Daughter    Asthma Daughter  Kidney Stones Daughter    Heart disease Son    Heart attack Son    Birth defects Maternal Uncle    Colon cancer Maternal Uncle    Crohn's disease Niece    Allergies  Allergen Reactions   Morphine And Codeine Swelling   Statins Other (See Comments)    Myalgias, weakness (atorvastatin, RYR, rosuvastatin, pravastatin)   Sulfa Antibiotics Swelling and  Rash      ROS    Objective:     BP 134/84 (BP Location: Left Arm, Patient Position: Sitting, Cuff Size: Normal)   Pulse 62   Temp 97.7 F (36.5 C) (Oral)   Resp 18   Ht 4' 11.75" (1.518 m)   Wt 139 lb (63 kg)   SpO2 96%   BMI 27.37 kg/m  BP Readings from Last 3 Encounters:  06/07/23 134/84  05/10/23 124/84  02/03/23 118/80   Wt Readings from Last 3 Encounters:  06/07/23 139 lb (63 kg)  05/10/23 142 lb 9.6 oz (64.7 kg)  02/03/23 142 lb (64.4 kg)   SpO2 Readings from Last 3 Encounters:  06/07/23 96%  05/10/23 93%  12/14/22 98%      Physical Exam   Results for orders placed or performed in visit on 06/07/23  POCT Urinalysis Dipstick (Automated)  Result Value Ref Range   Color, UA straw    Clarity, UA clear    Glucose, UA Negative Negative   Bilirubin, UA Negative    Ketones, UA Negative    Spec Grav, UA <=1.005 (A) 1.010 - 1.025   Blood, UA moderate    pH, UA 6.5 5.0 - 8.0   Protein, UA Negative Negative   Urobilinogen, UA 0.2 0.2 or 1.0 E.U./dL   Nitrite, UA Negative    Leukocytes, UA Trace (A) Negative    Last CBC Lab Results  Component Value Date   WBC 7.8 12/14/2022   HGB 12.4 12/14/2022   HCT 38.5 12/14/2022   MCV 94.4 12/14/2022   MCH 31.8 03/18/2022   RDW 13.5 12/14/2022   PLT 190.0 12/14/2022   Last metabolic panel Lab Results  Component Value Date   GLUCOSE 101 (H) 12/14/2022   NA 138 12/14/2022   K 4.0 12/14/2022   CL 102 12/14/2022   CO2 26 12/14/2022   BUN 11 12/14/2022   CREATININE 0.98 12/14/2022   GFR 54.50 (L) 12/14/2022   CALCIUM 9.3 12/14/2022   PROT 7.0 12/14/2022   ALBUMIN 4.4 12/14/2022   BILITOT 0.6 12/14/2022   ALKPHOS 78 12/14/2022   AST 17 12/14/2022   ALT 10 12/14/2022   ANIONGAP 7 03/18/2022   Last lipids Lab Results  Component Value Date   CHOL 160 12/14/2022   HDL 50.50 12/14/2022   LDLCALC 75 12/14/2022   LDLDIRECT 137.0 12/25/2019   TRIG 173.0 (H) 12/14/2022   CHOLHDL 3 12/14/2022   Last  hemoglobin A1c Lab Results  Component Value Date   HGBA1C 5.7 12/14/2022   Last thyroid functions Lab Results  Component Value Date   TSH 1.78 12/14/2022   Last vitamin D Lab Results  Component Value Date   VD25OH 57.03 12/14/2022   Last vitamin B12 and Folate Lab Results  Component Value Date   VITAMINB12 365 01/13/2017   FOLATE >24.0 01/13/2017      The ASCVD Risk score (Arnett DK, et al., 2019) failed to calculate for the following reasons:   The 2019 ASCVD risk score is only valid for ages 74 to 83    Assessment &  Plan:   Problem List Items Addressed This Visit       Unprioritized   Right upper quadrant abdominal pain   Relevant Medications   amoxicillin-clavulanate (AUGMENTIN) 875-125 MG tablet   amoxicillin-clavulanate (AUGMENTIN) 875-125 MG tablet   ondansetron (ZOFRAN) 4 MG tablet   Other Relevant Orders   Comprehensive metabolic panel   CBC with Differential/Platelet   Fecal occult blood, imunochemical   Amylase   Lipase   CT ABDOMEN PELVIS W CONTRAST   POCT Urinalysis Dipstick (Automated) (Completed)   Cdiff NAA+O+P+Stool Culture   Other Visit Diagnoses     History of diverticulitis    -  Primary   Relevant Medications   amoxicillin-clavulanate (AUGMENTIN) 875-125 MG tablet   amoxicillin-clavulanate (AUGMENTIN) 875-125 MG tablet   ondansetron (ZOFRAN) 4 MG tablet   Other Relevant Orders   Comprehensive metabolic panel   CBC with Differential/Platelet   Fecal occult blood, imunochemical   Amylase   Lipase   CT ABDOMEN PELVIS W CONTRAST   POCT Urinalysis Dipstick (Automated) (Completed)   Cdiff NAA+O+P+Stool Culture   Microscopic hematuria       Relevant Orders   Urine Culture     Assessment and Plan    Diverticulitis Recurrent diverticulitis confirmed by colonoscopy presents with diarrhea, nausea, and abdominal cramping, but no fever or chills. Tenderness is localized to the usual area, suggesting a differential of infection or dietary  cause. She has previously been treated with antibiotics and a liquid diet. We discussed starting Augmentin as it is the first-line treatment, acknowledging it may cause diarrhea but is generally less harsh than Flagyl. We emphasized the importance of prompt antibiotic initiation to prevent worsening of her condition and a potential ER visit. We also discussed the possibility of discontinuing antibiotics after further evaluation. We will order a CT scan and blood work, prescribe Augmentin and Zofran for nausea, and refer her for urine analysis.  Diarrhea She has had persistent diarrhea for over a week, which may be related to diverticulitis or a dietary cause. We advised increasing fluid intake to prevent dehydration.  General Health Maintenance We discussed the use of MiraLax for bowel regularity, noting she previously used a natural supplement but discontinued it due to cost. We recommend MiraLax for daily use.  Follow-up We will confirm the timing of the CT scan and follow up after the lab results are available.        No follow-ups on file.    Donato Schultz, DO

## 2023-06-08 ENCOUNTER — Encounter (HOSPITAL_BASED_OUTPATIENT_CLINIC_OR_DEPARTMENT_OTHER): Payer: Self-pay

## 2023-06-08 ENCOUNTER — Other Ambulatory Visit (INDEPENDENT_AMBULATORY_CARE_PROVIDER_SITE_OTHER): Payer: Medicare HMO

## 2023-06-08 ENCOUNTER — Ambulatory Visit (HOSPITAL_BASED_OUTPATIENT_CLINIC_OR_DEPARTMENT_OTHER)
Admission: RE | Admit: 2023-06-08 | Discharge: 2023-06-08 | Disposition: A | Discharge status: Home / Self Care | Payer: Medicare HMO | Source: Ambulatory Visit | Attending: Family Medicine | Admitting: Family Medicine

## 2023-06-08 DIAGNOSIS — R1011 Right upper quadrant pain: Secondary | ICD-10-CM | POA: Insufficient documentation

## 2023-06-08 DIAGNOSIS — K7689 Other specified diseases of liver: Secondary | ICD-10-CM | POA: Diagnosis not present

## 2023-06-08 DIAGNOSIS — Z8719 Personal history of other diseases of the digestive system: Secondary | ICD-10-CM | POA: Insufficient documentation

## 2023-06-08 DIAGNOSIS — K5730 Diverticulosis of large intestine without perforation or abscess without bleeding: Secondary | ICD-10-CM | POA: Diagnosis not present

## 2023-06-08 DIAGNOSIS — N8110 Cystocele, unspecified: Secondary | ICD-10-CM | POA: Diagnosis not present

## 2023-06-08 LAB — COMPREHENSIVE METABOLIC PANEL
ALT: 10 U/L (ref 0–35)
AST: 18 U/L (ref 0–37)
Albumin: 4.4 g/dL (ref 3.5–5.2)
Alkaline Phosphatase: 93 U/L (ref 39–117)
BUN: 7 mg/dL (ref 6–23)
CO2: 27 meq/L (ref 19–32)
Calcium: 9.2 mg/dL (ref 8.4–10.5)
Chloride: 102 meq/L (ref 96–112)
Creatinine, Ser: 0.9 mg/dL (ref 0.40–1.20)
GFR: 60.16 mL/min (ref >60.00)
Glucose, Bld: 101 mg/dL — ABNORMAL HIGH (ref 70–99)
Potassium: 3.9 meq/L (ref 3.5–5.1)
Sodium: 139 meq/L (ref 135–145)
Total Bilirubin: 0.7 mg/dL (ref 0.2–1.2)
Total Protein: 6.9 g/dL (ref 6.0–8.3)

## 2023-06-08 LAB — CBC WITH DIFFERENTIAL/PLATELET
Basophils Absolute: 0.1 10*3/uL (ref 0.0–0.1)
Basophils Relative: 0.6 % (ref 0.0–3.0)
Eosinophils Absolute: 0 10*3/uL (ref 0.0–0.7)
Eosinophils Relative: 0.2 % (ref 0.0–5.0)
HCT: 39.6 % (ref 36.0–46.0)
Hemoglobin: 13.1 g/dL (ref 12.0–15.0)
Lymphocytes Relative: 31.8 % (ref 12.0–46.0)
Lymphs Abs: 3 10*3/uL (ref 0.7–4.0)
MCHC: 33.1 g/dL (ref 30.0–36.0)
MCV: 94.7 fL (ref 78.0–100.0)
Monocytes Absolute: 0.4 10*3/uL (ref 0.1–1.0)
Monocytes Relative: 4.3 % (ref 3.0–12.0)
Neutro Abs: 6 10*3/uL (ref 1.4–7.7)
Neutrophils Relative %: 63.1 % (ref 43.0–77.0)
Platelets: 188 10*3/uL (ref 150.0–400.0)
RBC: 4.18 Mil/uL (ref 3.87–5.11)
RDW: 13.4 % (ref 11.5–15.5)
WBC: 9.6 10*3/uL (ref 4.0–10.5)

## 2023-06-08 LAB — URINE CULTURE
MICRO NUMBER:: 15825822
Result:: NO GROWTH
SPECIMEN QUALITY:: ADEQUATE

## 2023-06-08 LAB — AMYLASE: Amylase: 29 U/L (ref 27–131)

## 2023-06-08 LAB — LIPASE: Lipase: 25 U/L (ref 11.0–59.0)

## 2023-06-08 MED ORDER — IOHEXOL 300 MG/ML  SOLN
100.0000 mL | Freq: Once | INTRAMUSCULAR | Status: AC | PRN
  Administered 2023-06-08: 100 mL via INTRAVENOUS

## 2023-06-10 ENCOUNTER — Other Ambulatory Visit: Payer: Self-pay | Admitting: Family Medicine

## 2023-06-10 DIAGNOSIS — A0472 Enterocolitis due to Clostridium difficile, not specified as recurrent: Secondary | ICD-10-CM

## 2023-06-10 LAB — FECAL OCCULT BLOOD, IMMUNOCHEMICAL: Fecal Occult Bld: NEGATIVE

## 2023-06-10 MED ORDER — FIDAXOMICIN 200 MG PO TABS: 200.0000 mg | ORAL_TABLET | Freq: Two times a day (BID) | ORAL | 0 refills | Status: DC

## 2023-06-12 LAB — CDIFF NAA+O+P+STOOL CULTURE
E coli, Shiga toxin Assay: NEGATIVE
Toxigenic C. Difficile by PCR: POSITIVE — AB

## 2023-06-14 ENCOUNTER — Ambulatory Visit: Payer: Medicare HMO | Admitting: Family Medicine

## 2023-06-14 ENCOUNTER — Ambulatory Visit: Payer: Medicare HMO | Admitting: Physician Assistant

## 2023-06-16 ENCOUNTER — Ambulatory Visit: Payer: Medicare HMO | Admitting: Cardiovascular Disease

## 2023-06-21 ENCOUNTER — Telehealth: Payer: Self-pay

## 2023-06-21 NOTE — Telephone Encounter (Signed)
Copied from CRM [id number]. Topic: Clinical - Medical Advice >> Jun 21, 2023 10:26 AM Deaijah H wrote: Reason for CRM: Patient daughter called in wanting to know if patient is safe to be around public and is on last dose of medicine and would like to also know if patient will need more amoxicillin or is she completely done / please call [phone]

## 2023-06-21 NOTE — Telephone Encounter (Signed)
Daughter notified and also stated pt doesn't have any new or worsening symptoms .

## 2023-07-14 DIAGNOSIS — N8111 Cystocele, midline: Secondary | ICD-10-CM | POA: Diagnosis not present

## 2023-07-14 DIAGNOSIS — Z4689 Encounter for fitting and adjustment of other specified devices: Secondary | ICD-10-CM | POA: Diagnosis not present

## 2023-07-22 ENCOUNTER — Other Ambulatory Visit (HOSPITAL_COMMUNITY): Payer: Self-pay

## 2023-07-22 ENCOUNTER — Other Ambulatory Visit: Payer: Self-pay | Admitting: Cardiovascular Disease

## 2023-07-22 ENCOUNTER — Telehealth: Payer: Self-pay | Admitting: Pharmacy Technician

## 2023-07-22 DIAGNOSIS — I480 Paroxysmal atrial fibrillation: Secondary | ICD-10-CM

## 2023-07-22 NOTE — Telephone Encounter (Signed)
Pharmacy Patient Advocate Encounter   Received notification from Fax that prior authorization for Repatha SureClick 140MG /ML auto-injectors is required/requested.   Insurance verification completed.   The patient is insured through [employer] [facility] [facility] .   Per test claim: PA required; PA submitted to above mentioned insurance via CoverMyMeds Key/confirmation #/EOC [id number] Status is pending

## 2023-07-22 NOTE — Telephone Encounter (Signed)
Prescription refill request for Eliquis received. Indication: PAF Last office visit: 05/10/23  E Monge NP Scr: 0.90 on 06/07/23  Epic Age: 83 Weight: 64.7kg  Based on above findings Eliquis 5mg  twice daily is the appropriate dose.  Refill approved.

## 2023-07-23 ENCOUNTER — Other Ambulatory Visit (HOSPITAL_COMMUNITY): Payer: Self-pay

## 2023-07-23 NOTE — Telephone Encounter (Signed)
Pharmacy Patient Advocate Encounter  Received notification from [facility] that Prior Authorization for Repatha SureClick 140MG /ML auto-injectors  has been APPROVED from 07/22/23 to 01/19/24. Ran test claim, Copay is $12.15. This test claim was processed through [facility]- copay amounts may vary at other pharmacies due to pharmacy/plan contracts, or as the patient moves through the different stages of their insurance plan.   I called the pharmacy but they are closed.   PA #/Case ID/Reference #: [id number]

## 2023-07-23 NOTE — Telephone Encounter (Signed)
Called and spoke to patient. Verified name and DOB. Notified her Repatha PA approved until 01/19/24. Advised her to call her pharmacy to inform them PA approved.

## 2023-08-04 ENCOUNTER — Encounter: Payer: Self-pay | Admitting: Cardiovascular Disease

## 2023-08-04 ENCOUNTER — Ambulatory Visit: Payer: Medicare Other | Attending: Cardiovascular Disease | Admitting: Cardiovascular Disease

## 2023-08-04 VITALS — BP 148/82 | HR 60 | Ht 59.0 in | Wt 140.0 lb

## 2023-08-04 DIAGNOSIS — I480 Paroxysmal atrial fibrillation: Secondary | ICD-10-CM

## 2023-08-04 DIAGNOSIS — R931 Abnormal findings on diagnostic imaging of heart and coronary circulation: Secondary | ICD-10-CM | POA: Diagnosis not present

## 2023-08-04 DIAGNOSIS — E782 Mixed hyperlipidemia: Secondary | ICD-10-CM

## 2023-08-04 DIAGNOSIS — I10 Essential (primary) hypertension: Secondary | ICD-10-CM

## 2023-08-04 NOTE — Assessment & Plan Note (Signed)
History of hyperlipidemia on Repatha with lipid profile performed 12/14/2022 revealing a total cholesterol 160, LDL of 75 and HDL 50.

## 2023-08-04 NOTE — Progress Notes (Signed)
 08/04/2023 Sophia [name]   10/18/1940  [id number]  Primary Physician Sophia Harlene LABOR, [name] Primary Cardiologist: Sophia [name] FACP, [place], [place], FSCAI  HPI:  Sophia [name] is a 83 y.o.    mild to moderately overweight divorced Caucasian female mother of 3 children, grandmother of 5 grandchildren who I last saw in the office 06/16/2022.  She is accompanied by her daughter Sophia [name] today.  She was referred by the ER for evaluation of chest pain.  Her primary care provider is Dr. Harlene [name] .   Risk factors include hyperlipidemia intolerant to statin therapy and family history with mother who had a myocardial infarction at age 45 as well as extensive vascular disease.  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  She is never had a heart attack or stroke.  She has had palpitations evaluated by cardiologist in [place]  7 years ago which time a work-up included stress testing and event monitoring.  She was placed on carvedilol  at that time.  She was seen in the emergency room at [facility] on 02/22/2018 with chest pain and palpitations.  Pain began in the early morning hours and got worse.  She thought it was indigestion.  She did feel increasing palpitations as well as shortness of breath.  She says she can also hear her heartbeat in her left ear.  Her troponins were negative and her EKG showed no acute changes at that time.   When I saw her in the office on 11/08/2019 she was in A. fib with a ventricular response of 106.  I did add Cardizem  CD 120 mg a day.  She was already on Eliquis .  I referred her to the A. fib clinic who saw her on 11/13/2019.  She had converted spontaneously.     She  did well until a Saturday morning back in September when she was awakened with chest pressure and palpitations similar to her prior episodes of PAF.  She is in atrial fibrillation with a controlled ventricular response today in the office.  She says she  still has some vague chest pressure.  She has been on Eliquis  uninterrupted.  I am referring her to the A-fib clinic to arrange outpatient DC cardioversion in the very near future.  I did get a coronary calcium  score on her 01/24/2020 which was 304.  Her last functional study 03/09/2018 was normal.   I referred her to the A-fib clinic who arranged for her to undergo outpatient DC cardioversion by Dr. Shelda [name] 03/27/2022 successfully back to sinus rhythm.  She does feel clinically improved.  Because of occasional chest pain she had a Myoview  stress test performed 05/01/2022 which was entirely normal.  Since I saw her a year ago she has remained stable.  She has had no recurrent episodes of A-fib.  She walks half a mile 3-4 times a week without limitation as well as works in her yard.  She denies chest pain or shortness of breath.  Current Meds  Medication Sig   acetaminophen (TYLENOL) 500 MG tablet Take 1,000 mg by mouth 2 (two) times daily as needed for moderate pain (pain score 4-6) or headache.   Calcium  Carb-Cholecalciferol (CALCIUM  600 + D PO) Take 1 tablet by mouth daily.   carvedilol  (COREG ) 25 MG tablet TAKE 1 TABLET(25 MG) BY MOUTH TWICE DAILY WITH A MEAL   Cholecalciferol (VITAMIN D ) 50 MCG (2000 UT) tablet Take 1 tablet (  2,000 Units total) by mouth daily.   Co-Enzyme Q10 200 MG CAPS Take 200 mg by mouth daily.   diltiazem  (CARDIZEM  CD) 240 MG 24 hr capsule TAKE 1 CAPSULE(240 MG) BY MOUTH DAILY   ELIQUIS  5 MG TABS tablet TAKE 1 TABLET(5 MG) BY MOUTH TWICE DAILY   estradiol  (ESTRACE ) 0.1 MG/GM vaginal cream Place 1 Applicatorful vaginally 2 (two) times a week.   famotidine  (PEPCID ) 20 MG tablet TAKE 1 TABLET(20 MG) BY MOUTH TWICE DAILY AS NEEDED FOR HEARTBURN OR INDIGESTION   Hypromellose (ARTIFICIAL TEARS OP) Place 1 drop into both eyes as needed (dry eyes).   Probiotic Product (PROBIOTIC DAILY PO) Take 1 capsule by mouth daily.    REPATHA  SURECLICK 140 MG/ML SOAJ INJECT 1 DOSE  INTO SKIN AS DIRECTED FOR 14 DAYS     Allergies  Allergen Reactions   Morphine And Codeine Swelling   Statins Other (See Comments)    Myalgias, weakness (atorvastatin, RYR, rosuvastatin , pravastatin)   Sulfa Antibiotics Swelling and Rash    Social History   Socioeconomic History   Marital status: Single    Spouse name: Not on file   Number of children: 3   Years of education: Not on file   Highest education level: Not on file  Occupational History   Occupation: retired  Tobacco Use   Smoking status: Never   Smokeless tobacco: Never   Tobacco comments:    Never smoke 03/18/22  Vaping Use   Vaping status: Never Used  Substance and Sexual Activity   Alcohol use: No    Alcohol/week: 0.0 standard drinks of alcohol   Drug use: No   Sexual activity: Never    Comment: divorced, widowed, lives with elderly parents, is the caregiver, no dietary restrictions.   Other Topics Concern   Not on file  Social History Narrative   Not on file   Social Drivers of Health   Financial Resource Strain: Low Risk  (09/03/2022)   Overall Financial Resource Strain (CARDIA)    Difficulty of Paying Living Expenses: Not hard at all  Food Insecurity: No Food Insecurity (09/03/2022)   Hunger Vital Sign    Worried About Running Out of Food in the Last Year: Never true    Ran Out of Food in the Last Year: Never true  Transportation Needs: No Transportation Needs (09/03/2022)   PRAPARE - Administrator, Civil Service (Medical): No    Lack of Transportation (Non-Medical): No  Physical Activity: Sufficiently Active (09/01/2021)   Exercise Vital Sign    Days of Exercise per Week: 7 days    Minutes of Exercise per Session: 60 min  Stress: No Stress Concern Present (09/03/2022)   [employer] of Occupational Health - Occupational Stress Questionnaire    Feeling of Stress : Not at all  Social Connections: Moderately Integrated (09/01/2021)   Social Connection and Isolation Panel [NHANES]     Frequency of Communication with Friends and Family: More than three times a week    Frequency of Social Gatherings with Friends and Family: More than three times a week    Attends Religious Services: More than 4 times per year    Active Member of [employer] or Organizations: Yes    Attends Banker Meetings: More than 4 times per year    Marital Status: Widowed  Intimate Partner Violence: Not At Risk (09/03/2022)   Humiliation, Afraid, Rape, and Kick questionnaire    Fear of Current or Ex-Partner: No    Emotionally  Abused: No    Physically Abused: No    Sexually Abused: No     Review of Systems: General: negative for chills, fever, night sweats or weight changes.  Cardiovascular: negative for chest pain, dyspnea on exertion, edema, orthopnea, palpitations, paroxysmal nocturnal dyspnea or shortness of breath Dermatological: negative for rash Respiratory: negative for cough or wheezing Urologic: negative for hematuria Abdominal: negative for nausea, vomiting, diarrhea, bright red blood per rectum, melena, or hematemesis Neurologic: negative for visual changes, syncope, or dizziness All other systems reviewed and are otherwise negative except as noted above.    Blood pressure (!) 148/82, pulse 60, height 4' 11 (1.499 m), weight 140 lb (63.5 kg), SpO2 97%.  General appearance: alert and no distress Neck: no adenopathy, no JVD, supple, symmetrical, trachea midline, thyroid  not enlarged, symmetric, no tenderness/mass/nodules, and soft right carotid bruit Lungs: clear to auscultation bilaterally Heart: regular rate and rhythm, S1, S2 normal, no murmur, click, rub or gallop Extremities: extremities normal, atraumatic, no cyanosis or edema Pulses: 2+ and symmetric Skin: Skin color, texture, turgor normal. No rashes or lesions Neurologic: Grossly normal  EKG not performed today      ASSESSMENT AND PLAN:   Hypertension History of essential hypertension her blood pressure measured  today at 148/82.  She is on diltiazem  and carvedilol .  She says in the early to mid afternoon her blood pressure goes up higher but when she takes her evening carvedilol  it is back down to within normal range.  Hyperlipidemia History of hyperlipidemia on Repatha  with lipid profile performed 12/14/2022 revealing a total cholesterol 160, LDL of 75 and HDL 50.  Paroxysmal atrial fibrillation (HCC) History of PAF status post DC cardioversion by Dr. Lonni successfully 03/27/2022.  She is maintained sinus rhythm on Eliquis  oral anticoagulation.  Elevated coronary artery calcium  score Elevated coronary calcium  score of 304 measured 01/24/2020 with a functional study performed 05/01/2022 which was entirely normal.     Sophia DOROTHA Lesches [name] [facility] 08/04/2023 11:13 AM

## 2023-08-04 NOTE — Patient Instructions (Signed)
 Medication Instructions:  Your physician recommends that you continue on your current medications as directed. Please refer to the Current Medication list given to you today.  *If you need a refill on your cardiac medications before your next appointment, please call your pharmacy*   Follow-Up: At [facility], you and your health needs are our priority.  As part of our continuing mission to provide you with exceptional heart care, we have created designated Provider Care Teams.  These Care Teams include your primary Cardiologist (physician) and Advanced Practice Providers (APPs -  Physician Assistants and Nurse Practitioners) who all work together to provide you with the care you need, when you need it.  We recommend signing up for the patient portal called "MyChart".  Sign up information is provided on this After Visit Summary.  MyChart is used to connect with patients for Virtual Visits (Telemedicine).  Patients are able to view lab/test results, encounter notes, upcoming appointments, etc.  Non-urgent messages can be sent to your provider as well.   To learn more about what you can do with MyChart, go to [email or web address].    Your next appointment:   6 month(s)  Provider:   Bernadene Person, NP       Then, Nanetta Batty, MD will plan to see you again in 12 month(s).    Other Instructions

## 2023-08-04 NOTE — Assessment & Plan Note (Signed)
 History of essential hypertension her blood pressure measured today at 148/82.  She is on diltiazem  and carvedilol .  She says in the early to mid afternoon her blood pressure goes up higher but when she takes her evening carvedilol  it is back down to within normal range.

## 2023-08-04 NOTE — Assessment & Plan Note (Signed)
 History of PAF status post DC cardioversion by Dr. Veryl Gottron successfully 03/27/2022.  She is maintained sinus rhythm on Eliquis  oral anticoagulation.

## 2023-08-04 NOTE — Assessment & Plan Note (Signed)
 Elevated coronary calcium  score of 304 measured 01/24/2020 with a functional study performed 05/01/2022 which was entirely normal.

## 2023-08-17 ENCOUNTER — Other Ambulatory Visit: Payer: Self-pay | Admitting: Cardiovascular Disease

## 2023-08-17 DIAGNOSIS — I480 Paroxysmal atrial fibrillation: Secondary | ICD-10-CM

## 2023-08-22 ENCOUNTER — Other Ambulatory Visit: Payer: Self-pay | Admitting: Internal Medicine

## 2023-09-07 ENCOUNTER — Ambulatory Visit (INDEPENDENT_AMBULATORY_CARE_PROVIDER_SITE_OTHER): Payer: Medicare Other

## 2023-09-07 VITALS — Ht 59.3 in | Wt 140.0 lb

## 2023-09-07 DIAGNOSIS — Z0000 Encounter for general adult medical examination without abnormal findings: Secondary | ICD-10-CM

## 2023-09-07 NOTE — Patient Instructions (Addendum)
 Sophia [name] , Thank you for taking time to come for your Medicare Wellness Visit. I appreciate your ongoing commitment to your health goals. Please review the following plan we discussed and let me know if I can assist you in the future.   Referrals/Orders/Follow-Ups/Clinician Recommendations:   This is a list of the screening recommended for you and due dates:  Health Maintenance  Topic Date Due   Pneumonia Vaccine (1 of 2 - PCV) 05/25/1947   Zoster (Shingles) Vaccine (1 of 2) Never done   COVID-19 Vaccine (3 - Pfizer risk series) 04/16/2020   Medicare Annual Wellness Visit  09/06/2024   DTaP/Tdap/Td vaccine (2 - Td or Tdap) 12/11/2026   Flu Shot  Completed   DEXA scan (bone density measurement)  Completed   HPV Vaccine  Aged Out    Advanced directives: (Copy Requested) Please bring a copy of your health care power of attorney and living will to the office to be added to your chart at your convenience. You can mail to [facility] [address] [place], [place] [address] or email to [email or web address]  Next Medicare Annual Wellness Visit scheduled for next year: Yes

## 2023-09-07 NOTE — Progress Notes (Signed)
 Subjective:   Sophia [name] is a 83 y.o. female who presents for Medicare Annual (Subsequent) preventive examination.  Visit Complete: Virtual I connected with  Hillery Jacks on 09/07/23 by a audio enabled telemedicine application and verified that I am speaking with the correct person using two identifiers.  Patient Location: Home  Provider Location: Home Office  I discussed the limitations of evaluation and management by telemedicine. The patient expressed understanding and agreed to proceed.  Vital Signs: Because this visit was a virtual/telehealth visit, some criteria may be missing or patient reported. Any vitals not documented were not able to be obtained and vitals that have been documented are patient reported.   Cardiac Risk Factors include: advanced age (>59men, >35 women);hypertension     Objective:    Today's Vitals   09/07/23 1053  Weight: 140 lb (63.5 kg)  Height: 4' 11.3" (1.506 m)   Body mass index is 27.99 kg/m.     09/07/2023   11:02 AM 09/03/2022    1:40 PM 03/27/2022    9:45 AM 09/01/2021    2:25 PM 09/01/2021    1:47 PM 04/25/2021    9:33 AM 01/29/2021   11:08 AM  Advanced Directives  Does Patient Have a Medical Advance Directive? Yes Yes Yes Yes Yes Yes Yes  Type of Estate agent of [place];Living will Healthcare Power of [place];Living will Healthcare Power of [place];Living will Healthcare Power of [place];Out of facility DNR (pink MOST or yellow form) Healthcare Power of [place];Living will Healthcare Power of [place];Living will Healthcare Power of [place];Living will  Does patient want to make changes to medical advance directive?  No - Patient declined       Copy of Healthcare Power of Attorney in Chart? No - copy requested No - copy requested Yes - validated most recent copy scanned in chart (See row information) No - copy requested No - copy requested Yes - validated most recent copy scanned in chart (See row  information) Yes - validated most recent copy scanned in chart (See row information)    Current Medications (verified) Outpatient Encounter Medications as of 09/07/2023  Medication Sig   acetaminophen (TYLENOL) 500 MG tablet Take 1,000 mg by mouth 2 (two) times daily as needed for moderate pain (pain score 4-6) or headache.   amoxicillin-clavulanate (AUGMENTIN) 875-125 MG tablet Take 1 tablet by mouth 2 (two) times daily. (Patient not taking: Reported on 08/04/2023)   amoxicillin-clavulanate (AUGMENTIN) 875-125 MG tablet Take 1 tablet by mouth 2 (two) times daily. (Patient not taking: Reported on 08/04/2023)   Calcium Carb-Cholecalciferol (CALCIUM 600 + D PO) Take 1 tablet by mouth daily.   carvedilol (COREG) 25 MG tablet TAKE 1 TABLET(25 MG) BY MOUTH TWICE DAILY WITH A MEAL   Cholecalciferol (VITAMIN D) 50 MCG (2000 UT) tablet Take 1 tablet (2,000 Units total) by mouth daily.   Co-Enzyme Q10 200 MG CAPS Take 200 mg by mouth daily.   diltiazem (CARDIZEM CD) 240 MG 24 hr capsule TAKE 1 CAPSULE(240 MG) BY MOUTH DAILY   ELIQUIS 5 MG TABS tablet TAKE 1 TABLET(5 MG) BY MOUTH TWICE DAILY   estradiol (ESTRACE) 0.1 MG/GM vaginal cream Place 1 Applicatorful vaginally 2 (two) times a week.   famotidine (PEPCID) 20 MG tablet TAKE 1 TABLET(20 MG) BY MOUTH TWICE DAILY AS NEEDED FOR HEARTBURN OR INDIGESTION   fidaxomicin (DIFICID) 200 MG TABS tablet Take 1 tablet (200 mg total) by mouth 2 (two) times daily.   Hypromellose (ARTIFICIAL TEARS OP)  Place 1 drop into both eyes as needed (dry eyes).   ondansetron (ZOFRAN) 4 MG tablet Take 1 tablet (4 mg total) by mouth every 8 (eight) hours as needed for nausea or vomiting.   pantoprazole (PROTONIX) 40 MG tablet TAKE 1 TABLET(40 MG) BY MOUTH DAILY (Patient not taking: Reported on 08/04/2023)   Probiotic Product (PROBIOTIC DAILY PO) Take 1 capsule by mouth daily.    REPATHA SURECLICK 140 MG/ML SOAJ INJECT 1 DOSE INTO SKIN AS DIRECTED FOR 14 DAYS   Facility-Administered  Encounter Medications as of 09/07/2023  Medication   regadenoson (LEXISCAN) injection SOLN 0.4 mg   technetium tetrofosmin (TC-MYOVIEW) injection 10.1 millicurie   technetium tetrofosmin (TC-MYOVIEW) injection 32.7 millicurie    Allergies (verified) Morphine and codeine, Statins, and Sulfa antibiotics   History: Past Medical History:  Diagnosis Date   Anxiety and depression    Arthritis    hands, knees, etc   Bladder prolapse, female, acquired 09/16/2014   Bowen's disease    Chronic atrial fibrillation (HCC)    Diverticulitis    Diverticulosis    GERD (gastroesophageal reflux disease)    H/O hematuria    for several years, work only revealed kidney stone on right side   Hepatic cyst    Hiatal hernia    History of chicken pox    History of kidney stones    Hyperlipidemia    Hypertension    IBS (irritable bowel syndrome)    Leg cramps, sleep related 04/02/2016   Myalgia 04/25/2017   Osteopenia 06/26/2014   Osteoporosis 06/26/2014   Plantar wart of left foot 10/15/2016   Renal cyst    Right shoulder pain 04/23/2017   SCC (squamous cell carcinoma)    arms   Statin intolerance 09/11/2019   Past Surgical History:  Procedure Laterality Date   CARDIOVERSION N/A 03/27/2022   Procedure: CARDIOVERSION;  Surgeon: Jodelle Red, MD;  Location: [facility] ENDOSCOPY;  Service: Cardiovascular;  Laterality: N/A;   CHOLECYSTECTOMY  06/30/2003   LOOP RECORDER INSERTION N/A 04/15/2018   Procedure: LOOP RECORDER INSERTION;  Surgeon: Thurmon Fair, MD;  Location: [facility] INVASIVE CV LAB;  Service: Cardiovascular;  Laterality: N/A;   SKIN BIOPSY     multiple   SKIN CANCER EXCISION     forehead   TONSILLECTOMY     age 70   Family History  Problem Relation Age of Onset   Heart disease Mother        aortic stenosis   Hyperlipidemia Mother    Hypertension Mother    Heart attack Mother    Cancer Mother        BCC, SCC, skin   Hypertension Father    Cancer Father        SCC, BCC,  skin   Stroke Brother    Heart disease Brother        arrythmia   Hypertension Brother    Hyperlipidemia Brother    Heart disease Maternal Grandfather        MI   Tuberculosis Paternal Grandmother    Cancer Paternal Grandfather        head and neck cancer, chewed tobacco   Hypertension Daughter    Colon polyps Daughter    Diverticulitis Daughter    Hyperlipidemia Daughter    Thyroid disease Daughter    Asthma Daughter    Kidney Stones Daughter    Heart disease Son    Heart attack Son    Birth defects Maternal Uncle    Colon cancer Maternal Uncle  Crohn's disease Niece    Social History   Socioeconomic History   Marital status: Single    Spouse name: Not on file   Number of children: 3   Years of education: Not on file   Highest education level: Not on file  Occupational History   Occupation: retired  Tobacco Use   Smoking status: Never   Smokeless tobacco: Never   Tobacco comments:    Never smoke 03/18/22  Vaping Use   Vaping status: Never Used  Substance and Sexual Activity   Alcohol use: No    Alcohol/week: 0.0 standard drinks of alcohol   Drug use: No   Sexual activity: Never    Comment: divorced, widowed, lives with elderly parents, is the caregiver, no dietary restrictions.   Other Topics Concern   Not on file  Social History Narrative   Not on file   Social Drivers of Health   Financial Resource Strain: Low Risk  (09/07/2023)   Overall Financial Resource Strain (CARDIA)    Difficulty of Paying Living Expenses: Not hard at all  Food Insecurity: No Food Insecurity (09/07/2023)   Hunger Vital Sign    Worried About Running Out of Food in the Last Year: Never true    Ran Out of Food in the Last Year: Never true  Transportation Needs: No Transportation Needs (09/07/2023)   PRAPARE - Administrator, Civil Service (Medical): No    Lack of Transportation (Non-Medical): No  Physical Activity: Insufficiently Active (09/07/2023)   Exercise Vital  Sign    Days of Exercise per Week: 3 days    Minutes of Exercise per Session: 20 min  Stress: No Stress Concern Present (09/07/2023)   [employer] of Occupational Health - Occupational Stress Questionnaire    Feeling of Stress : Not at all  Social Connections: Moderately Integrated (09/07/2023)   Social Connection and Isolation Panel [NHANES]    Frequency of Communication with Friends and Family: More than three times a week    Frequency of Social Gatherings with Friends and Family: More than three times a week    Attends Religious Services: More than 4 times per year    Active Member of [employer] or Organizations: Yes    Attends Engineer, structural: More than 4 times per year    Marital Status: Divorced    Tobacco Counseling Counseling given: Not Answered Tobacco comments: Never smoke 03/18/22   Clinical Intake:  Pre-visit preparation completed: Yes  Pain : No/denies pain     BMI - recorded: 27.99 Nutritional Status: BMI 25 -29 Overweight Nutritional Risks: None Diabetes: No  How often do you need to have someone help you when you read instructions, pamphlets, or other written materials from your doctor or pharmacy?: 1 - Never  Interpreter Needed?: No  Information entered by :: Theresa Mulligan LPN   Activities of Daily Living    09/07/2023   10:59 AM  In your present state of health, do you have any difficulty performing the following activities:  Hearing? 0  Vision? 0  Difficulty concentrating or making decisions? 0  Walking or climbing stairs? 0  Dressing or bathing? 0  Doing errands, shopping? 0  Preparing Food and eating ? N  Using the Toilet? N  In the past six months, have you accidently leaked urine? Y  Comment Wears pads. Followed by GYN  Do you have problems with loss of bowel control? N  Managing your Medications? N  Managing your Finances? N  Housekeeping or managing your Housekeeping? N    Patient Care Team: Bradd Canary, MD as  PCP - General (Family Medicine) Runell Gess, MD as PCP - Cardiology (Cardiology) Shea Evans, MD as Consulting Physician (Obstetrics and Gynecology) Venancio Poisson, MD as Consulting Physician (Dermatology) Antony Contras, MD as Consulting Physician (Ophthalmology)  Indicate any recent Medical Services you may have received from other than [facility] providers in the past year (date may be approximate).     Assessment:   This is a routine wellness examination for [place].  Hearing/Vision screen Hearing Screening - Comments:: Denies hearing difficulties   Vision Screening - Comments:: Wears rx glasses - up to date with routine eye exams with  Dr Randon Goldsmith   Goals Addressed               This Visit's Progress     Increase physical activity (pt-stated)         Depression Screen    09/07/2023   10:59 AM 12/14/2022   10:01 AM 09/03/2022    1:44 PM 06/01/2022   10:43 AM 11/17/2021   10:56 AM 09/01/2021    1:49 PM 01/14/2021   11:51 AM  PHQ 2/9 Scores  PHQ - 2 Score 0 0 0 0 0 0 0  PHQ- 9 Score  0   0      Fall Risk    09/07/2023   11:01 AM 12/14/2022   10:01 AM 09/03/2022    1:40 PM 06/01/2022   10:43 AM 11/17/2021   12:38 PM  Fall Risk   Falls in the past year? 0 0 0 0 0  Number falls in past yr: 0 0 0 0 0  Injury with Fall? 0 0 0 0 0  Risk for fall due to : No Fall Risks  No Fall Risks    Follow up Falls prevention discussed;Falls evaluation completed Falls evaluation completed Falls evaluation completed Falls evaluation completed     MEDICARE RISK AT HOME: Medicare Risk at Home Any stairs in or around the home?: Yes If so, are there any without handrails?: No Home free of loose throw rugs in walkways, pet beds, electrical cords, etc?: Yes Adequate lighting in your home to reduce risk of falls?: Yes Life alert?: No Use of a cane, walker or w/c?: No Grab bars in the bathroom?: No Shower chair or bench in shower?: No Elevated toilet seat or a handicapped toilet?: No  TIMED  UP AND GO:  Was the test performed?  No    Cognitive Function:    12/15/2016   10:12 AM 06/13/2015   11:34 AM  MMSE - Mini Mental State Exam  Orientation to time 5 5  Orientation to Place 5 5  Registration 3 3  Attention/ Calculation 5 5  Recall 3 3  Language- name 2 objects 2 2  Language- repeat 1 1  Language- follow 3 step command 3 3  Language- read & follow direction 1 1  Write a sentence 1 1  Copy design 1 1  Total score 30 30        09/07/2023   11:03 AM 09/03/2022    1:57 PM  6CIT Screen  What Year? 0 points 0 points  What month? 0 points 0 points  What time? 0 points 0 points  Count back from 20 0 points 0 points  Months in reverse 0 points 0 points  Repeat phrase 0 points 0 points  Total Score 0 points 0 points  Immunizations Immunization History  Administered Date(s) Administered   Fluad Quad(high Dose 65+) 04/12/2019, 06/01/2022   Influenza, High Dose Seasonal PF 03/26/2017, 04/22/2018   Influenza,inj,Quad PF,6+ Mos 03/19/2015   Influenza-Unspecified 04/29/2014, 03/30/2023   PFIZER(Purple Top)SARS-COV-2 Vaccination 03/03/2020, 03/19/2020   Pneumococcal-Unspecified 01/28/2023   Tdap 12/10/2016    TDAP status: Up to date  Flu Vaccine status: Up to date  Pneumococcal vaccine status: Up to date  Covid-19 vaccine status: Declined, Education has been provided regarding the importance of this vaccine but patient still declined. Advised may receive this vaccine at local pharmacy or Health Dept.or vaccine clinic. Aware to provide a copy of the vaccination record if obtained from local pharmacy or Health Dept. Verbalized acceptance and understanding.  Qualifies for Shingles Vaccine? Yes   Zostavax completed No   Shingrix Completed?: No.    Education has been provided regarding the importance of this vaccine. Patient has been advised to call insurance company to determine out of pocket expense if they have not yet received this vaccine. Advised may also  receive vaccine at local pharmacy or Health Dept. Verbalized acceptance and understanding.  Screening Tests Health Maintenance  Topic Date Due   Pneumonia Vaccine 63+ Years old (1 of 2 - PCV) 05/25/1947   Zoster Vaccines- Shingrix (1 of 2) Never done   COVID-19 Vaccine (3 - Pfizer risk series) 04/16/2020   Medicare Annual Wellness (AWV)  09/06/2024   DTaP/Tdap/Td (2 - Td or Tdap) 12/11/2026   INFLUENZA VACCINE  Completed   DEXA SCAN  Completed   HPV VACCINES  Aged Out    Health Maintenance  Health Maintenance Due  Topic Date Due   Pneumonia Vaccine 77+ Years old (1 of 2 - PCV) 05/25/1947   Zoster Vaccines- Shingrix (1 of 2) Never done   COVID-19 Vaccine (3 - Pfizer risk series) 04/16/2020        Bone Density status: Completed 12/28/22. Results reflect: Bone density results: OSTEOPENIA. Repeat every   years.    Additional Screening:    Vision Screening: Recommended annual ophthalmology exams for early detection of glaucoma and other disorders of the eye. Is the patient up to date with their annual eye exam?  Yes  Who is the provider or what is the name of the office in which the patient attends annual eye exams? Dr Randon Goldsmith If pt is not established with a provider, would they like to be referred to a provider to establish care? No .   Dental Screening: Recommended annual dental exams for proper oral hygiene   Community Resource Referral / Chronic Care Management:  CRR required this visit?  No   CCM required this visit?  No     Plan:     I have personally reviewed and noted the following in the patient's chart:   Medical and social history Use of alcohol, tobacco or illicit drugs  Current medications and supplements including opioid prescriptions. Patient is currently taking opioid prescriptions. Information provided to patient regarding non-opioid alternatives. Patient advised to discuss non-opioid treatment plan with their provider. Functional ability and  status Nutritional status Physical activity Advanced directives List of other physicians Hospitalizations, surgeries, and ER visits in previous 12 months Vitals Screenings to include cognitive, depression, and falls Referrals and appointments  In addition, I have reviewed and discussed with patient certain preventive protocols, quality metrics, and best practice recommendations. A written personalized care plan for preventive services as well as general preventive health recommendations were provided to patient.  Tillie Rung, LPN   [id number]/36/[id number]   After Visit Summary: (MyChart) Due to this being a telephonic visit, the after visit summary with patients personalized plan was offered to patient via MyChart   Nurse Notes: None

## 2023-09-09 DIAGNOSIS — L570 Actinic keratosis: Secondary | ICD-10-CM | POA: Diagnosis not present

## 2023-10-03 ENCOUNTER — Other Ambulatory Visit: Payer: Self-pay | Admitting: Family Medicine

## 2023-10-06 ENCOUNTER — Encounter: Payer: Self-pay | Admitting: Family Medicine

## 2023-10-06 ENCOUNTER — Ambulatory Visit: Payer: Self-pay

## 2023-10-06 ENCOUNTER — Ambulatory Visit (INDEPENDENT_AMBULATORY_CARE_PROVIDER_SITE_OTHER): Admitting: Family Medicine

## 2023-10-06 VITALS — BP 145/75 | HR 70 | Ht 59.0 in | Wt 139.0 lb

## 2023-10-06 DIAGNOSIS — R197 Diarrhea, unspecified: Secondary | ICD-10-CM

## 2023-10-06 NOTE — Telephone Encounter (Signed)
 Chief Complaint: Diarrhea Symptoms: abd pain Frequency: Intermittent, 3rd episode in 8 weeks Pertinent Negatives: Patient denies fever, N/V Disposition: [] ED /[] Urgent Care (no appt availability in office) / [x] Appointment(In office/virtual)/ []  [facility]/ [] Home Care/ [] Refused Recommended Disposition /[] [facility]/ []  Follow-up with PCP Additional Notes: Pt reports she has noticed over the last 8 weeks she has had 3 "episodes" where she is experiencing abd pain and diarrhea. Pt notes it begins as loose stool and becomes watery until she is "emptied out". Pt denies fever, N/V. Pt reports history of C-diff. OV scheduled. This RN educated pt on home care, new-worsening symptoms, when to call back/seek emergent care. Pt verbalized understanding and agrees to plan.    Copied from CRM [id number]. Topic: Clinical - Red Word Triage >> Oct 06, 2023  9:39 AM [place] J wrote: [facility] that prompted transfer to Nurse Triage: Diarrhea, abdomen discomfort. Reason for Disposition  [1] Mild diarrhea (e.g., 1-3 or more stools than normal in past 24 hours) without known cause AND [2] present >  7 days  Answer Assessment - Initial Assessment Questions 1. DIARRHEA SEVERITY: "How bad is the diarrhea?" "How many more stools have you had in the past 24 hours than normal?"    - NO DIARRHEA (SCALE 0)   - MILD (SCALE 1-3): Few loose or mushy BMs; increase of 1-3 stools over normal daily number of stools; mild increase in ostomy output.   -  MODERATE (SCALE 4-7): Increase of 4-6 stools daily over normal; moderate increase in ostomy output.   -  SEVERE (SCALE 8-10; OR "WORST POSSIBLE"): Increase of 7 or more stools daily over normal; moderate increase in ostomy output; incontinence.     About 6 2. ONSET: "When did the diarrhea begin?"      Intermittent, yesterday, 3rd day of "episodes" in 8 weeks 3. BM CONSISTENCY: "How loose or watery is the diarrhea?"      Loose to watery 4. VOMITING: "Are you  also vomiting?" If Yes, ask: "How many times in the past 24 hours?"      None 5. ABDOMEN PAIN: "Are you having any abdomen pain?" If Yes, ask: "What does it feel like?" (e.g., crampy, dull, intermittent, constant)      Intermittent cramping with diarrhea 6. ABDOMEN PAIN SEVERITY: If present, ask: "How bad is the pain?"  (e.g., Scale 1-10; mild, moderate, or severe)   - MILD (1-3): doesn't interfere with normal activities, abdomen soft and not tender to touch    - MODERATE (4-7): interferes with normal activities or awakens from sleep, abdomen tender to touch    - SEVERE (8-10): excruciating pain, doubled over, unable to do any normal activities       "Made me think of birth" cramping that comes with diarrhea and resolves when she has had a BM 8. HYDRATION: "Any signs of dehydration?" (e.g., dry mouth [not just dry lips], too weak to stand, dizziness, new weight loss) "When did you last urinate?"     None 10. ANTIBIOTIC USE: "Are you taking antibiotics now or have you taken antibiotics in the past 2 months?"       None 11. OTHER SYMPTOMS: "Do you have any other symptoms?" (e.g., fever, blood in stool)       None  Protocols used: Diarrhea-A-AH

## 2023-10-06 NOTE — Progress Notes (Signed)
   Acute Office Visit  Subjective:     Patient ID: Sophia [name], female    DOB: Jan 27, 1941, 83 y.o.   MRN: [id number]  Chief Complaint  Patient presents with   Diarrhea    Patient presents with a 2 week history of diarrhea that occurs every other day. Has a PMH of IBS, diverticulitis, and C-Diff 4 months ago. With each episode the patient has severe cramping and abdominal tenderness afterward. Her most recent episode was yesterday morning after being awakened by the severe cramping and having diarrhea episodes every 15 minutes. On the days she does not have diarrhea she experiences some loose stools but not diarrhea. She has experienced bloating, chills and occasional body aches prior to and after the diarrhea episodes. Today she has no symptoms but is worried she may have C-Diff again. She denies blood in stool but states it does contain mucous. She denies fever, headache, or bladder changes. She has been staying hydrated with electrolytes and water.   Diarrhea  This is a recurrent problem. The current episode started 1 to 4 weeks ago. The problem occurs 5 to 10 times per day. The problem has been waxing and waning. The stool consistency is described as Mucous and watery. The patient states that diarrhea awakens her from sleep. Associated symptoms include abdominal pain, bloating, chills and myalgias. Pertinent negatives include no fever, headaches or vomiting. Nothing aggravates the symptoms. There are no known risk factors. She has tried change of diet, increased fluids and electrolyte solution for the symptoms. The treatment provided mild relief. Her past medical history is significant for irritable bowel syndrome. diverticulitis   Patient is in today for diarrhea.  Review of Systems  Constitutional:  Positive for chills. Negative for fever.  Gastrointestinal:  Positive for abdominal pain, bloating and diarrhea. Negative for blood in stool, constipation, heartburn, melena, nausea and  vomiting.  Genitourinary: Negative.   Musculoskeletal:  Positive for myalgias.  Neurological:  Negative for headaches.        Objective:    BP (!) 145/75   Pulse 70   Ht 4\' 11"  (1.499 m)   Wt 139 lb (63 kg)   SpO2 98%   BMI 28.07 kg/m    Physical Exam Constitutional:      Appearance: Normal appearance.  Cardiovascular:     Rate and Rhythm: Normal rate.  Pulmonary:     Effort: Pulmonary effort is normal.     Breath sounds: Normal breath sounds.  Abdominal:     General: Abdomen is flat. Bowel sounds are normal.  Skin:    General: Skin is dry.  Neurological:     Mental Status: She is alert.  Psychiatric:        Mood and Affect: Mood normal.     No results found for any visits on 10/06/23.      Assessment & Plan:   Problem List Items Addressed This Visit       Other   Diarrhea - Primary   Relevant Orders   Clostridium Difficile by PCR   CBC with Differential/Platelet   Comprehensive metabolic panel with GFR   Stool culture    No orders of the defined types were placed in this encounter.   Return if symptoms worsen or fail to improve.  Dyke Brackett, RN

## 2023-10-06 NOTE — Progress Notes (Signed)
 Acute Office Visit  Subjective:     Patient ID: Sophia [name], female    DOB: 1940/07/11, 83 y.o.   MRN: [id number]  Chief Complaint  Patient presents with   Diarrhea     Patient is in today for diarrhea.  Discussed the use of AI scribe software for clinical note transcription with the patient, who gave verbal consent to proceed.  History of Present Illness Sophia [name] "Sophia [name]" is an 82 year old female with a history of C. difficile infection who presents with diarrhea and cramping.  She has been experiencing diarrhea and cramping for the past couple of weeks. The diarrhea begins as loose stools and progresses to a liquid consistency, described as 'gold' and 'vial-like'. There is no blood in the stool, and the color is usually brown. Severe cramps occur when she needs to use the bathroom, intense enough to wake her at night. Episodes typically start in the morning, occurring every fifteen minutes until her bowels are emptied for the day. She had a normal bowel movement today, which was slightly loose, but she was able to eat breakfast and lunch without issues. No fever, but she reports chills and body aches. No nausea or vomiting. She stays hydrated and urinates normally.  Her past medical history includes a C. difficile infection in December, attributed to recent antibiotic use for diverticulitis. She has been taking probiotics daily for years due to a history of gastrointestinal issues, including IBS-like symptoms present since birth.   She lives in a mother-in-law apartment with her own bathroom but shares a washing machine and dryer.       All review of systems negative except what is listed in the HPI      Objective:    BP (!) 145/75   Pulse 70   Ht 4\' 11"  (1.499 m)   Wt 139 lb (63 kg)   SpO2 98%   BMI 28.07 kg/m    Physical Exam Vitals reviewed.  Constitutional:      Appearance: Normal appearance.  Cardiovascular:     Rate and Rhythm: Normal rate.   Pulmonary:     Effort: Pulmonary effort is normal.  Abdominal:     General: Bowel sounds are normal. There is no distension.     Palpations: There is no mass.     Tenderness: There is no abdominal tenderness. There is no guarding or rebound.  Skin:    General: Skin is warm and dry.  Neurological:     Mental Status: She is alert and oriented to person, place, and time.  Psychiatric:        Mood and Affect: Mood normal.        Behavior: Behavior normal.        Judgment: Judgment normal.        No results found for any visits on 10/06/23.      Assessment & Plan:   Problem List Items Addressed This Visit       Active Problems   Diarrhea - Primary   Relevant Orders   Clostridium Difficile by PCR   CBC with Differential/Platelet   Comprehensive metabolic panel with GFR   Stool culture    Assessment & Plan Diarrhea Recurrent episodes with severe cramping and loose stools, likely related to recent antibiotic use. Possible C. difficile infection. - Order stool testing. - CBC/CMP today - Advise hydration and monitor for blood in stool, which requires hospital visit. - Educated on hygiene to prevent infection spread, including bleach  cleaning and handwashing. - Continue probiotics      No orders of the defined types were placed in this encounter.   Return if symptoms worsen or fail to improve.  Sophia Dana, NP

## 2023-10-07 ENCOUNTER — Other Ambulatory Visit

## 2023-10-07 ENCOUNTER — Encounter: Payer: Self-pay | Admitting: Family Medicine

## 2023-10-07 DIAGNOSIS — R197 Diarrhea, unspecified: Secondary | ICD-10-CM | POA: Diagnosis not present

## 2023-10-07 LAB — CBC WITH DIFFERENTIAL/PLATELET
Basophils Absolute: 0.1 10*3/uL (ref 0.0–0.1)
Basophils Relative: 0.8 % (ref 0.0–3.0)
Eosinophils Absolute: 0 10*3/uL (ref 0.0–0.7)
Eosinophils Relative: 0.6 % (ref 0.0–5.0)
HCT: 38.2 % (ref 36.0–46.0)
Hemoglobin: 12.7 g/dL (ref 12.0–15.0)
Lymphocytes Relative: 33.9 % (ref 12.0–46.0)
Lymphs Abs: 2.7 10*3/uL (ref 0.7–4.0)
MCHC: 33.1 g/dL (ref 30.0–36.0)
MCV: 94.9 fl (ref 78.0–100.0)
Monocytes Absolute: 0.6 10*3/uL (ref 0.1–1.0)
Monocytes Relative: 7.1 % (ref 3.0–12.0)
Neutro Abs: 4.5 10*3/uL (ref 1.4–7.7)
Neutrophils Relative %: 57.6 % (ref 43.0–77.0)
Platelets: 183 10*3/uL (ref 150.0–400.0)
RBC: 4.03 Mil/uL (ref 3.87–5.11)
RDW: 13.9 % (ref 11.5–15.5)
WBC: 7.8 10*3/uL (ref 4.0–10.5)

## 2023-10-07 LAB — COMPREHENSIVE METABOLIC PANEL WITH GFR
ALT: 9 U/L (ref 0–35)
AST: 16 U/L (ref 0–37)
Albumin: 4.4 g/dL (ref 3.5–5.2)
Alkaline Phosphatase: 83 U/L (ref 39–117)
BUN: 10 mg/dL (ref 6–23)
CO2: 28 meq/L (ref 19–32)
Calcium: 9.3 mg/dL (ref 8.4–10.5)
Chloride: 103 meq/L (ref 96–112)
Creatinine, Ser: 0.84 mg/dL (ref 0.40–1.20)
GFR: 65.2 mL/min (ref >60.00)
Glucose, Bld: 95 mg/dL (ref 70–99)
Potassium: 3.7 meq/L (ref 3.5–5.1)
Sodium: 139 meq/L (ref 135–145)
Total Bilirubin: 0.4 mg/dL (ref 0.2–1.2)
Total Protein: 6.7 g/dL (ref 6.0–8.3)

## 2023-10-07 NOTE — Progress Notes (Signed)
 Specimen drop off

## 2023-10-09 LAB — CLOSTRIDIUM DIFFICILE BY PCR: Toxigenic C. Difficile by PCR: NEGATIVE

## 2023-10-12 LAB — STOOL CULTURE
E coli, Shiga toxin Assay: NEGATIVE
Stool Culture result 1 (CMPCXR): NEGATIVE

## 2023-10-13 ENCOUNTER — Encounter: Payer: Self-pay | Admitting: Family Medicine

## 2023-12-30 ENCOUNTER — Other Ambulatory Visit (HOSPITAL_BASED_OUTPATIENT_CLINIC_OR_DEPARTMENT_OTHER): Payer: Self-pay

## 2023-12-30 ENCOUNTER — Ambulatory Visit: Admitting: Family Medicine

## 2023-12-30 ENCOUNTER — Encounter: Payer: Self-pay | Admitting: Family Medicine

## 2023-12-30 VITALS — BP 136/64 | HR 64 | Temp 98.2°F | Ht 59.0 in | Wt 142.0 lb

## 2023-12-30 DIAGNOSIS — S90861A Insect bite (nonvenomous), right foot, initial encounter: Secondary | ICD-10-CM

## 2023-12-30 DIAGNOSIS — W57XXXA Bitten or stung by nonvenomous insect and other nonvenomous arthropods, initial encounter: Secondary | ICD-10-CM

## 2023-12-30 MED ORDER — DOXYCYCLINE HYCLATE 100 MG PO TABS
100.0000 mg | ORAL_TABLET | Freq: Two times a day (BID) | ORAL | 0 refills | Status: AC
  Filled 2023-12-30: qty 10, 5d supply, fill #0

## 2023-12-30 NOTE — Progress Notes (Signed)
 Acute Office Visit  Subjective:     Patient ID: Sophia [name], female    DOB: [birth year], 83 y.o.   MRN: [id number]  Chief Complaint  Patient presents with   Insect Bite    HPI Patient is in today for bug bite.  Discussed the use of AI scribe software for clinical note transcription with the patient, who gave verbal consent to proceed.  History of Present Illness Sophia [name] is an 83 year old female who presents with a spreading redness from a bite on her leg.  Two and a half weeks ago, she was outside taking pictures when she received two bites to her right foot. One of the bites resolved without issue, but the other developed a yellow center and has recently started to grow in size and redness. The redness is spreading/ yellow center is gone; it does not itch anymore and is not hot or swollen. She has been treating it with antibiotic ointment and cleaning it thoroughly.  She is concerned about the spreading redness, noting that it is unusual for it to start growing after two and a half weeks. She does not know what bit her, but suspects it might have been a spider. There has been no drainage from the bite.      ROS All review of systems negative except what is listed in the HPI      Objective:    BP 136/64   Pulse 64   Temp 98.2 F (36.8 C) (Oral)   Ht 4' 11 (1.499 m)   Wt 142 lb (64.4 kg)   SpO2 96%   BMI 28.68 kg/m    Physical Exam Vitals reviewed.  Constitutional:      General: She is not in acute distress.    Appearance: Normal appearance. She is not ill-appearing.  Pulmonary:     Effort: No respiratory distress.  Skin:    General: Skin is warm and dry.     Comments: Right dorsal foot with approx 3-4cm area of erythema with some streaking; no significant heat or edema  Neurological:     Mental Status: She is alert and oriented to person, place, and time.  Psychiatric:        Mood and Affect: Mood normal.        Behavior: Behavior  normal.        Thought Content: Thought content normal.        Judgment: Judgment normal.     No results found for any visits on 12/30/23.      Assessment & Plan:   Problem List Items Addressed This Visit   None Visit Diagnoses       Insect bite of right foot, initial encounter    -  Primary   Relevant Medications   doxycycline  (VIBRA -TABS) 100 MG tablet       Assessment & Plan Insect bite with possible secondary infection Leg bite with spreading erythema.  - Prescribe doxycycline  for 5 days. - Advise taking doxycycline  with food and water. - Instruct to take probiotics at least two hours apart from the antibiotic. - Recommend warm compresses to the affected area twice daily. - Monitor for signs of worsening infection, such as increased erythema, streaking, or warmth.   Meds ordered this encounter  Medications   doxycycline  (VIBRA -TABS) 100 MG tablet    Sig: Take 1 tablet (100 mg total) by mouth 2 (two) times daily for 5 days.    Dispense:  10 tablet  Refill:  0    Supervising Provider:   DOMENICA BLACKBIRD A [[id number]]    Return if symptoms worsen or fail to improve.  Waddell KATHEE Mon, NP

## 2024-01-12 DIAGNOSIS — N8111 Cystocele, midline: Secondary | ICD-10-CM | POA: Diagnosis not present

## 2024-01-12 DIAGNOSIS — Z4689 Encounter for fitting and adjustment of other specified devices: Secondary | ICD-10-CM | POA: Diagnosis not present

## 2024-01-21 ENCOUNTER — Other Ambulatory Visit: Payer: Self-pay | Admitting: Cardiovascular Disease

## 2024-01-21 DIAGNOSIS — I480 Paroxysmal atrial fibrillation: Secondary | ICD-10-CM

## 2024-01-21 NOTE — Telephone Encounter (Signed)
 Prescription refill request for Eliquis  received. Indication:afib Last office visit:2/25 Scr:0.84  4/25 Age: 83 Weight:64.4  kg  Prescription refilled

## 2024-01-27 ENCOUNTER — Other Ambulatory Visit: Payer: Self-pay | Admitting: Cardiovascular Disease

## 2024-01-31 DIAGNOSIS — D1801 Hemangioma of skin and subcutaneous tissue: Secondary | ICD-10-CM | POA: Diagnosis not present

## 2024-01-31 DIAGNOSIS — L738 Other specified follicular disorders: Secondary | ICD-10-CM | POA: Diagnosis not present

## 2024-01-31 DIAGNOSIS — L718 Other rosacea: Secondary | ICD-10-CM | POA: Diagnosis not present

## 2024-01-31 DIAGNOSIS — D225 Melanocytic nevi of trunk: Secondary | ICD-10-CM | POA: Diagnosis not present

## 2024-01-31 DIAGNOSIS — D0439 Carcinoma in situ of skin of other parts of face: Secondary | ICD-10-CM | POA: Diagnosis not present

## 2024-01-31 DIAGNOSIS — L821 Other seborrheic keratosis: Secondary | ICD-10-CM | POA: Diagnosis not present

## 2024-01-31 DIAGNOSIS — L84 Corns and callosities: Secondary | ICD-10-CM | POA: Diagnosis not present

## 2024-01-31 DIAGNOSIS — Z85828 Personal history of other malignant neoplasm of skin: Secondary | ICD-10-CM | POA: Diagnosis not present

## 2024-01-31 DIAGNOSIS — L570 Actinic keratosis: Secondary | ICD-10-CM | POA: Diagnosis not present

## 2024-01-31 DIAGNOSIS — D1724 Benign lipomatous neoplasm of skin and subcutaneous tissue of left leg: Secondary | ICD-10-CM | POA: Diagnosis not present

## 2024-02-10 ENCOUNTER — Encounter: Payer: Self-pay | Admitting: Family Medicine

## 2024-02-10 ENCOUNTER — Other Ambulatory Visit (HOSPITAL_BASED_OUTPATIENT_CLINIC_OR_DEPARTMENT_OTHER): Payer: Self-pay

## 2024-02-10 ENCOUNTER — Ambulatory Visit (INDEPENDENT_AMBULATORY_CARE_PROVIDER_SITE_OTHER): Admitting: Family Medicine

## 2024-02-10 VITALS — BP 126/60 | HR 60 | Ht 59.0 in | Wt 139.0 lb

## 2024-02-10 DIAGNOSIS — M722 Plantar fascial fibromatosis: Secondary | ICD-10-CM | POA: Diagnosis not present

## 2024-02-10 MED ORDER — DICLOFENAC SODIUM 1 % EX GEL
2.0000 g | Freq: Four times a day (QID) | CUTANEOUS | 11 refills | Status: AC
  Filled 2024-02-10: qty 100, 30d supply, fill #0

## 2024-02-10 NOTE — Progress Notes (Signed)
 Acute Office Visit  Subjective:     Patient ID: Sophia [name], female    DOB: 06-10-1941, 83 y.o.   MRN: [id number]  Chief Complaint  Patient presents with   Foot Pain    Foot Pain   Patient is in today for bilateral foot pain L>R.  Discussed the use of AI scribe software for clinical note transcription with the patient, who gave verbal consent to proceed.  History of Present Illness Sophia [name] is an 83 year old female who presents with bilateral foot/heel pain.  She has been experiencing tenderness in both heels for the past couple of weeks. Initially, the pain was mild and occurred when she first stood up, easing after walking for a while. However, the pain has intensified over the last three days, making it difficult for her to walk without support. She describes the pain as feeling like 'a big area' and notes it is very tender on the bottom of the foot; right foot has improved, but left seems to be worsening. The pain is causing her to limp and is affecting her hip and back.  She mentions being more active recently, engaging in a lot of walking and shopping, which is typical for her but has not previously caused issues. She does not wear any support in her shoes and has a high arch. She has not noticed significant swelling, but there might be a little. She has not experienced similar symptoms before.  She is currently on a blood thinner, which limits her use of certain medications. She has not used Voltaren  gel before and does not have any at home.  In terms of symptom management, applying cold helps alleviate the pain slightly, especially after walking. The pain does not bother her when she is in bed or resting, but it is pronounced when she stands. She is cautious about going barefoot and typically wears shoes or sandals at home.        ROS All review of systems negative except what is listed in the HPI      Objective:    BP 126/60   Pulse 60    Ht 4' 11 (1.499 m)   Wt 139 lb (63 kg)   SpO2 98%   BMI 28.07 kg/m    Physical Exam Vitals reviewed.  Constitutional:      Appearance: Normal appearance.  Feet:     Comments: Left heel mildly inflamed and entire planta fascia tender to palpation; no skin changes Skin:    General: Skin is warm and dry.     Findings: No bruising or erythema.  Neurological:     Mental Status: She is alert and oriented to person, place, and time.  Psychiatric:        Mood and Affect: Mood normal.        Behavior: Behavior normal.        Thought Content: Thought content normal.        Judgment: Judgment normal.     No results found for any visits on 02/10/24.      Assessment & Plan:   Problem List Items Addressed This Visit   None Visit Diagnoses       Plantar fasciitis, bilateral    -  Primary   Relevant Medications   diclofenac  Sodium (VOLTAREN ) 1 % GEL       Assessment & Plan Plantar fasciitis, bilateral, left worse than right Heel and plantar fascia tenderness and pain suggest plantar fasciitis (left worse  than right), exacerbated over three days, causing limping and secondary hip and back pain. Differential diagnosis considered, but symptoms are classic for plantar fasciitis. Oral anti-inflammatories not recommended due to blood thinner use. - Prescribe Voltaren  gel. Advise over-the-counter purchase if not covered by insurance. - Recommend supportive footwear with arch supports, avoid going barefoot. - Provide exercises and stretches for home therapy. - Suggest using a frozen water bottle for icing and foot massage to reduce inflammation. - Advise rest and gentle stretching. Handout provided. - Consider physical therapy referral if no improvement  - dry needling might be helpful. - Discuss potential sports medicine referral for steroid injection if not improving with conservative measures.      Meds ordered this encounter  Medications   diclofenac  Sodium (VOLTAREN ) 1 % GEL     Sig: Apply 2 g topically 4 (four) times daily. To affected joint.    Dispense:  100 g    Refill:  11    Supervising Provider:   DOMENICA BLACKBIRD A [[id number]]    Return if symptoms worsen or fail to improve.  Waddell KATHEE Mon, NP

## 2024-02-11 ENCOUNTER — Other Ambulatory Visit (HOSPITAL_COMMUNITY): Payer: Self-pay

## 2024-02-21 DIAGNOSIS — H5203 Hypermetropia, bilateral: Secondary | ICD-10-CM | POA: Diagnosis not present

## 2024-02-21 DIAGNOSIS — H2513 Age-related nuclear cataract, bilateral: Secondary | ICD-10-CM | POA: Diagnosis not present

## 2024-02-21 DIAGNOSIS — H25013 Cortical age-related cataract, bilateral: Secondary | ICD-10-CM | POA: Diagnosis not present

## 2024-02-22 ENCOUNTER — Ambulatory Visit: Payer: Self-pay

## 2024-02-22 ENCOUNTER — Encounter: Payer: Self-pay | Admitting: Family Medicine

## 2024-02-22 ENCOUNTER — Telehealth: Payer: Self-pay | Admitting: Neurology

## 2024-02-22 DIAGNOSIS — M79672 Pain in left foot: Secondary | ICD-10-CM

## 2024-02-22 NOTE — Telephone Encounter (Signed)
 Copied from CRM [id number]. Topic: General - Other >> Feb 21, 2024  9:15 AM Burnard DEL wrote: Reason for CRM: Patient was seen on 8/14 for heel pain with Waddell Mon.She was told if it doesn't get better for her to call in and get an xray,and get set up to see the sports medicine doctor.She would ike to know if orders could be placed for the xray,she is still experiencing some pain in her heel. >> Feb 21, 2024  4:28 PM Thersia C wrote: Patient called in wanted to followup regarding this, would like a callback as soon as possible

## 2024-02-22 NOTE — Telephone Encounter (Signed)
           FYI Only or Action Required?: Action required by provider: Request for x-ray order.  Patient was last seen in primary care on 02/10/2024 by Almarie Waddell NOVAK, NP.  Called Nurse Triage reporting Foot Pain.  Symptoms began about 2 weeks ago.  Interventions attempted: Prescription medications: Voltaren  gel.  Symptoms are: gradually worsening.  Triage Disposition: See PCP When Office is Open (Within 3 Days)  Patient/caregiver understands and will follow disposition?: No, wishes to speak with PCPCopied from CRM #[id number]. Topic: Clinical - Red Word Triage >> Feb 22, 2024  2:53 PM Berneda FALCON wrote: Red Word that prompted transfer to Nurse Triage: Daughter [name] states that the pt saw the Dr last week but was told that if it gets worse to call back and get an Xray. Called Monday to let them know it is worse and did not yet hear back.   Patient has swelling and pain in the left heel and cannot walk easily. Patient gets worried about things like this and did not speak up about the pain in the appt but but did make an attempt to call back on Monday to see if she needs Xray or sports med, what she should do next because she is in worse pain.         Reason for Disposition  [1] MODERATE pain (e.g., interferes with normal activities, limping) AND [2] present > 3 days  Answer Assessment - Initial Assessment Questions Patient's daughter called stating that they were told during their last visit to call back for an x-ray if the patient's left heel pain did not improve. She states that she sent a message yesterday about this and is upset that she has not heard back. I offered an appointment but she declined stating that she only wanted to get an order for an x-ray as the patient was seen for this already.        1. ONSET: When did the pain start?      About 2 weeks ago  2. LOCATION: Where is the pain located?      Left heel  3. PAIN: How bad is the pain?    (Scale 1-10; or  mild, moderate, severe)     Moderate  4. WORK OR EXERCISE: Has there been any recent work or exercise that involved this part of the body?      No 5. CAUSE: What do you think is causing the foot pain?     Unsure  6. OTHER SYMPTOMS: Do you have any other symptoms? (e.g., leg pain, rash, fever, numbness)     Some swelling of left ankle  Protocols used: Foot Pain-A-AH

## 2024-02-23 ENCOUNTER — Ambulatory Visit (HOSPITAL_BASED_OUTPATIENT_CLINIC_OR_DEPARTMENT_OTHER)
Admission: RE | Admit: 2024-02-23 | Discharge: 2024-02-23 | Disposition: A | Discharge status: Home / Self Care | Source: Ambulatory Visit | Attending: Family Medicine | Admitting: Family Medicine

## 2024-02-23 DIAGNOSIS — M79672 Pain in left foot: Secondary | ICD-10-CM | POA: Insufficient documentation

## 2024-02-23 DIAGNOSIS — M19072 Primary osteoarthritis, left ankle and foot: Secondary | ICD-10-CM | POA: Diagnosis not present

## 2024-02-23 DIAGNOSIS — M7662 Achilles tendinitis, left leg: Secondary | ICD-10-CM | POA: Diagnosis not present

## 2024-02-23 DIAGNOSIS — M7732 Calcaneal spur, left foot: Secondary | ICD-10-CM | POA: Diagnosis not present

## 2024-02-23 NOTE — Telephone Encounter (Signed)
 Patient also sent mychart message. Replied to her in that message letting her know these have been ordered.

## 2024-02-29 ENCOUNTER — Ambulatory Visit: Payer: Self-pay | Admitting: Family Medicine

## 2024-02-29 ENCOUNTER — Ambulatory Visit

## 2024-02-29 VITALS — BP 120/82 | Ht 59.0 in | Wt 139.0 lb

## 2024-02-29 DIAGNOSIS — M722 Plantar fascial fibromatosis: Secondary | ICD-10-CM | POA: Diagnosis not present

## 2024-02-29 NOTE — Progress Notes (Signed)
   Subjective:    Patient ID: Sophia [name], female    DOB: 83 y.o., Aug 03, 1940   MRN: [id number]  HPI  Chief Complaint: Left foot pain  Past 5 weeks No prior significant injury Did have increased activity walking/standing/doing chores around her daughter's house while they were away on a trip and that seemed to kick this off Has started taking Voltaren  gel but no other medicines on board. Never had this problem before No numbness or tingling extending into the foot that is new PCP told her that she bruises likely PF  Objective:   Physical Exam Vitals:   02/29/24 1130  BP: 120/82    Left ankle ( compared to normal ) Inspection: no swelling, accentuated longitudinal arch.  Slight overlap of the second digit of the left foot above the first with plantarflexion.  Palpation: TTP - ATFL, - CFL, - PTFL, - anterior joint line, - navicular, - base of 5th metatarsal, - deltoid, -  tarsal, - metatarsal, - achilles, ++ plantar fascia AROM/PROM: Full plantarflexion, dorsiflexion, eversion, inversion.  Great toe range of motion limited in plantarflexion (~15 degrees only). Strength: 5/5 plantarflexion, 5/5 dorsiflexion, 5/5 eversion, 5/5 inversion Special tests: - talar tilt test, - anterior drawer, - proximal squeeze, - calcaneal squeeze. + Windlass test.  Three-view plain radiographs obtained of the left foot today from independent review revealing osteophytic changes noted at the inferior calcaneus.  Degenerative changes noted through the midfoot.  Pes cavus.  Hammering of 1st through 5th digits of foot.  No acute osseous abnormalities.    Assessment & Plan:   Sophia [name] is a 83 y.o. presenting with left foot pain most consistent with plantar fasciitis.  Extensive discussion had with patient regarding the nature of this diagnosis, therapeutic options including shoe inserts, shockwave therapy, injections, physical therapy, topical anti-inflammatories, oral anti-inflammatories.  Due to several  chronic conditions patient has which put her at high risk for side effects from NSAIDs including blood thinner use, we will forego this at this time.  Encouraged her to increase water adobes to 4 times daily.  Provided heel lifts.  Patient stating not able to financially justify $70 treatments for shockwave at this time.  May consider in the future though.  Recommend follow-up in 6 weeks.

## 2024-03-28 DIAGNOSIS — M5459 Other low back pain: Secondary | ICD-10-CM | POA: Diagnosis not present

## 2024-03-28 DIAGNOSIS — M533 Sacrococcygeal disorders, not elsewhere classified: Secondary | ICD-10-CM | POA: Diagnosis not present

## 2024-04-03 DIAGNOSIS — D0439 Carcinoma in situ of skin of other parts of face: Secondary | ICD-10-CM | POA: Diagnosis not present

## 2024-04-10 ENCOUNTER — Other Ambulatory Visit: Payer: Self-pay | Admitting: Family Medicine

## 2024-04-10 ENCOUNTER — Ambulatory Visit

## 2024-04-13 ENCOUNTER — Ambulatory Visit: Payer: Self-pay

## 2024-04-13 NOTE — Telephone Encounter (Signed)
 Patient called to discuss her symptoms. Patient reports nasal congestion and sore throat. Patient reports symptoms have been going on for a week. Patient states she doesn't think she needs to be seen in the office but would like recommendations from provider.   FYI Only or Action Required?: Action required by provider: clinical question for provider.  Patient was last seen in primary care on 02/10/2024 by Almarie Waddell NOVAK, NP.  Called Nurse Triage reporting Cough and Nasal Congestion.  Symptoms began a week ago.  Interventions attempted: Rest, hydration, or home remedies.  Symptoms are: unchanged.  Triage Disposition: See PCP When Office is Open (Within 3 Days)  Patient/caregiver understands and will follow disposition?: No, wishes to speak with PCP  Copied from CRM #[id number]. Topic: Clinical - Medical Advice >> Apr 13, 2024  8:54 AM Herma G wrote: Reason for CRM: Pt's daughter requested a call back from one of Dr. Elisabeth nurses to discuss pt experiencing symptoms that started as a bad sore throat accompanied by nose being really stuffed up. >> Apr 13, 2024  3:16 PM Tiffany B wrote: Patient experiencing a bad sore throat, nose is stopped up and swollen. Caller states patient is on several heart medications and caller would like to know what medications patient can take for her symptoms.  >> Apr 13, 2024 10:15 AM CMA Lila BROCKS wrote: Error CRM sent. Appt should have been offered or at least offered to let her speak w/ nurse triage?  Reason for Disposition  [1] Sinus congestion (pressure, fullness) AND [2] present > 10 days  Answer Assessment - Initial Assessment Questions 1. ONSET: When did the nasal discharge start?      About a week ago 2. AMOUNT: How much discharge is there?      Moderate discharge 3. COUGH: Do you have a cough? If Yes, ask: Describe the color of your mucus. (e.g., clear, white, yellow, green)     Yes-yellow at times 4. RESPIRATORY DISTRESS: Describe your  breathing.      Breathing is regular 5. FEVER: Do you have a fever? If Yes, ask: What is your temperature, how was it measured, and when did it start?     no 6. SEVERITY: Overall, how bad are you feeling right now? (e.g., doesn't interfere with normal activities, staying home from school/work, staying in bed)      Mild to moderate 7. OTHER SYMPTOMS: Do you have any other symptoms? (e.g., earache, mouth sores, sore throat, wheezing)     Sore throat and congestion  Protocols used: Common Cold-A-AH

## 2024-04-27 ENCOUNTER — Other Ambulatory Visit: Payer: Self-pay | Admitting: Cardiovascular Disease

## 2024-04-27 DIAGNOSIS — I480 Paroxysmal atrial fibrillation: Secondary | ICD-10-CM

## 2024-04-27 NOTE — Telephone Encounter (Signed)
 Prescription refill request for Eliquis  received. Indication:afib Last office visit: 2/25 Scr:0.84  4/25 Age: 83 Weight:63  kg  Prescription refilled

## 2024-05-15 ENCOUNTER — Telehealth: Payer: Self-pay | Admitting: Cardiovascular Disease

## 2024-05-15 NOTE — Telephone Encounter (Signed)
 Patient c/o Palpitations:  STAT if patient reporting lightheadedness, shortness of breath, or chest pain   How long have you had palpitations/irregular HR/ Afib? Are you having the symptoms now? Since yesterday    Are you currently experiencing lightheadedness, SOB or CP? Tightness in chest    Do you have a history of afib (atrial fibrillation) or irregular heart rhythm? Yes    Have you checked your BP or HR? (document readings if available): 102/59 BP 69 BP    Are you experiencing any other symptoms? Tightness in chest    I spoke with the patient and she stated that her Cardia monitor is saying she is in and out of Afib. She says she is feeling okay besides the fatigue and chest discomfort. Her blood pressure is 102/59 and she is saying she feels weak. I spoke with Dr. Ranee nurse and we agreed that the patient is stable but if her symptoms increase to go to the ED. I gave those ED precautions and advise for her to hydrate, rest and monitor her symptoms. We also were able to schedule her to see Dr. Court tomorrow 11/18 at 2 pm. The patient agreed and we will go from there on how to proceed with this Afib she is feeling.

## 2024-05-15 NOTE — Telephone Encounter (Signed)
 Patient c/o Palpitations:  STAT if patient reporting lightheadedness, shortness of breath, or chest pain  How long have you had palpitations/irregular HR/ Afib? Are you having the symptoms now? Since yesterday   Are you currently experiencing lightheadedness, SOB or CP? Tightness in chest   Do you have a history of afib (atrial fibrillation) or irregular heart rhythm? Yes   Have you checked your BP or HR? (document readings if available): 102/59 BP 69 BP   Are you experiencing any other symptoms? Tightness in chest  Call transferred.

## 2024-05-16 ENCOUNTER — Ambulatory Visit: Attending: Cardiovascular Disease | Admitting: Cardiovascular Disease

## 2024-05-16 ENCOUNTER — Encounter: Payer: Self-pay | Admitting: Cardiovascular Disease

## 2024-05-16 VITALS — BP 160/82 | HR 61 | Ht 59.0 in | Wt 141.0 lb

## 2024-05-16 DIAGNOSIS — I10 Essential (primary) hypertension: Secondary | ICD-10-CM

## 2024-05-16 DIAGNOSIS — E782 Mixed hyperlipidemia: Secondary | ICD-10-CM

## 2024-05-16 DIAGNOSIS — I480 Paroxysmal atrial fibrillation: Secondary | ICD-10-CM

## 2024-05-16 DIAGNOSIS — R931 Abnormal findings on diagnostic imaging of heart and coronary circulation: Secondary | ICD-10-CM | POA: Diagnosis not present

## 2024-05-16 NOTE — Progress Notes (Signed)
 05/16/2024 TASMIA BLUMER   1941-06-28  [id number]  Primary Physician Domenica Harlene LABOR, MD Primary Cardiologist: Dorn JINNY Lesches MD FACP, [place], [place], FSCAI  HPI:  Sophia [name] is a 83 y.o.    mild to moderately overweight divorced Caucasian female mother of 3 children, grandmother of 5 grandchildren who I last saw in the office 08/04/2023.  She is accompanied by her daughter Hart today.  She was referred by the ER for evaluation of chest pain.  Her primary care provider is Dr. Harlene Heather .   Risk factors include hyperlipidemia intolerant to statin therapy and family history with mother who had a myocardial infarction at age 30 as well as extensive vascular disease.  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  She is never had a heart attack or stroke.  She has had palpitations evaluated by cardiologist in [place]  7 years ago which time a work-up included stress testing and event monitoring.  She was placed on carvedilol  at that time.  She was seen in the emergency room at [facility] on 02/22/2018 with chest pain and palpitations.  Pain began in the early morning hours and got worse.  She thought it was indigestion.  She did feel increasing palpitations as well as shortness of breath.  She says she can also hear her heartbeat in her left ear.  Her troponins were negative and her EKG showed no acute changes at that time.   When I saw her in the office on 11/08/2019 she was in A. fib with a ventricular response of 106.  I did add Cardizem  CD 120 mg a day.  She was already on Eliquis .  I referred her to the A. fib clinic who saw her on 11/13/2019.  She had converted spontaneously.     She  did well until a Saturday morning back in September when she was awakened with chest pressure and palpitations similar to her prior episodes of PAF.  She is in atrial fibrillation with a controlled ventricular response today in the office.  She says she  still has some vague chest pressure.  She has been on Eliquis  uninterrupted.  I am referring her to the A-fib clinic to arrange outpatient DC cardioversion in the very near future.  I did get a coronary calcium  score on her 01/24/2020 which was 304.  Her last functional study 03/09/2018 was normal.   I referred her to the A-fib clinic who arranged for her to undergo outpatient DC cardioversion by Dr. Shelda Bruckner 03/27/2022 successfully back to sinus rhythm.  She does feel clinically improved.  Because of occasional chest pain she had a Myoview  stress test performed 05/01/2022 which was entirely normal.   Since I saw her in the office 9 months ago she did have a steroid injection in her back because of osteoarthritis 05/04/2024.  She went into A-fib 2 days ago and converted yesterday.  She was mildly symptomatic while in A-fib.  She remains on Eliquis  oral anticoagulation.  Otherwise she continues to exercise but not as much as in the past.  She denies chest pain or shortness of breath.   Current Meds  Medication Sig   acetaminophen (TYLENOL) 500 MG tablet Take 1,000 mg by mouth 2 (two) times daily as needed for moderate pain (pain score 4-6) or headache.   Calcium  Carb-Cholecalciferol (CALCIUM  600 + D PO) Take 1 tablet by mouth daily.   carvedilol  (COREG ) 25  MG tablet TAKE 1 TABLET(25 MG) BY MOUTH TWICE DAILY WITH A MEAL   Cholecalciferol (VITAMIN D ) 50 MCG (2000 UT) tablet Take 1 tablet (2,000 Units total) by mouth daily.   Co-Enzyme Q10 200 MG CAPS Take 200 mg by mouth daily.   diltiazem  (CARDIZEM  CD) 240 MG 24 hr capsule TAKE 1 CAPSULE(240 MG) BY MOUTH DAILY   ELIQUIS  5 MG TABS tablet TAKE 1 TABLET(5 MG) BY MOUTH TWICE DAILY   estradiol  (ESTRACE ) 0.1 MG/GM vaginal cream Place 1 Applicatorful vaginally 2 (two) times a week.   famotidine  (PEPCID ) 20 MG tablet Take 1 tablet (20 mg total) by mouth 2 (two) times daily as needed for heartburn or indigestion.   Hypromellose (ARTIFICIAL TEARS OP)  Place 1 drop into both eyes as needed (dry eyes).   Probiotic Product (PROBIOTIC DAILY PO) Take 1 capsule by mouth daily.    REPATHA  SURECLICK 140 MG/ML SOAJ INJECT 1 DOSE INTO SKIN AS DIRECTED FOR 14 DAYS     Allergies  Allergen Reactions   Morphine And Codeine Swelling   Statins Other (See Comments)    Myalgias, weakness (atorvastatin, RYR, rosuvastatin , pravastatin)   Sulfa Antibiotics Swelling and Rash    Social History   Socioeconomic History   Marital status: Single    Spouse name: Not on file   Number of children: 3   Years of education: Not on file   Highest education level: Not on file  Occupational History   Occupation: retired  Tobacco Use   Smoking status: Never   Smokeless tobacco: Never   Tobacco comments:    Never smoke 03/18/22  Vaping Use   Vaping status: Never Used  Substance and Sexual Activity   Alcohol use: No    Alcohol/week: 0.0 standard drinks of alcohol   Drug use: No   Sexual activity: Never    Comment: divorced, widowed, lives with elderly parents, is the caregiver, no dietary restrictions.   Other Topics Concern   Not on file  Social History Narrative   Not on file   Social Drivers of Health   Financial Resource Strain: Low Risk  (09/07/2023)   Overall Financial Resource Strain (CARDIA)    Difficulty of Paying Living Expenses: Not hard at all  Food Insecurity: No Food Insecurity (09/07/2023)   Hunger Vital Sign    Worried About Running Out of Food in the Last Year: Never true    Ran Out of Food in the Last Year: Never true  Transportation Needs: No Transportation Needs (09/07/2023)   PRAPARE - Administrator, Civil Service (Medical): No    Lack of Transportation (Non-Medical): No  Physical Activity: Insufficiently Active (09/07/2023)   Exercise Vital Sign    Days of Exercise per Week: 3 days    Minutes of Exercise per Session: 20 min  Stress: No Stress Concern Present (09/07/2023)   [employer] of Occupational Health -  Occupational Stress Questionnaire    Feeling of Stress : Not at all  Social Connections: Moderately Integrated (09/07/2023)   Social Connection and Isolation Panel    Frequency of Communication with Friends and Family: More than three times a week    Frequency of Social Gatherings with Friends and Family: More than three times a week    Attends Religious Services: More than 4 times per year    Active Member of [employer] or Organizations: Yes    Attends Engineer, Structural: More than 4 times per year    Marital Status: Divorced  Intimate Partner Violence: Not At Risk (09/07/2023)   Humiliation, Afraid, Rape, and Kick questionnaire    Fear of Current or Ex-Partner: No    Emotionally Abused: No    Physically Abused: No    Sexually Abused: No     Review of Systems: General: negative for chills, fever, night sweats or weight changes.  Cardiovascular: negative for chest pain, dyspnea on exertion, edema, orthopnea, palpitations, paroxysmal nocturnal dyspnea or shortness of breath Dermatological: negative for rash Respiratory: negative for cough or wheezing Urologic: negative for hematuria Abdominal: negative for nausea, vomiting, diarrhea, bright red blood per rectum, melena, or hematemesis Neurologic: negative for visual changes, syncope, or dizziness All other systems reviewed and are otherwise negative except as noted above.    Blood pressure (!) 160/82, pulse 61, height 4' 11 (1.499 m), weight 141 lb (64 kg), SpO2 99%.  General appearance: alert and no distress Neck: no adenopathy, no carotid bruit, no JVD, supple, symmetrical, trachea midline, and thyroid  not enlarged, symmetric, no tenderness/mass/nodules Lungs: clear to auscultation bilaterally Heart: regular rate and rhythm, S1, S2 normal, no murmur, click, rub or gallop Extremities: extremities normal, atraumatic, no cyanosis or edema Pulses: 2+ and symmetric Skin: Skin color, texture, turgor normal. No rashes or  lesions Neurologic: Grossly normal  EKG EKG Interpretation Date/Time:  Tuesday May 16 2024 14:07:13 EST Ventricular Rate:  61 PR Interval:  186 QRS Duration:  70 QT Interval:  434 QTC Calculation: 436 R Axis:   -1  Text Interpretation: Normal sinus rhythm Possible Inferior infarct (cited on or before 06-Apr-2022) When compared with ECG of 10-May-2023 15:20, Nonspecific T wave abnormality no longer evident in Anterior leads Confirmed by Court Carrier [phone]) on 05/16/2024 2:36:00 PM    ASSESSMENT AND PLAN:   Hypertension History of essential hypertension blood pressure measured today at 160/82.  She says that she takes her blood pressure at home it usually runs 127/67.  She says that she has whitecoat hypertension.  She is on carvedilol  and diltiazem .  Hyperlipidemia History of hyperlipidemia on Repatha  with lipid profile performed 12/14/2022 revealing total cholesterol 160, LDL 75 and HDL of 50.  Paroxysmal atrial fibrillation (HCC) History of PAF status post successful outpatient DC cardioversion by Dr. Lonni 03/27/2022.  She remained in sinus rhythm until 2 days ago when she went back in A-fib and she was mildly symptomatic.  She has remained on Eliquis .  She did get a steroid injection in her back back on 05/04/2024 which may have precipitated this.  She has spontaneously converted back to sinus rhythm.  Elevated coronary artery calcium  score Elevated coronary calcium  score 304 performed 01/24/2020 with a subsequent negative Myoview  stress test 05/01/2022.  She denies chest pain or shortness of breath.     Carrier DOROTHA Court MD FACP,FACC,FAHA, [facility] 05/16/2024 2:47 PM

## 2024-05-16 NOTE — Assessment & Plan Note (Signed)
 History of hyperlipidemia on Repatha  with lipid profile performed 12/14/2022 revealing total cholesterol 160, LDL 75 and HDL of 50.

## 2024-05-16 NOTE — Assessment & Plan Note (Signed)
 History of PAF status post successful outpatient DC cardioversion by Dr. Lonni 03/27/2022.  She remained in sinus rhythm until 2 days ago when she went back in A-fib and she was mildly symptomatic.  She has remained on Eliquis .  She did get a steroid injection in her back back on 05/04/2024 which may have precipitated this.  She has spontaneously converted back to sinus rhythm.

## 2024-05-16 NOTE — Assessment & Plan Note (Signed)
 History of essential hypertension blood pressure measured today at 160/82.  She says that she takes her blood pressure at home it usually runs 127/67.  She says that she has whitecoat hypertension.  She is on carvedilol  and diltiazem .

## 2024-05-16 NOTE — Assessment & Plan Note (Signed)
 Elevated coronary calcium  score 304 performed 01/24/2020 with a subsequent negative Myoview  stress test 05/01/2022.  She denies chest pain or shortness of breath.

## 2024-05-16 NOTE — Patient Instructions (Signed)
 Medication Instructions:  Your physician recommends that you continue on your current medications as directed. Please refer to the Current Medication list given to you today.  *If you need a refill on your cardiac medications before your next appointment, please call your pharmacy*   Follow-Up: At [facility], you and your health needs are our priority.  As part of our continuing mission to provide you with exceptional heart care, our providers are all part of one team.  This team includes your primary Cardiologist (physician) and Advanced Practice Providers or APPs (Physician Assistants and Nurse Practitioners) who all work together to provide you with the care you need, when you need it.  Your next appointment:   6 month(s)  Provider:   Jon Hails, PA-C, Callie Goodrich, PA-C, Kathleen Johnson, PA-C, Damien Braver, NP, or Katlyn West, NP        Then, Dorn Lesches, MD will plan to see you again in 12 month(s).

## 2024-05-17 ENCOUNTER — Ambulatory Visit: Admitting: Cardiovascular Disease

## 2024-05-21 ENCOUNTER — Other Ambulatory Visit: Payer: Self-pay | Admitting: Cardiovascular Disease

## 2024-05-21 DIAGNOSIS — I480 Paroxysmal atrial fibrillation: Secondary | ICD-10-CM

## 2024-07-09 ENCOUNTER — Other Ambulatory Visit: Payer: Self-pay | Admitting: Family Medicine

## 2024-08-02 ENCOUNTER — Ambulatory Visit: Payer: Self-pay

## 2024-08-02 ENCOUNTER — Other Ambulatory Visit: Payer: Self-pay | Admitting: Internal Medicine

## 2024-08-02 NOTE — Telephone Encounter (Signed)
 FYI Only or Action Required?: FYI only for provider: appointment scheduled on 2/6.  Patient was last seen in primary care on 02/10/2024 by Almarie Waddell NOVAK, NP.  Called Nurse Triage reporting Ankle Injury and Leg Pain.  Symptoms began several days ago.  Interventions attempted: OTC medications: Tylenol.  Symptoms are: unchanged.  Triage Disposition: See Physician Within 24 Hours  Patient/caregiver understands and will follow disposition?: No, refuses disposition   Monday morning slipped and fell indoor while going down some steps. Twisted right ankle and hit knee. Ankle with bruising. 2-3/10 pain in ankle only when moving a certain way or standing. Upper calf with some swelling and sore/tender moderate pain. Able to walk. No numbness. Pain has overall improved in ankle, upper calf pain unchanged. Declines appt tomorrow. Scheduled for Friday. Pt states she will call back to cancel if she feels better by then. Advised UC or ED for worsening symptoms.    Message from Nichols G sent at 08/02/2024  2:22 PM EST  Summary: red word triage : slip and fall   Reason for Triage: Patient fell the other day and her ankle is black and blue and causing her a lot of pain.         Reason for Disposition  [1] MODERATE pain (e.g., interferes with normal activities, limping) AND [2] high-risk adult (e.g., age > 60 years, osteoporosis, chronic steroid use)  Answer Assessment - Initial Assessment Questions 1. MECHANISM: How did the injury happen? (e.g., twisting injury, direct blow)      Slipped off some stiars  2. ONSET: When did the injury happen? (e.g., minutes, hours ago)      Monday  3. LOCATION: Where is the injury located?      Right ankle and right upper calf  4. APPEARANCE of INJURY: What does the injury look like?  (e.g., deformity of leg)     Small amount of swelling and bruising in right ankle. Upper right calf area with small amount of bruising  5. SEVERITY: Can you put weight  on that leg? Can you walk?      Able to walk  6. SIZE: For cuts, bruises, or swelling, ask: How large is it? (e.g., inches or centimeters)      Small amount of swelling and bruising in right ankle. Upper calf area with small amount of bruising.  7. PAIN: Is there pain? If Yes, ask: How bad is the pain?   What does it keep you from doing? (Scale 0-10; or none, mild, moderate, severe)      2-3/10 pain in ankle only when moving a certain way or standing. Moderate pain in upper calf.  9. OTHER SYMPTOMS: Do you have any other symptoms?      No  Protocols used: Leg Injury-A-AH

## 2024-08-03 NOTE — Progress Notes (Incomplete)
 "  Acute Office Visit  Subjective:  Patient ID: Sophia [name], female    DOB: September 11, 1940  Age: 84 y.o. MRN: [id number]  CC: No chief complaint on file.     HPI Sophia [name] is here for ankle injury and leg pain following a fall. She reports that on Monday morning she slipped and fell indoors while walking down stairs. She subsequently twisted her right ankle and hit her left knee, leaving her ankle bruised. She notes a 2-3/10 pain in ankle only when moving a certain way or standing, which has improved, but some unchanged moderate sore/tenderness with swelling in her upper calf. She is able to ambulate without issue and denies numbness.     Past Medical History:  Diagnosis Date   Anxiety and depression    Arthritis    hands, knees, etc   Bladder prolapse, female, acquired 09/16/2014   Bowen's disease    Chronic atrial fibrillation (HCC)    Diverticulitis    Diverticulosis    GERD (gastroesophageal reflux disease)    H/O hematuria    for several years, work only revealed kidney stone on right side   Hepatic cyst    Hiatal hernia    History of chicken pox    History of kidney stones    Hyperlipidemia    Hypertension    IBS (irritable bowel syndrome)    Leg cramps, sleep related 04/02/2016   Myalgia 04/25/2017   Osteopenia 06/26/2014   Osteoporosis 06/26/2014   Plantar wart of left foot 10/15/2016   Renal cyst    Right shoulder pain 04/23/2017   SCC (squamous cell carcinoma)    arms   Statin intolerance 09/11/2019    Past Surgical History:  Procedure Laterality Date   CARDIOVERSION N/A 03/27/2022   Procedure: CARDIOVERSION;  Surgeon: Lonni Slain, MD;  Location: [facility] END[facility];  Service: Cardiovascular;  Laterality: N/A;   CHOLECYSTECTOMY  06/30/2003   LOOP RECORDER INSERTION N/A 04/15/2018   Procedure: LOOP RECORDER INSERTION;  Surgeon: Francyne Headland, MD;  Location: [facility] INVASIVE CV LAB;  Service: Cardiovascular;  Laterality: N/A;   SKIN BIOPSY      multiple   SKIN CANCER EXCISION     forehead   TONSILLECTOMY     age 79    Family History  Problem Relation Age of Onset   Heart disease Mother        aortic stenosis   Hyperlipidemia Mother    Hypertension Mother    Heart attack Mother    Cancer Mother        BCC, SCC, skin   Hypertension Father    Cancer Father        SCC, BCC, skin   Stroke Brother    Heart disease Brother        arrythmia   Hypertension Brother    Hyperlipidemia Brother    Heart disease Maternal Grandfather        MI   Tuberculosis Paternal Grandmother    Cancer Paternal Grandfather        head and neck cancer, chewed tobacco   Hypertension Daughter    Colon polyps Daughter    Diverticulitis Daughter    Hyperlipidemia Daughter    Thyroid  disease Daughter    Asthma Daughter    Kidney Stones Daughter    Heart disease Son    Heart attack Son    Birth defects Maternal Uncle    Colon cancer Maternal Uncle    Crohn's disease Niece  Social History   Socioeconomic History   Marital status: Single    Spouse name: Not on file   Number of children: 3   Years of education: Not on file   Highest education level: Not on file  Occupational History   Occupation: retired  Tobacco Use   Smoking status: Never   Smokeless tobacco: Never   Tobacco comments:    Never smoke 03/18/22  Vaping Use   Vaping status: Never Used  Substance and Sexual Activity   Alcohol use: No    Alcohol/week: 0.0 standard drinks of alcohol   Drug use: No   Sexual activity: Never    Comment: divorced, widowed, lives with elderly parents, is the caregiver, no dietary restrictions.   Other Topics Concern   Not on file  Social History Narrative   Not on file   Social Drivers of Health   Tobacco Use: Low Risk (05/16/2024)   Patient History    Smoking Tobacco Use: Never    Smokeless Tobacco Use: Never    Passive Exposure: Not on file  Financial Resource Strain: Low Risk (09/07/2023)   Overall Financial Resource Strain  (CARDIA)    Difficulty of Paying Living Expenses: Not hard at all  Food Insecurity: No Food Insecurity (09/07/2023)   Hunger Vital Sign    Worried About Running Out of Food in the Last Year: Never true    Ran Out of Food in the Last Year: Never true  Transportation Needs: No Transportation Needs (09/07/2023)   PRAPARE - Administrator, Civil Service (Medical): No    Lack of Transportation (Non-Medical): No  Physical Activity: Insufficiently Active (09/07/2023)   Exercise Vital Sign    Days of Exercise per Week: 3 days    Minutes of Exercise per Session: 20 min  Stress: No Stress Concern Present (09/07/2023)   [employer] of Occupational Health - Occupational Stress Questionnaire    Feeling of Stress : Not at all  Social Connections: Moderately Integrated (09/07/2023)   Social Connection and Isolation Panel    Frequency of Communication with Friends and Family: More than three times a week    Frequency of Social Gatherings with Friends and Family: More than three times a week    Attends Religious Services: More than 4 times per year    Active Member of [employer] or Organizations: Yes    Attends Banker Meetings: More than 4 times per year    Marital Status: Divorced  Intimate Partner Violence: Not At Risk (09/07/2023)   Humiliation, Afraid, Rape, and Kick questionnaire    Fear of Current or Ex-Partner: No    Emotionally Abused: No    Physically Abused: No    Sexually Abused: No  Depression (PHQ2-9): Low Risk (09/07/2023)   Depression (PHQ2-9)    PHQ-2 Score: 0  Alcohol Screen: Low Risk (09/07/2023)   Alcohol Screen    Last Alcohol Screening Score (AUDIT): 0  Housing: Unknown (09/07/2023)   Housing Stability Vital Sign    Unable to Pay for Housing in the Last Year: No    Number of Times Moved in the Last Year: Not on file    Homeless in the Last Year: No  Utilities: Not At Risk (09/07/2023)   AHC Utilities    Threatened with loss of utilities: No  Health  Literacy: Adequate Health Literacy (09/07/2023)   B1300 Health Literacy    Frequency of need for help with medical instructions: Never    ROS All ROS negative  except what is listed in the HPI.   Objective:   Today's Vitals: There were no vitals taken for this visit.  Physical Exam  Assessment & Plan:   Problem List Items Addressed This Visit   None Visit Diagnoses       Fall, initial encounter    -  Primary     Acute right ankle pain             Follow-up: No follow-ups on file.   Waddell FURY Almarie, DNP, FNP-C  I,Emily Lagle,acting as a neurosurgeon for Waddell KATHEE Almarie, NP.,have documented all relevant documentation on the behalf of Waddell KATHEE Almarie, NP.   I, Waddell KATHEE Almarie, NP, have reviewed all documentation for this visit. The documentation on 08/04/2024 for the exam, diagnosis, procedures, and orders are all accurate and complete.  "

## 2024-08-04 ENCOUNTER — Ambulatory Visit: Admitting: Family Medicine

## 2024-08-04 ENCOUNTER — Ambulatory Visit (HOSPITAL_BASED_OUTPATIENT_CLINIC_OR_DEPARTMENT_OTHER): Admission: RE | Admit: 2024-08-04 | Source: Ambulatory Visit

## 2024-08-04 ENCOUNTER — Encounter: Payer: Self-pay | Admitting: Family Medicine

## 2024-08-04 ENCOUNTER — Ambulatory Visit: Payer: Self-pay | Admitting: Family Medicine

## 2024-08-04 VITALS — BP 121/67 | HR 65 | Temp 97.9°F | Ht 59.0 in | Wt 141.2 lb

## 2024-08-04 DIAGNOSIS — W19XXXA Unspecified fall, initial encounter: Secondary | ICD-10-CM

## 2024-08-04 DIAGNOSIS — M79604 Pain in right leg: Secondary | ICD-10-CM

## 2024-08-04 DIAGNOSIS — M25571 Pain in right ankle and joints of right foot: Secondary | ICD-10-CM

## 2024-08-04 NOTE — Addendum Note (Signed)
 Addended by: ALMARIE BIRMINGHAM B on: 08/04/2024 11:40 AM   Modules accepted: Orders

## 2024-09-12 ENCOUNTER — Ambulatory Visit

## 2025-06-07 ENCOUNTER — Encounter: Admitting: Family Medicine
